# Patient Record
Sex: Female | Born: 1970
Health system: Southern US, Community
[De-identification: ages and names within clinical notes are randomized; demographics above are authoritative.]

## PROBLEM LIST (undated history)

## (undated) DIAGNOSIS — R739 Hyperglycemia, unspecified: Secondary | ICD-10-CM

## (undated) DIAGNOSIS — R0602 Shortness of breath: Secondary | ICD-10-CM

## (undated) DIAGNOSIS — E119 Type 2 diabetes mellitus without complications: Secondary | ICD-10-CM

## (undated) DIAGNOSIS — I639 Cerebral infarction, unspecified: Secondary | ICD-10-CM

## (undated) DIAGNOSIS — K8020 Calculus of gallbladder without cholecystitis without obstruction: Secondary | ICD-10-CM

## (undated) DIAGNOSIS — I10 Essential (primary) hypertension: Secondary | ICD-10-CM

## (undated) DIAGNOSIS — R600 Localized edema: Secondary | ICD-10-CM

## (undated) DIAGNOSIS — R059 Cough, unspecified: Secondary | ICD-10-CM

## (undated) DIAGNOSIS — F17210 Nicotine dependence, cigarettes, uncomplicated: Secondary | ICD-10-CM

## (undated) DIAGNOSIS — E7800 Pure hypercholesterolemia, unspecified: Secondary | ICD-10-CM

## (undated) DIAGNOSIS — R05 Cough: Secondary | ICD-10-CM

## (undated) DIAGNOSIS — I509 Heart failure, unspecified: Secondary | ICD-10-CM

## (undated) DIAGNOSIS — K219 Gastro-esophageal reflux disease without esophagitis: Secondary | ICD-10-CM

## (undated) DIAGNOSIS — J449 Chronic obstructive pulmonary disease, unspecified: Secondary | ICD-10-CM

## (undated) HISTORY — DX: Localized edema: R60.0

## (undated) HISTORY — PX: TUBAL LIGATION: SHX77

## (undated) HISTORY — DX: Cerebral infarction, unspecified: I63.9

## (undated) HISTORY — DX: Heart failure, unspecified: I50.9

## (undated) HISTORY — DX: Shortness of breath: R06.02

## (undated) HISTORY — DX: Cough: R05

## (undated) HISTORY — DX: Hyperglycemia, unspecified: R73.9

## (undated) HISTORY — DX: Chronic obstructive pulmonary disease, unspecified: J44.9

## (undated) HISTORY — DX: Cough, unspecified: R05.9

---

## 2015-11-13 ENCOUNTER — Encounter (HOSPITAL_COMMUNITY): Payer: Self-pay

## 2015-11-13 ENCOUNTER — Emergency Department (HOSPITAL_COMMUNITY)
Admission: EM | Admit: 2015-11-13 | Discharge: 2015-11-13 | Disposition: A | Discharge status: Home / Self Care | Payer: Self-pay | Attending: Emergency Medicine | Admitting: Emergency Medicine

## 2015-11-13 DIAGNOSIS — J0100 Acute maxillary sinusitis, unspecified: Secondary | ICD-10-CM | POA: Insufficient documentation

## 2015-11-13 DIAGNOSIS — E119 Type 2 diabetes mellitus without complications: Secondary | ICD-10-CM | POA: Insufficient documentation

## 2015-11-13 DIAGNOSIS — I10 Essential (primary) hypertension: Secondary | ICD-10-CM | POA: Insufficient documentation

## 2015-11-13 DIAGNOSIS — F17200 Nicotine dependence, unspecified, uncomplicated: Secondary | ICD-10-CM | POA: Insufficient documentation

## 2015-11-13 HISTORY — DX: Essential (primary) hypertension: I10

## 2015-11-13 HISTORY — DX: Type 2 diabetes mellitus without complications: E11.9

## 2015-11-13 HISTORY — DX: Pure hypercholesterolemia, unspecified: E78.00

## 2015-11-13 LAB — RAPID STREP SCREEN (MED CTR MEBANE ONLY): Streptococcus, Group A Screen (Direct): NEGATIVE

## 2015-11-13 MED ORDER — ALBUTEROL SULFATE HFA 108 (90 BASE) MCG/ACT IN AERS
2.0000 | INHALATION_SPRAY | Freq: Once | RESPIRATORY_TRACT | Status: AC
  Administered 2015-11-13: 2 via RESPIRATORY_TRACT
  Filled 2015-11-13: qty 6.7

## 2015-11-13 MED ORDER — AMOXICILLIN-POT CLAVULANATE 875-125 MG PO TABS: 1.0000 | ORAL_TABLET | Freq: Two times a day (BID) | ORAL | Status: DC

## 2015-11-13 MED ORDER — BENZONATATE 100 MG PO CAPS: 100.0000 mg | ORAL_CAPSULE | Freq: Three times a day (TID) | ORAL | Status: DC | PRN

## 2015-11-13 MED ORDER — FLUTICASONE PROPIONATE 50 MCG/ACT NA SUSP: 2.0000 | Freq: Every day | NASAL | Status: DC

## 2015-11-13 NOTE — Progress Notes (Signed)
[facility] [address]

## 2015-11-13 NOTE — Progress Notes (Signed)
Entered in d/c instructions  [facility] Schedule an appointment as soon as possible for a visit [facility] [address] [place] [phone]

## 2015-11-13 NOTE — ED Notes (Signed)
Pt dx with strep x 2 weeks ago.  Pt here today with sore throat and nasal congestion starting again this weekend.

## 2015-11-13 NOTE — Progress Notes (Signed)
EPIC updated  [facility] [address] [place]  [phone]

## 2015-11-13 NOTE — Discharge Instructions (Signed)
Read the information below.  Use the prescribed medication as directed.  Please discuss all new medications with your pharmacist.  You may return to the Emergency Department at any time for worsening condition or any new symptoms that concern you.   If you develop high fevers that do not resolve with tylenol or ibuprofen, you have difficulty swallowing or breathing, or you are unable to tolerate fluids by mouth, return to the ER for a recheck.      Sinusitis, Adult Sinusitis is redness, soreness, and puffiness (inflammation) of the air pockets in the bones of your face (sinuses). The redness, soreness, and puffiness can cause air and mucus to get trapped in your sinuses. This can allow germs to grow and cause an infection.  HOME CARE   Drink enough fluids to keep your pee (urine) clear or pale yellow.  Use a humidifier in your home.  Run a hot shower to create steam in the bathroom. Sit in the bathroom with the door closed. Breathe in the steam 3-4 times a day.  Put a warm, moist washcloth on your face 3-4 times a day, or as told by your doctor.  Use salt water sprays (saline sprays) to wet the thick fluid in your nose. This can help the sinuses drain.  Only take medicine as told by your doctor. GET HELP RIGHT AWAY IF:   Your pain gets worse.  You have very bad headaches.  You are sick to your stomach (nauseous).  You throw up (vomit).  You are very sleepy (drowsy) all the time.  Your face is puffy (swollen).  Your vision changes.  You have a stiff neck.  You have trouble breathing. MAKE SURE YOU:   Understand these instructions.  Will watch your condition.  Will get help right away if you are not doing well or get worse.   This information is not intended to replace advice given to you by your health care provider. Make sure you discuss any questions you have with your health care provider.   Document Released: 03/11/2008 Document Revised: 10/14/2014 Document Reviewed:  04/28/2012 Elsevier Interactive Patient Education 2016 Elsevier Inc.  Upper Respiratory Infection, Adult Most upper respiratory infections (URIs) are caused by a virus. A URI affects the nose, throat, and upper air passages. The most common type of URI is often called "the common cold." HOME CARE   Take medicines only as told by your doctor.  Gargle warm saltwater or take cough drops to comfort your throat as told by your doctor.  Use a warm mist humidifier or inhale steam from a shower to increase air moisture. This may make it easier to breathe.  Drink enough fluid to keep your pee (urine) clear or pale yellow.  Eat soups and other clear broths.  Have a healthy diet.  Rest as needed.  Go back to work when your fever is gone or your doctor says it is okay.  You may need to stay home longer to avoid giving your URI to others.  You can also wear a face mask and wash your hands often to prevent spread of the virus.  Use your inhaler more if you have asthma.  Do not use any tobacco products, including cigarettes, chewing tobacco, or electronic cigarettes. If you need help quitting, ask your doctor. GET HELP IF:  You are getting worse, not better.  Your symptoms are not helped by medicine.  You have chills.  You are getting more short of breath.  You have  brown or red mucus.  You have yellow or brown discharge from your nose.  You have pain in your face, especially when you bend forward.  You have a fever.  You have puffy (swollen) neck glands.  You have pain while swallowing.  You have white areas in the back of your throat. GET HELP RIGHT AWAY IF:   You have very bad or constant:  Headache.  Ear pain.  Pain in your forehead, behind your eyes, and over your cheekbones (sinus pain).  Chest pain.  You have long-lasting (chronic) lung disease and any of the following:  Wheezing.  Long-lasting cough.  Coughing up blood.  A change in your usual  mucus.  You have a stiff neck.  You have changes in your:  Vision.  Hearing.  Thinking.  Mood. MAKE SURE YOU:   Understand these instructions.  Will watch your condition.  Will get help right away if you are not doing well or get worse.   This information is not intended to replace advice given to you by your health care provider. Make sure you discuss any questions you have with your health care provider.   Document Released: 03/11/2008 Document Revised: 02/07/2015 Document Reviewed: 12/29/2013 Elsevier Interactive Patient Education 2016 [employer].    Emergency Department Resource Guide 1) Find a Doctor and Pay Out of Pocket Although you won't have to find out who is covered by your insurance plan, it is a good idea to ask around and get recommendations. You will then need to call the office and see if the doctor you have chosen will accept you as a new patient and what types of options they offer for patients who are self-pay. Some doctors offer discounts or will set up payment plans for their patients who do not have insurance, but you will need to ask so you aren't surprised when you get to your appointment.  2) Contact Your Local Health Department Not all health departments have doctors that can see patients for sick visits, but many do, so it is worth a call to see if yours does. If you don't know where your local health department is, you can check in your phone book. The CDC also has a tool to help you locate your state's health department, and many state websites also have listings of all of their local health departments.  3) Find a Walk-in Clinic If your illness is not likely to be very severe or complicated, you may want to try a walk in clinic. These are popping up all over the country in pharmacies, drugstores, and shopping centers. They're usually staffed by nurse practitioners or physician assistants that have been trained to treat common illnesses and  complaints. They're usually fairly quick and inexpensive. However, if you have serious medical issues or chronic medical problems, these are probably not your best option.  No Primary Care Doctor: - Call Health Connect at  [phone] - they can help you locate a primary care doctor that  accepts your insurance, provides certain services, etc. - Physician Referral Service- [phone]  Chronic Pain Problems: Organization         Address  Phone   Notes  [facility]  [phone] Patients need to be referred by their primary care doctor.   Medication Assistance: Organization         Address  Phone   Notes  [facility] Medication [facility] [address] [place] [place], [place] [address] [phone] --  Must be a resident of [facility] -- Must have NO insurance coverage whatsoever (no Medicaid/ Medicare, etc.) -- The pt. MUST have a primary care doctor that directs their care regularly and follows them in the community   [facility]  [phone]   [employer]  [phone]    Agencies that provide inexpensive medical care: Organization         Address  Phone   Notes  [facility]  [phone]   [facility]    [phone]   [facility] [address] [place], [place] [address] [phone]   [facility] [address] [place] [place] [phone]   Planned Parenthood    [phone]   [facility]    [phone]   [facility]  [address], [place] Phone:  [phone], Fax:  [phone] Hours of Operation:  9 am - 6 pm, M-F.  Also accepts Medicaid/Medicare and self-pay.  [facility]  [address], [place] Phone: [phone], Fax: [phone]. Hours of Operation:  8:30 am - 5:30 pm, M-F.  Also accepts Medicaid and self-pay.  [facility] [place] [address], [place] Phone: [phone]   [facility] [address] Natasha [place]ence [place], [place] [phone], Ext. [phone] Mondays & Thursdays: 7-9 AM.  First 15 patients are seen on a first come, first serve basis.    Medicaid-accepting [facility] Providers:  Organization         Address  Phone   Notes  [facility] [address], [place] [phone] Also accepts self-pay patients.  [facility] [address] Laurell Josephs [place], [place]  [phone]   [facility] [address], [place] [phone]   [facility] [address], [place] [phone]   Renaye Rakers [address], [place]   [phone] Only accepts [place] Access [place] patients after they have their name applied to their card.   Self-Pay (no insurance) in [facility]:  Organization         Address  Phone   Notes  Sickle Cell Patients, [facility] [address] [place], [place] [phone]   [facility] [address] [place], [place] [phone]   [facility] [place]  [address], [place] [phone]   [facility]/Dr. Osei-Bonsu  [address] [place] or [address], [place] [phone] Phone number for both [place] and [place] locations is the same.  [facility] [address], [place] [phone]   [facility] [address] [place] or [address] [phone]   [facility] [address], [place] [phone], phone; [phone], fax Sees patients 1st and 3rd Saturday of every month.  Must not qualify for public or private insurance (i.e. Medicaid, Medicare, [place] Health Choice, Veterans' Benefits)  Household income should be no more than 200% of the poverty level  The clinic cannot treat you if you are pregnant or think you are pregnant  Sexually transmitted diseases are not treated at the clinic.    Dental Care: Organization  Address  Phone  Notes  [facility] of [facility] [address] [place], [place] [phone] Accepts children up to age 26 who are enrolled in [place] or [place] Health Choice; pregnant women with a Medicaid card; and children who have applied for Medicaid or [place] Health Choice, but were declined, whose parents can pay a reduced fee at time of service.  [facility] Department of [facility]  [address], [place] [phone] Accepts children up to age 59 who are enrolled in [place] or [place] Health Choice; pregnant women with a Medicaid card; and children who have applied for Medicaid or [place] Health Choice, but were declined, whose parents can pay a reduced fee at time of service.  [facility]  [address] [place], [place] [phone] Patients are seen by appointment only. Walk-ins are not accepted. [facility] will see patients 14 years of age and older. Monday - Tuesday (8am-5pm) Most Wednesdays (8:30-5pm) $30 per visit, cash only  [facility] Adult Dental Access PROGRAM  [address], [facility] [phone] Patients are seen by appointment only. Walk-ins are not accepted. [facility] will see patients 44 years of age and older. One Wednesday Evening (Monthly: Volunteer Based).  $30 per visit, cash only  [employer]  [phone] for adults; Children under age 75, call Graduate Pediatric Dentistry at [phone]. Children aged 10-14, please call [phone] to request a pediatric application.  Dental services are provided in all areas of dental care including fillings, crowns and bridges, complete and partial dentures, implants, gum treatment, root canals, and extractions. Preventive care is  also provided. Treatment is provided to both adults and children. Patients are selected via a lottery and there is often a waiting list.   [facility] [address], [place]  [phone] [email or web address]   [facility] [address] [place] [address] [place], [place] [phone], Ext. [phone] Second and Fourth Thursday of each month, opens at 6:30 AM; Clinic ends at 9 AM.  Patients are seen on a first-come first-served basis, and a limited number are seen during each clinic.   [facility]  [address] Ether Griffins [place], [place] [phone]   Eligibility Requirements You must have lived in [place], [place], or [place] counties for at least the last three months.   You cannot be eligible for state or federal sponsored [place], including [employer], [place], or [employer].   You generally cannot be eligible for healthcare insurance through your employer.    How to apply: Eligibility screenings are held every Tuesday and Wednesday afternoon from 1:00 pm until 4:00 pm. You do not need an appointment for the interview!  [facility] [address] [place], [place] [phone]   [facility] Health Department  [phone]   [facility] Health Department  [phone]   [facility] Health Department  [phone]    Behavioral Health Resources in the Community: Intensive Outpatient Programs Organization         Address  Phone  Notes  [facility] [address] [place], [place] [phone]   [facility] Outpatient [address], [place], [place] [phone]   [facility] [address], [place], [place]  [phone]   [facility] Mental Health [address],  [place], [place] [phone] or [phone]   Substance Abuse Resources Organization  Address  Phone  Notes  [facility] Drug Services  [phone]   [facility]  [phone]   [facility]  [phone]   Floydene Flock  [phone]   Residential & Outpatient Substance Abuse Program  [phone]   Psychological Services Organization         Address  Phone  Notes  [facility]  [phone]   [facility] Services  [phone]   [facility] Mental Health [address] [place] [phone] or [phone]    Mobile Crisis Teams Organization         Address  Phone  Notes  [facility], Mobile Crisis Care Unit  [phone]   [facility]  [address] [place], [place] [phone]   Doristine Locks [address] [place] [phone]    Self-Help/Support Groups Organization         Address  Phone             Notes  [facility] [place] - variety of support groups  336- I7437963 Call for more information  Narcotics Anonymous (NA), [facility] [address], [employer] [place]  2 meetings at this location   Statistician         Address  Phone  Notes  [facility] [address],    [place]  [phone]   [facility]  [address] [place] [address], [place], [place] [phone]   [facility] [address] [place], [place] [phone] Admissions: 8am-3pm M-F  [facility] [address],    [place], [place] [phone]   [facility] [address] [place], [place], [place] [phone]   [facility] [address],  [place], [place] [phone]   [facility] - Intensive Outpatient [address] Laurell Josephs [address], [place], [place] [phone]   [facility] ([facility]iction [facility] [address] [place].,  [place], [place] [phone] or [phone]   [facility] ([facility]) [address] [place], [place] [phone] Accepts Medicaid  [facility] [address],  [place] [phone]  Substance Abuse/Addiction Treatment   [facility] Organization         Address  Phone  Notes  [facility]  [phone]   Angie Fava, PhD [address] Ervin Knack [place], [place]   [phone] or [phone]   [facility] Behavioral   [address] [place], [place] [phone]   [facility] [address] [place], [place] [phone] Insurance/Medicaid/sponsorship through [facility] and Families [address]                                    [place], [place] [phone] Therapy/tele-psych/case  [facility] [address] [place] [address], [place] [phone]    Dr. Lolly Mustache  [phone]   [facility]  [employer] [facility] Dept. 1) [address], [place] 2) [address], [place] 3)  [address], [place] [phone]  [phone]   [facility] Child Abuse Hotline [phone] or [phone] (After Hours)

## 2015-11-13 NOTE — ED Provider Notes (Signed)
CSN: [id number]     Arrival date & time 11/13/15  1403 History  By signing my name below, I, Misty Mccullough, attest that this documentation has been prepared under the direction and in the presence of Lucas Winograd, PA-C. Electronically Signed: Octavia Mccullough, ED Scribe. 11/13/2015. 4:00 PM.    Chief Complaint  Patient presents with  . Sore Throat  . Nasal Congestion      The history is provided by the patient. No language interpreter was used.   HPI Comments: Misty Mccullough is a 45 y.o. female who has a hx of HTN, DM, and hypercholesteremia presents to the Emergency Department complaining of constant, gradual worsening sore throat onset associated cough, sinus pressure, nasal congestion onset 9 days ago. She reports her cough in intermittent and she has difficulty breathing when she coughs excessively. She has been having sleeping at night due to cough. Pt reports she has been around her granddaughter who has been sick recently.  Pt was diagnosed with strep throat two weeks ago and reports feeling better until about 9 days ago.  Denies fever, chills, and body aches.  Past Medical History  Diagnosis Date  . Hypertension   . Diabetes mellitus without complication (HCC)   . Hypercholesteremia    Past Surgical History  Procedure Laterality Date  . Tubal ligation     History reviewed. No pertinent family history. Social History  Substance Use Topics  . Smoking status: Current Every Day Smoker  . Smokeless tobacco: None  . Alcohol Use: No   OB History    No data available     Review of Systems  Constitutional: Negative for fever and chills.  HENT: Positive for congestion, sinus pressure and sore throat.   Respiratory: Positive for cough.   Gastrointestinal: Negative for vomiting.  All other systems reviewed and are negative.     Allergies  Review of patient's allergies indicates no known allergies.  Home Medications   Prior to Admission medications   Not on File   Triage  vitals: BP 175/88 mmHg  Pulse 76  Temp(Src) 97.8 F (36.6 C) (Oral)  SpO2 95%  LMP 10/30/2015 Physical Exam  Constitutional: She appears well-developed and well-nourished. No distress.  HENT:  Head: Normocephalic and atraumatic.  Mouth/Throat: Oropharynx is clear and moist. No oropharyngeal exudate.  Erythema, bilateral maxillary sinus tenderness  Eyes: Conjunctivae are normal.  Neck: Neck supple.  Cardiovascular: Normal rate and regular rhythm.   Pulmonary/Chest: Effort normal and breath sounds normal. No respiratory distress. She has no wheezes. She has no rales.  Neurological: She is alert.  Skin: She is not diaphoretic.  Nursing note and vitals reviewed.   ED Course  Procedures  DIAGNOSTIC STUDIES: Oxygen Saturation is 95% on RA, adequate by my interpretation.  COORDINATION OF CARE:  3:57 PM Discussed treatment plan with pt at bedside and pt agreed to plan.  Labs Review Labs Reviewed  RAPID STREP SCREEN (NOT AT [facility])  CULTURE, GROUP A STREP [facility])    Imaging Review No results found. I have personally reviewed and evaluated these images and lab results as part of my medical decision-making.   EKG Interpretation None      MDM   Final diagnoses:  Acute maxillary sinusitis, recurrence not specified    Afebrile, nontoxic patient with constellation of symptoms suggestive of viral syndrome x 9 days with maxillary sinus pain and tenderness.  Pt is diabetic and blood sugars reported to be high.  D/C home with Augmentin, Tessalon, Flonase, PCP follow  up.  Discussed result, findings, treatment, and follow up  with patient.  Pt given return precautions.  Pt verbalizes understanding and agrees with plan.       I personally performed the services described in this documentation, which was scribed in my presence. The recorded information has been reviewed and is accurate.   Trixie Dredge, PA-C 11/13/15 1747  Arby Barrette, MD 11/20/15 [phone]

## 2015-11-16 LAB — CULTURE, GROUP A STREP (THRC)

## 2016-06-26 DIAGNOSIS — Z79899 Other long term (current) drug therapy: Secondary | ICD-10-CM | POA: Insufficient documentation

## 2016-06-26 DIAGNOSIS — E1143 Type 2 diabetes mellitus with diabetic autonomic (poly)neuropathy: Secondary | ICD-10-CM | POA: Insufficient documentation

## 2016-06-26 DIAGNOSIS — I10 Essential (primary) hypertension: Secondary | ICD-10-CM | POA: Insufficient documentation

## 2016-06-26 DIAGNOSIS — F17200 Nicotine dependence, unspecified, uncomplicated: Secondary | ICD-10-CM | POA: Insufficient documentation

## 2016-06-26 DIAGNOSIS — Z760 Encounter for issue of repeat prescription: Secondary | ICD-10-CM | POA: Insufficient documentation

## 2016-06-26 LAB — CBG MONITORING, ED: Glucose-Capillary: 201 mg/dL — ABNORMAL HIGH (ref 65–99)

## 2016-06-26 NOTE — ED Triage Notes (Signed)
Pt states that she is diabetic and has started to have neuropathy in her feet. CBG 201. Alert and oriented.

## 2016-06-27 ENCOUNTER — Emergency Department (HOSPITAL_COMMUNITY)
Admission: EM | Admit: 2016-06-27 | Discharge: 2016-06-27 | Disposition: A | Discharge status: Home / Self Care | Payer: Self-pay | Attending: Emergency Medicine | Admitting: Emergency Medicine

## 2016-06-27 ENCOUNTER — Emergency Department (HOSPITAL_COMMUNITY): Payer: Self-pay

## 2016-06-27 DIAGNOSIS — Z760 Encounter for issue of repeat prescription: Secondary | ICD-10-CM

## 2016-06-27 DIAGNOSIS — E0842 Diabetes mellitus due to underlying condition with diabetic polyneuropathy: Secondary | ICD-10-CM

## 2016-06-27 MED ORDER — CIPROFLOXACIN HCL 500 MG PO TABS: 500.0000 mg | ORAL_TABLET | Freq: Two times a day (BID) | ORAL | 0 refills | Status: DC

## 2016-06-27 MED ORDER — METFORMIN HCL 500 MG PO TABS: 500.0000 mg | ORAL_TABLET | Freq: Two times a day (BID) | ORAL | 0 refills | Status: DC

## 2016-06-27 MED ORDER — METFORMIN HCL 500 MG PO TABS
500.0000 mg | ORAL_TABLET | Freq: Once | ORAL | Status: AC
  Administered 2016-06-27: 500 mg via ORAL
  Filled 2016-06-27: qty 1

## 2016-06-27 NOTE — ED Notes (Signed)
Patient was not in room when nurse went to discharge the patient.

## 2016-06-27 NOTE — ED Notes (Signed)
Patient states she is a diabetic and that she thinks that she has neuropathy. Patient does not have any sores or wounds on feet. Patient feet is not swollen. She states that her feet feel like they are burning.

## 2016-06-27 NOTE — ED Provider Notes (Addendum)
[facility]-EMERGENCY DEPT Provider Note   CSN: [id number] Arrival date & time: 06/26/16  1928  By signing my name below, I, Suzan Slick. Elon Spanner, attest that this documentation has been prepared under the direction and in the presence of Greidys Deland, MD.  Electronically Signed: Suzan Slick. Elon Spanner, ED Scribe. 06/27/16. 1:14 AM.    History   Chief Complaint Chief Complaint  Patient presents with  . Foot Pain   The history is provided by the patient. No language interpreter was used.  Foot Pain  This is a chronic problem. The current episode started more than 1 week ago. The problem occurs constantly. The problem has been gradually worsening. Pertinent negatives include no chest pain. The symptoms are aggravated by walking and standing. Nothing relieves the symptoms. She has tried nothing for the symptoms.    HPI Comments: Misty Mccullough is a 45 y.o. female with a PMHx of uncontrolled DM, neuropathy, and HTN who presents to the Emergency Department complaining of constant, worsening R foot pain x 3-4 months. Pt denies any recent injury or trauma. Discomfort to foot is exacerbated with ambulation. No alleviating factors at this time. No OTC medications or home remedies attempted prior to arrival. No recent fever, chills, nausea, or vomiting. No numbness to foot. Pt states she originally followed with a foot doctor at the [facility] in [place] but has not been seen in 3 years. She states she recently moved to [facility] and has not established with a new provider yet.  PCP: PROVIDER NOT IN SYSTEM    Past Medical History:  Diagnosis Date  . Diabetes mellitus without complication (HCC)   . Hypercholesteremia   . Hypertension     There are no active problems to display for this patient.   Past Surgical History:  Procedure Laterality Date  . TUBAL LIGATION      OB History    No data available       Home Medications    Prior to Admission medications   Medication Sig Start Date End Date Taking?  Authorizing Provider  amoxicillin-clavulanate (AUGMENTIN) 875-125 MG tablet Take 1 tablet by mouth every 12 (twelve) hours. 11/13/15   Trixie Dredge, PA-C  benzonatate (TESSALON) 100 MG capsule Take 1 capsule (100 mg total) by mouth 3 (three) times daily as needed for cough. 11/13/15   Trixie Dredge, PA-C  fluticasone (FLONASE) 50 MCG/ACT nasal spray Place 2 sprays into both nostrils daily. 11/13/15   Trixie Dredge, PA-C    Family History No family history on file.  Social History Social History  Substance Use Topics  . Smoking status: Current Every Day Smoker  . Smokeless tobacco: Not on file  . Alcohol use No     Allergies   Review of patient's allergies indicates no known allergies.   Review of Systems Review of Systems  Constitutional: Negative for chills and fever.  Cardiovascular: Negative for chest pain.  Gastrointestinal: Negative for nausea and vomiting.  Musculoskeletal: Positive for arthralgias.  Psychiatric/Behavioral: Negative for confusion.  All other systems reviewed and are negative.    Physical Exam Updated Vital Signs BP 167/100 (BP Location: Right Arm)   Pulse 82   Temp 98.2 F (36.8 C) (Oral)   Resp 18   LMP 05/31/2016 (Approximate)   SpO2 100%   Physical Exam  Constitutional: She is oriented to person, place, and time. She appears well-developed and well-nourished.  HENT:  Head: Normocephalic.  Mouth/Throat: Oropharynx is clear and moist.  No lesion noted to mouth. Multiple caries  noted to mouth.  Eyes: EOM are normal. Pupils are equal, round, and reactive to light.  Neck: Normal range of motion.  Cardiovascular: Normal rate, regular rhythm, normal heart sounds and intact distal pulses.  Exam reveals no gallop and no friction rub.   No murmur heard. DP pulse intact to R foot. PT pulse intact to R foot.  Pulmonary/Chest: Effort normal and breath sounds normal. No respiratory distress.  Abdominal: Soft. Bowel sounds are normal. She exhibits no distension and  no mass. There is no tenderness. There is no rebound and no guarding.  Musculoskeletal: Normal range of motion.       Feet:  Darkened impression the exact size of a dime noted between the 4th and 5th metatarsal. No break in skin.  Neurological: She is alert and oriented to person, place, and time. She has normal reflexes.  Skin: Skin is warm and dry. Capillary refill takes less than 2 seconds.  Psychiatric: She has a normal mood and affect.  Nursing note and vitals reviewed.    ED Treatments / Results   DIAGNOSTIC STUDIES: Oxygen Saturation is 100% on RA, Normal by my interpretation.    COORDINATION OF CARE: 1:11 AM- Will give Metformin. Will order blood work and imaging. Discussed treatment plan with pt at bedside and pt agreed to plan.    Vitals:   06/26/16 1943 06/27/16 0038  BP: 167/100 164/88  Pulse: 82 80  Resp: 18 13  Temp: 98.2 F (36.8 C) 97.5 F (36.4 C)    Labs Results for orders placed or performed during the hospital encounter of 06/27/16  CBG monitoring, ED  Result Value Ref Range   Glucose-Capillary 201 (H) 65 - 99 mg/dL   Dg Foot Complete Right  Result Date: 06/27/2016 CLINICAL DATA:  Anterior foot pain for 4 months. EXAM: RIGHT FOOT COMPLETE - 3+ VIEW COMPARISON:  None. FINDINGS: There is no evidence of fracture or dislocation. There is no evidence of arthropathy or other focal bone abnormality. Soft tissues are unremarkable. 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 IMPRESSION: No acute bony abnormalities. Electronically Signed   By: Burman NievesWilliam  Stevens M.D.   On: 06/27/2016 02:20    Radiology No results found.  Procedures Procedures (including critical care time)  Medications Ordered in ED Medications - No data to display  Will refill metformin and cover patient with cipro given length of exposure to to coin on sole of the foot.  No ulcerations.  Will refer to new pmd and podiatry for ongoing diabetic foot care.    Initial Impression /  Assessment and Plan / ED Course  I have reviewed the triage vital signs and the nursing notes.  Pertinent labs & imaging results that were available during my care of the patient were reviewed by me and considered in my medical decision making (see chart for details).   All questions answered to patient's satisfaction. Based on history and exam patient has been appropriately medically screened and emergency conditions excluded. Patient is stable for discharge at this time. Follow up with your PMD for recheck in 2 days and strict return precautions given    Final Clinical Impressions(s) / ED Diagnoses   Final diagnoses:  None    New Prescriptions New Prescriptions   No medications on file     Mamye Bolds, MD 06/27/16 0254    Ashtyn Freilich, MD 06/27/16 [phone]

## 2016-08-12 ENCOUNTER — Encounter (HOSPITAL_COMMUNITY): Payer: Self-pay | Admitting: Emergency Medicine

## 2016-08-12 ENCOUNTER — Emergency Department (HOSPITAL_COMMUNITY): Payer: Self-pay

## 2016-08-12 DIAGNOSIS — J219 Acute bronchiolitis, unspecified: Secondary | ICD-10-CM | POA: Insufficient documentation

## 2016-08-12 DIAGNOSIS — Z8049 Family history of malignant neoplasm of other genital organs: Secondary | ICD-10-CM | POA: Insufficient documentation

## 2016-08-12 DIAGNOSIS — E119 Type 2 diabetes mellitus without complications: Secondary | ICD-10-CM | POA: Insufficient documentation

## 2016-08-12 DIAGNOSIS — M40204 Unspecified kyphosis, thoracic region: Secondary | ICD-10-CM | POA: Insufficient documentation

## 2016-08-12 DIAGNOSIS — Z8249 Family history of ischemic heart disease and other diseases of the circulatory system: Secondary | ICD-10-CM | POA: Insufficient documentation

## 2016-08-12 DIAGNOSIS — M549 Dorsalgia, unspecified: Secondary | ICD-10-CM | POA: Insufficient documentation

## 2016-08-12 DIAGNOSIS — F17210 Nicotine dependence, cigarettes, uncomplicated: Secondary | ICD-10-CM | POA: Insufficient documentation

## 2016-08-12 DIAGNOSIS — E7800 Pure hypercholesterolemia, unspecified: Secondary | ICD-10-CM | POA: Insufficient documentation

## 2016-08-12 DIAGNOSIS — Z7984 Long term (current) use of oral hypoglycemic drugs: Secondary | ICD-10-CM | POA: Insufficient documentation

## 2016-08-12 DIAGNOSIS — I10 Essential (primary) hypertension: Secondary | ICD-10-CM | POA: Insufficient documentation

## 2016-08-12 DIAGNOSIS — Z823 Family history of stroke: Secondary | ICD-10-CM | POA: Insufficient documentation

## 2016-08-12 DIAGNOSIS — Z833 Family history of diabetes mellitus: Secondary | ICD-10-CM | POA: Insufficient documentation

## 2016-08-12 DIAGNOSIS — M8588 Other specified disorders of bone density and structure, other site: Secondary | ICD-10-CM | POA: Insufficient documentation

## 2016-08-12 DIAGNOSIS — R072 Precordial pain: Principal | ICD-10-CM | POA: Insufficient documentation

## 2016-08-12 DIAGNOSIS — R200 Anesthesia of skin: Secondary | ICD-10-CM | POA: Insufficient documentation

## 2016-08-12 LAB — BASIC METABOLIC PANEL
ANION GAP: 9 (ref 5–15)
BUN: 7 mg/dL (ref 6–20)
CALCIUM: 9.1 mg/dL (ref 8.9–10.3)
CO2: 23 mmol/L (ref 22–32)
CREATININE: 0.55 mg/dL (ref 0.44–1.00)
Chloride: 104 mmol/L (ref 101–111)
Glucose, Bld: 149 mg/dL — ABNORMAL HIGH (ref 65–99)
Potassium: 3.5 mmol/L (ref 3.5–5.1)
Sodium: 136 mmol/L (ref 135–145)

## 2016-08-12 LAB — I-STAT TROPONIN, ED: TROPONIN I, POC: 0 ng/mL (ref 0.00–0.08)

## 2016-08-12 LAB — CBC
HCT: 40.2 % (ref 36.0–46.0)
HEMOGLOBIN: 13.8 g/dL (ref 12.0–15.0)
MCH: 29.4 pg (ref 26.0–34.0)
MCHC: 34.3 g/dL (ref 30.0–36.0)
MCV: 85.5 fL (ref 78.0–100.0)
PLATELETS: 239 10*3/uL (ref 150–400)
RBC: 4.7 MIL/uL (ref 3.87–5.11)
RDW: 14.6 % (ref 11.5–15.5)
WBC: 6.2 10*3/uL (ref 4.0–10.5)

## 2016-08-12 NOTE — ED Triage Notes (Signed)
Pt presents to ED for assessment of back pain x3 weeks.  Pt c/o "rib pain", back pain, chest pain and shortness of breath.  Pt c/o increase in chest pressure x 3 days.  NAD at triage.  Hx of DM.

## 2016-08-13 ENCOUNTER — Observation Stay (HOSPITAL_BASED_OUTPATIENT_CLINIC_OR_DEPARTMENT_OTHER): Payer: Self-pay

## 2016-08-13 ENCOUNTER — Observation Stay (HOSPITAL_COMMUNITY)
Admission: EM | Admit: 2016-08-13 | Discharge: 2016-08-14 | Disposition: A | Discharge status: Home / Self Care | Payer: Self-pay | Attending: Internal Medicine | Admitting: Internal Medicine

## 2016-08-13 ENCOUNTER — Encounter (HOSPITAL_COMMUNITY): Payer: Self-pay | Admitting: Family Medicine

## 2016-08-13 ENCOUNTER — Observation Stay (HOSPITAL_COMMUNITY): Payer: Self-pay

## 2016-08-13 DIAGNOSIS — E118 Type 2 diabetes mellitus with unspecified complications: Secondary | ICD-10-CM

## 2016-08-13 DIAGNOSIS — I10 Essential (primary) hypertension: Secondary | ICD-10-CM

## 2016-08-13 DIAGNOSIS — M549 Dorsalgia, unspecified: Secondary | ICD-10-CM

## 2016-08-13 DIAGNOSIS — E785 Hyperlipidemia, unspecified: Secondary | ICD-10-CM

## 2016-08-13 DIAGNOSIS — R079 Chest pain, unspecified: Secondary | ICD-10-CM | POA: Diagnosis present

## 2016-08-13 DIAGNOSIS — E440 Moderate protein-calorie malnutrition: Secondary | ICD-10-CM | POA: Insufficient documentation

## 2016-08-13 LAB — NM MYOCAR MULTI W/SPECT W/WALL MOTION / EF
CHL CUP NUCLEAR SRS: 0
CHL CUP NUCLEAR SSS: 2
CHL CUP RESTING HR STRESS: 76 {beats}/min
Exercise duration (min): 4 min
LVDIAVOL: 82 mL (ref 46–106)
LVSYSVOL: 32 mL
RATE: 0.68
SDS: 2
TID: 1.19

## 2016-08-13 LAB — HEMOGLOBIN A1C
Hgb A1c MFr Bld: 7.3 % — ABNORMAL HIGH (ref 4.8–5.6)
Mean Plasma Glucose: 163 mg/dL

## 2016-08-13 LAB — TROPONIN I: Troponin I: <0.03 ng/mL (ref <0.03)

## 2016-08-13 LAB — GLUCOSE, CAPILLARY
GLUCOSE-CAPILLARY: 295 mg/dL — AB (ref 65–99)
GLUCOSE-CAPILLARY: 157 mg/dL — AB (ref 65–99)
Glucose-Capillary: 113 mg/dL — ABNORMAL HIGH (ref 65–99)

## 2016-08-13 LAB — I-STAT TROPONIN, ED: Troponin i, poc: 0 ng/mL (ref 0.00–0.08)

## 2016-08-13 LAB — LIPID PANEL
Cholesterol: 192 mg/dL (ref 0–200)
HDL: 41 mg/dL (ref >40)
LDL Cholesterol: 138 mg/dL — ABNORMAL HIGH (ref 0–99)
TRIGLYCERIDES: 64 mg/dL (ref <150)
Total CHOL/HDL Ratio: 4.7 RATIO
VLDL: 13 mg/dL (ref 0–40)

## 2016-08-13 LAB — CBG MONITORING, ED: GLUCOSE-CAPILLARY: 110 mg/dL — AB (ref 65–99)

## 2016-08-13 MED ORDER — TECHNETIUM TC 99M TETROFOSMIN IV KIT
30.0000 | PACK | Freq: Once | INTRAVENOUS | Status: AC | PRN
  Administered 2016-08-13: 30 via INTRAVENOUS

## 2016-08-13 MED ORDER — MORPHINE SULFATE (PF) 4 MG/ML IV SOLN
4.0000 mg | Freq: Once | INTRAVENOUS | Status: AC
  Administered 2016-08-13: 4 mg via INTRAVENOUS
  Filled 2016-08-13: qty 1

## 2016-08-13 MED ORDER — ONDANSETRON HCL 4 MG/2ML IJ SOLN: 4.0000 mg | Freq: Four times a day (QID) | INTRAMUSCULAR | Status: DC | PRN

## 2016-08-13 MED ORDER — ONDANSETRON HCL 4 MG/2ML IJ SOLN
4.0000 mg | Freq: Once | INTRAMUSCULAR | Status: AC
  Administered 2016-08-13: 4 mg via INTRAVENOUS
  Filled 2016-08-13: qty 2

## 2016-08-13 MED ORDER — ONDANSETRON HCL 4 MG/2ML IJ SOLN: 4.0000 mg | Freq: Three times a day (TID) | INTRAMUSCULAR | Status: DC | PRN

## 2016-08-13 MED ORDER — PANTOPRAZOLE SODIUM 40 MG PO TBEC
40.0000 mg | DELAYED_RELEASE_TABLET | Freq: Every day | ORAL | Status: DC
  Administered 2016-08-13 – 2016-08-14 (×2): 40 mg via ORAL
  Filled 2016-08-13 (×2): qty 1

## 2016-08-13 MED ORDER — ONDANSETRON HCL 4 MG PO TABS: 4.0000 mg | ORAL_TABLET | Freq: Four times a day (QID) | ORAL | Status: DC | PRN

## 2016-08-13 MED ORDER — REGADENOSON 0.4 MG/5ML IV SOLN
0.4000 mg | Freq: Once | INTRAVENOUS | Status: AC
  Administered 2016-08-13: 0.4 mg via INTRAVENOUS

## 2016-08-13 MED ORDER — ACETAMINOPHEN 325 MG PO TABS: 650.0000 mg | ORAL_TABLET | Freq: Four times a day (QID) | ORAL | Status: DC | PRN

## 2016-08-13 MED ORDER — OXYCODONE HCL 5 MG PO TABS
5.0000 mg | ORAL_TABLET | Freq: Four times a day (QID) | ORAL | Status: DC | PRN
  Administered 2016-08-13 – 2016-08-14 (×2): 5 mg via ORAL
  Filled 2016-08-13 (×2): qty 1

## 2016-08-13 MED ORDER — GI COCKTAIL ~~LOC~~: 30.0000 mL | Freq: Four times a day (QID) | ORAL | Status: DC | PRN

## 2016-08-13 MED ORDER — INSULIN ASPART 100 UNIT/ML ~~LOC~~ SOLN: 0.0000 [IU] | Freq: Every day | SUBCUTANEOUS | Status: DC

## 2016-08-13 MED ORDER — ALBUTEROL SULFATE (2.5 MG/3ML) 0.083% IN NEBU: 2.5000 mg | INHALATION_SOLUTION | RESPIRATORY_TRACT | Status: AC | PRN

## 2016-08-13 MED ORDER — INSULIN ASPART 100 UNIT/ML ~~LOC~~ SOLN
0.0000 [IU] | Freq: Three times a day (TID) | SUBCUTANEOUS | Status: DC
  Administered 2016-08-13: 5 [IU] via SUBCUTANEOUS
  Administered 2016-08-14: 1 [IU] via SUBCUTANEOUS

## 2016-08-13 MED ORDER — HYDRALAZINE HCL 20 MG/ML IJ SOLN
5.0000 mg | INTRAMUSCULAR | Status: DC | PRN
  Administered 2016-08-13: 10 mg via INTRAVENOUS
  Filled 2016-08-13: qty 1

## 2016-08-13 MED ORDER — SODIUM CHLORIDE 0.9% FLUSH: 3.0000 mL | Freq: Two times a day (BID) | INTRAVENOUS | Status: DC

## 2016-08-13 MED ORDER — ENOXAPARIN SODIUM 40 MG/0.4ML ~~LOC~~ SOLN: 40.0000 mg | SUBCUTANEOUS | Status: DC

## 2016-08-13 MED ORDER — NITROGLYCERIN 0.4 MG SL SUBL
0.4000 mg | SUBLINGUAL_TABLET | SUBLINGUAL | Status: DC | PRN
  Filled 2016-08-13: qty 1

## 2016-08-13 MED ORDER — METHOCARBAMOL 1000 MG/10ML IJ SOLN
500.0000 mg | Freq: Four times a day (QID) | INTRAVENOUS | Status: DC | PRN
  Administered 2016-08-13: 500 mg via INTRAVENOUS
  Filled 2016-08-13 (×3): qty 5

## 2016-08-13 MED ORDER — ACETAMINOPHEN 650 MG RE SUPP: 650.0000 mg | Freq: Four times a day (QID) | RECTAL | Status: DC | PRN

## 2016-08-13 MED ORDER — KETOROLAC TROMETHAMINE 30 MG/ML IJ SOLN
30.0000 mg | Freq: Three times a day (TID) | INTRAMUSCULAR | Status: DC | PRN
  Administered 2016-08-13 – 2016-08-14 (×2): 30 mg via INTRAVENOUS
  Filled 2016-08-13 (×2): qty 1

## 2016-08-13 MED ORDER — SODIUM CHLORIDE 0.9 % IV BOLUS (SEPSIS)
500.0000 mL | Freq: Once | INTRAVENOUS | Status: AC
  Administered 2016-08-13: 500 mL via INTRAVENOUS

## 2016-08-13 MED ORDER — REGADENOSON 0.4 MG/5ML IV SOLN
INTRAVENOUS | Status: AC
  Filled 2016-08-13: qty 5

## 2016-08-13 MED ORDER — PRAVASTATIN SODIUM 20 MG PO TABS
20.0000 mg | ORAL_TABLET | Freq: Every day | ORAL | Status: DC
  Administered 2016-08-13: 20 mg via ORAL
  Filled 2016-08-13: qty 1

## 2016-08-13 MED ORDER — SODIUM CHLORIDE 0.45 % IV SOLN
INTRAVENOUS | Status: DC
  Administered 2016-08-13: 16:00:00 via INTRAVENOUS

## 2016-08-13 MED ORDER — LISINOPRIL 10 MG PO TABS
10.0000 mg | ORAL_TABLET | Freq: Every day | ORAL | Status: DC
  Administered 2016-08-13 – 2016-08-14 (×2): 10 mg via ORAL
  Filled 2016-08-13 (×2): qty 1

## 2016-08-13 MED ORDER — ENOXAPARIN SODIUM 40 MG/0.4ML ~~LOC~~ SOLN
40.0000 mg | SUBCUTANEOUS | Status: DC
  Administered 2016-08-13: 40 mg via SUBCUTANEOUS
  Filled 2016-08-13: qty 0.4

## 2016-08-13 MED ORDER — TECHNETIUM TC 99M TETROFOSMIN IV KIT
10.0000 | PACK | Freq: Once | INTRAVENOUS | Status: AC | PRN
  Administered 2016-08-13: 10 via INTRAVENOUS

## 2016-08-13 MED ORDER — ASPIRIN 81 MG PO CHEW
324.0000 mg | CHEWABLE_TABLET | Freq: Once | ORAL | Status: AC
  Administered 2016-08-13: 324 mg via ORAL
  Filled 2016-08-13: qty 4

## 2016-08-13 NOTE — ED Notes (Signed)
Patient was outside smoking, security brought patient back inside and sent back to room.

## 2016-08-13 NOTE — ED Provider Notes (Signed)
[facility]-EMERGENCY DEPT Provider Note   CSN: [id number] Arrival date & time: 08/12/16  2053     History   Chief Complaint Chief Complaint  Patient presents with  . Chest Pain  . Back Pain  . Shortness of Breath    HPI Misty Mccullough is a 45 y.o. female.  The history is provided by the patient. No language interpreter was used.  Chest Pain   This is a new problem. The current episode started more than 1 week ago. The problem occurs daily. The problem has been rapidly worsening. The pain is associated with exertion and movement. The pain is present in the substernal region. The pain is at a severity of 10/10. The pain is severe. The quality of the pain is described as pressure-like (tightness). The pain does not radiate. Duration of episode(s) is 10 minutes. The symptoms are aggravated by exertion and certain positions. Associated symptoms include back pain, cough (no change from baseline "smokers cough"), exertional chest pressure, malaise/fatigue, shortness of breath, sputum production (clear sputum) and weakness. Pertinent negatives include no abdominal pain, no claudication, no diaphoresis, no dizziness, no fever, no headaches, no hemoptysis, no irregular heartbeat, no leg pain, no lower extremity edema, no nausea, no near-syncope, no numbness, no orthopnea, no palpitations, no PND, no syncope and no vomiting. She has tried nothing for the symptoms. Risk factors include smoking/tobacco exposure.  Her past medical history is significant for diabetes, hyperlipidemia and hypertension.  Her family medical history is significant for CAD and stroke.  Procedure history is negative for echocardiogram and exercise treadmill test.  Back Pain   This is a recurrent problem. The current episode started more than 1 week ago (three weeks). The problem has not changed since onset.The pain is associated with no known injury. The pain is present in the thoracic spine and lumbar spine. The quality of the pain is  described as stabbing and shooting. The pain does not radiate. The pain is severe. The symptoms are aggravated by bending, twisting and certain positions. The pain is the same all the time. Associated symptoms include chest pain and weakness. Pertinent negatives include no fever, no numbness, no weight loss, no headaches, no abdominal pain, no abdominal swelling, no bowel incontinence, no perianal numbness, no bladder incontinence, no dysuria, no pelvic pain, no leg pain, no paresthesias, no paresis and no tingling. She has tried nothing for the symptoms. The treatment provided no relief.  Shortness of Breath  Associated symptoms include cough (no change from baseline "smokers cough"), sputum production (clear sputum) and chest pain. Pertinent negatives include no fever, no headaches, no hemoptysis, no PND, no orthopnea, no syncope, no vomiting, no abdominal pain, no leg pain and no claudication.    Patient is a 89 -year-old female with history of diabetes, hypercholesteremia, hypertension, current smoker, who presents emergency Department complaining of central chest pain described as tightness and pressure. She also complains of back pain that is up and down her back. Her back pain is been ongoing for 3 weeks and her chest pain has rapidly worsened over the past week.  Her chest pain is exertional and sometimes reproduced with certain movements such as bending over. She states when the pain comes she has shortness of breath and also pain with inspiration. Episodes have become more severe and more frequent over the past week, they last 10-15 minutes at a time and she states there is no alleviating factors she just "waits for them to pass."  She denies any other associated  symptoms with her chest pain including no near-syncope, orthopnea, PND, palpitations, diaphoresis, nausea or vomiting.  She has a mild intermittent cough at her baseline which is unchanged, she occasionally has clear sputum production but she  denies any worsening cough and she denies wheeze, fever or night sweats. She has been out of all her medications except metformin, for the past 6 months.  She was previously on insulin. She denies any personal cardiac history. She denies any stress test in the past or cardiac events. She endorses family history of stroke and cardiac disease.  She denies any illegal drug or alcohol abuse.    Past Medical History:  Diagnosis Date  . Diabetes mellitus without complication (HCC)   . Hypercholesteremia   . Hypertension     Patient Active Problem List   Diagnosis Date Noted  . Chest pain 08/13/2016    Past Surgical History:  Procedure Laterality Date  . TUBAL LIGATION      OB History    No data available       Home Medications    Prior to Admission medications   Medication Sig Start Date End Date Taking? Authorizing Provider  metFORMIN (GLUCOPHAGE) 500 MG tablet Take 1 tablet (500 mg total) by mouth 2 (two) times daily with a meal. 06/27/16  Yes April Palumbo, MD  amoxicillin-clavulanate (AUGMENTIN) 875-125 MG tablet Take 1 tablet by mouth every 12 (twelve) hours. Patient not taking: Reported on 08/13/2016 11/13/15   Trixie DredgeEmily West, PA-C  benzonatate (TESSALON) 100 MG capsule Take 1 capsule (100 mg total) by mouth 3 (three) times daily as needed for cough. Patient not taking: Reported on 08/13/2016 11/13/15   Trixie DredgeEmily West, PA-C  ciprofloxacin (CIPRO) 500 MG tablet Take 1 tablet (500 mg total) by mouth 2 (two) times daily. Patient not taking: Reported on 08/13/2016 06/27/16   April Palumbo, MD  fluticasone [facility](FLONASE) 50 MCG/ACT nasal spray Place 2 sprays into both nostrils daily. Patient not taking: Reported on 08/13/2016 11/13/15   Trixie DredgeEmily West, PA-C    Family History History reviewed. No pertinent family history.  Social History Social History  Substance Use Topics  . Smoking status: Current Every Day Smoker    Packs/day: 1.00  . Smokeless tobacco: Never Used  . Alcohol use No      Allergies   Patient has no known allergies.   Review of Systems Review of Systems  Constitutional: Positive for malaise/fatigue. Negative for diaphoresis, fever and weight loss.  Respiratory: Positive for cough (no change from baseline "smokers cough"), sputum production (clear sputum) and shortness of breath. Negative for hemoptysis.   Cardiovascular: Positive for chest pain. Negative for palpitations, orthopnea, claudication, syncope, PND and near-syncope.  Gastrointestinal: Negative for abdominal pain, bowel incontinence, nausea and vomiting.  Genitourinary: Negative for bladder incontinence, dysuria and pelvic pain.  Musculoskeletal: Positive for back pain.  Neurological: Positive for weakness. Negative for dizziness, tingling, numbness, headaches and paresthesias.  All other systems reviewed and are negative.    Physical Exam Updated Vital Signs BP 166/95   Pulse 76   Temp 97.8 F (36.6 C) (Oral)   Resp 15   Ht 5\' 4"  (1.626 m)   Wt 61.2 kg   LMP 07/30/2016   SpO2 97%   BMI 23.17 kg/m   Physical Exam  Constitutional: She is oriented to person, place, and time. She appears well-developed and well-nourished. No distress.  HENT:  Head: Normocephalic and atraumatic.  Right Ear: External ear normal.  Left Ear: External ear normal.  Nose:  Nose normal.  Mouth/Throat: Oropharynx is clear and moist. No oropharyngeal exudate.  Eyes: Conjunctivae and EOM are normal. Pupils are equal, round, and reactive to light. Right eye exhibits no discharge. Left eye exhibits no discharge. No scleral icterus.  Neck: Normal range of motion. Neck supple. No JVD present.  Cardiovascular: Normal rate, regular rhythm, normal heart sounds and intact distal pulses.  Exam reveals no gallop and no friction rub.   No murmur heard. Pulmonary/Chest: Effort normal and breath sounds normal. No stridor. No respiratory distress. She has no wheezes. She has no rales. She exhibits no tenderness.   Abdominal: Soft. Bowel sounds are normal. She exhibits no distension and no mass. There is no tenderness. There is no guarding.  Musculoskeletal: Normal range of motion. She exhibits no edema.  Lymphadenopathy:    She has no cervical adenopathy.  Neurological: She is alert and oriented to person, place, and time. She exhibits normal muscle tone. Coordination normal.  Skin: Skin is warm and dry. Capillary refill takes less than 2 seconds. No rash noted. She is not diaphoretic. No erythema. No pallor.  Psychiatric: She has a normal mood and affect. Her behavior is normal. Judgment and thought content normal.  Nursing note and vitals reviewed.    ED Treatments / Results  Labs (all labs ordered are listed, but only abnormal results are displayed) Labs Reviewed  BASIC METABOLIC PANEL - Abnormal; Notable for the following:       Result Value   Glucose, Bld 149 (*)    All other components within normal limits  CBC  I-STAT TROPOININ, ED  I-STAT TROPOININ, ED    EKG  EKG Interpretation  Date/Time:  Monday August 12 2016 21:06:54 EST Ventricular Rate:  87 PR Interval:  132 QRS Duration: 76 QT Interval:  380 QTC Calculation: 457 R Axis:   65 Text Interpretation:  Normal sinus rhythm Normal ECG No old tracing to compare Confirmed by [facility]  MD, DAVID ([id number]) on 08/13/2016 12:08:27 AM       Radiology Dg Chest 2 View  Result Date: 08/12/2016 CLINICAL DATA:  Back pain for 3 weeks EXAM: CHEST  2 VIEW COMPARISON:  None. FINDINGS: The heart size and mediastinal contours are within normal limits. Both lungs are clear. The visualized skeletal structures demonstrate mild degenerative changes. IMPRESSION: No active cardiopulmonary disease. Electronically Signed   By: Jasmine Pang M.D.   On: 08/12/2016 21:53    Procedures Procedures (including critical care time)  Medications Ordered in ED Medications  nitroGLYCERIN (NITROSTAT) SL tablet 0.4 mg (not administered)  aspirin chewable  tablet 324 mg (324 mg Oral Given 08/13/16 0459)  sodium chloride 0.9 % bolus 500 mL (500 mLs Intravenous New Bag/Given 08/13/16 0527)  ondansetron (ZOFRAN) injection 4 mg (4 mg Intravenous Given 08/13/16 0515)  morphine 4 MG/ML injection 4 mg (4 mg Intravenous Given 08/13/16 0513)     Initial Impression / Assessment and Plan / ED Course  I have reviewed the triage vital signs and the nursing notes.  Pertinent labs & imaging results that were available during my care of the patient were reviewed by me and considered in my medical decision making (see chart for details).  Clinical Course    Pt with chest pain x 3 weeks, more severe in the last week, central, described as tightness, occurs with exertion, lasts 5-15 minutes, no alleviating factors   Heart score of 4 Concerning hx, does not have meds for diabetes, HTN, HLD, is a current smoker, has family  hx.  Troponin is negative, EKG non-ischemic, BP is elevated here, ordered nitro hoping to treat CP and decrease her BP, but pt was CP free after ASA and morphine so no NTG was given.  She has negative PERC and Wells criteria, do not suspect PE.  Back pain seems to be MSK, and unrelated to exertional CP and SOB episodes, less concern for dissection.  CXR normal, physical exam grossly normal.   Feel pt is high risk with Heart score of 4, out of meds for multiple conditions, has not outpt follow up, Pt admitted to Dr. Konrad Dolores for CP tele obs.     Final Clinical Impressions(s) / ED Diagnoses   Final diagnoses:  Chest pain, unspecified type    New Prescriptions New Prescriptions   No medications on file     Danelle Berry, PA-C 08/13/16 0832    Layla Maw Ward, DO 08/13/16 [phone]

## 2016-08-13 NOTE — ED Notes (Signed)
Ambulated pt to restroom with no issues.

## 2016-08-13 NOTE — ED Notes (Signed)
Patient transported to CT 

## 2016-08-13 NOTE — ED Notes (Signed)
Pt returned from [facility]

## 2016-08-13 NOTE — Progress Notes (Signed)
Verbal order received from Pamala Duffelavid Merrel MD for oxycodone 5mg  every 6 hours PRN, will continue to monitor

## 2016-08-13 NOTE — Progress Notes (Signed)
Patient arrived the unit from the ED on a hospital bed, assessment completed see flowsheet , patient oriented to room and staff, placed on tele , ccmd notified, bed in lowest position , call light within reach will continue to monitor

## 2016-08-13 NOTE — Care Management Note (Addendum)
Case Management Note  Patient Details  Name: Misty Mccullough MRN: [id number] Date of Birth: [birth year]  Subjective/Objective:                  45 y.o. female with medical history significant of HTN, HLD, DM presenting with back pain and chest pain. Of note patient has been without her antihypertensive, and hyperlipidemia medications for more than a year due to not having been back to her physician in [place]. From home.  Action/Plan: Follow for disposition needs. /[facility] [phone]) [facility].   Expected Discharge Date:  08/15/16               Expected Discharge Plan:  Home/Self Care  In-House Referral:  NA  Discharge planning Services  CM Consult, GCCN / P4[facility] (established/new), Indigent [facility], Medication Assistance  Post Acute Care Choice:    Choice offered to:     DME Arranged:    DME Agency:     HH Arranged:    HH Agency:     Status of Service:  In process, will continue to follow  If discussed at Long Length of Stay Meetings, dates discussed:    Additional Comments:  Misty Mccullough, Misty Depaula, RN 08/13/2016, 10:06 AM

## 2016-08-13 NOTE — H&P (Signed)
History and Physical    Misty Mccullough BJY:[id number] DOB: May 23, 1971 DOA: 08/13/2016  PCP: PROVIDER NOT IN SYSTEM Patient coming from: home  Chief Complaint: CP and back pain  HPI: Misty Mccullough is a 45 y.o. female with medical history significant of HTN, HLD, DM presenting with back pain and chest pain. Of note patient has been without her antihypertensive, and hyperlipidemia medications for more than a year due to not having been back to her physician in [place].  Back pain. Started 3 weeks ago. Constant with waxing and waning nature. Worse with certain movements. Associated with numbness. Left worse than right. Denies any trauma to the area. Extends from upper back down through mid back. Radiation around this both sides to include the ribs per patient. Associated with numbness. Warm water soaks with some improvement. Little to no improvement with Aleve and Tylenol with codeine. States that she's had back pain before but has never had anything anywhere near this bad.  Chest pain. Started 4 days ago. Substernal. Worse with exertion. Episodes typically last 5-15 minutes. Relieved with rest. Associated with shortness of breath and hot flashes. Denies radiation to neck jaw or arm, nausea, diaphoresis, palpitations.  Denies fevers, diarrhea, abdominal pain, dysuria, frequency, flank pain, cough, URI symptoms, neck stiffness, headache.  Denies drug use of any kind.   ED Course: Inject the findings outlined below. Nitroglycerin and morphine with significant relief of chest pain.  Review of Systems: As per HPI otherwise 10 point review of systems negative.   Ambulatory Status: no restrictions  Past Medical History:  Diagnosis Date  . Diabetes mellitus without complication (HCC)   . Hypercholesteremia   . Hypertension     Past Surgical History:  Procedure Laterality Date  . TUBAL LIGATION      Social History   Social History  . Marital status: Married    Spouse name: N/A  . Number of  children: N/A  . Years of education: N/A   Occupational History  . Not on file.   Social History Main Topics  . Smoking status: Current Every Day Smoker    Packs/day: 1.00  . Smokeless tobacco: Never Used  . Alcohol use No  . Drug use: No  . Sexual activity: Not on file   Other Topics Concern  . Not on file   Social History Narrative  . No narrative on file    No Known Allergies  Family History  Problem Relation Age of Onset  . Diabetes Mother   . Uterine cancer Mother   . Diabetes Father   . Heart attack Father 52  . Stroke Father   . Heart attack Paternal Grandmother   . Heart attack Paternal Grandfather     Prior to Admission medications   Medication Sig Start Date End Date Taking? Authorizing Provider  metFORMIN (GLUCOPHAGE) 500 MG tablet Take 1 tablet (500 mg total) by mouth 2 (two) times daily with a meal. 06/27/16  Yes April Palumbo, MD  amoxicillin-clavulanate (AUGMENTIN) 875-125 MG tablet Take 1 tablet by mouth every 12 (twelve) hours. Patient not taking: Reported on 08/13/2016 11/13/15   Trixie Dredge, PA-C  benzonatate (TESSALON) 100 MG capsule Take 1 capsule (100 mg total) by mouth 3 (three) times daily as needed for cough. Patient not taking: Reported on 08/13/2016 11/13/15   Trixie Dredge, PA-C  ciprofloxacin (CIPRO) 500 MG tablet Take 1 tablet (500 mg total) by mouth 2 (two) times daily. Patient not taking: Reported on 08/13/2016 06/27/16   April Palumbo, MD  fluticasone (FLONASE) 50 MCG/ACT nasal spray Place 2 sprays into both nostrils daily. Patient not taking: Reported on 08/13/2016 11/13/15   Trixie DredgeEmily West, PA-C    Physical Exam: Vitals:   08/12/16 2107 08/13/16 0430 08/13/16 0519 08/13/16 0758  BP:  189/77 166/95 186/92  Pulse:  82 76 70  Resp:   15 20  Temp:      TempSrc:      SpO2:  100% 97% 99%  Weight: 61.2 kg (135 lb)     Height: 5\' 4"  (1.626 m)        General:  Appears calm and comfortable Eyes:  PERRL, EOMI, normal lids, iris ENT:  grossly normal  hearing, lips & tongue, mmm Neck:  no LAD, masses or thyromegaly Cardiovascular:  RRR, no m/r/g. No LE edema.  Respiratory:  CTA bilaterally, no w/r/r. Normal respiratory effort. Abdomen:  soft, ntnd, NABS Skin:  no rash or induration seen on limited exam Musculoskeletal:  grossly normal tone BUE/BLE, good ROM, no bony abnormality Psychiatric:  grossly normal mood and affect, speech fluent and appropriate, AOx3 Neurologic:  CN 2-12 grossly intact, moves all extremities in coordinated fashion, sensation intact  Labs on Admission: I have personally reviewed following labs and imaging studies  CBC:  Recent Labs Lab 08/12/16 2134  WBC 6.2  HGB 13.8  HCT 40.2  MCV 85.5  PLT 239   Basic Metabolic Panel:  Recent Labs Lab 08/12/16 2134  NA 136  K 3.5  CL 104  CO2 23  GLUCOSE 149*  BUN 7  CREATININE 0.55  CALCIUM 9.1   GFR: Estimated Creatinine Clearance: 75.9 mL/min (by C-G formula based on SCr of 0.55 mg/dL). Liver Function Tests: No results for input(s): AST, ALT, ALKPHOS, BILITOT, PROT, ALBUMIN in the last 168 hours. No results for input(s): LIPASE, AMYLASE in the last 168 hours. No results for input(s): AMMONIA in the last 168 hours. Coagulation Profile: No results for input(s): INR, PROTIME in the last 168 hours. Cardiac Enzymes: No results for input(s): CKTOTAL, CKMB, CKMBINDEX, TROPONINI in the last 168 hours. BNP (last 3 results) No results for input(s): PROBNP in the last 8760 hours. HbA1C: No results for input(s): HGBA1C in the last 72 hours. CBG: No results for input(s): GLUCAP in the last 168 hours. Lipid Profile: No results for input(s): CHOL, HDL, LDLCALC, TRIG, CHOLHDL, LDLDIRECT in the last 72 hours. Thyroid Function Tests: No results for input(s): TSH, T4TOTAL, FREET4, T3FREE, THYROIDAB in the last 72 hours. Anemia Panel: No results for input(s): VITAMINB12, FOLATE, FERRITIN, TIBC, IRON, RETICCTPCT in the last 72 hours. Urine analysis: No results  found for: COLORURINE, APPEARANCEUR, LABSPEC, PHURINE, GLUCOSEU, HGBUR, BILIRUBINUR, KETONESUR, PROTEINUR, UROBILINOGEN, NITRITE, LEUKOCYTESUR  Creatinine Clearance: Estimated Creatinine Clearance: 75.9 mL/min (by C-G formula based on SCr of 0.55 mg/dL).  Sepsis Labs: @LABRCNTIP (procalcitonin:4,lacticidven:4) )No results found for this or any previous visit (from the past 240 hour(s)).   Radiological Exams on Admission: Dg Chest 2 View  Result Date: 08/12/2016 CLINICAL DATA:  Back pain for 3 weeks EXAM: CHEST  2 VIEW COMPARISON:  None. FINDINGS: The heart size and mediastinal contours are within normal limits. Both lungs are clear. The visualized skeletal structures demonstrate mild degenerative changes. IMPRESSION: No active cardiopulmonary disease. Electronically Signed   By: Jasmine PangKim  Fujinaga M.D.   On: 08/12/2016 21:53    EKG: Independently reviewed. Sinus. No ACS.   Assessment/Plan Active Problems:   Chest pain   Back pain   Essential hypertension   HLD (hyperlipidemia)   Diabetes  mellitus with complication (HCC)   CP: HEART score 4-5. Strong family history of heart disease and heart attack with patient history of hypertension, hyperlipidemia and diabetes. Poorly controlled. Current episodes of chest pain or exertional. EKG reassuring. Troponin normal 2.- - Tele - NM stress test. Cards consult if + - cycle trop - resume Rx for DM, HTN, HLD for risk factor modification  Back pain: MSK vs cord compression.  - CT T-spine - robaxin, heat pad, Toradol, Tylenol - UA pending - UTI???  HTN: out of meds for >5175yr. Unsure of which ones she was on before - Resume Lisinopril - hydralazine   HLD: - Lipid panel - start statin  DM:on metformin only - A1c - SSI   DVT prophylaxis: Lovenox  Code Status: full  Family Communication: none  Disposition Plan: pending NM stress test.  Consults called: none - cards if NM +  Admission status: obs    Blythe Hartshorn J MD [facility]  If 7PM-7AM, please contact night-coverage [email or web address] Password [email or web address]  08/13/2016, 8:41 AM

## 2016-08-13 NOTE — ED Notes (Signed)
Pt brought by security back inside after found smoking outside of ED

## 2016-08-13 NOTE — ED Notes (Signed)
Pt transported to NM study

## 2016-08-13 NOTE — ED Notes (Signed)
Pt transported to nuclear med.  

## 2016-08-13 NOTE — Progress Notes (Signed)
Nuc result reviewed with Dr. Mayford Knifeurner, felt to be normal, no ischemia identified, normal EF. Reviewed results with Dr. Konrad DoloresMerrell who will inform patient of result and determine further plan for this hospitalization. Dayna Dunn PA-C

## 2016-08-13 NOTE — ED Notes (Signed)
[facility] 2[facility]

## 2016-08-14 DIAGNOSIS — E440 Moderate protein-calorie malnutrition: Secondary | ICD-10-CM | POA: Insufficient documentation

## 2016-08-14 DIAGNOSIS — R0789 Other chest pain: Secondary | ICD-10-CM

## 2016-08-14 LAB — CBC
HCT: 36.8 % (ref 36.0–46.0)
HEMOGLOBIN: 12.2 g/dL (ref 12.0–15.0)
MCH: 28.7 pg (ref 26.0–34.0)
MCHC: 33.2 g/dL (ref 30.0–36.0)
MCV: 86.6 fL (ref 78.0–100.0)
PLATELETS: 190 10*3/uL (ref 150–400)
RBC: 4.25 MIL/uL (ref 3.87–5.11)
RDW: 14.7 % (ref 11.5–15.5)
WBC: 3.9 10*3/uL — AB (ref 4.0–10.5)

## 2016-08-14 LAB — BASIC METABOLIC PANEL
ANION GAP: 8 (ref 5–15)
BUN: 6 mg/dL (ref 6–20)
CHLORIDE: 107 mmol/L (ref 101–111)
CO2: 23 mmol/L (ref 22–32)
Calcium: 8.8 mg/dL — ABNORMAL LOW (ref 8.9–10.3)
Creatinine, Ser: 0.51 mg/dL (ref 0.44–1.00)
Glucose, Bld: 110 mg/dL — ABNORMAL HIGH (ref 65–99)
POTASSIUM: 3.8 mmol/L (ref 3.5–5.1)
SODIUM: 138 mmol/L (ref 135–145)

## 2016-08-14 LAB — GLUCOSE, CAPILLARY
GLUCOSE-CAPILLARY: 138 mg/dL — AB (ref 65–99)
GLUCOSE-CAPILLARY: 130 mg/dL — AB (ref 65–99)

## 2016-08-14 MED ORDER — ENSURE ENLIVE PO LIQD: 237.0000 mL | Freq: Two times a day (BID) | ORAL | Status: DC

## 2016-08-14 MED ORDER — ACETAMINOPHEN 325 MG PO TABS: 650.0000 mg | ORAL_TABLET | Freq: Four times a day (QID) | ORAL | 0 refills | Status: DC | PRN

## 2016-08-14 MED ORDER — FLUTICASONE PROPIONATE 50 MCG/ACT NA SUSP: 2.0000 | Freq: Every day | NASAL | 0 refills | Status: DC

## 2016-08-14 MED ORDER — ENSURE ENLIVE PO LIQD: 237.0000 mL | Freq: Two times a day (BID) | ORAL | 12 refills | Status: DC

## 2016-08-14 MED ORDER — LISINOPRIL 10 MG PO TABS: 10.0000 mg | ORAL_TABLET | Freq: Every day | ORAL | 0 refills | Status: DC

## 2016-08-14 MED ORDER — TRAMADOL HCL 50 MG PO TABS
50.0000 mg | ORAL_TABLET | Freq: Four times a day (QID) | ORAL | 0 refills | Status: DC | PRN
Start: 2016-08-14 — End: 2016-09-06

## 2016-08-14 MED ORDER — TRAMADOL HCL 50 MG PO TABS: 50.0000 mg | ORAL_TABLET | Freq: Four times a day (QID) | ORAL | Status: DC | PRN

## 2016-08-14 MED ORDER — METFORMIN HCL 500 MG PO TABS: 500.0000 mg | ORAL_TABLET | Freq: Two times a day (BID) | ORAL | 0 refills | Status: DC

## 2016-08-14 MED ORDER — PANTOPRAZOLE SODIUM 40 MG PO TBEC: 40.0000 mg | DELAYED_RELEASE_TABLET | Freq: Every day | ORAL | 0 refills | Status: DC

## 2016-08-14 MED ORDER — PRAVASTATIN SODIUM 20 MG PO TABS: 20.0000 mg | ORAL_TABLET | Freq: Every day | ORAL | 0 refills | Status: DC

## 2016-08-14 NOTE — Progress Notes (Signed)
Initial Nutrition Assessment  DOCUMENTATION CODES:   Non-severe (moderate) malnutrition in context of chronic illness  INTERVENTION:   - Provide Ensure Enlive oral nutrition supplement BID. Each provides 350 kcal and 20 grams protein. - Encourage PO intake.  NUTRITION DIAGNOSIS:   Malnutrition related to chronic illness as evidenced by mild depletion of body fat, moderate depletions of muscle mass.  GOAL:   Patient will meet greater than or equal to 90% of their needs  MONITOR:   PO intake, Supplement acceptance, Labs, Weight trends  REASON FOR ASSESSMENT:   Malnutrition Screening Tool   ASSESSMENT:   45 y.o. female with medical history significant of HTN, HLD, DM presenting with back pain and chest pain. Of note patient has been without her antihypertensive, and hyperlipidemia medications for more than a year due to not having been back to her physician in [place].  Spoke with pt and daughter at bedside. Pt reports poor appetite and getting full easily. Pt also reports lack of teeth makes it difficult to eat and that she gets tired of chewing. Pt reports eating 2-3 meals per day with snacks. Discussed adding oral nutrition supplement to current diet.  Pt reports weighing 220# approximately 1 year ago. Pt reports she stopped consuming regular soda and lost weight rapidly. Some of the weight loss was intentional, but most of it was not.  Medications reviewed and include sliding scale Novolog, 40 mg Protonix daily, 20 mg Pravachol daily, PRN Zofran  Labs reviewed and include low calcium (8.8 mg/dL) CBG's: [id number] mg/dL  NFPE: Exam completed. Mild fat depletion, moderate muscle depletion, and no edema noted.  Diet Order:  Diet Heart Room service appropriate? Yes; Fluid consistency: Thin Diet - low sodium heart healthy  Skin:  Reviewed, no issues  Last BM:  08/12/16  Height:   Ht Readings from Last 1 Encounters:  08/13/16 5\' 4"  (1.626 m)    Weight:   Wt Readings  from Last 1 Encounters:  08/13/16 140 lb 10.5 oz (63.8 kg)    Ideal Body Weight:  54.5 kg  BMI:  Body mass index is 24.14 kg/m.  Estimated Nutritional Needs:   Kcal:  1600-1800  Protein:  75-90 grams  Fluid:  1.6-1.8 L/day  EDUCATION NEEDS:   No education needs identified at this time  Rosemarie AxKate Buffy Ehler Dietetic Intern Pager Number: [phone]

## 2016-08-14 NOTE — Discharge Summary (Signed)
Physician Discharge Summary  Misty Mccullough JYN:[id number] DOB: 10-Jan-1971 DOA: 08/13/2016  PCP: PROVIDER NOT IN SYSTEM  Admit date: 08/13/2016 Discharge date: 08/14/2016  Admitted From: home  Disposition:  home  Recommendations for Outpatient Follow-up:  1. Follow up with PCP in 1-2 weeks 2. Please obtain BMP/CBC in one week 3. Needs further evaluation for weight loss.    Discharge Condition: stable.  CODE STATUS: Full code.  Diet recommendation: Heart Healthy    Brief/Interim Summary: Misty DenseDebra Mccullough is a 45 y.o. female with medical history significant of HTN, HLD, DM presenting with back pain and chest pain. Of note patient has been without her antihypertensive, and hyperlipidemia medications for more than a year due to not having been back to her physician in [place].  Back pain. Started 3 weeks ago. Constant with waxing and waning nature. Worse with certain movements. Associated with numbness. Left worse than right. Denies any trauma to the area. Extends from upper back down through mid back. Radiation around this both sides to include the ribs per patient. Associated with numbness. Warm water soaks with some improvement. Little to no improvement with Aleve and Tylenol with codeine. States that she's had back pain before but has never had anything anywhere near this bad.  Chest pain. Started 4 days ago. Substernal. Worse with exertion. Episodes typically last 5-15 minutes. Relieved with rest. Associated with shortness of breath and hot flashes. Denies radiation to neck jaw or arm, nausea, diaphoresis, palpitations.  Denies fevers, diarrhea, abdominal pain, dysuria, frequency, flank pain, cough, URI symptoms, neck stiffness, headache.  Denies drug use of any kind.   ED Course: Inject the findings outlined below. Nitroglycerin and morphine with significant relief of chest pain.  1-chest pain, back pain;  Stress test low risk, no reversible ischemia. Ct back no nerve damage, mild  arthritis change.  Pain management, tramadol.  Troponin negative.   2-HTN resume home medications. Will provide refill.  3-DM; will also provide refill for metformin.  4-un-intentional weight loss. Advised patient to follow up with PCP for further evaluation.  5-bronchiolitis; advised to stop smoking.    Discharge Diagnoses:  Active Problems:   Chest pain   Back pain   Essential hypertension   HLD (hyperlipidemia)   Diabetes mellitus with complication [facility](HCC)    Discharge Instructions  Discharge Instructions    Diet - low sodium heart healthy    Complete by:  As directed    Increase activity slowly    Complete by:  As directed        Medication List    STOP taking these medications   benzonatate 100 MG capsule Commonly known as:  TESSALON     TAKE these medications   acetaminophen 325 MG tablet Commonly known as:  TYLENOL Take 2 tablets (650 mg total) by mouth every 6 (six) hours as needed for mild pain (or Fever >/= 101).   fluticasone 50 MCG/ACT nasal spray Commonly known as:  FLONASE Place 2 sprays into both nostrils daily.   lisinopril 10 MG tablet Commonly known as:  PRINIVIL,ZESTRIL Take 1 tablet (10 mg total) by mouth daily.   metFORMIN 500 MG tablet Commonly known as:  GLUCOPHAGE Take 1 tablet (500 mg total) by mouth 2 (two) times daily with a meal.   pantoprazole 40 MG tablet Commonly known as:  PROTONIX Take 1 tablet (40 mg total) by mouth daily.   pravastatin 20 MG tablet Commonly known as:  PRAVACHOL Take 1 tablet (20 mg total) by mouth daily at  6 PM.   traMADol 50 MG tablet Commonly known as:  ULTRAM Take 1 tablet (50 mg total) by mouth every 6 (six) hours as needed for severe pain.      Follow-up Information    PROVIDER NOT IN SYSTEM Follow up.   Why:  please follow up with PCP in 1 week.          No Known Allergies  Consultations:  Cardiology    Procedures/Studies: Dg Chest 2 View  Result Date: 08/12/2016 CLINICAL  DATA:  Back pain for 3 weeks EXAM: CHEST  2 VIEW COMPARISON:  None. FINDINGS: The heart size and mediastinal contours are within normal limits. Both lungs are clear. The visualized skeletal structures demonstrate mild degenerative changes. IMPRESSION: No active cardiopulmonary disease. Electronically Signed   By: Jasmine Pang M.D.   On: 08/12/2016 21:53   Ct Thoracic Spine Wo Contrast  Result Date: 08/13/2016 CLINICAL DATA:  Back pain for 3 weeks that radiates around both sides. EXAM: CT THORACIC SPINE WITHOUT CONTRAST TECHNIQUE: Multidetector CT imaging of the thoracic spine was performed without intravenous contrast administration. Multiplanar CT image reconstructions were also generated. COMPARISON:  None. FINDINGS: Alignment: Mildly exaggerated thoracic kyphosis. Vertebrae: Bones appear osteopenic for age. Multiple chronic Schmorl's nodes. No fracture deformity, endplate erosion, or evidence of focal bone lesion. Paraspinal and other soft tissues: The lungs have generalized subtle hazy nodular appearance with centrilobular pattern. Smoking history and airway thickening favors respiratory bronchiolitis. Hypersensitivity pneumonitis could have this appearance but no indication of acute symptoms. Disc levels: Small calcified disc protrusions and spurs at T5-6, T6-7, T7-8, and left paracentral at T8-9. These do not cause significant canal stenosis. No significant facet arthropathy. No bony foraminal stenosis. IMPRESSION: 1. No acute finding to explain recent back pain. 2. Chronic/calcified small disc protrusions without significant stenosis. No evidence of foraminal impingement. 3. Osteopenic appearance. 4. Centrilobular ground-glass nodules, suspect respiratory bronchiolitis in this smoker. Electronically Signed   By: Marnee Spring M.D.   On: 08/13/2016 09:41   Nm Myocar Multi W/spect W/wall Motion / Ef  Result Date: 08/13/2016  Horizontal ST segment depression ST segment depression of 0.5 mm was noted  during stress in the II, III, aVF, V5 and V6 leads.  No T wave inversion was noted during stress.  There is a small defect of mild severity present in the apical anterior and apical septal location. The defect is non-reversible and consistent with breast attenuation artifact.  This is a low risk study.  The left ventricular ejection fraction is normal (55-65%).  Nuclear stress EF: 60%.        Subjective: Feeling better with pain meds.   Discharge Exam: Vitals:   08/13/16 2100 08/14/16 0507  BP: (!) 155/76 (!) 149/78  Pulse: 83 77  Resp:  18  Temp: 97.7 F (36.5 C) 98 F (36.7 C)   Vitals:   08/13/16 1410 08/13/16 1900 08/13/16 2100 08/14/16 0507  BP: 158/71 (!) 187/84 (!) 155/76 (!) 149/78  Pulse: 76 73 83 77  Resp: 18 18  18   Temp: 97.7 F (36.5 C) 97.8 F (36.6 C) 97.7 F (36.5 C) 98 F (36.7 C)  TempSrc: Oral Oral Oral Oral  SpO2: 97% 98%  97%  Weight: 63.8 kg (140 lb 10.5 oz)     Height: 5\' 4"  (1.626 m)       General: Pt is alert, awake, not in acute distress Cardiovascular: RRR, S1/S2 +, no rubs, no gallops Respiratory: CTA bilaterally, no wheezing, no rhonchi  Abdominal: Soft, NT, ND, bowel sounds + Extremities: no edema, no cyanosis    The results of significant diagnostics from this hospitalization (including imaging, microbiology, ancillary and laboratory) are listed below for reference.     Microbiology: No results found for this or any previous visit (from the past 240 hour(s)).   Labs: BNP (last 3 results) No results for input(s): BNP in the last 8760 hours. Basic Metabolic Panel:  Recent Labs Lab 08/12/16 2134 08/14/16 0410  NA 136 138  K 3.5 3.8  CL 104 107  CO2 23 23  GLUCOSE 149* 110*  BUN 7 6  CREATININE 0.55 0.51  CALCIUM 9.1 8.8*   Liver Function Tests: No results for input(s): AST, ALT, ALKPHOS, BILITOT, PROT, ALBUMIN in the last 168 hours. No results for input(s): LIPASE, AMYLASE in the last 168 hours. No results for  input(s): AMMONIA in the last 168 hours. CBC:  Recent Labs Lab 08/12/16 2134 08/14/16 0410  WBC 6.2 3.9*  HGB 13.8 12.2  HCT 40.2 36.8  MCV 85.5 86.6  PLT 239 190   Cardiac Enzymes:  Recent Labs Lab 08/13/16 0839 08/13/16 1504  TROPONINI <0.03 <0.03   BNP: Invalid input(s): POCBNP CBG:  Recent Labs Lab 08/13/16 0856 08/13/16 1427 08/13/16 1629 08/13/16 2029 08/14/16 0607  GLUCAP 110* 295* 157* 113* 138*   D-Dimer No results for input(s): DDIMER in the last 72 hours. Hgb A1c  Recent Labs  08/13/16 0835  HGBA1C 7.3*   Lipid Profile  Recent Labs  08/13/16 0833  CHOL 192  HDL 41  LDLCALC 138*  TRIG 64  CHOLHDL 4.7   Thyroid function studies No results for input(s): TSH, T4TOTAL, T3FREE, THYROIDAB in the last 72 hours.  Invalid input(s): FREET3 Anemia work up No results for input(s): VITAMINB12, FOLATE, FERRITIN, TIBC, IRON, RETICCTPCT in the last 72 hours. Urinalysis No results found for: COLORURINE, APPEARANCEUR, LABSPEC, PHURINE, GLUCOSEU, HGBUR, BILIRUBINUR, KETONESUR, PROTEINUR, UROBILINOGEN, NITRITE, LEUKOCYTESUR Sepsis Labs Invalid input(s): PROCALCITONIN,  WBC,  LACTICIDVEN Microbiology No results found for this or any previous visit (from the past 240 hour(s)).   Time coordinating discharge: Over 30 minutes  SIGNED:   Alba Coryegalado, Harshan Kearley A, MD  [facility] 08/14/2016, 9:22 AM Pager   If 7PM-7AM, please contact night-coverage [email or web address] Password [email or web address]

## 2016-08-14 NOTE — Care Management Note (Signed)
Case Management Note Donn PieriniKristi Fayth Trefry RN, BSN Unit 2W-Case Manager [phone]  Patient Details  Name: Tyson DenseDebra Tobler MRN: [id number] Date of Birth: Aug 16, 1971  Subjective/Objective:    Pt admitted with c/p- hx of HTN                Action/Plan: PTA pt lived at home- independent- pt had a PCP in [place]- but no PCP established here- [facility] at 9 am- info given to pt on clinic- along with other clinics in area. Pt plans to use [facility] for medications states that she can get meds without difficulty.    Expected Discharge Date:  08/14/16             Expected Discharge Plan:  Home/Self Care  In-House Referral:  NA  Discharge planning Services  CM Consult, [facility] ([facility]), [facility], Medication Assistance, Follow-up appt scheduled  Post Acute Care Choice:    Choice offered to:     DME Arranged:    DME Agency:     HH Arranged:    HH Agency:     Status of Service:  Completed, signed off  If discussed at [employer] Length of Stay Meetings, dates discussed:    Additional Comments:  Darrold SpanWebster, Jazae Gandolfi Hall, RN 08/14/2016, 11:03 AM

## 2016-08-20 ENCOUNTER — Ambulatory Visit: Payer: Self-pay | Admitting: Family Medicine

## 2016-09-06 ENCOUNTER — Ambulatory Visit (INDEPENDENT_AMBULATORY_CARE_PROVIDER_SITE_OTHER): Payer: Self-pay | Admitting: Family Medicine

## 2016-09-06 ENCOUNTER — Encounter: Payer: Self-pay | Admitting: Family Medicine

## 2016-09-06 VITALS — BP 153/73 | HR 90 | Temp 97.9°F | Resp 14 | Ht 64.0 in | Wt 137.0 lb

## 2016-09-06 DIAGNOSIS — E118 Type 2 diabetes mellitus with unspecified complications: Secondary | ICD-10-CM

## 2016-09-06 DIAGNOSIS — R634 Abnormal weight loss: Secondary | ICD-10-CM

## 2016-09-06 DIAGNOSIS — Z1231 Encounter for screening mammogram for malignant neoplasm of breast: Secondary | ICD-10-CM

## 2016-09-06 DIAGNOSIS — Z1239 Encounter for other screening for malignant neoplasm of breast: Secondary | ICD-10-CM

## 2016-09-06 DIAGNOSIS — Z23 Encounter for immunization: Secondary | ICD-10-CM

## 2016-09-06 LAB — TSH: TSH: 1.26 m[IU]/L

## 2016-09-06 MED ORDER — TRAMADOL HCL 50 MG PO TABS: 50.0000 mg | ORAL_TABLET | Freq: Four times a day (QID) | ORAL | 0 refills | Status: DC | PRN

## 2016-09-06 MED ORDER — FLUTICASONE PROPIONATE 50 MCG/ACT NA SUSP: 2.0000 | Freq: Every day | NASAL | 0 refills | Status: DC

## 2016-09-06 MED ORDER — LISINOPRIL 10 MG PO TABS: 10.0000 mg | ORAL_TABLET | Freq: Every day | ORAL | 0 refills | Status: DC

## 2016-09-06 MED ORDER — PRAVASTATIN SODIUM 20 MG PO TABS: 20.0000 mg | ORAL_TABLET | Freq: Every day | ORAL | 0 refills | Status: DC

## 2016-09-06 MED ORDER — METFORMIN HCL 500 MG PO TABS: 500.0000 mg | ORAL_TABLET | Freq: Two times a day (BID) | ORAL | 0 refills | Status: DC

## 2016-09-06 NOTE — Progress Notes (Signed)
Misty DenseDebra Yambao, is a 45 y.o. female  ZOX:[id number]  [id number]:[id number]  DOB - 11/03/1970  CC:  Chief Complaint  Patient presents with  . Establish Care  . Hypertension  . Diabetes  . Pain    pain in ribs        HPI: Misty Mccullough is a 45 y.o. female here to establish care. She was seen recently in ED for back and rib cage pain, without a diagnosis. She has a history of diabetes and hypertension and had been off medications for several months. She was restarted in ED about 3 weeks. Ago. At that time her A1C was 7.3. She is on metformin 500 bid. Her BP this morning is 153/73, having taken her lisinopril 10 mg. She was started pm protonix in ED in case pain was related to reflux. She reports that has not helped. There were abnormalities of the throracic spine, but apparently not considered the reason for the back pain. She also was experiencing chest pain. A cardiac origin was ruled out. Her other major concern is weight loss over the last year. Her weight has dropped from 220 to 137. The only reason she can suggest for this is the loss of her teeth. Since her ED visit she has been drinking ensure. She smokes one pack of cigarettes daily and is not ready to quit. She denies alcohol or drug use.  Health maintenance: she is in need of mammogram. She is to receive a flu shot today, declines other immunization.    No Known Allergies Past Medical History:  Diagnosis Date  . Diabetes mellitus without complication (HCC)   . Hypercholesteremia   . Hypertension    Current Outpatient Prescriptions on File Prior to Visit  Medication Sig Dispense Refill  . acetaminophen (TYLENOL) 325 MG tablet Take 2 tablets (650 mg total) by mouth every 6 (six) hours as needed for mild pain (or Fever >/= 101). 20 tablet 0  . feeding supplement, ENSURE ENLIVE, (ENSURE ENLIVE) LIQD Take 237 mLs by mouth 2 (two) times daily between meals. 237 mL 12  . pantoprazole (PROTONIX) 40 MG tablet Take 1 tablet (40 mg total) by  mouth daily. 30 tablet 0   No current facility-administered medications on file prior to visit.    Family History  Problem Relation Age of Onset  . Diabetes Mother   . Uterine cancer Mother   . Diabetes Father   . Heart attack Father [id number]  . Stroke Father   . Heart attack Paternal Grandmother   . Heart attack Paternal Grandfather    Social History   Social History  . Marital status: Married    Spouse name: N/A  . Number of children: N/A  . Years of education: N/A   Occupational History  . Not on file.   Social History Main Topics  . Smoking status: Current Every Day Smoker    Packs/day: 1.00  . Smokeless tobacco: Never Used  . Alcohol use No  . Drug use: No  . Sexual activity: Not on file   Other Topics Concern  . Not on file   Social History Narrative  . No narrative on file    Review of Systems: Constitutional: + fatigue, weight loss, eating less due to loss of teeth Skin: Negative HENT: Negative  Eyes: Negative  Neck: Negative Respiratory: Negative Cardiovascular: + for chest pain a couple of weeks ago, determined to be non-cardiac Gastrointestinal: Negative Genitourinary: Negative  Musculoskeletal: + Pain in thoracic back and lateral rib cage  Neurological: Negative for Hematological: + for easy bruising. Psychiatric/Behavioral: Negative    Objective:   Vitals:   09/06/16 0927  BP: (!) 153/73  Pulse: 90  Resp: 14  Temp: 97.9 F (36.6 C)    Physical Exam: Constitutional:  No distress. Thin, sagging skin.  HENT: Normocephalic, atraumatic, External right and left ear normal. Oropharynx is clear and moist.  Eyes: Conjunctivae and EOM are normal. PERRLA, no scleral icterus. Neck: Normal ROM. Neck supple. No lymphadenopathy, No thyromegaly. CVS: RRR, S1/S2 +, no murmurs, no gallops, no rubs Pulmonary: Effort and breath sounds normal, no stridor, rhonchi, wheezes, rales.  Abdominal: Soft. Normoactive BS,, no distension, tenderness, rebound or  guarding.  Musculoskeletal: Normal range of motion. No edema and no tenderness.  Neuro: Alert.Normal muscle tone coordination. Non-focal Skin: Skin is warm and dry. No rash noted. Not diaphoretic. No erythema. No pallor. Psychiatric: Normal mood and affect. Behavior, judgment, thought content normal.  Lab Results  Component Value Date   WBC 3.9 (L) 08/14/2016   HGB 12.2 08/14/2016   HCT 36.8 08/14/2016   MCV 86.6 08/14/2016   PLT 190 08/14/2016   Lab Results  Component Value Date   CREATININE 0.51 08/14/2016   BUN 6 08/14/2016   NA 138 08/14/2016   K 3.8 08/14/2016   CL 107 08/14/2016   CO2 23 08/14/2016    Lab Results  Component Value Date   HGBA1C 7.3 (H) 08/13/2016   Lipid Panel     Component Value Date/Time   CHOL 192 08/13/2016 0833   TRIG 64 08/13/2016 0833   HDL 41 08/13/2016 0833   CHOLHDL 4.7 08/13/2016 0833   VLDL 13 08/13/2016 0833   LDLCALC 138 (H) 08/13/2016 0833        Assessment and plan:   1. Need for prophylactic vaccination and inoculation against influenza  - Flu Vaccine QUAD 36+ mos PF IM (Fluarix & Fluzone Quad PF)  2. Type 2 diabetes mellitus with complication, unspecified long term insulin use status (HCC)  - Microalbumin, urine -continue metformin 500 bid  3. Loss of weight  - TSH - POC Hemoccult Bld/Stl (3-Cd Home Screen); Future -To return for PAP and BP check  4. Screening breast examination  - MM DIGITAL SCREENING BILATERAL; Future   Return in about 3 months (around 12/05/2016), or soon for PAP, for HTN, Diabetes, A1C.  The patient was given clear instructions to go to ER or return to medical center if symptoms don't improve, worsen or new problems develop. The patient verbalized understanding.    Henrietta HooverLinda C Lissandro Dilorenzo FNP  09/06/2016, 11:26 AM

## 2016-09-07 LAB — MICROALBUMIN, URINE: MICROALB UR: 1.2 mg/dL

## 2016-09-09 ENCOUNTER — Telehealth: Payer: Self-pay

## 2016-09-09 ENCOUNTER — Other Ambulatory Visit: Payer: Self-pay | Admitting: Family Medicine

## 2016-09-09 MED ORDER — OMEPRAZOLE 40 MG PO CPDR: 40.0000 mg | DELAYED_RELEASE_CAPSULE | Freq: Every day | ORAL | 3 refills | Status: DC

## 2016-09-09 NOTE — Telephone Encounter (Signed)
Spoke with patient she is requesting 2 things, she would like something sent in for Acid Reflux, protonix does not work, and she needs something other than Ultram for her back pain. I advised her we do not sent in narcotics. Please advise thank you

## 2016-09-13 ENCOUNTER — Ambulatory Visit: Payer: Self-pay | Admitting: Family Medicine

## 2016-09-16 ENCOUNTER — Ambulatory Visit: Payer: Self-pay | Admitting: Family Medicine

## 2016-09-18 ENCOUNTER — Telehealth: Payer: Self-pay

## 2016-09-18 NOTE — Telephone Encounter (Signed)
Patient asking for refill on Tramadol. LOV 09/06/2016. Please advise. Thanks!

## 2016-09-19 ENCOUNTER — Encounter (HOSPITAL_COMMUNITY): Payer: Self-pay | Admitting: Emergency Medicine

## 2016-09-19 ENCOUNTER — Emergency Department (HOSPITAL_COMMUNITY)
Admission: EM | Admit: 2016-09-19 | Discharge: 2016-09-20 | Disposition: A | Discharge status: Home / Self Care | Payer: Self-pay | Attending: Emergency Medicine | Admitting: Emergency Medicine

## 2016-09-19 DIAGNOSIS — Z7984 Long term (current) use of oral hypoglycemic drugs: Secondary | ICD-10-CM | POA: Insufficient documentation

## 2016-09-19 DIAGNOSIS — M545 Low back pain, unspecified: Secondary | ICD-10-CM

## 2016-09-19 DIAGNOSIS — K5900 Constipation, unspecified: Secondary | ICD-10-CM | POA: Insufficient documentation

## 2016-09-19 DIAGNOSIS — F17200 Nicotine dependence, unspecified, uncomplicated: Secondary | ICD-10-CM | POA: Insufficient documentation

## 2016-09-19 DIAGNOSIS — I10 Essential (primary) hypertension: Secondary | ICD-10-CM | POA: Insufficient documentation

## 2016-09-19 DIAGNOSIS — E119 Type 2 diabetes mellitus without complications: Secondary | ICD-10-CM | POA: Insufficient documentation

## 2016-09-19 NOTE — ED Provider Notes (Signed)
[facility]-EMERGENCY DEPT Provider Note   CSN: [id number] Arrival date & time: 09/19/16  2304 By signing my name below, I, Misty Mccullough, attest that this documentation has been prepared under the direction and in the presence of Misty FavorGail Lenzi Marmo, FNP. Electronically Signed: Bridgette HabermannMaria Mccullough, ED Scribe. 09/20/16. 12:21 AM.  History   Chief Complaint Chief Complaint  Patient presents with  . Back Pain   HPI Comments: Misty Mccullough is a 45 y.o. female with h/o HTN and DM who presents to the Emergency Department complaining of acute on chronic, 10/10 back pain onset approximately one month ago with associated constipation. She denies any recent injury or trauma. Pain is exacerbated with movement. She has tried Tylenol and two OTC laxatives with no relief. Her last bowel movement was one week ago. She notes that [facility] for the same symptoms and was admitted. Through her admission, she had several tests done but states that they did not find out what was causing her pain. Pt was prescribed Tramadol that she states does not help her. Pt's LNMP was last week. She denies difficulty urinating, fever, chills, or any other associated symptoms.  The history is provided by the patient. No language interpreter was used.    Past Medical History:  Diagnosis Date  . Diabetes mellitus without complication (HCC)   . Hypercholesteremia   . Hypertension     Patient Active Problem List   Diagnosis Date Noted  . Malnutrition of moderate degree 08/14/2016  . Chest pain 08/13/2016  . Back pain 08/13/2016  . Essential hypertension 08/13/2016  . HLD (hyperlipidemia) 08/13/2016  . Diabetes mellitus with complication (HCC) 08/13/2016  . Chest pain, exertional     Past Surgical History:  Procedure Laterality Date  . TUBAL LIGATION      OB History    No data available       Home Medications    Prior to Admission medications   Medication Sig Start Date End Date Taking? Authorizing Provider    acetaminophen (TYLENOL) 325 MG tablet Take 2 tablets (650 mg total) by mouth every 6 (six) hours as needed for mild pain (or Fever >/= 101). 08/14/16  Yes Belkys A Regalado, MD  feeding supplement, ENSURE ENLIVE, (ENSURE ENLIVE) LIQD Take 237 mLs by mouth 2 (two) times daily between meals. 08/14/16  Yes Belkys A Regalado, MD  fluticasone (FLONASE) 50 MCG/ACT nasal spray Place 2 sprays into both nostrils daily. 09/06/16  Yes Henrietta HooverLinda C Bernhardt, NP  lisinopril (PRINIVIL,ZESTRIL) 10 MG tablet Take 1 tablet (10 mg total) by mouth daily. 09/06/16  Yes Henrietta HooverLinda C Bernhardt, NP  metFORMIN (GLUCOPHAGE) 500 MG tablet Take 1 tablet (500 mg total) by mouth 2 (two) times daily with a meal. 09/06/16  Yes Henrietta HooverLinda C Bernhardt, NP  Multiple Vitamin (MULTIVITAMIN WITH MINERALS) TABS tablet Take 1 tablet by mouth daily.   Yes Historical Provider, MD  omeprazole (PRILOSEC) 40 MG capsule Take 1 capsule (40 mg total) by mouth daily. 09/09/16  Yes Henrietta HooverLinda C Bernhardt, NP  pravastatin (PRAVACHOL) 20 MG tablet Take 1 tablet (20 mg total) by mouth daily at 6 PM. 09/06/16  Yes Henrietta HooverLinda C Bernhardt, NP  polyethylene glycol powder (GLYCOLAX/MIRALAX) powder Take 17 g by mouth daily. 09/20/16   Misty FavorGail Mackay Hanauer, NP  traMADol (ULTRAM) 50 MG tablet Take 1 tablet (50 mg total) by mouth every 6 (six) hours as needed for severe pain. 09/20/16   Misty FavorGail Abdulah Iqbal, NP    Family History Family History  Problem Relation Age of Onset  . Diabetes Mother   . Uterine cancer Mother   . Diabetes Father   . Heart attack Father 68  . Stroke Father   . Heart attack Paternal Grandmother   . Heart attack Paternal Grandfather     Social History Social History  Substance Use Topics  . Smoking status: Current Every Day Smoker    Packs/day: 1.00  . Smokeless tobacco: Never Used  . Alcohol use No     Allergies   Patient has no known allergies.   Review of Systems Review of Systems  Constitutional: Negative for chills and fever.  HENT: Negative.   Eyes:  Negative.   Respiratory: Negative for cough and shortness of breath.   Cardiovascular: Negative for chest pain and leg swelling.  Gastrointestinal: Positive for abdominal pain and constipation. Negative for diarrhea, nausea and vomiting.  Genitourinary: Negative for difficulty urinating, dysuria and frequency.  Musculoskeletal: Positive for back pain.  Neurological: Negative for dizziness, light-headedness and headaches.  All other systems reviewed and are negative.    Physical Exam Updated Vital Signs BP (!) 203/87 (BP Location: Right Arm)   Pulse 83   Temp 98.1 F (36.7 C) (Oral)   Resp 18   Ht 5\' 4"  (1.626 m)   Wt 61.2 kg   LMP 09/02/2016 Comment:  neg preg test  SpO2 99%   BMI 23.17 kg/m   Physical Exam  Constitutional: She appears well-developed and well-nourished.  HENT:  Head: Normocephalic.  Eyes: Conjunctivae are normal.  Neck: Normal range of motion.  Cardiovascular: Normal rate.   Pulmonary/Chest: Effort normal. No respiratory distress.  Abdominal: Soft. Bowel sounds are normal. She exhibits no distension. There is generalized tenderness.  Musculoskeletal: Normal range of motion. She exhibits no edema or tenderness.  Neurological: She is alert.  Skin: Skin is warm and dry.  Psychiatric: She has a normal mood and affect. Her behavior is normal.  Nursing note and vitals reviewed.    ED Treatments / Results  DIAGNOSTIC STUDIES: Oxygen Saturation is 100% on RA, normal by my interpretation.    COORDINATION OF CARE: 12:21 AM Discussed treatment plan with pt at bedside and pt agreed to plan.  Labs (all labs ordered are listed, but only abnormal results are displayed) Labs Reviewed  URINALYSIS, ROUTINE W REFLEX MICROSCOPIC - Abnormal; Notable for the following:       Result Value   Color, Urine STRAW (*)    Specific Gravity, Urine 1.003 (*)    All other components within normal limits  PREGNANCY, URINE  POC URINE PREG, ED    EKG  EKG  Interpretation None       Radiology Dg Abd Acute W/chest  Result Date: 09/20/2016 CLINICAL DATA:  Abdominal pain for 1 month, increasing. No nausea. History of hypertension diabetes. EXAM: DG ABDOMEN ACUTE W/ 1V CHEST COMPARISON:  Chest radiograph August 12, 2016 FINDINGS: Cardiomediastinal silhouette is normal. Lungs are clear, no pleural effusions. No pneumothorax. Soft tissue planes and included osseous structures are unremarkable. Bowel gas pattern is nondilated and nonobstructive. Moderate amount of retained large bowel stool. No intra-abdominal mass effect, pathologic calcifications or free air. Phleboliths project in the pelvis. Soft tissue planes and included osseous structures are non-suspicious. IMPRESSION: Normal chest. Moderate amount of retained large bowel stool, normal bowel gas pattern. Electronically Signed   By: Awilda Metro M.D.   On: 09/20/2016 02:19    Procedures Procedures (including critical care time)  Medications Ordered in ED Medications  fentaNYL (SUBLIMAZE) injection 50 mcg (not administered)  ketorolac (TORADOL) 30 MG/ML injection 30 mg (30 mg Intramuscular Given 09/20/16 0134)  magnesium citrate solution 1 Bottle (1 Bottle Oral Given 09/20/16 0340)  mineral oil enema 1 enema (1 enema Rectal Given 09/20/16 [id number])     Initial Impression / Assessment and Plan / ED Course  I have reviewed the triage vital signs and the nursing notes.  Pertinent labs & imaging results that were available during my care of the patient were reviewed by me and considered in my medical decision making (see chart for details).  Clinical Course    Patient was given mineral oil fleets enema and mag citrate.  States she only spelled the enema fluid, but she is feeling overall better.  She will be discharged home with instructions to use parallax on a regular basis for the next 7-10 days    Final Clinical Impressions(s) / ED Diagnoses   Final diagnoses:  Bilateral low back  pain without sciatica, unspecified chronicity  Constipation, unspecified constipation type    New Prescriptions New Prescriptions   POLYETHYLENE GLYCOL POWDER (GLYCOLAX/MIRALAX) POWDER    Take 17 g by mouth daily.   I personally performed the services described in this documentation, which was scribed in my presence. The recorded information has been reviewed and is accurate.    Misty FavorGail Glover Capano, NP 09/20/16 [id number]    Misty FavorGail Jael Kostick, NP 09/20/16 [id number]    Mancel BaleElliott Wentz, MD 09/20/16 [phone]

## 2016-09-19 NOTE — ED Triage Notes (Signed)
Pt states she has pain all over in her back  Pt states [facility] and was admitted and had several tests run but they could not find out what is causing her pain  Pt denies injury

## 2016-09-20 ENCOUNTER — Emergency Department (HOSPITAL_COMMUNITY): Payer: Self-pay

## 2016-09-20 LAB — URINALYSIS, ROUTINE W REFLEX MICROSCOPIC
BILIRUBIN URINE: NEGATIVE
GLUCOSE, UA: NEGATIVE mg/dL
HGB URINE DIPSTICK: NEGATIVE
KETONES UR: NEGATIVE mg/dL
Leukocytes, UA: NEGATIVE
Nitrite: NEGATIVE
PH: 7 (ref 5.0–8.0)
Protein, ur: NEGATIVE mg/dL
Specific Gravity, Urine: 1.003 — ABNORMAL LOW (ref 1.005–1.030)

## 2016-09-20 LAB — PREGNANCY, URINE: Preg Test, Ur: NEGATIVE

## 2016-09-20 MED ORDER — KETOROLAC TROMETHAMINE 30 MG/ML IJ SOLN
30.0000 mg | Freq: Once | INTRAMUSCULAR | Status: AC
  Administered 2016-09-20: 30 mg via INTRAMUSCULAR
  Filled 2016-09-20: qty 1

## 2016-09-20 MED ORDER — FENTANYL CITRATE (PF) 100 MCG/2ML IJ SOLN: 50.0000 ug | Freq: Once | INTRAMUSCULAR | Status: DC

## 2016-09-20 MED ORDER — MINERAL OIL RE ENEM
1.0000 | ENEMA | Freq: Once | RECTAL | Status: AC
  Administered 2016-09-20: 1 via RECTAL
  Filled 2016-09-20: qty 1

## 2016-09-20 MED ORDER — POLYETHYLENE GLYCOL 3350 17 GM/SCOOP PO POWD
17.0000 g | Freq: Every day | ORAL | 0 refills | Status: DC
Start: 2016-09-20 — End: 2016-12-06

## 2016-09-20 MED ORDER — TRAMADOL HCL 50 MG PO TABS: 50.0000 mg | ORAL_TABLET | Freq: Four times a day (QID) | ORAL | 0 refills | Status: DC | PRN

## 2016-09-20 MED ORDER — MAGNESIUM CITRATE PO SOLN
1.0000 | Freq: Once | ORAL | Status: AC
  Administered 2016-09-20: 1 via ORAL
  Filled 2016-09-20: qty 296

## 2016-09-20 NOTE — Discharge Instructions (Signed)
I recommend that you use the mail-in a regular basis with twice a day and he started having regular bowel movements and then daily.  Increase the amount of fluids to drink as well as green leafy vegetables, fibrous fruits and whole gr

## 2016-09-26 ENCOUNTER — Telehealth: Payer: Self-pay

## 2016-09-26 NOTE — Telephone Encounter (Signed)
Called, spoke with patient advised that we would not be able to change medication without an appointment. She has an appointment scheduled for 10/14/16, I asked her to keep this appointment and if the problem worsened to call us back or go to Urgent care. Patient verbalized understanding. Thanks!

## 2016-09-28 ENCOUNTER — Emergency Department (HOSPITAL_COMMUNITY)
Admission: EM | Admit: 2016-09-28 | Discharge: 2016-09-28 | Disposition: A | Discharge status: Home / Self Care | Payer: Self-pay | Attending: Dermatology | Admitting: Dermatology

## 2016-09-28 ENCOUNTER — Encounter (HOSPITAL_COMMUNITY): Payer: Self-pay | Admitting: *Deleted

## 2016-09-28 DIAGNOSIS — M549 Dorsalgia, unspecified: Secondary | ICD-10-CM | POA: Insufficient documentation

## 2016-09-28 DIAGNOSIS — Z5321 Procedure and treatment not carried out due to patient leaving prior to being seen by health care provider: Secondary | ICD-10-CM | POA: Insufficient documentation

## 2016-09-28 DIAGNOSIS — F17210 Nicotine dependence, cigarettes, uncomplicated: Secondary | ICD-10-CM | POA: Insufficient documentation

## 2016-09-28 DIAGNOSIS — E119 Type 2 diabetes mellitus without complications: Secondary | ICD-10-CM | POA: Insufficient documentation

## 2016-09-28 DIAGNOSIS — I10 Essential (primary) hypertension: Secondary | ICD-10-CM | POA: Insufficient documentation

## 2016-09-28 DIAGNOSIS — Z7984 Long term (current) use of oral hypoglycemic drugs: Secondary | ICD-10-CM | POA: Insufficient documentation

## 2016-09-28 NOTE — ED Triage Notes (Signed)
Pt stated "I was seen here last week for my back, they gave me tramadol, it makes me sick on my stomach.  I tried to call my doctor, but she is on vacation.  [facility] and here last week they did x-rays.  They dx'd me here as constipated."

## 2016-09-28 NOTE — ED Notes (Signed)
Pt reported to Registration that she was leaving and returned labels. 

## 2016-09-30 ENCOUNTER — Encounter (HOSPITAL_COMMUNITY): Payer: Self-pay

## 2016-09-30 ENCOUNTER — Emergency Department (HOSPITAL_COMMUNITY): Payer: Self-pay

## 2016-09-30 ENCOUNTER — Emergency Department (HOSPITAL_COMMUNITY)
Admission: EM | Admit: 2016-09-30 | Discharge: 2016-09-30 | Disposition: A | Discharge status: Home / Self Care | Payer: Self-pay | Attending: Emergency Medicine | Admitting: Emergency Medicine

## 2016-09-30 DIAGNOSIS — M546 Pain in thoracic spine: Secondary | ICD-10-CM

## 2016-09-30 DIAGNOSIS — Z79899 Other long term (current) drug therapy: Secondary | ICD-10-CM | POA: Insufficient documentation

## 2016-09-30 DIAGNOSIS — F17200 Nicotine dependence, unspecified, uncomplicated: Secondary | ICD-10-CM | POA: Insufficient documentation

## 2016-09-30 DIAGNOSIS — Z7984 Long term (current) use of oral hypoglycemic drugs: Secondary | ICD-10-CM | POA: Insufficient documentation

## 2016-09-30 DIAGNOSIS — E119 Type 2 diabetes mellitus without complications: Secondary | ICD-10-CM | POA: Insufficient documentation

## 2016-09-30 DIAGNOSIS — I10 Essential (primary) hypertension: Secondary | ICD-10-CM | POA: Insufficient documentation

## 2016-09-30 DIAGNOSIS — M5124 Other intervertebral disc displacement, thoracic region: Secondary | ICD-10-CM | POA: Insufficient documentation

## 2016-09-30 LAB — CBC WITH DIFFERENTIAL/PLATELET
Basophils Absolute: 0 10*3/uL (ref 0.0–0.1)
Basophils Relative: 0 %
EOS ABS: 0.1 10*3/uL (ref 0.0–0.7)
EOS PCT: 2 %
HCT: 36.2 % (ref 36.0–46.0)
Hemoglobin: 12.1 g/dL (ref 12.0–15.0)
LYMPHS ABS: 2.4 10*3/uL (ref 0.7–4.0)
Lymphocytes Relative: 44 %
MCH: 29.9 pg (ref 26.0–34.0)
MCHC: 33.4 g/dL (ref 30.0–36.0)
MCV: 89.4 fL (ref 78.0–100.0)
MONO ABS: 0.4 10*3/uL (ref 0.1–1.0)
MONOS PCT: 7 %
Neutro Abs: 2.5 10*3/uL (ref 1.7–7.7)
Neutrophils Relative %: 47 %
PLATELETS: 172 10*3/uL (ref 150–400)
RBC: 4.05 MIL/uL (ref 3.87–5.11)
RDW: 15.4 % (ref 11.5–15.5)
WBC: 5.3 10*3/uL (ref 4.0–10.5)

## 2016-09-30 LAB — BASIC METABOLIC PANEL
Anion gap: 7 (ref 5–15)
BUN: 8 mg/dL (ref 6–20)
CALCIUM: 9.1 mg/dL (ref 8.9–10.3)
CO2: 25 mmol/L (ref 22–32)
CREATININE: 0.7 mg/dL (ref 0.44–1.00)
Chloride: 106 mmol/L (ref 101–111)
GFR calc Af Amer: >60 mL/min (ref >60)
Glucose, Bld: 128 mg/dL — ABNORMAL HIGH (ref 65–99)
Potassium: 4.6 mmol/L (ref 3.5–5.1)
SODIUM: 138 mmol/L (ref 135–145)

## 2016-09-30 MED ORDER — HYDROMORPHONE HCL 2 MG/ML IJ SOLN
1.0000 mg | Freq: Once | INTRAMUSCULAR | Status: AC
  Administered 2016-09-30: 1 mg via INTRAVENOUS
  Filled 2016-09-30: qty 1

## 2016-09-30 MED ORDER — HYDROCODONE-ACETAMINOPHEN 5-325 MG PO TABS: 1.0000 | ORAL_TABLET | ORAL | 0 refills | Status: DC | PRN

## 2016-09-30 MED ORDER — HYDROCODONE-ACETAMINOPHEN 5-325 MG PO TABS
2.0000 | ORAL_TABLET | Freq: Once | ORAL | Status: AC
  Administered 2016-09-30: 2 via ORAL
  Filled 2016-09-30: qty 2

## 2016-09-30 MED ORDER — DIAZEPAM 5 MG PO TABS
5.0000 mg | ORAL_TABLET | Freq: Once | ORAL | Status: AC
  Administered 2016-09-30: 5 mg via ORAL
  Filled 2016-09-30: qty 1

## 2016-09-30 NOTE — ED Notes (Signed)
Dr. Danae ChenZavit notified on pt.'s elevated blood pressure.

## 2016-09-30 NOTE — ED Provider Notes (Signed)
[facility]-EMERGENCY DEPT Provider Note   CSN: [id number] Arrival date & time: 09/30/16  1625     History   Chief Complaint Chief Complaint  Patient presents with  . Back Pain    HPI Misty Mccullough is a 45 y.o. female.  Patient presents with worsening thoracic back pain radiating under both ribs with numbness sensation. Patient has had this worsening for over 2 months and has had evaluation with CT scan which was told was unremarkable. Patient is given tramadol and makes her nauseated and has not helped her pain. She cannot get into her doctor who is on medication. Patient feels she is only getting worse. No focal weakness or bowel or bladder changes except for mild constipation from pain meds. Patient has history of diabetes and neuropathy cholesterol elevated blood pressure. Pain worse with movement      Past Medical History:  Diagnosis Date  . Diabetes mellitus without complication (HCC)   . Hypercholesteremia   . Hypertension     Patient Active Problem List   Diagnosis Date Noted  . Malnutrition of moderate degree 08/14/2016  . Chest pain 08/13/2016  . Back pain 08/13/2016  . Essential hypertension 08/13/2016  . HLD (hyperlipidemia) 08/13/2016  . Diabetes mellitus with complication (HCC) 08/13/2016  . Chest pain, exertional     Past Surgical History:  Procedure Laterality Date  . TUBAL LIGATION      OB History    No data available       Home Medications    Prior to Admission medications   Medication Sig Start Date End Date Taking? Authorizing Provider  acetaminophen (TYLENOL) 325 MG tablet Take 2 tablets (650 mg total) by mouth every 6 (six) hours as needed for mild pain (or Fever >/= 101). 08/14/16   Belkys A Regalado, MD  feeding supplement, ENSURE ENLIVE, (ENSURE ENLIVE) LIQD Take 237 mLs by mouth 2 (two) times daily between meals. 08/14/16   Belkys A Regalado, MD  fluticasone (FLONASE) 50 MCG/ACT nasal spray Place 2 sprays into both nostrils daily. 09/06/16    Henrietta HooverLinda C Bernhardt, NP  HYDROcodone-acetaminophen (NORCO) 5-325 MG tablet Take 1-2 tablets by mouth every 4 (four) hours as needed. 09/30/16   Blane OharaJoshua Yahmir Sokolov, MD  lisinopril (PRINIVIL,ZESTRIL) 10 MG tablet Take 1 tablet (10 mg total) by mouth daily. 09/06/16   Henrietta HooverLinda C Bernhardt, NP  metFORMIN (GLUCOPHAGE) 500 MG tablet Take 1 tablet (500 mg total) by mouth 2 (two) times daily with a meal. 09/06/16   Henrietta HooverLinda C Bernhardt, NP  Multiple Vitamin (MULTIVITAMIN WITH MINERALS) TABS tablet Take 1 tablet by mouth daily.    Historical Provider, MD  omeprazole (PRILOSEC) 40 MG capsule Take 1 capsule (40 mg total) by mouth daily. 09/09/16   Henrietta HooverLinda C Bernhardt, NP  polyethylene glycol powder (GLYCOLAX/MIRALAX) powder Take 17 g by mouth daily. 09/20/16   Earley FavorGail Schulz, NP  pravastatin (PRAVACHOL) 20 MG tablet Take 1 tablet (20 mg total) by mouth daily at 6 PM. 09/06/16   Henrietta HooverLinda C Bernhardt, NP  traMADol (ULTRAM) 50 MG tablet Take 1 tablet (50 mg total) by mouth every 6 (six) hours as needed for severe pain. 09/20/16   Earley FavorGail Schulz, NP    Family History Family History  Problem Relation Age of Onset  . Diabetes Mother   . Uterine cancer Mother   . Diabetes Father   . Heart attack Father [id number]  . Stroke Father   . Heart attack Paternal Grandmother   . Heart attack Paternal Grandfather  Social History Social History  Substance Use Topics  . Smoking status: Current Every Day Smoker    Packs/day: 1.00  . Smokeless tobacco: Never Used  . Alcohol use No     Allergies   Patient has no known allergies.   Review of Systems Review of Systems  Constitutional: Negative for chills and fever.  HENT: Negative for congestion.   Eyes: Negative for visual disturbance.  Respiratory: Negative for shortness of breath.   Cardiovascular: Negative for chest pain.  Gastrointestinal: Negative for abdominal pain and vomiting.  Genitourinary: Negative for dysuria and flank pain.  Musculoskeletal: Positive for back pain.  Negative for neck pain and neck stiffness.  Skin: Negative for rash.  Neurological: Positive for numbness. Negative for weakness, light-headedness and headaches.     Physical Exam Updated Vital Signs BP 185/89   Pulse 80   Temp 97.8 F (36.6 C) (Oral)   Resp 16   LMP 09/14/2016 (Approximate) Comment:  neg preg test  SpO2 97%   Physical Exam  Constitutional: She appears well-developed and well-nourished. No distress.  HENT:  Head: Normocephalic and atraumatic.  Eyes: Conjunctivae are normal.  Neck: Neck supple.  Cardiovascular: Normal rate and regular rhythm.   No murmur heard. Pulmonary/Chest: Effort normal and breath sounds normal. No respiratory distress.  Abdominal: Soft. There is no tenderness.  Musculoskeletal: She exhibits tenderness. She exhibits no edema.  Tender paraspinal and midline lower and mid thoracic.    Neurological: She is alert. No cranial nerve deficit. GCS eye subscore is 4. GCS verbal subscore is 5. GCS motor subscore is 6.  Reflex Scores:      Patellar reflexes are 2+ on the right side and 2+ on the left side.      Achilles reflexes are 2+ on the right side and 2+ on the left side. Patient has 5+ strength lower extremities with flexion extension major joints also in upper extremities. Patient's sensation to palpation intact upper and lower extremities bilateral.  Skin: Skin is warm and dry.  Psychiatric: She has a normal mood and affect.  Nursing note and vitals reviewed.    ED Treatments / Results  Labs (all labs ordered are listed, but only abnormal results are displayed) Labs Reviewed  BASIC METABOLIC PANEL - Abnormal; Notable for the following:       Result Value   Glucose, Bld 128 (*)    All other components within normal limits  CBC WITH DIFFERENTIAL/PLATELET    EKG  EKG Interpretation None       Radiology Dg Chest 2 View  Result Date: 09/30/2016 CLINICAL DATA:  Bilateral chest pain EXAM: CHEST  2 VIEW COMPARISON:  09/20/2016  FINDINGS: The heart size and mediastinal contours are within normal limits. Both lungs are clear. The visualized skeletal structures are unremarkable. IMPRESSION: No active cardiopulmonary disease. Electronically Signed   By: Alcide CleverMark  Lukens M.D.   On: 09/30/2016 19:01   Mr Thoracic Spine Wo Contrast  Result Date: 09/30/2016 CLINICAL DATA:  Back pain and constipation EXAM: MRI THORACIC AND LUMBAR SPINE WITHOUT CONTRAST TECHNIQUE: Multiplanar and multiecho pulse sequences of the thoracic and lumbar spine were obtained without intravenous contrast. COMPARISON:  CT thoracic spine 08/13/2016 FINDINGS: MRI THORACIC SPINE FINDINGS Alignment:  Normal Vertebrae: There are multiple Schmorl's nodes at the endplates of the lower thoracic vertebrae. There are no focal marrow lesions. No discitis osteomyelitis or epidural collection. No compression fracture. Cord:  Normal signal and caliber. Paraspinal and other soft tissues: Negative. Disc levels: T5-T6: Small  right subarticular disc protrusion narrows the ventral thecal sac and contacts the right anterior aspect of the spinal cord. No central spinal canal or neural foraminal stenosis. T6-T7: Small right subarticular disc protrusion narrows the ventral thecal sac. No spinal canal or neural foraminal stenosis. T7-T8: Right subarticular disc protrusion narrows the ventral thecal sac and contacts the anterior cord. No central spinal canal stenosis or neural foraminal stenosis. T8-T9: Left subarticular disc extrusion with mild superior migration. No spinal canal or neural foraminal stenosis. There is no spinal canal or neural foraminal stenosis or significant disc bulge at the other thoracic levels. MRI LUMBAR SPINE FINDINGS Segmentation:  Normal Alignment:  Normal Vertebrae: No acute compression fracture, facet edema or focal marrow lesion. Conus medullaris: Extends to the L2 level and appears normal. Paraspinal and other soft tissues: 1.2 centimeter left renal cyst. Otherwise  normal. Disc levels: T12-L1: Normal disc space and facets. No spinal canal or neuroforaminal stenosis. L1-L2: Normal disc space and facets. No spinal canal or neuroforaminal stenosis. L2-L3: Normal disc space and facets. No spinal canal or neuroforaminal stenosis. L3-L4: Normal disc space and facets. No spinal canal or neuroforaminal stenosis. L4-L5: Small central disc protrusion with annular fissure. No spinal canal or neural foraminal stenosis. L5-S1: Normal disc space and facets. No spinal canal or neuroforaminal stenosis. IMPRESSION: MR THORACIC SPINE IMPRESSION Multilevel mid thoracic degenerative disc disease with small disc protrusions that contact the ventral surface of the spinal cord. No spinal canal or neural foraminal stenosis. MR LUMBAR SPINE IMPRESSION Small central disc protrusion and annular fissure at L4-L5. No lumbar spinal canal or neural foraminal stenosis. Electronically Signed   By: Deatra Robinson M.D.   On: 09/30/2016 21:56   Mr Lumbar Spine Wo Contrast  Result Date: 09/30/2016 CLINICAL DATA:  Back pain and constipation EXAM: MRI THORACIC AND LUMBAR SPINE WITHOUT CONTRAST TECHNIQUE: Multiplanar and multiecho pulse sequences of the thoracic and lumbar spine were obtained without intravenous contrast. COMPARISON:  CT thoracic spine 08/13/2016 FINDINGS: MRI THORACIC SPINE FINDINGS Alignment:  Normal Vertebrae: There are multiple Schmorl's nodes at the endplates of the lower thoracic vertebrae. There are no focal marrow lesions. No discitis osteomyelitis or epidural collection. No compression fracture. Cord:  Normal signal and caliber. Paraspinal and other soft tissues: Negative. Disc levels: T5-T6: Small right subarticular disc protrusion narrows the ventral thecal sac and contacts the right anterior aspect of the spinal cord. No central spinal canal or neural foraminal stenosis. T6-T7: Small right subarticular disc protrusion narrows the ventral thecal sac. No spinal canal or neural  foraminal stenosis. T7-T8: Right subarticular disc protrusion narrows the ventral thecal sac and contacts the anterior cord. No central spinal canal stenosis or neural foraminal stenosis. T8-T9: Left subarticular disc extrusion with mild superior migration. No spinal canal or neural foraminal stenosis. There is no spinal canal or neural foraminal stenosis or significant disc bulge at the other thoracic levels. MRI LUMBAR SPINE FINDINGS Segmentation:  Normal Alignment:  Normal Vertebrae: No acute compression fracture, facet edema or focal marrow lesion. Conus medullaris: Extends to the L2 level and appears normal. Paraspinal and other soft tissues: 1.2 centimeter left renal cyst. Otherwise normal. Disc levels: T12-L1: Normal disc space and facets. No spinal canal or neuroforaminal stenosis. L1-L2: Normal disc space and facets. No spinal canal or neuroforaminal stenosis. L2-L3: Normal disc space and facets. No spinal canal or neuroforaminal stenosis. L3-L4: Normal disc space and facets. No spinal canal or neuroforaminal stenosis. L4-L5: Small central disc protrusion with annular fissure. No spinal canal  or neural foraminal stenosis. L5-S1: Normal disc space and facets. No spinal canal or neuroforaminal stenosis. IMPRESSION: MR THORACIC SPINE IMPRESSION Multilevel mid thoracic degenerative disc disease with small disc protrusions that contact the ventral surface of the spinal cord. No spinal canal or neural foraminal stenosis. MR LUMBAR SPINE IMPRESSION Small central disc protrusion and annular fissure at L4-L5. No lumbar spinal canal or neural foraminal stenosis. Electronically Signed   By: Deatra Robinson M.D.   On: 09/30/2016 21:56    Procedures Procedures (including critical care time)  Medications Ordered in ED Medications  HYDROcodone-acetaminophen (NORCO/VICODIN) 5-325 MG per tablet 2 tablet (not administered)  HYDROmorphone (DILAUDID) injection 1 mg (1 mg Intravenous Given 09/30/16 1918)  diazepam  (VALIUM) tablet 5 mg (5 mg Oral Given 09/30/16 1918)     Initial Impression / Assessment and Plan / ED Course  I have reviewed the triage vital signs and the nursing notes.  Pertinent labs & imaging results that were available during my care of the patient were reviewed by me and considered in my medical decision making (see chart for details).  Clinical Course    Patient presents with worsening midline thoracic back pain severe causing her to have difficulty with ambulation due to pain. With worsening symptoms despite recent CT scan planned for MRI for further evaluation of this pain. Pain meds and blood work ordered. MRI results reviewed showing multilevel arthritis/small disc herniations which may be causing patient's pain. Discussed close follow-up with orthopedics. Normal neurologic examination in ED.  Results and differential diagnosis were discussed with the patient/parent/guardian. Xrays were independently reviewed by myself.  Close follow up outpatient was discussed, comfortable with the plan.   Medications  HYDROcodone-acetaminophen (NORCO/VICODIN) 5-325 MG per tablet 2 tablet (not administered)  HYDROmorphone (DILAUDID) injection 1 mg (1 mg Intravenous Given 09/30/16 1918)  diazepam (VALIUM) tablet 5 mg (5 mg Oral Given 09/30/16 1918)    Vitals:   09/30/16 2121 09/30/16 2130 09/30/16 2145 09/30/16 2200  BP: 178/85 168/95 174/84 185/89  Pulse: 72 91 80 80  Resp: 16     Temp:      TempSrc:      SpO2: 98% 98% 97% 97%    Final diagnoses:  Thoracic back pain  Thoracic disc herniation     Final Clinical Impressions(s) / ED Diagnoses   Final diagnoses:  Thoracic back pain  Thoracic disc herniation    New Prescriptions New Prescriptions   HYDROCODONE-ACETAMINOPHEN (NORCO) 5-325 MG TABLET    Take 1-2 tablets by mouth every 4 (four) hours as needed.     Blane Ohara, MD 09/30/16 [phone]

## 2016-09-30 NOTE — Discharge Instructions (Signed)
Follow-up closely with orthopedic surgeon and primary doctor. Return to the ER. Develop bowel or bladder changes, weakness or persistent numbness and lower extremities.  If you were given medicines take as directed.  If you are on coumadin or contraceptives realize their levels and effectiveness is altered by many different medicines.  If you have any reaction (rash, tongues swelling, other) to the medicines stop taking and see a physician.    If your blood pressure was elevated in the ER make sure you follow up for management with a primary doctor or return for chest pain, shortness of breath or stroke symptoms.  Please follow up as directed and return to the ER or see a physician for new or worsening symptoms.  Thank you. Vitals:   09/30/16 2121 09/30/16 2130 09/30/16 2145 09/30/16 2200  BP: 178/85 168/95 174/84 185/89  Pulse: 72 91 80 80  Resp: 16     Temp:      TempSrc:      SpO2: 98% 98% 97% 97%

## 2016-09-30 NOTE — ED Notes (Signed)
Pt Currently at MRI  

## 2016-09-30 NOTE — ED Notes (Signed)
Patient placed on continuous pulse oximetry and blood pressure cuff; visitor at bedside 

## 2016-09-30 NOTE — ED Notes (Signed)
Brought patient back to room via wheelchair with family in tow; patient getting undressed and into a gown; visitor at bedside

## 2016-09-30 NOTE — ED Triage Notes (Signed)
Pt complaining of diffuse back pain. Pt states pain in upper, mid and lower back. Left and right side. Pt denies any trauma/injury.

## 2016-09-30 NOTE — ED Notes (Signed)
Delay in lab draw,  Pt not in room 

## 2016-10-07 ENCOUNTER — Encounter (HOSPITAL_COMMUNITY): Payer: Self-pay

## 2016-10-07 ENCOUNTER — Emergency Department (HOSPITAL_COMMUNITY)
Admission: EM | Admit: 2016-10-07 | Discharge: 2016-10-07 | Disposition: A | Discharge status: Home / Self Care | Payer: Self-pay | Attending: Emergency Medicine | Admitting: Emergency Medicine

## 2016-10-07 DIAGNOSIS — E119 Type 2 diabetes mellitus without complications: Secondary | ICD-10-CM | POA: Insufficient documentation

## 2016-10-07 DIAGNOSIS — M545 Low back pain, unspecified: Secondary | ICD-10-CM

## 2016-10-07 DIAGNOSIS — Z79899 Other long term (current) drug therapy: Secondary | ICD-10-CM | POA: Insufficient documentation

## 2016-10-07 DIAGNOSIS — F17200 Nicotine dependence, unspecified, uncomplicated: Secondary | ICD-10-CM | POA: Insufficient documentation

## 2016-10-07 DIAGNOSIS — I10 Essential (primary) hypertension: Secondary | ICD-10-CM | POA: Insufficient documentation

## 2016-10-07 DIAGNOSIS — G8929 Other chronic pain: Secondary | ICD-10-CM | POA: Insufficient documentation

## 2016-10-07 MED ORDER — DICLOFENAC SODIUM 1 % TD GEL: 4.0000 g | Freq: Four times a day (QID) | TRANSDERMAL | 0 refills | Status: DC

## 2016-10-07 MED ORDER — HYDROCODONE-ACETAMINOPHEN 5-325 MG PO TABS
2.0000 | ORAL_TABLET | Freq: Once | ORAL | Status: AC
Start: 2016-10-07 — End: 2016-10-07
  Administered 2016-10-07: 2 via ORAL
  Filled 2016-10-07: qty 2

## 2016-10-07 MED ORDER — METHOCARBAMOL 500 MG PO TABS: ORAL_TABLET | ORAL | 0 refills | Status: DC

## 2016-10-07 NOTE — Discharge Instructions (Signed)

## 2016-10-07 NOTE — ED Triage Notes (Signed)
Pt here for worsening back pain. Pt states she was Rx[place]3 [place] and is unable to tolerate those because they maker her nauseated. Pt is here requesting "stronger pain medication" and would like a list of orthopedic specialists to be seen sooner. Pt A+OX4, speaking in complete sentences, and ambulatory to triage.

## 2016-10-07 NOTE — ED Provider Notes (Signed)
[facility]-EMERGENCY DEPT Provider Note    By signing my name below, I, Misty Mccullough, attest that this documentation has been prepared under the direction and in the presence of [name], PA-C. Electronically Signed: [name], ED Scribe. 10/07/16. 4:15 PM.    History   Chief Complaint Chief Complaint  Patient presents with  . Back Pain   The history is provided by the patient and medical records. No language interpreter was used.    HPI Comments:  Misty DenseDebra Rathje is a 46 y.o. female with PMHx of chronic back pain, DM, HLD and HTN who presents to the Emergency Department complaining of worsening thoracic back pain that began worsening over the last three months. She was [facility] one week ago, received an MRI showing multilevel arthritis/small disc herniations and was given a referral to follow up with an orthopedist and prescribed Vicodin. She states she was seen by her [place] in [place] and was prescribed Tylenol #3 that she states makes her nauseous. She states her PCP told her to come to the ED for a different medication. Pt states she has an appt with Dr. Ophelia CharterYates in three weeks. She reports taking Ibuprofen with no significant relief of the pain. She denies modifying factors. She denies fever, chills, nausea, vomiting, numbness, tingling or weakness of the lower extremities, bowel or bladder incontinence. She denies any trauma, injury or fall. She reports she now has a PCP at [facility], Concepcion LivingLinda Mccullough, [place].  Past Medical History:  Diagnosis Date  . Diabetes mellitus without complication (HCC)   . Hypercholesteremia   . Hypertension     Patient Active Problem List   Diagnosis Date Noted  . Malnutrition of moderate degree 08/14/2016  . Chest pain 08/13/2016  . Back pain 08/13/2016  . Essential hypertension 08/13/2016  . HLD (hyperlipidemia) 08/13/2016  . Diabetes mellitus with complication (HCC) 08/13/2016  . Chest pain, exertional     Past  Surgical History:  Procedure Laterality Date  . TUBAL LIGATION      OB History    No data available       Home Medications    Prior to Admission medications   Medication Sig Start Date End Date Taking? Authorizing Provider  acetaminophen (TYLENOL) 325 MG tablet Take 2 tablets (650 mg total) by mouth every 6 (six) hours as needed for mild pain (or Fever >/= 101). 08/14/16   Belkys A Regalado, MD  diclofenac sodium (VOLTAREN) 1 % GEL Apply 4 g topically 4 (four) times daily. 10/07/16   Marney Treloar, PA-C  feeding supplement, ENSURE ENLIVE, (ENSURE ENLIVE) LIQD Take 237 mLs by mouth 2 (two) times daily between meals. 08/14/16   Belkys A Regalado, MD  fluticasone (FLONASE) 50 MCG/ACT nasal spray Place 2 sprays into both nostrils daily. 09/06/16   Henrietta HooverLinda C Bernhardt, NP  HYDROcodone-acetaminophen (NORCO) 5-325 MG tablet Take 1-2 tablets by mouth every 4 (four) hours as needed. 09/30/16   Blane OharaJoshua Zavitz, MD  lisinopril (PRINIVIL,ZESTRIL) 10 MG tablet Take 1 tablet (10 mg total) by mouth daily. 09/06/16   Henrietta HooverLinda C Bernhardt, NP  metFORMIN (GLUCOPHAGE) 500 MG tablet Take 1 tablet (500 mg total) by mouth 2 (two) times daily with a meal. 09/06/16   Henrietta HooverLinda C Bernhardt, NP  methocarbamol (ROBAXIN) 500 MG tablet Can take up to 1-2 tabs every 6 hours PRN PAIN 10/07/16   Joni ReiningNicole Zephyr Sausedo, PA-C  Multiple Vitamin (MULTIVITAMIN WITH MINERALS) TABS tablet Take 1 tablet by mouth daily.    Historical  Provider, MD  omeprazole (PRILOSEC) 40 MG capsule Take 1 capsule (40 mg total) by mouth daily. 09/09/16   Henrietta Hoover, NP  polyethylene glycol powder (GLYCOLAX/MIRALAX) powder Take 17 g by mouth daily. 09/20/16   Earley Favor, NP  pravastatin (PRAVACHOL) 20 MG tablet Take 1 tablet (20 mg total) by mouth daily at 6 PM. 09/06/16   Henrietta Hoover, NP  traMADol (ULTRAM) 50 MG tablet Take 1 tablet (50 mg total) by mouth every 6 (six) hours as needed for severe pain. 09/20/16   Earley Favor, NP    Family History Family  History  Problem Relation Age of Onset  . Diabetes Mother   . Uterine cancer Mother   . Diabetes Father   . Heart attack Father 52  . Stroke Father   . Heart attack Paternal Grandmother   . Heart attack Paternal Grandfather     Social History Social History  Substance Use Topics  . Smoking status: Current Every Day Smoker    Packs/day: 1.00  . Smokeless tobacco: Never Used  . Alcohol use No     Allergies   Patient has no known allergies.   Review of Systems Review of Systems A complete 10 system review of systems was obtained and all systems are negative except as noted in the HPI and PMH.    Physical Exam Updated Vital Signs BP 148/85 (BP Location: Right Arm)   Pulse 75   Temp 98.3 F (36.8 C) (Oral)   Resp 18   LMP 09/14/2016 (Approximate) Comment:  neg preg test  SpO2 99%   Physical Exam  Constitutional: She is oriented to person, place, and time. She appears well-developed and well-nourished.  HENT:  Head: Normocephalic and atraumatic.  Eyes: Conjunctivae are normal.  Neck: Normal range of motion.  Cardiovascular: Normal rate, regular rhythm and intact distal pulses.   Pulmonary/Chest: Effort normal.  Abdominal: Soft. There is no tenderness.  Musculoskeletal: Normal range of motion.  Neurological: She is alert and oriented to person, place, and time.  No point tenderness to percussion of lumbar spinal processes.  No TTP or paraspinal muscular spasm. Strength is 5 out of 5 to bilateral lower extremities at hip and knee; extensor hallucis longus 5 out of 5. Ankle strength 5 out of 5, no clonus, neurovascularly intact. No saddle anaesthesia. Patellar reflexes are 2+ bilaterally.      Skin: Skin is warm and dry.  Psychiatric: She has a normal mood and affect. Her behavior is normal.  Nursing note and vitals reviewed.    ED Treatments / Results  DIAGNOSTIC STUDIES: Oxygen Saturation is 99% on RA, normal by my interpretation.   COORDINATION OF CARE: 4:13  PM- Looked pt up on  controlled substance database that showed pt received 12/1 and 12/16 #20 Tramadol, 12/25 #20 Vicodin and 12/28 #14 Tylenol with Codeine. Explained to pt that her chronic pain will no longer be treated from the ED. Will prescribe muscle relaxer and Voltaren Gel. Will give referral to orthopedist so she can try to get an appt sooner. Pt verbalizes understanding and agrees to plan.  Medications  HYDROcodone-acetaminophen (NORCO/VICODIN) 5-325 MG per tablet 2 tablet (not administered)    Labs (all labs ordered are listed, but only abnormal results are displayed) Labs Reviewed - No data to display  EKG  EKG Interpretation None       Radiology No results found.  Procedures Procedures (including critical care time)  Medications Ordered in ED Medications  HYDROcodone-acetaminophen (NORCO/VICODIN) 5-325 MG  per tablet 2 tablet (not administered)     Initial Impression / Assessment and Plan / ED Course  I have reviewed the triage vital signs and the nursing notes.  Pertinent labs & imaging results that were available during my care of the patient were reviewed by me and considered in my medical decision making (see chart for details).  Clinical Course     Vitals:   10/07/16 1335 10/07/16 1635  BP: 148/85   Pulse: 75 88  Resp: 18   Temp: 98.3 F (36.8 C)   TempSrc: Oral   SpO2: 99% 98%    Medications  HYDROcodone-acetaminophen (NORCO/VICODIN) 5-325 MG per tablet 2 tablet (2 tablets Oral Given 10/07/16 1634)    Era Parr is 46 y.o. female presenting with Exacerbation of chronic low back pain, recent MRI with no acute findings, pain is consistent with her baseline discomfort. She has received multiple narcotic pain medications in the last week, the explained to her that we cannot continue to feel necrotic plane medications out of the ED. Advised her to follow closely with PCP and ortho.  Evaluation does not show pathology that would require ongoing  emergent intervention or inpatient treatment. Pt is hemodynamically stable and mentating appropriately. Discussed findings and plan with patient/guardian, who agrees with care plan. All questions answered. Return precautions discussed and outpatient follow up given.   I personally performed the services described in this documentation, which was scribed in my presence. The recorded information has been reviewed and is accurate.   Final Clinical Impressions(s) / ED Diagnoses   Final diagnoses:  Chronic low back pain, unspecified back pain laterality, with sciatica presence unspecified  Low back pain without sciatica, unspecified back pain laterality, unspecified chronicity    New Prescriptions New Prescriptions   DICLOFENAC SODIUM (VOLTAREN) 1 % GEL    Apply 4 g topically 4 (four) times daily.   METHOCARBAMOL (ROBAXIN) 500 MG TABLET    Can take up to 1-2 tabs every 6 hours PRN PAIN     Wynetta Emery, PA-C 10/07/16 2002    Laurence Spates, MD 10/08/16 1108

## 2016-10-11 ENCOUNTER — Encounter (INDEPENDENT_AMBULATORY_CARE_PROVIDER_SITE_OTHER): Payer: Self-pay | Admitting: Orthopaedic Surgery

## 2016-10-11 ENCOUNTER — Ambulatory Visit (INDEPENDENT_AMBULATORY_CARE_PROVIDER_SITE_OTHER): Payer: Self-pay | Admitting: Orthopaedic Surgery

## 2016-10-11 DIAGNOSIS — M545 Low back pain, unspecified: Secondary | ICD-10-CM

## 2016-10-11 DIAGNOSIS — G8929 Other chronic pain: Secondary | ICD-10-CM

## 2016-10-11 NOTE — Progress Notes (Signed)
   Office Visit Note   Patient: Misty Mccullough           Date of Birth: 05/22/1971           MRN: [id number] Visit Date: 10/11/2016              Requested by: Henrietta HooverLinda C Bernhardt, NP [address] [place], [place] [id number] PCP: Concepcion LivingBERNHARDT, LINDA, NP   Assessment & Plan: Visit Diagnoses:  1. Chronic midline low back pain without sciatica     Plan: She essentially has a normal thoracic and lumbar spine more I which I reviewed.  I did recommend that she go to physical therapy. He requested narcotic pain medicines which I am not inclined to do. We have referred her to a pain clinic. Follow-up with me as needed.  Follow-Up Instructions: Return if symptoms worsen or fail to improve.   Orders:  Orders Placed This Encounter  Procedures  . Ambulatory referral to Physical Therapy   No orders of the defined types were placed in this encounter.     Procedures: No procedures performed   Clinical Data: No additional findings.   Subjective: Chief Complaint  Patient presents with  . Middle Back - Pain  . Lower Back - Pain    Patient is a 46 year old female who has constant 10 out of 10 mid and low back pain for 3 months that radiates into her ribs bilaterally. She endorses burning and tingling. She has had MRIs of her thoracic and lumbar spine on Christmas of this year. She denies any injuries. She denies any radicular symptoms or focal or motor sensory deficits. Denies any constitutional symptoms or red flag symptoms.    Review of Systems Complete review of systems is negative except for history of present illness  Objective: Vital Signs: LMP 09/14/2016 (Approximate) Comment:  neg preg test  Physical Exam Well-developed well-nourished no distress alert and 3 nonlabored breathing normal judgments affect abdomen soft no lymphadenopathy. Patient appears much older than her stated age likely secondary to cigarette use.  Ortho Exam Exam of bilateral upper and lower extremities is normal.  She has nonspecific tenderness of her spine. Her reflexes are symmetric.  Specialty Comments:  No specialty comments available.  Imaging: No results found.   PMFS History: Patient Active Problem List   Diagnosis Date Noted  . Malnutrition of moderate degree 08/14/2016  . Chest pain 08/13/2016  . Back pain 08/13/2016  . Essential hypertension 08/13/2016  . HLD (hyperlipidemia) 08/13/2016  . Diabetes mellitus with complication (HCC) 08/13/2016  . Chest pain, exertional    Past Medical History:  Diagnosis Date  . Diabetes mellitus without complication (HCC)   . Hypercholesteremia   . Hypertension     Family History  Problem Relation Age of Onset  . Diabetes Mother   . Uterine cancer Mother   . Diabetes Father   . Heart attack Father 1062  . Stroke Father   . Heart attack Paternal Grandmother   . Heart attack Paternal Grandfather     Past Surgical History:  Procedure Laterality Date  . TUBAL LIGATION     Social History   Occupational History  . Not on file.   Social History Main Topics  . Smoking status: Current Every Day Smoker    Packs/day: 1.00  . Smokeless tobacco: Never Used  . Alcohol use No  . Drug use: No  . Sexual activity: Not on file

## 2016-10-14 ENCOUNTER — Other Ambulatory Visit (HOSPITAL_COMMUNITY)
Admission: RE | Admit: 2016-10-14 | Discharge: 2016-10-14 | Disposition: A | Discharge status: Home / Self Care | Payer: Self-pay | Source: Ambulatory Visit | Attending: Family Medicine | Admitting: Family Medicine

## 2016-10-14 ENCOUNTER — Ambulatory Visit (INDEPENDENT_AMBULATORY_CARE_PROVIDER_SITE_OTHER): Payer: Self-pay | Admitting: Family Medicine

## 2016-10-14 ENCOUNTER — Encounter: Payer: Self-pay | Admitting: Family Medicine

## 2016-10-14 VITALS — BP 168/76 | HR 66 | Temp 97.8°F | Resp 14 | Ht 64.0 in | Wt 133.0 lb

## 2016-10-14 DIAGNOSIS — Z01419 Encounter for gynecological examination (general) (routine) without abnormal findings: Secondary | ICD-10-CM | POA: Insufficient documentation

## 2016-10-14 DIAGNOSIS — Z124 Encounter for screening for malignant neoplasm of cervix: Secondary | ICD-10-CM

## 2016-10-14 DIAGNOSIS — Z113 Encounter for screening for infections with a predominantly sexual mode of transmission: Secondary | ICD-10-CM | POA: Insufficient documentation

## 2016-10-14 MED ORDER — LISINOPRIL 10 MG PO TABS: 10.0000 mg | ORAL_TABLET | Freq: Every day | ORAL | 0 refills | Status: DC

## 2016-10-14 MED ORDER — OMEPRAZOLE 40 MG PO CPDR: 40.0000 mg | DELAYED_RELEASE_CAPSULE | Freq: Every day | ORAL | 3 refills | Status: DC

## 2016-10-14 MED ORDER — PRAVASTATIN SODIUM 20 MG PO TABS: 20.0000 mg | ORAL_TABLET | Freq: Every day | ORAL | 0 refills | Status: DC

## 2016-10-14 NOTE — Progress Notes (Signed)
Misty DenseDebra Mccullough, is a 46 y.o. female  UJW:[id number]:[id number]  [id number]  DOB - [birth year]  CC:  Chief Complaint  Patient presents with  . pap smear  . Hypertension    recheck b/p, needs refills and pravastatin  . Back Pain    needs pain meds, went to Ortho he would not provide rx rates pain 10/10       HPI: Misty Mccullough is a 46 y.o. female here for PAP smear only. She was seen for other issues in late December and asked to return for PAP as it has been about 18  Years since her last PAP. I ordered a mammogram at that visit and she is awaiting that.  She reports regular periods without breakthrough bleeding. She denies discharge or pain.   No Known Allergies Past Medical History:  Diagnosis Date  . Diabetes mellitus without complication (HCC)   . Hypercholesteremia   . Hypertension    Current Outpatient Prescriptions on File Prior to Visit  Medication Sig Dispense Refill  . diclofenac sodium (VOLTAREN) 1 % GEL Apply 4 g topically 4 (four) times daily. 100 g 0  . feeding supplement, ENSURE ENLIVE, (ENSURE ENLIVE) LIQD Take 237 mLs by mouth 2 (two) times daily between meals. 237 mL 12  . fluticasone (FLONASE) 50 MCG/ACT nasal spray Place 2 sprays into both nostrils daily. 16 g 0  . metFORMIN (GLUCOPHAGE) 500 MG tablet Take 1 tablet (500 mg total) by mouth 2 (two) times daily with a meal. 60 tablet 0  . Multiple Vitamin (MULTIVITAMIN WITH MINERALS) TABS tablet Take 1 tablet by mouth daily.    . polyethylene glycol powder (GLYCOLAX/MIRALAX) powder Take 17 g by mouth daily. 255 g 0  . acetaminophen (TYLENOL) 325 MG tablet Take 2 tablets (650 mg total) by mouth every 6 (six) hours as needed for mild pain (or Fever >/= 101). (Patient not taking: Reported on 10/14/2016) 20 tablet 0  . HYDROcodone-acetaminophen (NORCO) 5-325 MG tablet Take 1-2 tablets by mouth every 4 (four) hours as needed. (Patient not taking: Reported on 10/14/2016) 20 tablet 0  . methocarbamol (ROBAXIN) 500 MG tablet Can take up  to 1-2 tabs every 6 hours PRN PAIN (Patient not taking: Reported on 10/14/2016) 20 tablet 0  . traMADol (ULTRAM) 50 MG tablet Take 1 tablet (50 mg total) by mouth every 6 (six) hours as needed for severe pain. (Patient not taking: Reported on 10/14/2016) 20 tablet 0   No current facility-administered medications on file prior to visit.    Family History  Problem Relation Age of Onset  . Diabetes Mother   . Uterine cancer Mother   . Diabetes Father   . Heart attack Father [phone]  . Stroke Father   . Heart attack Paternal Grandmother   . Heart attack Paternal Grandfather    Social History   Social History  . Marital status: Married    Spouse name: N/A  . Number of children: N/A  . Years of education: N/A   Occupational History  . Not on file.   Social History Main Topics  . Smoking status: Current Every Day Smoker    Packs/day: 1.00  . Smokeless tobacco: Never Used  . Alcohol use No  . Drug use: No  . Sexual activity: Not on file   Other Topics Concern  . Not on file   Social History Narrative  . No narrative on file     Vitals:   10/14/16 0931  BP: (!) 168/76  Pulse: 66  Resp: 14  Temp: 97.8 F (36.6 C)    GYN exam:   Cervix is midline, there is no unusual discharge. There is very mild inflammation. She endorses both CMT and bilateral adnexal tenderness.   Lab Results  Component Value Date   WBC 5.3 09/30/2016   HGB 12.1 09/30/2016   HCT 36.2 09/30/2016   MCV 89.4 09/30/2016   PLT 172 09/30/2016   Lab Results  Component Value Date   CREATININE 0.70 09/30/2016   BUN 8 09/30/2016   NA 138 09/30/2016   K 4.6 09/30/2016   CL 106 09/30/2016   CO2 25 09/30/2016    Lab Results  Component Value Date   HGBA1C 7.3 (H) 08/13/2016   Lipid Panel     Component Value Date/Time   CHOL 192 08/13/2016 0833   TRIG 64 08/13/2016 0833   HDL 41 08/13/2016 0833   CHOLHDL 4.7 08/13/2016 0833   VLDL 13 08/13/2016 0833   LDLCALC 138 (H) 08/13/2016 0833         Assessment and plan:   1. Cervical cancer screening  - Cytology - PAP Bennett  2. CMT and adnexal tenderness -STI screening  Has routine appointment in March.   The patient was given clear instructions to go to ER or return to medical center if symptoms don't improve, worsen or new problems develop. The patient verbalized understanding.    Henrietta Hoover FNP  10/14/2016, 9:57 AM

## 2016-10-16 LAB — CYTOLOGY - PAP
Chlamydia: NEGATIVE
Diagnosis: NEGATIVE
NEISSERIA GONORRHEA: NEGATIVE
Trichomonas: NEGATIVE

## 2016-10-29 ENCOUNTER — Ambulatory Visit (INDEPENDENT_AMBULATORY_CARE_PROVIDER_SITE_OTHER): Payer: Self-pay | Admitting: Orthopaedic Surgery

## 2016-11-04 ENCOUNTER — Encounter (HOSPITAL_COMMUNITY): Payer: Self-pay | Admitting: Emergency Medicine

## 2016-11-04 ENCOUNTER — Emergency Department (HOSPITAL_COMMUNITY)
Admission: EM | Admit: 2016-11-04 | Discharge: 2016-11-04 | Disposition: A | Discharge status: Home / Self Care | Payer: Self-pay | Attending: Emergency Medicine | Admitting: Emergency Medicine

## 2016-11-04 DIAGNOSIS — Z5321 Procedure and treatment not carried out due to patient leaving prior to being seen by health care provider: Secondary | ICD-10-CM | POA: Insufficient documentation

## 2016-11-04 DIAGNOSIS — F17210 Nicotine dependence, cigarettes, uncomplicated: Secondary | ICD-10-CM | POA: Insufficient documentation

## 2016-11-04 DIAGNOSIS — Z79899 Other long term (current) drug therapy: Secondary | ICD-10-CM | POA: Insufficient documentation

## 2016-11-04 DIAGNOSIS — R05 Cough: Secondary | ICD-10-CM | POA: Insufficient documentation

## 2016-11-04 DIAGNOSIS — E119 Type 2 diabetes mellitus without complications: Secondary | ICD-10-CM | POA: Insufficient documentation

## 2016-11-04 DIAGNOSIS — I10 Essential (primary) hypertension: Secondary | ICD-10-CM | POA: Insufficient documentation

## 2016-11-04 DIAGNOSIS — Z7984 Long term (current) use of oral hypoglycemic drugs: Secondary | ICD-10-CM | POA: Insufficient documentation

## 2016-11-04 HISTORY — DX: Nicotine dependence, cigarettes, uncomplicated: F17.210

## 2016-11-04 NOTE — ED Triage Notes (Signed)
Patient reports persistent productive cough for > 2 weeks with chest congestion unrelieved by OTC Nyquil , denies fever or chills .

## 2016-11-04 NOTE — ED Notes (Signed)
Pt states that she does not wish to stay, states there are many more people that are sicker then her and she will come back another time. Unable to talk patient into staying

## 2016-12-05 ENCOUNTER — Ambulatory Visit: Payer: Self-pay | Admitting: Family Medicine

## 2016-12-06 ENCOUNTER — Other Ambulatory Visit: Payer: Self-pay

## 2016-12-06 ENCOUNTER — Encounter: Payer: Self-pay | Admitting: Family Medicine

## 2016-12-06 ENCOUNTER — Ambulatory Visit (INDEPENDENT_AMBULATORY_CARE_PROVIDER_SITE_OTHER): Payer: Self-pay | Admitting: Family Medicine

## 2016-12-06 VITALS — BP 162/88 | HR 83 | Temp 98.4°F | Resp 16 | Ht 64.0 in | Wt 129.0 lb

## 2016-12-06 DIAGNOSIS — I10 Essential (primary) hypertension: Secondary | ICD-10-CM

## 2016-12-06 DIAGNOSIS — E785 Hyperlipidemia, unspecified: Secondary | ICD-10-CM

## 2016-12-06 DIAGNOSIS — R8299 Other abnormal findings in urine: Secondary | ICD-10-CM

## 2016-12-06 DIAGNOSIS — M5441 Lumbago with sciatica, right side: Secondary | ICD-10-CM

## 2016-12-06 DIAGNOSIS — R5383 Other fatigue: Secondary | ICD-10-CM

## 2016-12-06 DIAGNOSIS — M62830 Muscle spasm of back: Secondary | ICD-10-CM

## 2016-12-06 DIAGNOSIS — G8929 Other chronic pain: Secondary | ICD-10-CM

## 2016-12-06 DIAGNOSIS — R82998 Other abnormal findings in urine: Secondary | ICD-10-CM

## 2016-12-06 DIAGNOSIS — R634 Abnormal weight loss: Secondary | ICD-10-CM

## 2016-12-06 DIAGNOSIS — G629 Polyneuropathy, unspecified: Secondary | ICD-10-CM

## 2016-12-06 DIAGNOSIS — Z1159 Encounter for screening for other viral diseases: Secondary | ICD-10-CM

## 2016-12-06 DIAGNOSIS — M5442 Lumbago with sciatica, left side: Secondary | ICD-10-CM

## 2016-12-06 DIAGNOSIS — Z114 Encounter for screening for human immunodeficiency virus [HIV]: Secondary | ICD-10-CM

## 2016-12-06 DIAGNOSIS — Z23 Encounter for immunization: Secondary | ICD-10-CM

## 2016-12-06 DIAGNOSIS — E118 Type 2 diabetes mellitus with unspecified complications: Secondary | ICD-10-CM

## 2016-12-06 DIAGNOSIS — F17200 Nicotine dependence, unspecified, uncomplicated: Secondary | ICD-10-CM

## 2016-12-06 LAB — CBC WITH DIFFERENTIAL/PLATELET
BASOS ABS: 0 {cells}/uL (ref 0–200)
Basophils Relative: 0 %
EOS PCT: 1 %
Eosinophils Absolute: 55 cells/uL (ref 15–500)
HCT: 42.7 % (ref 35.0–45.0)
HEMOGLOBIN: 14.3 g/dL (ref 11.7–15.5)
LYMPHS ABS: 2200 {cells}/uL (ref 850–3900)
Lymphocytes Relative: 40 %
MCH: 30.4 pg (ref 27.0–33.0)
MCHC: 33.5 g/dL (ref 32.0–36.0)
MCV: 90.9 fL (ref 80.0–100.0)
MONO ABS: 440 {cells}/uL (ref 200–950)
MPV: 8.5 fL (ref 7.5–12.5)
Monocytes Relative: 8 %
NEUTROS ABS: 2805 {cells}/uL (ref 1500–7800)
Neutrophils Relative %: 51 %
Platelets: 222 10*3/uL (ref 140–400)
RBC: 4.7 MIL/uL (ref 3.80–5.10)
RDW: 15 % (ref 11.0–15.0)
WBC: 5.5 10*3/uL (ref 3.8–10.8)

## 2016-12-06 LAB — COMPLETE METABOLIC PANEL WITH GFR
ALBUMIN: 4.2 g/dL (ref 3.6–5.1)
ALK PHOS: 75 U/L (ref 33–115)
ALT: 33 U/L — ABNORMAL HIGH (ref 6–29)
AST: 29 U/L (ref 10–35)
BILIRUBIN TOTAL: 0.5 mg/dL (ref 0.2–1.2)
BUN: 7 mg/dL (ref 7–25)
CO2: 29 mmol/L (ref 20–31)
CREATININE: 0.51 mg/dL (ref 0.50–1.10)
Calcium: 9.7 mg/dL (ref 8.6–10.2)
Chloride: 105 mmol/L (ref 98–110)
GFR, Est African American: >89 mL/min (ref >=60)
GFR, Est Non African American: >89 mL/min (ref >=60)
GLUCOSE: 127 mg/dL — AB (ref 65–99)
Potassium: 4.2 mmol/L (ref 3.5–5.3)
SODIUM: 142 mmol/L (ref 135–146)
TOTAL PROTEIN: 7.3 g/dL (ref 6.1–8.1)

## 2016-12-06 LAB — POCT URINALYSIS DIP (DEVICE)
Glucose, UA: NEGATIVE mg/dL
Ketones, ur: NEGATIVE mg/dL
Nitrite: NEGATIVE
PROTEIN: 30 mg/dL — AB
SPECIFIC GRAVITY, URINE: 1.025 (ref 1.005–1.030)
UROBILINOGEN UA: 0.2 mg/dL (ref 0.0–1.0)
pH: 5.5 (ref 5.0–8.0)

## 2016-12-06 LAB — LIPID PANEL
CHOLESTEROL: 200 mg/dL — AB (ref <200)
HDL: 46 mg/dL — AB (ref >50)
LDL Cholesterol: 140 mg/dL — ABNORMAL HIGH (ref <100)
Total CHOL/HDL Ratio: 4.3 Ratio (ref <5.0)
Triglycerides: 70 mg/dL (ref <150)
VLDL: 14 mg/dL (ref <30)

## 2016-12-06 LAB — POCT GLYCOSYLATED HEMOGLOBIN (HGB A1C): Hemoglobin A1C: 6.4

## 2016-12-06 LAB — TSH: TSH: 1.21 m[IU]/L

## 2016-12-06 MED ORDER — ASPIRIN EC 81 MG PO TBEC: 81.0000 mg | DELAYED_RELEASE_TABLET | Freq: Every day | ORAL | 11 refills | Status: DC

## 2016-12-06 MED ORDER — GABAPENTIN 300 MG PO CAPS: 300.0000 mg | ORAL_CAPSULE | Freq: Three times a day (TID) | ORAL | 3 refills | Status: DC

## 2016-12-06 MED ORDER — LISINOPRIL 20 MG PO TABS: 20.0000 mg | ORAL_TABLET | Freq: Every day | ORAL | 1 refills | Status: DC

## 2016-12-06 MED ORDER — PRAVASTATIN SODIUM 20 MG PO TABS: 20.0000 mg | ORAL_TABLET | Freq: Every day | ORAL | 0 refills | Status: DC

## 2016-12-06 MED ORDER — METHOCARBAMOL 500 MG PO TABS: ORAL_TABLET | ORAL | 0 refills | Status: DC

## 2016-12-06 MED ORDER — METFORMIN HCL 500 MG PO TABS: 500.0000 mg | ORAL_TABLET | Freq: Two times a day (BID) | ORAL | 2 refills | Status: DC

## 2016-12-06 NOTE — Progress Notes (Signed)
Subjective:    Patient ID: Misty Mccullough, female    DOB: 03/04/1971, 46 y.o.   MRN: [id number]  Diabetes  She presents for her follow-up diabetic visit. She has type 2 diabetes mellitus. Her disease course has been stable. There are no hypoglycemic associated symptoms. Pertinent negatives for hypoglycemia include no dizziness, headaches, hunger, nervousness/anxiousness, pallor, seizures, sleepiness, speech difficulty, sweats or tremors. Associated symptoms include fatigue and foot paresthesias. Pertinent negatives for diabetes include no blurred vision, no chest pain, no polydipsia, no polyphagia, no polyuria and no weakness. Symptoms are improving. Risk factors for coronary artery disease include dyslipidemia and hypertension. She is following a high fat/cholesterol diet. She has not had a previous visit with a dietitian. There is no change in her home blood glucose trend. An ACE inhibitor/angiotensin II receptor blocker is being taken. She does not see a podiatrist.Eye exam is not current.  Hypertension  This is a chronic problem. The current episode started more than 1 year ago. The problem is controlled. Pertinent negatives include no anxiety, blurred vision, chest pain, headaches, malaise/fatigue, palpitations, peripheral edema, PND, shortness of breath or sweats. Risk factors for coronary artery disease include dyslipidemia. Past treatments include ACE inhibitors. Compliance problems include exercise.   Back Pain  This is a chronic (Currently in physical therapy. Back pain is improving) problem. The current episode started more than 1 year ago. The pain is present in the lumbar spine. The pain is at a severity of 6/10. The pain is moderate. The symptoms are aggravated by bending, position and standing. Associated symptoms include paresthesias. Pertinent negatives include no chest pain, fever, headaches, paresis, pelvic pain, perianal numbness, tingling or weakness. She has tried muscle relaxant,  walking and NSAIDs for the symptoms. The treatment provided moderate relief.     Past Medical History:  Diagnosis Date  . Diabetes mellitus without complication (HCC)   . Heavy cigarette smoker   . Hypercholesteremia   . Hypertension    Review of Systems  Constitutional: Positive for fatigue. Negative for fever and malaise/fatigue.  HENT: Negative.   Eyes: Negative.  Negative for blurred vision.  Respiratory: Negative.  Negative for shortness of breath.   Cardiovascular: Negative for chest pain, palpitations, leg swelling and PND.  Gastrointestinal: Negative.   Endocrine: Negative.  Negative for polydipsia, polyphagia and polyuria.  Genitourinary: Negative.  Negative for pelvic pain.  Musculoskeletal: Positive for back pain.  Skin: Negative.  Negative for pallor.  Allergic/Immunologic: Negative.   Neurological: Positive for paresthesias. Negative for dizziness, tingling, tremors, seizures, speech difficulty, weakness and headaches.  Hematological: Negative.   Psychiatric/Behavioral: Negative.  The patient is not nervous/anxious.        Objective:   Physical Exam  Constitutional: She is oriented to person, place, and time. She appears well-developed and well-nourished.  HENT:  Head: Normocephalic and atraumatic.  Right Ear: Hearing, tympanic membrane, external ear and ear canal normal.  Left Ear: Hearing, tympanic membrane, external ear and ear canal normal.  Nose: Nose normal.  Mouth/Throat: Uvula is midline and oropharynx is clear and moist. Abnormal dentition (partially edentulous).  Neck: Trachea normal.  Cardiovascular: Regular rhythm.   Pulmonary/Chest: Effort normal and breath sounds normal.  Abdominal: Normal appearance.  Musculoskeletal:       Lumbar back: She exhibits pain and spasm.  Neurological: She is oriented to person, place, and time. She has normal reflexes. She displays normal reflexes. No cranial nerve deficit. She exhibits normal muscle tone.  Coordination normal.  Skin: Skin is warm,  dry and intact.  Psychiatric: She has a normal mood and affect. Her behavior is normal. Judgment and thought content normal.      BP (!) 162/88 (BP Location: Left Arm, Patient Position: Sitting, Cuff Size: Normal)   Pulse 83   Temp 98.4 F (36.9 C) (Oral)   Resp 16   Ht 5\' 4"  (1.626 m)   Wt 129 lb (58.5 kg)   LMP 12/04/2016   SpO2 99%   BMI 22.14 kg/m  Assessment & Plan:  1. Diabetes mellitus with complication (HCC) Recommend a lowfat, low carbohydrate diet divided over 5-6 small meals, increase water intake to 6-8 glasses, and 150 minutes per week of cardiovascular exercise.   - HgB A1c - lisinopril (PRINIVIL,ZESTRIL) 20 MG tablet; Take 1 tablet (20 mg total) by mouth daily.  Dispense: 90 tablet; Refill: 1 - metFORMIN (GLUCOPHAGE) 500 MG tablet; Take 1 tablet (500 mg total) by mouth 2 (two) times daily with a meal.  Dispense: 60 tablet; Refill: 2  2. Essential hypertension - lisinopril (PRINIVIL,ZESTRIL) 20 MG tablet; Take 1 tablet (20 mg total) by mouth daily.  Dispense: 90 tablet; Refill: 1  3. Hyperlipidemia, unspecified hyperlipidemia type - Lipid Panel - aspirin EC 81 MG tablet; Take 1 tablet (81 mg total) by mouth daily.  Dispense: 30 tablet; Refill: 11  4. Immunization due - Pneumococcal polysaccharide vaccine 23-valent greater than or equal to 2yo subcutaneous/IM  5. Need for Tdap vaccination - Tdap vaccine greater than or equal to 7yo IM  6. Chronic bilateral low back pain with bilateral sciatica - methocarbamol (ROBAXIN) 500 MG tablet; Can take up to 1 tabs every 6 hours PRN PAIN  Dispense: 30 tablet; Refill: 0  7. Back spasm - methocarbamol (ROBAXIN) 500 MG tablet; Can take up to 1 tabs every 6 hours PRN PAIN  Dispense: 30 tablet; Refill: 0  8. Tobacco dependence Smoking cessation instruction/counseling given:  counseled patient on the dangers of tobacco use, advised patient to stop smoking, and reviewed strategies to  maximize success  9. Neuropathy (HCC) - gabapentin (NEURONTIN) 300 MG capsule; Take 1 capsule (300 mg total) by mouth 3 (three) times daily.  Dispense: 90 capsule; Refill: 3  10. Weight loss - TSH - CBC with Differential - COMPLETE METABOLIC PANEL WITH GFR  11. Fatigue, unspecified type - CBC with Differential  12. Screening for HIV (human immunodeficiency virus)  - HIV antibody (with reflex)  13. Need for hepatitis C screening test - Hepatitis C antibody, reflex  14. Leukocytes in urine - Urine culture   RTC: 3 months for chronic conditions   Graig Hessling M, FNP

## 2016-12-06 NOTE — Telephone Encounter (Signed)
Refill for cholesterol medication has been sent into pharmacy. Thanks!

## 2016-12-07 LAB — HEPATITIS C ANTIBODY: HCV Ab: NEGATIVE

## 2016-12-07 LAB — HIV ANTIBODY (ROUTINE TESTING W REFLEX): HIV 1&2 Ab, 4th Generation: NONREACTIVE

## 2016-12-08 LAB — URINE CULTURE

## 2016-12-10 ENCOUNTER — Other Ambulatory Visit: Payer: Self-pay | Admitting: Family Medicine

## 2016-12-10 DIAGNOSIS — N390 Urinary tract infection, site not specified: Secondary | ICD-10-CM

## 2016-12-10 MED ORDER — AMOXICILLIN 500 MG PO TABS: 500.0000 mg | ORAL_TABLET | Freq: Two times a day (BID) | ORAL | 0 refills | Status: AC

## 2016-12-10 NOTE — Progress Notes (Signed)
Meds ordered this encounter  Medications  . amoxicillin (AMOXIL) 500 MG tablet    Sig: Take 1 tablet (500 mg total) by mouth 2 (two) times daily.    Dispense:  20 tablet    Refill:  0  Reviewed labs, patient has a urinary tract infection. Will start Amoxicillin 500 mg twice daily for 10 days. Pt to  increase water intake, wipe from front to back, and practice good perianal hygiene. Will follow up in office as previously scheduled.   Massie MaroonHollis,Medea Deines M, FNP

## 2016-12-10 NOTE — Progress Notes (Signed)
Called and left message for patient to call back.

## 2016-12-11 ENCOUNTER — Other Ambulatory Visit: Payer: Self-pay

## 2016-12-11 DIAGNOSIS — M5442 Lumbago with sciatica, left side: Principal | ICD-10-CM

## 2016-12-11 DIAGNOSIS — M5441 Lumbago with sciatica, right side: Principal | ICD-10-CM

## 2016-12-11 DIAGNOSIS — M62830 Muscle spasm of back: Secondary | ICD-10-CM

## 2016-12-11 DIAGNOSIS — G8929 Other chronic pain: Secondary | ICD-10-CM

## 2016-12-11 MED ORDER — METHOCARBAMOL 500 MG PO TABS: ORAL_TABLET | ORAL | 0 refills | Status: DC

## 2016-12-11 NOTE — Progress Notes (Signed)
Called patient and advised of urinary tract infection. Advised to take amoxicillin 500mg  as prescribed daily for 10 days. Encouraged patient to increase water intake and to practice good hygiene, wiping from front to back.

## 2016-12-11 NOTE — Telephone Encounter (Signed)
Patient states [facility] did not receive rx for metocarbamol. I have re-sent this to pharmacy. Thanks!

## 2017-02-04 ENCOUNTER — Encounter: Payer: Self-pay | Admitting: Family Medicine

## 2017-02-04 ENCOUNTER — Telehealth: Payer: Self-pay

## 2017-02-04 ENCOUNTER — Ambulatory Visit (INDEPENDENT_AMBULATORY_CARE_PROVIDER_SITE_OTHER): Payer: Self-pay | Admitting: Family Medicine

## 2017-02-04 VITALS — BP 145/82 | HR 75 | Temp 97.7°F | Resp 16 | Ht 64.0 in | Wt 135.0 lb

## 2017-02-04 DIAGNOSIS — E118 Type 2 diabetes mellitus with unspecified complications: Secondary | ICD-10-CM

## 2017-02-04 DIAGNOSIS — I10 Essential (primary) hypertension: Secondary | ICD-10-CM

## 2017-02-04 DIAGNOSIS — R1084 Generalized abdominal pain: Secondary | ICD-10-CM

## 2017-02-04 LAB — CBC WITH DIFFERENTIAL/PLATELET
BASOS ABS: 41 {cells}/uL (ref 0–200)
Basophils Relative: 1 %
EOS ABS: 82 {cells}/uL (ref 15–500)
Eosinophils Relative: 2 %
HEMATOCRIT: 36.5 % (ref 35.0–45.0)
Hemoglobin: 12.3 g/dL (ref 11.7–15.5)
LYMPHS PCT: 40 %
Lymphs Abs: 1640 cells/uL (ref 850–3900)
MCH: 30.7 pg (ref 27.0–33.0)
MCHC: 33.7 g/dL (ref 32.0–36.0)
MCV: 91 fL (ref 80.0–100.0)
MONO ABS: 287 {cells}/uL (ref 200–950)
MONOS PCT: 7 %
MPV: 8.3 fL (ref 7.5–12.5)
NEUTROS ABS: 2050 {cells}/uL (ref 1500–7800)
NEUTROS PCT: 50 %
Platelets: 176 10*3/uL (ref 140–400)
RBC: 4.01 MIL/uL (ref 3.80–5.10)
RDW: 14.6 % (ref 11.0–15.0)
WBC: 4.1 10*3/uL (ref 3.8–10.8)

## 2017-02-04 LAB — POCT URINALYSIS DIP (DEVICE)
BILIRUBIN URINE: NEGATIVE
Glucose, UA: NEGATIVE mg/dL
HGB URINE DIPSTICK: NEGATIVE
Ketones, ur: NEGATIVE mg/dL
Leukocytes, UA: NEGATIVE
NITRITE: NEGATIVE
PH: 6.5 (ref 5.0–8.0)
PROTEIN: NEGATIVE mg/dL
Specific Gravity, Urine: 1.015 (ref 1.005–1.030)
Urobilinogen, UA: 0.2 mg/dL (ref 0.0–1.0)

## 2017-02-04 MED ORDER — DICYCLOMINE HCL 10 MG PO CAPS: 10.0000 mg | ORAL_CAPSULE | Freq: Three times a day (TID) | ORAL | 0 refills | Status: DC

## 2017-02-04 MED ORDER — OMEPRAZOLE 20 MG PO CPDR: 20.0000 mg | DELAYED_RELEASE_CAPSULE | Freq: Two times a day (BID) | ORAL | 1 refills | Status: DC

## 2017-02-04 MED ORDER — LISINOPRIL 20 MG PO TABS: 40.0000 mg | ORAL_TABLET | Freq: Every day | ORAL | 1 refills | Status: DC

## 2017-02-04 NOTE — Telephone Encounter (Signed)
-----   Message from Bing Neighbors, FNP sent at 02/04/2017 10:21 AM EDT ----- Please schedule an complete abdominal ultrasound for patient as soon as possible here at [facility]

## 2017-02-04 NOTE — Progress Notes (Signed)
Patient ID: Misty Mccullough, female    DOB: 10-10-1970, 46 y.o.   MRN: [id number]  PCP: Joaquin Courts, FNP  Chief Complaint  Patient presents with  . Abdominal Pain    3 MONTHS    Subjective:  HPI  Misty Mccullough is a 46 y.o. female presents for evaluation of generalized abdominal pain x 3 months. Current problems include Hypertension, T2DM, mild nutrition, and hyperlipidemia.  [name] reports experiencing daily episodes of abdominal pain which consists of burning, stinging, and aching pain. She was previously prescribed omeprazole for acid reflux and stopped taking medication as her symptoms did not improve. Uncertain of length of time she took medication but didn't obtain desired abdominal relief. Reports nausea without vomiting.  Burning is so intense at times, touch her abdomen worsens the burning. Reports abnormal bowel patterns such as several days of constipation and diarrhea. Vice versa. Burning and aching of abdomin is constantly there and is never relieved. Reports appetite is "so-so"-mostly snacks throughout the day. No bloody or tarry stool.  Initial blood pressure reading today 184/77. [name] reports that she monitors readings at home and highest readings she obtains are 140's / 80's. Denies any recent associated headaches, dizziness, chest pain, blurring of vision.  Social History   Social History  . Marital status: Married    Spouse name: N/A  . Number of children: N/A  . Years of education: N/A   Occupational History  . Not on file.   Social History Main Topics  . Smoking status: Current Every Day Smoker    Packs/day: 1.00  . Smokeless tobacco: Never Used  . Alcohol use No  . Drug use: No  . Sexual activity: Not on file   Other Topics Concern  . Not on file   Social History Narrative  . No narrative on file    Family History  Problem Relation Age of Onset  . Diabetes Mother   . Uterine cancer Mother   . Diabetes Father   . Heart attack Father 21  .  Stroke Father   . Heart attack Paternal Grandmother   . Heart attack Paternal Grandfather    Review of Systems See HPI  Patient Active Problem List   Diagnosis Date Noted  . Malnutrition of moderate degree 08/14/2016  . Chest pain 08/13/2016  . Back pain 08/13/2016  . Essential hypertension 08/13/2016  . HLD (hyperlipidemia) 08/13/2016  . Diabetes mellitus with complication (HCC) 08/13/2016  . Chest pain, exertional     No Known Allergies  Prior to Admission medications   Medication Sig Start Date End Date Taking? Authorizing Provider  aspirin EC 81 MG tablet Take 1 tablet (81 mg total) by mouth daily. 12/06/16  Yes Massie Maroon, FNP  feeding supplement, ENSURE ENLIVE, (ENSURE ENLIVE) LIQD Take 237 mLs by mouth 2 (two) times daily between meals. 08/14/16  Yes Belkys A Regalado, MD  fluticasone (FLONASE) 50 MCG/ACT nasal spray Place 2 sprays into both nostrils daily. 09/06/16  Yes Henrietta Hoover, NP  gabapentin (NEURONTIN) 300 MG capsule Take 1 capsule (300 mg total) by mouth 3 (three) times daily. 12/06/16  Yes Massie Maroon, FNP  lisinopril (PRINIVIL,ZESTRIL) 20 MG tablet Take 1 tablet (20 mg total) by mouth daily. 12/06/16  Yes Massie Maroon, FNP  metFORMIN (GLUCOPHAGE) 500 MG tablet Take 1 tablet (500 mg total) by mouth 2 (two) times daily with a meal. 12/06/16  Yes Massie Maroon, FNP  Multiple Vitamin (MULTIVITAMIN WITH MINERALS) TABS tablet Take 1  tablet by mouth daily.   Yes Historical Provider, MD  diclofenac sodium (VOLTAREN) 1 % GEL Apply 4 g topically 4 (four) times daily. Patient not taking: Reported on 02/04/2017 10/07/16   Joni Reining Pisciotta, PA-C  methocarbamol (ROBAXIN) 500 MG tablet Can take up to 1 tabs every 6 hours PRN PAIN Patient not taking: Reported on 02/04/2017 12/11/16   Massie Maroon, FNP  omeprazole (PRILOSEC) 40 MG capsule Take 1 capsule (40 mg total) by mouth daily. Patient not taking: Reported on 02/04/2017 10/14/16   Henrietta Hoover, NP  pravastatin  (PRAVACHOL) 20 MG tablet Take 1 tablet (20 mg total) by mouth daily at 6 PM. Patient not taking: Reported on 02/04/2017 12/06/16   Massie Maroon, FNP    Past Medical, Surgical Family and Social History reviewed and updated.    Objective:   Today's Vitals   02/04/17 0950  BP: (!) 184/77  Pulse: 75  Resp: 16  Temp: 97.7 F (36.5 C)  TempSrc: Oral  SpO2: 100%  Weight: 135 lb (61.2 kg)  Height:  (1.626 m)    Wt Readings from Last 3 Encounters:  02/04/17 135 lb (61.2 kg)  12/06/16 129 lb (58.5 kg)  11/04/16 131 lb (59.4 kg)   Physical Exam  Constitutional: She is oriented to person, place, and time. She appears well-developed and well-nourished.  HENT:  Head: Normocephalic and atraumatic.  Eyes: Conjunctivae and EOM are normal. Pupils are equal, round, and reactive to light.  Neck: Normal range of motion. Neck supple.  Cardiovascular: Normal rate, regular rhythm, normal heart sounds and intact distal pulses.   Pulmonary/Chest: Effort normal and breath sounds normal.  Abdominal: Soft. Bowel sounds are normal. She exhibits no distension and no mass. There is no hepatosplenomegaly, splenomegaly or hepatomegaly. There is tenderness in the right upper quadrant, right lower quadrant, epigastric area, periumbilical area, left upper quadrant and left lower quadrant. There is no rebound, no guarding and no CVA tenderness. Hernia confirmed negative in the right inguinal area and confirmed negative in the left inguinal area.  Musculoskeletal: Normal range of motion.  Lymphadenopathy:    She has no cervical adenopathy.  Neurological: She is alert and oriented to person, place, and time.  Skin: Skin is warm and dry.  Psychiatric: She has a normal mood and affect. Her behavior is normal. Judgment and thought content normal.     Assessment & Plan:  1. Generalized abdominal pain, atypical abdominal pain and symptoms of burning. Given  Symptoms which could likely be associated with GERD  and IBS, will trial medications indicated for both conditions. As this abdominal pain has persisted for an average of 3 months, will obtain an abdominal ultrasound to rule out any underlying gallbladder or pancreatic involvement. Checking a CBC, lipase and amylase today. - omeprazole (PRILOSEC) 20 MG capsule; Take 1 capsule (20 mg total) by mouth 2 (two) times daily with a meal.   - dicyclomine (BENTYL) 10 MG capsule; Take 1 capsule (10 mg total) by mouth 3 (three) times daily before meals.   2. Diabetes mellitus with complication (HCC), controlled A1C 6.7 (2 months ago).  -Continue current treatment  3. Essential hypertension - lisinopril (PRINIVIL,ZESTRIL) 20 MG tablet; Take 2 tablets (40 mg total) by mouth daily.  Dispense: 90 tablet  RTC: Pending US abdomen. Diabetes follow-up 1 month to recheck A1C   Godfrey Pick. Tiburcio Pea, MSN, [facility] [address] [place], [place] [address] [phone]

## 2017-02-04 NOTE — Telephone Encounter (Signed)
Patient is schedule for 02/06/2017 at 8.00am.

## 2017-02-04 NOTE — Patient Instructions (Addendum)
For Irritable Bowel Syndrome  -Start Bentyl 3 times daily with meals for abdominal pain.  -You will be contacted regarding scheduling your abdominal ultrasound.  For abdominal burning likely related to acid reflux  -Start Omeprazole 20 mg twice daily.  Continue to take medications as it may take 1-2 weeks before improvement may be seen.     Gastroesophageal Reflux Disease, Adult Normally, food travels down the esophagus and stays in the stomach to be digested. If a person has gastroesophageal reflux disease (GERD), food and stomach acid move back up into the esophagus. When this happens, the esophagus becomes sore and swollen (inflamed). Over time, GERD can make small holes (ulcers) in the lining of the esophagus. Follow these instructions at home: Diet   Follow a diet as told by your doctor. You may need to avoid foods and drinks such as:  Coffee and tea (with or without caffeine).  Drinks that contain alcohol.  Energy drinks and sports drinks.  Carbonated drinks or sodas.  Chocolate and cocoa.  Peppermint and mint flavorings.  Garlic and onions.  Horseradish.  Spicy and acidic foods, such as peppers, chili powder, curry powder, vinegar, hot sauces, and BBQ sauce.  Citrus fruit juices and citrus fruits, such as oranges, lemons, and limes.  Tomato-based foods, such as red sauce, chili, salsa, and pizza with red sauce.  Fried and fatty foods, such as donuts, french fries, potato chips, and high-fat dressings.  High-fat meats, such as hot dogs, rib eye steak, sausage, ham, and bacon.  High-fat dairy items, such as whole milk, butter, and cream cheese.  Eat small meals often. Avoid eating large meals.  Avoid drinking large amounts of liquid with your meals.  Avoid eating meals during the 2-3 hours before bedtime.  Avoid lying down right after you eat.  Do not exercise right after you eat. General instructions   Pay attention to any changes in your  symptoms.  Take over-the-counter and prescription medicines only as told by your doctor. Do not take aspirin, ibuprofen, or other NSAIDs unless your doctor says it is okay.  Do not use any tobacco products, including cigarettes, chewing tobacco, and e-cigarettes. If you need help quitting, ask your doctor.  Wear loose clothes. Do not wear anything tight around your waist.  Raise (elevate) the head of your bed about 6 inches (15 cm).  Try to lower your stress. If you need help doing this, ask your doctor.  If you are overweight, lose an amount of weight that is healthy for you. Ask your doctor about a safe weight loss goal.  Keep all follow-up visits as told by your doctor. This is important. Contact a doctor if:  You have new symptoms.  You lose weight and you do not know why it is happening.  You have trouble swallowing, or it hurts to swallow.  You have wheezing or a cough that keeps happening.  Your symptoms do not get better with treatment.  You have a hoarse voice. Get help right away if:  You have pain in your arms, neck, jaw, teeth, or back.  You feel sweaty, dizzy, or light-headed.  You have chest pain or shortness of breath.  You throw up (vomit) and your throw up looks like blood or coffee grounds.  You pass out (faint).  Your poop (stool) is bloody or black.  You cannot swallow, drink, or eat. This information is not intended to replace advice given to you by your health care provider. Make sure you discuss  any questions you have with your health care provider. Document Released: 03/11/2008 Document Revised: 02/29/2016 Document Reviewed: 01/18/2015 Elsevier Interactive Patient Education  2017 [employer] Inc[employer]         Irritable Bowel Syndrome, Adult Irritable bowel syndrome (IBS) is not one specific disease. It is a group of symptoms that affects the organs responsible for digestion (gastrointestinal or GI tract). To regulate how your GI tract works,  your body sends signals back and forth between your intestines and your brain. If you have IBS, there may be a problem with these signals. As a result, your GI tract does not function normally. Your intestines may become more sensitive and overreact to certain things. This is especially true when you eat certain foods or when you are under stress. There are four types of IBS. These may be determined based on the consistency of your stool:  IBS with diarrhea.  IBS with constipation.  Mixed IBS.  Unsubtyped IBS. It is important to know which type of IBS you have. Some treatments are more likely to be helpful for certain types of IBS. What are the causes? The exact cause of IBS is not known. What increases the risk? You may have a higher risk of IBS if:  You are a woman.  You are younger than 47 years old.  You have a family history of IBS.  You have mental health problems.  You have had bacterial infection of your GI tract. What are the signs or symptoms? Symptoms of IBS vary from person to person. The main symptom is abdominal pain or discomfort. Additional symptoms usually include one or more of the following:  Diarrhea, constipation, or both.  Abdominal swelling or bloating.  Feeling full or sick after eating a small or regular-size meal.  Frequent gas.  Mucus in the stool.  A feeling of having more stool left after a bowel movement. Symptoms tend to come and go. They may be associated with stress, psychiatric conditions, or nothing at all. How is this diagnosed? There is no specific test to diagnose IBS. Your health care provider will make a diagnosis based on a physical exam, medical history, and your symptoms. You may have other tests to rule out other conditions that may be causing your symptoms. These may include:  Blood tests.  X-rays.  CT scan.  Endoscopy and colonoscopy. This is a test in which your GI tract is viewed with a long, thin, flexible tube. How is  this treated? There is no cure for IBS, but treatment can help relieve symptoms. IBS treatment often includes:  Changes to your diet, such as:  Eating more fiber.  Avoiding foods that cause symptoms.  Drinking more water.  Eating regular, medium-sized portioned meals.  Medicines. These may include:  Fiber supplements if you have constipation.  Medicine to control diarrhea (antidiarrheal medicines).  Medicine to help control muscle spasms in your GI tract (antispasmodic medicines).  Medicines to help with any mental health issues, such as antidepressants or tranquilizers.  Therapy.  Talk therapy may help with anxiety, depression, or other mental health issues that can make IBS symptoms worse.  Stress reduction.  Managing your stress can help keep symptoms under control. Follow these instructions at home:  Take medicines only as directed by your health care provider.  Eat a healthy diet.  Avoid foods and drinks with added sugar.  Include more whole grains, fruits, and vegetables gradually into your diet. This may be especially helpful if you have IBS with constipation.  Avoid any foods and drinks that make your symptoms worse. These may include dairy products and caffeinated or carbonated drinks.  Do not eat large meals.  Drink enough fluid to keep your urine clear or pale yellow.  Exercise regularly. Ask your health care provider for recommendations of good activities for you.  Keep all follow-up visits as directed by your health care provider. This is important. Contact a health care provider if:  You have constant pain.  You have trouble or pain with swallowing.  You have worsening diarrhea. Get help right away if:  You have severe and worsening abdominal pain.  You have diarrhea and:  You have a rash, stiff neck, or severe headache.  You are irritable, sleepy, or difficult to awaken.  You are weak, dizzy, or extremely thirsty.  You have bright red  blood in your stool or you have black tarry stools.  You have unusual abdominal swelling that is painful.  You vomit continuously.  You vomit blood (hematemesis).  You have both abdominal pain and a fever. This information is not intended to replace advice given to you by your health care provider. Make sure you discuss any questions you have with your health care provider. Document Released: 09/23/2005 Document Revised: 02/23/2016 Document Reviewed: 06/10/2014 Elsevier Interactive Patient Education  2017 [employer].

## 2017-02-05 LAB — LIPASE: Lipase: 6 U/L — ABNORMAL LOW (ref 7–60)

## 2017-02-05 LAB — AMYLASE: Amylase: 47 U/L (ref 0–105)

## 2017-02-06 ENCOUNTER — Ambulatory Visit (HOSPITAL_COMMUNITY)
Admission: RE | Admit: 2017-02-06 | Discharge: 2017-02-06 | Disposition: A | Discharge status: Home / Self Care | Payer: Self-pay | Source: Ambulatory Visit | Attending: Family Medicine | Admitting: Family Medicine

## 2017-02-06 ENCOUNTER — Telehealth: Payer: Self-pay | Admitting: Family Medicine

## 2017-02-06 DIAGNOSIS — K8020 Calculus of gallbladder without cholecystitis without obstruction: Secondary | ICD-10-CM | POA: Insufficient documentation

## 2017-02-06 DIAGNOSIS — R1084 Generalized abdominal pain: Secondary | ICD-10-CM

## 2017-02-06 NOTE — Telephone Encounter (Signed)
Spoke with Ms. Besaw and advised of the following: Cholelithiasis were noted on the abdominal ultrasound in the absent of any bowel obstruction. The wall of her gallbladder was enlarged in the absence of any other abnormalities. Treatment prescribed in office was improving abdominal pain to some degree although she continues to have symptoms. She is in the process [facility] and once approved I will refer to GI for further workup. 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 Will repeat imaging in 6-12 months to monitor for further expansion.  Godfrey PickKimberly S. Tiburcio PeaHarris, MSN, [facility] [address] , [place] [id number] [phone]

## 2017-03-01 ENCOUNTER — Encounter (HOSPITAL_COMMUNITY): Payer: Self-pay

## 2017-03-01 DIAGNOSIS — Z7982 Long term (current) use of aspirin: Secondary | ICD-10-CM | POA: Insufficient documentation

## 2017-03-01 DIAGNOSIS — K8020 Calculus of gallbladder without cholecystitis without obstruction: Secondary | ICD-10-CM | POA: Insufficient documentation

## 2017-03-01 DIAGNOSIS — F17210 Nicotine dependence, cigarettes, uncomplicated: Secondary | ICD-10-CM | POA: Insufficient documentation

## 2017-03-01 DIAGNOSIS — E119 Type 2 diabetes mellitus without complications: Secondary | ICD-10-CM | POA: Insufficient documentation

## 2017-03-01 DIAGNOSIS — Z7984 Long term (current) use of oral hypoglycemic drugs: Secondary | ICD-10-CM | POA: Insufficient documentation

## 2017-03-01 DIAGNOSIS — I10 Essential (primary) hypertension: Secondary | ICD-10-CM | POA: Insufficient documentation

## 2017-03-01 DIAGNOSIS — Z79899 Other long term (current) drug therapy: Secondary | ICD-10-CM | POA: Insufficient documentation

## 2017-03-01 LAB — LIPASE, BLOOD: LIPASE: 22 U/L (ref 11–51)

## 2017-03-01 LAB — COMPREHENSIVE METABOLIC PANEL
ALBUMIN: 3.6 g/dL (ref 3.5–5.0)
ALT: 16 U/L (ref 14–54)
AST: 20 U/L (ref 15–41)
Alkaline Phosphatase: 76 U/L (ref 38–126)
Anion gap: 8 (ref 5–15)
BUN: <5 mg/dL — ABNORMAL LOW (ref 6–20)
CHLORIDE: 105 mmol/L (ref 101–111)
CO2: 25 mmol/L (ref 22–32)
Calcium: 9 mg/dL (ref 8.9–10.3)
Creatinine, Ser: 0.59 mg/dL (ref 0.44–1.00)
GFR calc Af Amer: >60 mL/min (ref >60)
GFR calc non Af Amer: >60 mL/min (ref >60)
GLUCOSE: 146 mg/dL — AB (ref 65–99)
POTASSIUM: 3.8 mmol/L (ref 3.5–5.1)
Sodium: 138 mmol/L (ref 135–145)
Total Bilirubin: 0.4 mg/dL (ref 0.3–1.2)
Total Protein: 6.6 g/dL (ref 6.5–8.1)

## 2017-03-01 LAB — CBC
HEMATOCRIT: 36.9 % (ref 36.0–46.0)
Hemoglobin: 12.3 g/dL (ref 12.0–15.0)
MCH: 30.3 pg (ref 26.0–34.0)
MCHC: 33.3 g/dL (ref 30.0–36.0)
MCV: 90.9 fL (ref 78.0–100.0)
Platelets: 172 10*3/uL (ref 150–400)
RBC: 4.06 MIL/uL (ref 3.87–5.11)
RDW: 13.8 % (ref 11.5–15.5)
WBC: 4.5 10*3/uL (ref 4.0–10.5)

## 2017-03-01 LAB — URINALYSIS, ROUTINE W REFLEX MICROSCOPIC
BILIRUBIN URINE: NEGATIVE
GLUCOSE, UA: NEGATIVE mg/dL
Hgb urine dipstick: NEGATIVE
KETONES UR: NEGATIVE mg/dL
Leukocytes, UA: NEGATIVE
Nitrite: NEGATIVE
PH: 6 (ref 5.0–8.0)
Protein, ur: NEGATIVE mg/dL
Specific Gravity, Urine: 1.002 — ABNORMAL LOW (ref 1.005–1.030)

## 2017-03-01 NOTE — ED Triage Notes (Signed)
Pt states that she has been having upper abd pain that is burning that radiates to her back along with nausea. Pt diagnosed with gallstones several weeks ago. Last night began having fevers, and today also started feeling like she was going to pass out.

## 2017-03-02 ENCOUNTER — Emergency Department (HOSPITAL_COMMUNITY): Payer: Self-pay

## 2017-03-02 ENCOUNTER — Emergency Department (HOSPITAL_COMMUNITY)
Admission: EM | Admit: 2017-03-02 | Discharge: 2017-03-02 | Disposition: A | Discharge status: Home / Self Care | Payer: Self-pay | Attending: Emergency Medicine | Admitting: Emergency Medicine

## 2017-03-02 DIAGNOSIS — R1084 Generalized abdominal pain: Secondary | ICD-10-CM

## 2017-03-02 DIAGNOSIS — K8020 Calculus of gallbladder without cholecystitis without obstruction: Secondary | ICD-10-CM

## 2017-03-02 HISTORY — DX: Calculus of gallbladder without cholecystitis without obstruction: K80.20

## 2017-03-02 LAB — I-STAT BETA HCG BLOOD, ED (MC, WL, AP ONLY)

## 2017-03-02 MED ORDER — IOPAMIDOL (ISOVUE-300) INJECTION 61%
INTRAVENOUS | Status: AC
  Administered 2017-03-02: 100 mL
  Filled 2017-03-02: qty 100

## 2017-03-02 NOTE — ED Notes (Signed)
To CT

## 2017-03-02 NOTE — ED Notes (Signed)
Pt st's was dx with gallstones approx 2 weeks ago.  Today c/o mid abd  Pain with nausea.

## 2017-03-02 NOTE — ED Provider Notes (Signed)
[facility]-EMERGENCY DEPT Provider Note   CSN: [id number] Arrival date & time: 03/01/17  2143     History   Chief Complaint Chief Complaint  Patient presents with  . Abdominal Pain    HPI Misty Mccullough is a 46 y.o. female.  Patient presents with complaint of abdominal pain starting today. It is pain she has had in the past when diagnosed with gall stones. Patient has nausea without vomiting. She was having subjective fever last night when symptoms started, none today. She reports the pain has no alleviating or aggravating factors, including eating, drinking, lying sitting up. She has not taken anything at home to relieve symptoms. She describes the pain as burning and radiates into her back.    The history is provided by the patient. No language interpreter was used.    Past Medical History:  Diagnosis Date  . Diabetes mellitus without complication (HCC)   . Gallstones   . Heavy cigarette smoker   . Hypercholesteremia   . Hypertension     Patient Active Problem List   Diagnosis Date Noted  . Malnutrition of moderate degree 08/14/2016  . Chest pain 08/13/2016  . Back pain 08/13/2016  . Essential hypertension 08/13/2016  . HLD (hyperlipidemia) 08/13/2016  . Diabetes mellitus with complication (HCC) 08/13/2016  . Chest pain, exertional     Past Surgical History:  Procedure Laterality Date  . TUBAL LIGATION      OB History    No data available       Home Medications    Prior to Admission medications   Medication Sig Start Date End Date Taking? Authorizing Provider  aspirin EC 81 MG tablet Take 1 tablet (81 mg total) by mouth daily. 12/06/16   Massie Maroon, FNP  diclofenac sodium (VOLTAREN) 1 % GEL Apply 4 g topically 4 (four) times daily. Patient not taking: Reported on 02/04/2017 10/07/16   Pisciotta, Joni Reining, PA-C  dicyclomine (BENTYL) 10 MG capsule Take 1 capsule (10 mg total) by mouth 3 (three) times daily before meals. 02/04/17   Bing Neighbors, FNP  feeding  supplement, ENSURE ENLIVE, (ENSURE ENLIVE) LIQD Take 237 mLs by mouth 2 (two) times daily between meals. 08/14/16   Regalado, Belkys A, MD  fluticasone (FLONASE) 50 MCG/ACT nasal spray Place 2 sprays into both nostrils daily. 09/06/16   Henrietta Hoover, NP  gabapentin (NEURONTIN) 300 MG capsule Take 1 capsule (300 mg total) by mouth 3 (three) times daily. 12/06/16   Massie Maroon, FNP  lisinopril (PRINIVIL,ZESTRIL) 20 MG tablet Take 2 tablets (40 mg total) by mouth daily. 02/04/17   Bing Neighbors, FNP  metFORMIN (GLUCOPHAGE) 500 MG tablet Take 1 tablet (500 mg total) by mouth 2 (two) times daily with a meal. 12/06/16   Massie Maroon, FNP  methocarbamol (ROBAXIN) 500 MG tablet Can take up to 1 tabs every 6 hours PRN PAIN Patient not taking: Reported on 02/04/2017 12/11/16   Massie Maroon, FNP  Multiple Vitamin (MULTIVITAMIN WITH MINERALS) TABS tablet Take 1 tablet by mouth daily.    [provider]  omeprazole (PRILOSEC) 20 MG capsule Take 1 capsule (20 mg total) by mouth 2 (two) times daily with a meal. 02/04/17   Bing Neighbors, FNP  pravastatin (PRAVACHOL) 20 MG tablet Take 1 tablet (20 mg total) by mouth daily at 6 PM. Patient not taking: Reported on 02/04/2017 12/06/16   Massie Maroon, FNP    Family History Family History  Problem Relation Age of Onset  .  Diabetes Mother   . Uterine cancer Mother   . Diabetes Father   . Heart attack Father 462  . Stroke Father   . Heart attack Paternal Grandmother   . Heart attack Paternal Grandfather     Social History Social History  Substance Use Topics  . Smoking status: Current Every Day Smoker    Packs/day: 1.00  . Smokeless tobacco: Never Used  . Alcohol use No     Allergies   Patient has no known allergies.   Review of Systems Review of Systems  Constitutional: Negative for chills and fever.  Respiratory: Negative.  Negative for shortness of breath.   Cardiovascular: Negative.  Negative for chest pain.    Gastrointestinal: Positive for abdominal pain and nausea. Negative for vomiting.  Musculoskeletal: Positive for back pain (See HPI.).  Skin: Negative.   Neurological: Negative.      Physical Exam Updated Vital Signs BP (!) 161/82   Pulse 74   Temp 97.5 F (36.4 C) (Oral)   Resp 16   SpO2 97%   Physical Exam  Constitutional: She is oriented to person, place, and time. She appears well-developed and well-nourished.  HENT:  Head: Normocephalic.  Neck: Normal range of motion. Neck supple.  Cardiovascular: Normal rate and regular rhythm.   Pulmonary/Chest: Effort normal and breath sounds normal. She has no wheezes. She has no rales. She exhibits no tenderness.  Abdominal: Soft. Bowel sounds are normal. She exhibits no distension. There is tenderness (Tenderness is diffuse and not isolated to RUQ.). There is no rebound and no guarding.  Musculoskeletal: Normal range of motion.  Neurological: She is alert and oriented to person, place, and time.  Skin: Skin is warm and dry. No rash noted.  Psychiatric: She has a normal mood and affect.     ED Treatments / Results  Labs (all labs ordered are listed, but only abnormal results are displayed) Labs Reviewed  COMPREHENSIVE METABOLIC PANEL - Abnormal; Notable for the following:       Result Value   Glucose, Bld 146 (*)    BUN <5 (*)    All other components within normal limits  URINALYSIS, ROUTINE W REFLEX MICROSCOPIC - Abnormal; Notable for the following:    Color, Urine STRAW (*)    Specific Gravity, Urine 1.002 (*)    All other components within normal limits  LIPASE, BLOOD  CBC  I-STAT BETA HCG BLOOD, ED ([facility], [facility], [facility] ONLY)    EKG  EKG Interpretation None       Radiology No results found.  Procedures Procedures (including critical care time)  Medications Ordered in ED Medications - No data to display   Initial Impression / Assessment and Plan / ED Course  I have reviewed the triage vital signs and the nursing  notes.  Pertinent labs & imaging results that were available during my care of the patient were reviewed by me and considered in my medical decision making (see chart for details).     Patient with history of gall stones presents with abdominal pain and nausea, concerned that her gall stones were the cause. Subjective fever yesterday.   Labs today are unremarkable. Normal liver functions, no leukocytosis. CT abd/pel performed because pain was more diffuse and not specific to RUQ. Levsin provided and patient appears comfortable. VSS. No evidence to suggest cholecystitis. Gall stones seen again.   She can be discharged home with referral to surgery.   Final Clinical Impressions(s) / ED Diagnoses   Final diagnoses:  None   1. Abdominal pain 2. Cholelithiasis   New Prescriptions New Prescriptions   No medications on file     Elpidio Anis, Cordelia Poche 03/03/17 0057    Shon Baton, MD 03/03/17 [phone]

## 2017-03-02 NOTE — Discharge Instructions (Signed)
Call Dr. Dixon BoosWyatt's office to schedule a time to be seen for further management of gall stones.

## 2017-03-07 ENCOUNTER — Ambulatory Visit (INDEPENDENT_AMBULATORY_CARE_PROVIDER_SITE_OTHER): Payer: Self-pay | Admitting: Family Medicine

## 2017-03-07 ENCOUNTER — Encounter: Payer: Self-pay | Admitting: Family Medicine

## 2017-03-07 VITALS — BP 160/78 | HR 72 | Temp 97.7°F | Resp 14 | Ht 64.0 in | Wt 138.0 lb

## 2017-03-07 DIAGNOSIS — K8020 Calculus of gallbladder without cholecystitis without obstruction: Secondary | ICD-10-CM

## 2017-03-07 DIAGNOSIS — R6889 Other general symptoms and signs: Secondary | ICD-10-CM

## 2017-03-07 DIAGNOSIS — K819 Cholecystitis, unspecified: Secondary | ICD-10-CM

## 2017-03-07 DIAGNOSIS — R1084 Generalized abdominal pain: Secondary | ICD-10-CM

## 2017-03-07 LAB — CBC WITH DIFFERENTIAL/PLATELET
BASOS ABS: 0 {cells}/uL (ref 0–200)
Basophils Relative: 0 %
EOS PCT: 2 %
Eosinophils Absolute: 116 cells/uL (ref 15–500)
HCT: 36.9 % (ref 35.0–45.0)
HEMOGLOBIN: 12.3 g/dL (ref 11.7–15.5)
LYMPHS ABS: 1450 {cells}/uL (ref 850–3900)
LYMPHS PCT: 25 %
MCH: 30.4 pg (ref 27.0–33.0)
MCHC: 33.3 g/dL (ref 32.0–36.0)
MCV: 91.1 fL (ref 80.0–100.0)
MPV: 9 fL (ref 7.5–12.5)
Monocytes Absolute: 522 cells/uL (ref 200–950)
Monocytes Relative: 9 %
NEUTROS PCT: 64 %
Neutro Abs: 3712 cells/uL (ref 1500–7800)
Platelets: 155 10*3/uL (ref 140–400)
RBC: 4.05 MIL/uL (ref 3.80–5.10)
RDW: 14.9 % (ref 11.0–15.0)
WBC: 5.8 10*3/uL (ref 3.8–10.8)

## 2017-03-07 LAB — POCT URINALYSIS DIP (DEVICE)
BILIRUBIN URINE: NEGATIVE
Glucose, UA: NEGATIVE mg/dL
Ketones, ur: NEGATIVE mg/dL
NITRITE: NEGATIVE
PH: 5.5 (ref 5.0–8.0)
Protein, ur: NEGATIVE mg/dL
Specific Gravity, Urine: <=1.005 (ref 1.005–1.030)
Urobilinogen, UA: 0.2 mg/dL (ref 0.0–1.0)

## 2017-03-07 LAB — COMPLETE METABOLIC PANEL WITH GFR
ALBUMIN: 3.8 g/dL (ref 3.6–5.1)
ALK PHOS: 79 U/L (ref 33–115)
ALT: 14 U/L (ref 6–29)
AST: 15 U/L (ref 10–35)
BILIRUBIN TOTAL: 0.4 mg/dL (ref 0.2–1.2)
BUN: 6 mg/dL — ABNORMAL LOW (ref 7–25)
CO2: 27 mmol/L (ref 20–31)
Calcium: 9 mg/dL (ref 8.6–10.2)
Chloride: 105 mmol/L (ref 98–110)
Creat: 0.62 mg/dL (ref 0.50–1.10)
Glucose, Bld: 82 mg/dL (ref 65–99)
Potassium: 4.5 mmol/L (ref 3.5–5.3)
Sodium: 140 mmol/L (ref 135–146)
TOTAL PROTEIN: 6.4 g/dL (ref 6.1–8.1)

## 2017-03-07 MED ORDER — TRAMADOL HCL 50 MG PO TABS: 50.0000 mg | ORAL_TABLET | Freq: Three times a day (TID) | ORAL | 0 refills | Status: DC | PRN

## 2017-03-07 NOTE — Patient Instructions (Addendum)
If abdominal pain worsens, or vomiting becomes bilious (green or yellow). Go to the emergency room for further diagnostic testing.  I am prescribing you Tramadol 50-100 mg every 8 hours as needed for pain.    Cholecystitis Cholecystitis is swelling and irritation (inflammation) of the gallbladder. The gallbladder is an organ that is shaped like a pear. It is under the liver on the right side of the body. This condition is often caused by gallstones. You doctor may do tests to see how your gallbladder works. These tests may include:  Imaging tests, such as: ? An ultrasound. ? MRI.  Tests that check how your liver works.  This condition needs treatment. Follow these instructions at home: Home care will depend on your treatment. In general:  Take over-the-counter and prescription medicines only as told by your doctor.  If you were prescribed an antibiotic medicine, take it as told by your doctor. Do not stop taking the antibiotic even if you start to feel better.  Follow instructions from your doctor about what to eat or drink. When you are allowed to eat, avoid eating or drinking anything that causes your symptoms to start.  Keep all follow-up visits as told by your doctor. This is important.  Contact a doctor if:  You have pain and your medicine does not help.  You have a fever. Get help right away if:  Your pain moves to: ? Another part of your belly (abdomen). ? Your back.  Your symptoms do not go away.  You have new symptoms. This information is not intended to replace advice given to you by your health care provider. Make sure you discuss any questions you have with your health care provider. Document Released: 09/12/2011 Document Revised: 02/29/2016 Document Reviewed: 01/04/2015 Elsevier Interactive Patient Education  2018 [employer]

## 2017-03-07 NOTE — Progress Notes (Signed)
Patient ID: Misty Mccullough, female    DOB: 08-24-1971, 46 y.o.   MRN: [id number]  PCP: Misty Mccullough  Chief Complaint  Patient presents with  . Follow-up    Gall stones    Subjective:  HPI  Misty Mccullough is a 46 y.o. female presents for evaluation of ongoing abdominal pain.  Medical problems include: Hypertension and Type 2 Diabetes   Misty Mccullough originally presented in office 02/04/2017, with a complaint of generalized abdominal pain which at that point had been ongoing for a period of 3 months.  An ultrasound abdominal wall of the  Gallbladder was enlarged and further evaluation by GI was recommended, however patient is uninsured and had  not applied for any healthcare financial assistance and could not afford to pay out of pocket.  On 03/02/2017, [facility] complaining of worsening abdominal pain. CT of abdomen was significant for  cholelithiasis and absent of acute cholecystitis. She was discharged from the ED and a follow-up was scheduled with  Instructions to schedule a follow-up with surgeon Dr. Jimmye NormanJames Mccullough.  Since ED visit, she complains of worsening abdominal pain, inability to tolerate food without vomiting.  Report vomitus is non-digested food and is not green or dark colored. Feels feverish and chills at bedtime. Reports that she had discontinued taking her acid reflux medication and continues to experience symptoms of  Burning in her throat and nausea which worsens at bedtime.    Social History   Social History  . Marital status: Married    Spouse name: N/A  . Number of children: N/A  . Years of education: N/A   Occupational History  . Not on file.   Social History Main Topics  . Smoking status: Current Every Day Smoker    Packs/day: 1.00  . Smokeless tobacco: Never Used  . Alcohol use No  . Drug use: No  . Sexual activity: Not on file   Other Topics Concern  . Not on file   Social History Narrative  . No narrative on file    Family  History  Problem Relation Age of Onset  . Diabetes Mother   . Uterine cancer Mother   . Diabetes Father   . Heart attack Father [id number]  . Stroke Father   . Heart attack Paternal Grandmother   . Heart attack Paternal Grandfather    Review of Systems See HPI Patient Active Problem List   Diagnosis Date Noted  . Malnutrition of moderate degree 08/14/2016  . Chest pain 08/13/2016  . Back pain 08/13/2016  . Essential hypertension 08/13/2016  . HLD (hyperlipidemia) 08/13/2016  . Diabetes mellitus with complication (HCC) 08/13/2016  . Chest pain, exertional     No Known Allergies  Prior to Admission medications   Medication Sig Start Date End Date Taking? Authorizing Provider  aspirin EC 81 MG tablet Take 1 tablet (81 mg total) by mouth daily. 12/06/16  Yes Massie MaroonHollis, Lachina M, Mccullough  diclofenac sodium (VOLTAREN) 1 % GEL Apply 4 g topically 4 (four) times daily. 10/07/16  Yes Pisciotta, Joni ReiningNicole, PA-C  dicyclomine (BENTYL) 10 MG capsule Take 1 capsule (10 mg total) by mouth 3 (three) times daily before meals. 02/04/17  Yes Misty NeighborsHarris, Desire Fulp S, Mccullough  feeding supplement, ENSURE ENLIVE, (ENSURE ENLIVE) LIQD Take 237 mLs by mouth 2 (two) times daily between meals. 08/14/16  Yes Regalado, Belkys A, MD  fluticasone (FLONASE) 50 MCG/ACT nasal spray Place 2 sprays into both nostrils daily. 09/06/16  Yes Henrietta HooverBernhardt, Linda C, NP  gabapentin (NEURONTIN) 300 MG capsule Take 1 capsule (300 mg total) by mouth 3 (three) times daily. 12/06/16  Yes Massie Maroon, Mccullough  lisinopril (PRINIVIL,ZESTRIL) 20 MG tablet Take 2 tablets (40 mg total) by mouth daily. 02/04/17  Yes Misty Neighbors, Mccullough  metFORMIN (GLUCOPHAGE) 500 MG tablet Take 1 tablet (500 mg total) by mouth 2 (two) times daily with a meal. 12/06/16  Yes Massie Maroon, Mccullough  methocarbamol (ROBAXIN) 500 MG tablet Can take up to 1 tabs every 6 hours PRN PAIN 12/11/16  Yes Massie Maroon, Mccullough  Multiple Vitamin (MULTIVITAMIN WITH MINERALS) TABS tablet Take 1 tablet by  mouth daily.   Yes [provider]  omeprazole (PRILOSEC) 20 MG capsule Take 1 capsule (20 mg total) by mouth 2 (two) times daily with a meal. 02/04/17  Yes Misty Neighbors, Mccullough  pravastatin (PRAVACHOL) 20 MG tablet Take 1 tablet (20 mg total) by mouth daily at 6 PM. 12/06/16  Yes Massie Maroon, Mccullough    Past Medical, Surgical Family and Social History reviewed and updated.    Objective:   Today's Vitals   03/07/17 0832 03/07/17 0840  BP: (!) 160/70 (!) 158/76  Pulse: 72   Resp: 14   Temp: 97.7 F (36.5 C)   TempSrc: Oral   SpO2: 100%   Weight: 138 lb (62.6 kg)   Height: 5\' 4"  (1.626 m)     Wt Readings from Last 3 Encounters:  03/07/17 138 lb (62.6 kg)  02/04/17 135 lb (61.2 kg)  12/06/16 129 lb (58.5 kg)    Physical Exam  Constitutional: She is oriented to person, place, and time. She appears cachectic. She appears ill.  HENT:  Head: Normocephalic and atraumatic.  Eyes: Conjunctivae are normal. Pupils are equal, round, and reactive to light.  Neck: Normal range of motion. Neck supple.  Cardiovascular: Normal rate, regular rhythm, normal heart sounds and intact distal pulses.   Pulmonary/Chest: Effort normal and breath sounds normal.  Abdominal: Normal appearance. She exhibits no distension, no fluid wave and no ascites. Bowel sounds are decreased. There is no hepatosplenomegaly. There is generalized tenderness. There is no rigidity, no guarding and no CVA tenderness.    Musculoskeletal: Normal range of motion.  Neurological: She is alert and oriented to person, place, and time.  Skin: Skin is warm and dry.  Psychiatric: Her speech is normal and behavior is normal. Judgment and thought content normal. Cognition and memory are normal.  Flat affect "baseline for patient"    Ct Abdomen Pelvis W Contrast  Result Date: 03/02/2017 CLINICAL DATA:  Centralized abdominal pain beginning today. History of gallstones, hypertension, diabetes. EXAM: CT ABDOMEN AND PELVIS  WITH CONTRAST TECHNIQUE: Multidetector CT imaging of the abdomen and pelvis was performed using the standard protocol following bolus administration of intravenous contrast. CONTRAST:  ISOVUE-300 IOPAMIDOL (ISOVUE-300) INJECTION 61% COMPARISON:  Abdominal sonogram Feb 06, 2017 and MRI of the lumbar spine September 30, 2016 FINDINGS: LOWER CHEST: Lung bases are clear. Included heart size is normal. No pericardial effusion. Mildly thickened esophagus though, the lumen is effaced. HEPATOBILIARY: Subcentimeter gallstone at the neck without CT findings of acute cholecystitis. Normal liver. PANCREAS: Normal. SPLEEN: Normal. ADRENALS/URINARY TRACT: Kidneys are orthotopic, demonstrating symmetric enhancement. No nephrolithiasis, hydronephrosis or solid renal masses. 13 mm simple cyst LEFT interpolar kidney. The unopacified ureters are normal in course and caliber. Delayed imaging through the kidneys demonstrates symmetric prompt contrast excretion within the proximal urinary collecting system. Urinary bladder is well distended  and unremarkable. Normal adrenal glands. STOMACH/BOWEL: Mild distal stomach wall thickening and edema. The small and large bowel are normal in course and caliber without inflammatory changes. Mild volume retained large bowel stool. Normal appendix. VASCULAR/LYMPHATIC: Aortoiliac vessels are normal in course and caliber, moderate calcific atherosclerosis. No lymphadenopathy by CT size criteria. REPRODUCTIVE: Normal. OTHER: Trace free fluid in the pelvic cul-de-sac is likely physiologic. MUSCULOSKELETAL: Nonacute. Osteopenia. Old LEFT sacral fracture, likely insufficiency origin. IMPRESSION: Cholelithiasis without CT findings of acute cholecystitis though, ultrasound is more sensitive. Mild gastritis and possible esophagitis. Moderate atherosclerosis. Electronically Signed   By: Awilda Metro M.D.   On: 03/02/2017 04:15     Assessment & Plan:  1. Calculus of gallbladder without cholecystitis  without obstruction 2. Abdominal Pain 3. Ill feeling  Diagnostic Test Order This Encounter  - CBC with Differential - Amylase - COMPLETE METABOLIC PANEL WITH GFR - Lipase  Plan: -Tramadol 50-100 mg every 8 hours as needed for moderate to severe abdominal pain. -Keep follow-up with Dr. Lindie Spruce -Complete [place] Financial Assistance Application -The patient was given clear instructions to go to ER or return to medical center if symptoms do not improve, worsen or new problems develop. The patient verbalized understanding.   RTC: 4 weeks for follow-up abdominal pain. Refer to GI for further evaluation of esophagitis if patient assistance approved.   Godfrey Pick. Tiburcio Pea, MSN, Mccullough-C [facility]  [address] [place], [place] [address] [phone]

## 2017-03-08 LAB — AMYLASE: AMYLASE: 37 U/L (ref 21–101)

## 2017-03-08 LAB — LIPASE: Lipase: 6 U/L — ABNORMAL LOW (ref 7–60)

## 2017-03-10 ENCOUNTER — Ambulatory Visit: Payer: Self-pay | Admitting: Family Medicine

## 2017-03-31 ENCOUNTER — Ambulatory Visit (INDEPENDENT_AMBULATORY_CARE_PROVIDER_SITE_OTHER): Payer: Self-pay | Admitting: Family Medicine

## 2017-03-31 ENCOUNTER — Encounter: Payer: Self-pay | Admitting: Family Medicine

## 2017-03-31 VITALS — BP 140/86 | HR 80 | Temp 98.0°F | Resp 16 | Ht 64.0 in | Wt 143.0 lb

## 2017-03-31 DIAGNOSIS — K2100 Gastro-esophageal reflux disease with esophagitis, without bleeding: Secondary | ICD-10-CM

## 2017-03-31 DIAGNOSIS — R1084 Generalized abdominal pain: Secondary | ICD-10-CM

## 2017-03-31 LAB — POCT URINALYSIS DIP (DEVICE)
Bilirubin Urine: NEGATIVE
Glucose, UA: NEGATIVE mg/dL
HGB URINE DIPSTICK: NEGATIVE
Ketones, ur: NEGATIVE mg/dL
Leukocytes, UA: NEGATIVE
Nitrite: NEGATIVE
PROTEIN: NEGATIVE mg/dL
UROBILINOGEN UA: 0.2 mg/dL (ref 0.0–1.0)
pH: 5.5 (ref 5.0–8.0)

## 2017-03-31 NOTE — Progress Notes (Signed)
Patient ID: Misty DenseDebra Faughnan, female    DOB: March 08, 1971, 46 y.o.   MRN: [id number]  PCP: Bing NeighborsHarris, Shahiem Bedwell S, FNP  Chief Complaint  Patient presents with  . Follow-up    abdominal pain    Subjective:  HPI Misty Mccullough is a 46 y.o. female presents for evaluation of ongoing abdominal pain. Symptoms have improved significantly since her last office visit, 03/07/2017 . She is now able to tolerate food. She has consistently taken her omeprazole twice a day and is only taking tramadol at night when she reports her abdominal pain. She denies any fever, chills, nausea, vomiting, or diarrhea. She continues to have normal bowel movements. She has gone ahead and rescheduled her appointment with General Surgery for 04/10/2017 as she still is having chronic abdominal pain during the nighttime hours. CT scan dated 03/02/2017 did confirm cholelithiasis without cholecystitis, however noted possible gastritis and esophagitis.  Social History   Social History  . Marital status: Married    Spouse name: N/A  . Number of children: N/A  . Years of education: N/A   Occupational History  . Not on file.   Social History Main Topics  . Smoking status: Current Every Day Smoker    Packs/day: 1.00  . Smokeless tobacco: Never Used  . Alcohol use No  . Drug use: No  . Sexual activity: Not on file   Other Topics Concern  . Not on file   Social History Narrative  . No narrative on file    Family History  Problem Relation Age of Onset  . Diabetes Mother   . Uterine cancer Mother   . Diabetes Father   . Heart attack Father 662  . Stroke Father   . Heart attack Paternal Grandmother   . Heart attack Paternal Grandfather    Review of Systems See history of present illness   Patient Active Problem List   Diagnosis Date Noted  . Malnutrition of moderate degree 08/14/2016  . Chest pain 08/13/2016  . Back pain 08/13/2016  . Essential hypertension 08/13/2016  . HLD (hyperlipidemia) 08/13/2016  . Diabetes  mellitus with complication (HCC) 08/13/2016  . Chest pain, exertional     No Known Allergies  Prior to Admission medications   Medication Sig Start Date End Date Taking? Authorizing Provider  aspirin EC 81 MG tablet Take 1 tablet (81 mg total) by mouth daily. 12/06/16  Yes Massie MaroonHollis, Lachina M, FNP  diclofenac sodium (VOLTAREN) 1 % GEL Apply 4 g topically 4 (four) times daily. 10/07/16  Yes Pisciotta, Joni ReiningNicole, PA-C  dicyclomine (BENTYL) 10 MG capsule Take 1 capsule (10 mg total) by mouth 3 (three) times daily before meals. 02/04/17  Yes Bing NeighborsHarris, Dary Dilauro S, FNP  feeding supplement, ENSURE ENLIVE, (ENSURE ENLIVE) LIQD Take 237 mLs by mouth 2 (two) times daily between meals. 08/14/16  Yes Regalado, Belkys A, MD  fluticasone (FLONASE) 50 MCG/ACT nasal spray Place 2 sprays into both nostrils daily. 09/06/16  Yes Henrietta HooverBernhardt, Linda C, NP  gabapentin (NEURONTIN) 300 MG capsule Take 1 capsule (300 mg total) by mouth 3 (three) times daily. 12/06/16  Yes Massie MaroonHollis, Lachina M, FNP  lisinopril (PRINIVIL,ZESTRIL) 20 MG tablet Take 2 tablets (40 mg total) by mouth daily. 02/04/17  Yes Bing NeighborsHarris, Areg Bialas S, FNP  metFORMIN (GLUCOPHAGE) 500 MG tablet Take 1 tablet (500 mg total) by mouth 2 (two) times daily with a meal. 12/06/16  Yes Massie MaroonHollis, Lachina M, FNP  methocarbamol (ROBAXIN) 500 MG tablet Can take up to 1 tabs every 6 hours  PRN PAIN 12/11/16  Yes Massie Maroon, FNP  Multiple Vitamin (MULTIVITAMIN WITH MINERALS) TABS tablet Take 1 tablet by mouth daily.   Yes [provider]  omeprazole (PRILOSEC) 20 MG capsule Take 1 capsule (20 mg total) by mouth 2 (two) times daily with a meal. 02/04/17  Yes Bing Neighbors, FNP  pravastatin (PRAVACHOL) 20 MG tablet Take 1 tablet (20 mg total) by mouth daily at 6 PM. 12/06/16  Yes Massie Maroon, FNP  traMADol (ULTRAM) 50 MG tablet Take 1-2 tablets (50-100 mg total) by mouth every 8 (eight) hours as needed. 03/07/17  Yes Bing Neighbors, FNP    Past Medical, Surgical Family and  Social History reviewed and updated.    Objective:   Today's Vitals   03/31/17 1005  BP: 140/86  Pulse: 80  Resp: 16  Temp: 98 F (36.7 C)  TempSrc: Oral  SpO2: 99%  Weight: 143 lb (64.9 kg)  Height: 5\' 4"  (1.626 m)    Wt Readings from Last 3 Encounters:  03/31/17 143 lb (64.9 kg)  03/07/17 138 lb (62.6 kg)  02/04/17 135 lb (61.2 kg)    Physical Exam  Constitutional: She is oriented to person, place, and time. She appears well-developed and well-nourished.  HENT:  Head: Normocephalic and atraumatic.  Eyes: Conjunctivae and EOM are normal. Pupils are equal, round, and reactive to light. No scleral icterus.  Cardiovascular: Normal rate, regular rhythm, normal heart sounds and intact distal pulses.   Pulmonary/Chest: Effort normal and breath sounds normal.  Abdominal: Soft. Bowel sounds are normal. She exhibits no distension and no mass. There is no rebound and no guarding.  Musculoskeletal: Normal range of motion.  Neurological: She is alert and oriented to person, place, and time.  Psychiatric: She has a normal mood and affect. Her behavior is normal. Judgment and thought content normal.   Assessment & Plan:  1. Generalized abdominal pain 2. Gastroesophageal reflux disease with esophagitis  -Continue tramadol 50-100 mg every 8 hours as needed for abdominal pain. -Continue omeprazole 20 mg twice a day -Keep follow-up appointment with general surgery on 04/10/2017  RTC: 3 months for follow-up of abdominal pain  Godfrey Pick. Tiburcio Pea, MSN, FNP-C [facility]  [address] [place], [place] [address] [phone]

## 2017-03-31 NOTE — Patient Instructions (Signed)
Keep scheduled follow-up with general surgery.  Continue all medications as prescribed.    Cholelithiasis Cholelithiasis is also called "gallstones." It is a kind of gallbladder disease. The gallbladder is an organ that stores a liquid (bile) that helps you digest fat. Gallstones may not cause symptoms (may be silent gallstones) until they cause a blockage, and then they can cause pain (gallbladder attack). Follow these instructions at home:  Take over-the-counter and prescription medicines only as told by your doctor.  Stay at a healthy weight.  Eat healthy foods. This includes: ? Eating fewer fatty foods, like fried foods. ? Eating fewer refined carbs (refined carbohydrates). Refined carbs are breads and grains that are highly processed, like white bread and white rice. Instead, choose whole grains like whole-wheat bread and brown rice. ? Eating more fiber. Almonds, fresh fruit, and beans are healthy sources of fiber.  Keep all follow-up visits as told by your doctor. This is important. Contact a doctor if:  You have sudden pain in the upper right side of your belly (abdomen). Pain might spread to your right shoulder or your chest. This may be a sign of a gallbladder attack.  You feel sick to your stomach (are nauseous).  You throw up (vomit).  You have been diagnosed with gallstones that have no symptoms and you get: ? Belly pain. ? Discomfort, burning, or fullness in the upper part of your belly (indigestion). Get help right away if:  You have sudden pain in the upper right side of your belly, and it lasts for more than 2 hours.  You have belly pain that lasts for more than 5 hours.  You have a fever or chills.  You keep feeling sick to your stomach or you keep throwing up.  Your skin or the whites of your eyes turn yellow (jaundice).  You have dark-colored pee (urine).  You have light-colored poop (stool). Summary  Cholelithiasis is also called  "gallstones."  The gallbladder is an organ that stores a liquid (bile) that helps you digest fat.  Silent gallstones are gallstones that do not cause symptoms.  A gallbladder attack may cause sudden pain in the upper right side of your belly. Pain might spread to your right shoulder or your chest. If this happens, contact your doctor.  If you have sudden pain in the upper right side of your belly that lasts for more than 2 hours, get help right away. This information is not intended to replace advice given to you by your health care provider. Make sure you discuss any questions you have with your health care provider. Document Released: 03/11/2008 Document Revised: 06/09/2016 Document Reviewed: 06/09/2016 Elsevier Interactive Patient Education  2017 [employer]

## 2017-04-04 ENCOUNTER — Telehealth: Payer: Self-pay

## 2017-04-04 DIAGNOSIS — I10 Essential (primary) hypertension: Secondary | ICD-10-CM

## 2017-04-04 MED ORDER — LISINOPRIL 20 MG PO TABS: 40.0000 mg | ORAL_TABLET | Freq: Every day | ORAL | 1 refills | Status: DC

## 2017-04-04 NOTE — Telephone Encounter (Signed)
Lisinopril was refilled.

## 2017-04-08 ENCOUNTER — Telehealth: Payer: Self-pay

## 2017-04-08 MED ORDER — TRAMADOL HCL 50 MG PO TABS: 50.0000 mg | ORAL_TABLET | Freq: Three times a day (TID) | ORAL | 0 refills | Status: DC | PRN

## 2017-04-08 NOTE — Telephone Encounter (Signed)
Refilled Tramadol for 30 days. Karren BurlyChandra please call patient to advise prescription is available for pick-up

## 2017-04-24 ENCOUNTER — Telehealth: Payer: Self-pay

## 2017-04-24 ENCOUNTER — Other Ambulatory Visit: Payer: Self-pay | Admitting: Family Medicine

## 2017-04-24 DIAGNOSIS — E118 Type 2 diabetes mellitus with unspecified complications: Secondary | ICD-10-CM

## 2017-04-24 MED ORDER — METFORMIN HCL 500 MG PO TABS: 500.0000 mg | ORAL_TABLET | Freq: Two times a day (BID) | ORAL | 2 refills | Status: DC

## 2017-04-24 NOTE — Telephone Encounter (Signed)
Refilled metformin 90 day supply with 2 refills

## 2017-04-24 NOTE — Progress Notes (Signed)
Refilled metformin

## 2017-07-01 ENCOUNTER — Ambulatory Visit: Payer: Self-pay | Admitting: Family Medicine

## 2017-07-10 ENCOUNTER — Telehealth: Payer: Self-pay

## 2017-07-10 DIAGNOSIS — R1084 Generalized abdominal pain: Secondary | ICD-10-CM

## 2017-07-10 DIAGNOSIS — I10 Essential (primary) hypertension: Secondary | ICD-10-CM

## 2017-07-10 MED ORDER — OMEPRAZOLE 20 MG PO CPDR: 20.0000 mg | DELAYED_RELEASE_CAPSULE | Freq: Two times a day (BID) | ORAL | 1 refills | Status: DC

## 2017-07-10 MED ORDER — LISINOPRIL 20 MG PO TABS: 40.0000 mg | ORAL_TABLET | Freq: Every day | ORAL | 1 refills | Status: DC

## 2017-07-10 NOTE — Telephone Encounter (Signed)
Refills sent into pharmacy. Thanks!  

## 2017-10-14 ENCOUNTER — Telehealth: Payer: Self-pay

## 2017-10-14 DIAGNOSIS — I10 Essential (primary) hypertension: Secondary | ICD-10-CM

## 2017-10-14 MED ORDER — LISINOPRIL 20 MG PO TABS: 40.0000 mg | ORAL_TABLET | Freq: Every day | ORAL | 0 refills | Status: DC

## 2017-10-14 NOTE — Telephone Encounter (Signed)
Medication refilled and not sent that patient needs office visit

## 2017-12-03 ENCOUNTER — Telehealth: Payer: Self-pay

## 2017-12-03 DIAGNOSIS — E118 Type 2 diabetes mellitus with unspecified complications: Secondary | ICD-10-CM

## 2017-12-03 DIAGNOSIS — I10 Essential (primary) hypertension: Secondary | ICD-10-CM

## 2017-12-03 MED ORDER — LISINOPRIL 20 MG PO TABS: 40.0000 mg | ORAL_TABLET | Freq: Every day | ORAL | 0 refills | Status: DC

## 2017-12-03 MED ORDER — METFORMIN HCL 500 MG PO TABS
500.0000 mg | ORAL_TABLET | Freq: Two times a day (BID) | ORAL | 0 refills | Status: DC
Start: 2017-12-03 — End: 2017-12-29

## 2017-12-03 NOTE — Telephone Encounter (Signed)
Patient notified that a small supply has been called in until appointment on 12/16/17

## 2017-12-15 ENCOUNTER — Inpatient Hospital Stay (HOSPITAL_COMMUNITY): Payer: Self-pay

## 2017-12-15 ENCOUNTER — Inpatient Hospital Stay (HOSPITAL_COMMUNITY)
Admission: EM | Admit: 2017-12-15 | Discharge: 2017-12-16 | DRG: 066 | Disposition: A | Discharge status: Home / Self Care | Payer: Self-pay | Attending: Internal Medicine | Admitting: Internal Medicine

## 2017-12-15 ENCOUNTER — Emergency Department (HOSPITAL_COMMUNITY): Payer: Self-pay

## 2017-12-15 ENCOUNTER — Other Ambulatory Visit: Payer: Self-pay

## 2017-12-15 ENCOUNTER — Encounter (HOSPITAL_COMMUNITY): Payer: Self-pay | Admitting: Emergency Medicine

## 2017-12-15 DIAGNOSIS — E1142 Type 2 diabetes mellitus with diabetic polyneuropathy: Secondary | ICD-10-CM | POA: Diagnosis present

## 2017-12-15 DIAGNOSIS — Z8249 Family history of ischemic heart disease and other diseases of the circulatory system: Secondary | ICD-10-CM

## 2017-12-15 DIAGNOSIS — E1169 Type 2 diabetes mellitus with other specified complication: Secondary | ICD-10-CM

## 2017-12-15 DIAGNOSIS — R262 Difficulty in walking, not elsewhere classified: Secondary | ICD-10-CM

## 2017-12-15 DIAGNOSIS — F10230 Alcohol dependence with withdrawal, uncomplicated: Secondary | ICD-10-CM

## 2017-12-15 DIAGNOSIS — I6381 Other cerebral infarction due to occlusion or stenosis of small artery: Principal | ICD-10-CM | POA: Diagnosis present

## 2017-12-15 DIAGNOSIS — E1159 Type 2 diabetes mellitus with other circulatory complications: Secondary | ICD-10-CM

## 2017-12-15 DIAGNOSIS — Z7984 Long term (current) use of oral hypoglycemic drugs: Secondary | ICD-10-CM

## 2017-12-15 DIAGNOSIS — Z7951 Long term (current) use of inhaled steroids: Secondary | ICD-10-CM

## 2017-12-15 DIAGNOSIS — F17210 Nicotine dependence, cigarettes, uncomplicated: Secondary | ICD-10-CM | POA: Diagnosis present

## 2017-12-15 DIAGNOSIS — K219 Gastro-esophageal reflux disease without esophagitis: Secondary | ICD-10-CM | POA: Diagnosis present

## 2017-12-15 DIAGNOSIS — E1151 Type 2 diabetes mellitus with diabetic peripheral angiopathy without gangrene: Secondary | ICD-10-CM | POA: Diagnosis present

## 2017-12-15 DIAGNOSIS — R29701 NIHSS score 1: Secondary | ICD-10-CM | POA: Diagnosis present

## 2017-12-15 DIAGNOSIS — R9431 Abnormal electrocardiogram [ECG] [EKG]: Secondary | ICD-10-CM

## 2017-12-15 DIAGNOSIS — Z7289 Other problems related to lifestyle: Secondary | ICD-10-CM

## 2017-12-15 DIAGNOSIS — Z79891 Long term (current) use of opiate analgesic: Secondary | ICD-10-CM

## 2017-12-15 DIAGNOSIS — J410 Simple chronic bronchitis: Secondary | ICD-10-CM

## 2017-12-15 DIAGNOSIS — Z7982 Long term (current) use of aspirin: Secondary | ICD-10-CM

## 2017-12-15 DIAGNOSIS — Z716 Tobacco abuse counseling: Secondary | ICD-10-CM

## 2017-12-15 DIAGNOSIS — I639 Cerebral infarction, unspecified: Secondary | ICD-10-CM | POA: Diagnosis present

## 2017-12-15 DIAGNOSIS — I10 Essential (primary) hypertension: Secondary | ICD-10-CM | POA: Diagnosis present

## 2017-12-15 DIAGNOSIS — E1165 Type 2 diabetes mellitus with hyperglycemia: Secondary | ICD-10-CM | POA: Diagnosis present

## 2017-12-15 DIAGNOSIS — E785 Hyperlipidemia, unspecified: Secondary | ICD-10-CM | POA: Diagnosis present

## 2017-12-15 DIAGNOSIS — Z720 Tobacco use: Secondary | ICD-10-CM | POA: Diagnosis present

## 2017-12-15 DIAGNOSIS — R200 Anesthesia of skin: Secondary | ICD-10-CM

## 2017-12-15 DIAGNOSIS — Z79899 Other long term (current) drug therapy: Secondary | ICD-10-CM

## 2017-12-15 DIAGNOSIS — E118 Type 2 diabetes mellitus with unspecified complications: Secondary | ICD-10-CM | POA: Diagnosis present

## 2017-12-15 DIAGNOSIS — Z823 Family history of stroke: Secondary | ICD-10-CM

## 2017-12-15 DIAGNOSIS — Z833 Family history of diabetes mellitus: Secondary | ICD-10-CM

## 2017-12-15 LAB — I-STAT CHEM 8, ED
BUN: 5 mg/dL — AB (ref 6–20)
CHLORIDE: 103 mmol/L (ref 101–111)
CREATININE: 0.4 mg/dL — AB (ref 0.44–1.00)
Calcium, Ion: 1.12 mmol/L — ABNORMAL LOW (ref 1.15–1.40)
Glucose, Bld: 280 mg/dL — ABNORMAL HIGH (ref 65–99)
HEMATOCRIT: 38 % (ref 36.0–46.0)
Hemoglobin: 12.9 g/dL (ref 12.0–15.0)
POTASSIUM: 4.2 mmol/L (ref 3.5–5.1)
Sodium: 138 mmol/L (ref 135–145)
TCO2: 25 mmol/L (ref 22–32)

## 2017-12-15 LAB — ETHANOL: Alcohol, Ethyl (B): <10 mg/dL (ref <10)

## 2017-12-15 LAB — COMPREHENSIVE METABOLIC PANEL
ALBUMIN: 3.5 g/dL (ref 3.5–5.0)
ALK PHOS: 76 U/L (ref 38–126)
ALT: 16 U/L (ref 14–54)
AST: 17 U/L (ref 15–41)
Anion gap: 9 (ref 5–15)
BUN: 5 mg/dL — AB (ref 6–20)
CO2: 25 mmol/L (ref 22–32)
Calcium: 9 mg/dL (ref 8.9–10.3)
Chloride: 104 mmol/L (ref 101–111)
Creatinine, Ser: 0.53 mg/dL (ref 0.44–1.00)
GFR calc Af Amer: >60 mL/min (ref >60)
GFR calc non Af Amer: >60 mL/min (ref >60)
GLUCOSE: 260 mg/dL — AB (ref 65–99)
POTASSIUM: 4.7 mmol/L (ref 3.5–5.1)
SODIUM: 138 mmol/L (ref 135–145)
Total Bilirubin: 0.6 mg/dL (ref 0.3–1.2)
Total Protein: 6.7 g/dL (ref 6.5–8.1)

## 2017-12-15 LAB — CBC
HCT: 38.9 % (ref 36.0–46.0)
HEMOGLOBIN: 12.9 g/dL (ref 12.0–15.0)
MCH: 29 pg (ref 26.0–34.0)
MCHC: 33.2 g/dL (ref 30.0–36.0)
MCV: 87.4 fL (ref 78.0–100.0)
PLATELETS: 202 10*3/uL (ref 150–400)
RBC: 4.45 MIL/uL (ref 3.87–5.11)
RDW: 14.8 % (ref 11.5–15.5)
WBC: 4.9 10*3/uL (ref 4.0–10.5)

## 2017-12-15 LAB — TROPONIN I: Troponin I: <0.03 ng/mL

## 2017-12-15 LAB — I-STAT TROPONIN, ED: Troponin i, poc: 0 ng/mL (ref 0.00–0.08)

## 2017-12-15 LAB — DIFFERENTIAL
BASOS ABS: 0 10*3/uL (ref 0.0–0.1)
Basophils Relative: 0 %
EOS ABS: 0.1 10*3/uL (ref 0.0–0.7)
Eosinophils Relative: 1 %
LYMPHS ABS: 1.8 10*3/uL (ref 0.7–4.0)
LYMPHS PCT: 37 %
Monocytes Absolute: 0.3 10*3/uL (ref 0.1–1.0)
Monocytes Relative: 5 %
NEUTROS PCT: 57 %
Neutro Abs: 2.7 10*3/uL (ref 1.7–7.7)

## 2017-12-15 LAB — PROTIME-INR
INR: 1
Prothrombin Time: 13.1 seconds (ref 11.4–15.2)

## 2017-12-15 LAB — HEMOGLOBIN A1C
HEMOGLOBIN A1C: 12.1 % — AB (ref 4.8–5.6)
MEAN PLASMA GLUCOSE: 300.57 mg/dL

## 2017-12-15 LAB — I-STAT BETA HCG BLOOD, ED (MC, WL, AP ONLY)

## 2017-12-15 LAB — GLUCOSE, CAPILLARY: GLUCOSE-CAPILLARY: 288 mg/dL — AB (ref 65–99)

## 2017-12-15 LAB — POC URINE PREG, ED: Preg Test, Ur: NEGATIVE

## 2017-12-15 LAB — APTT: APTT: 30 s (ref 24–36)

## 2017-12-15 MED ORDER — VITAMIN B-1 100 MG PO TABS
100.0000 mg | ORAL_TABLET | Freq: Every day | ORAL | Status: DC
  Administered 2017-12-15 – 2017-12-16 (×2): 100 mg via ORAL
  Filled 2017-12-15 (×2): qty 1

## 2017-12-15 MED ORDER — ATORVASTATIN CALCIUM 80 MG PO TABS
80.0000 mg | ORAL_TABLET | Freq: Every day | ORAL | Status: DC
  Administered 2017-12-16: 80 mg via ORAL
  Filled 2017-12-15 (×2): qty 1

## 2017-12-15 MED ORDER — FOLIC ACID 1 MG PO TABS
1.0000 mg | ORAL_TABLET | Freq: Every day | ORAL | Status: DC
  Administered 2017-12-15 – 2017-12-16 (×2): 1 mg via ORAL
  Filled 2017-12-15 (×2): qty 1

## 2017-12-15 MED ORDER — PANTOPRAZOLE SODIUM 40 MG PO TBEC: 40.0000 mg | DELAYED_RELEASE_TABLET | Freq: Every day | ORAL | Status: DC

## 2017-12-15 MED ORDER — ATORVASTATIN CALCIUM 40 MG PO TABS: 40.0000 mg | ORAL_TABLET | Freq: Every day | ORAL | Status: DC

## 2017-12-15 MED ORDER — ADULT MULTIVITAMIN W/MINERALS CH
1.0000 | ORAL_TABLET | Freq: Every day | ORAL | Status: DC
  Administered 2017-12-15 – 2017-12-16 (×2): 1 via ORAL
  Filled 2017-12-15 (×2): qty 1

## 2017-12-15 MED ORDER — SODIUM CHLORIDE 0.9 % IV SOLN: INTRAVENOUS | Status: DC

## 2017-12-15 MED ORDER — ASPIRIN EC 81 MG PO TBEC: 81.0000 mg | DELAYED_RELEASE_TABLET | Freq: Every day | ORAL | Status: DC

## 2017-12-15 MED ORDER — GABAPENTIN 300 MG PO CAPS: 300.0000 mg | ORAL_CAPSULE | Freq: Three times a day (TID) | ORAL | Status: DC

## 2017-12-15 MED ORDER — INSULIN ASPART 100 UNIT/ML ~~LOC~~ SOLN
0.0000 [IU] | Freq: Three times a day (TID) | SUBCUTANEOUS | Status: DC
  Administered 2017-12-16: 5 [IU] via SUBCUTANEOUS
  Administered 2017-12-16: 11 [IU] via SUBCUTANEOUS
  Administered 2017-12-16: 5 [IU] via SUBCUTANEOUS

## 2017-12-15 MED ORDER — LABETALOL HCL 5 MG/ML IV SOLN
10.0000 mg | Freq: Four times a day (QID) | INTRAVENOUS | Status: DC | PRN
  Filled 2017-12-15: qty 4

## 2017-12-15 MED ORDER — ACETAMINOPHEN 325 MG PO TABS
650.0000 mg | ORAL_TABLET | Freq: Four times a day (QID) | ORAL | Status: DC | PRN
  Administered 2017-12-15: 650 mg via ORAL
  Filled 2017-12-15: qty 2

## 2017-12-15 MED ORDER — INSULIN ASPART 100 UNIT/ML ~~LOC~~ SOLN
0.0000 [IU] | Freq: Every day | SUBCUTANEOUS | Status: DC
  Administered 2017-12-16: 3 [IU] via SUBCUTANEOUS

## 2017-12-15 NOTE — Consult Note (Addendum)
Neurology Consultation Reason for Consult:  Stroke Referring Physician: Dr. Sherry Ruffing  CC: Left facial and arm numbness  History is obtained from: Patient  HPI: Misty Mccullough is a 47 y.o. female with T2DM, HTN, HLD, and tobacco use who presents to the ED with left face and arm numbness with reported LUE weakness. She is accompanied by 2 daughters.  She reports being in her usual state of health (completes all ADLs, ambulates without assistance) prior to going to bed the night of 3/9. When she awoke, she noticed having numbness at the left side of her head which involved her left arm all the way down to the hand. She thought this would improve on its own and went about her day as usual. Today, she noticed that the numbness is involving more of the left side of her face towards her cheek. She also has felt as if her left arm was weaker than normal. She denies any other weakness. She reports chronic numbness in her feet which she attributes to her diabetes. She denies any recent headache, change in vision, chest pain, palpitations, or gait abnormality.  Her current medications include Metformin and Lisinopril. She takes a low dose aspirin only occasionally and admits she misses more doses than takes. She uses BC powders for headaches. She denies any obvious bleeding. She has smoked since age 52, currently reports 1.5 PPD (daughter thinks 2 PPD).  MRI Brain showed a 6 mm acute infarct in the right thalamus, changes associated with chronic small vessel disease, and questionable small right MCA bifurcation aneurysm.   LKW: >24 hours tpa given?: no, out of window Premorbid modified rankin scale: 0  NIHSS: 1  ROS: A 14 point ROS was performed and is negative except as noted in the HPI.   Past Medical History:  Diagnosis Date  . Diabetes mellitus without complication ([place])   . Gallstones   . Heavy cigarette smoker   . Hypercholesteremia   . Hypertension     Family History  Problem Relation Age  of Onset  . Diabetes Mother   . Uterine cancer Mother   . Diabetes Father   . Heart attack Father 66  . Stroke Father   . Heart attack Paternal Grandmother   . Heart attack Paternal Grandfather   . Stroke Sister   . Heart attack Paternal Uncle   . Bone cancer Paternal Uncle    Social History:  reports that she has been smoking.  She has been smoking about 1.50 packs per day. she has never used smokeless tobacco. She reports that she drinks alcohol. She reports that she does not use drugs.   Allergies: No Known Allergies  Medications: I have reviewed the patient's current medications.  Exam: Current vital signs: BP (!) 148/78   Pulse 71   Temp 98 F (36.7 C)   Resp 15   Ht 5' 4"  (1.626 m)   Wt 135 lb (61.2 kg)   LMP 12/11/2017   SpO2 97%   BMI 23.17 kg/m  Vital signs in last 24 hours: Temp:  [98 F (36.7 C)] 98 F (36.7 C) (03/11 1400) Pulse Rate:  [71-82] 71 (03/11 1430) Resp:  [13-19] 15 (03/11 1430) BP: (128-148)/(70-78) 148/78 (03/11 1430) SpO2:  [97 %-98 %] 97 % (03/11 1430) Weight:  [135 lb (61.2 kg)] 135 lb (61.2 kg) (03/11 1054)   Physical Exam  Constitutional: Appears older than stated age, well-developed and well-nourished.  Psych: Affect appropriate to situation Eyes: No scleral injection HENT: No OP  obstrucion Head: Normocephalic.  Cardiovascular: Normal rate and regular rhythm, systolic murmur left lower sternal border.  Respiratory: Effort normal and breath sounds normal to ascultation GI: Soft.  No distension. There is no tenderness.  Skin: WDI  Neuro: Mental Status: Patient is awake, alert, oriented to person, place, month, year, and situation. Patient is able to give a clear and coherent history. No signs of aphasia or neglect Cranial Nerves: II: Visual Fields are full. Pupils are equal, round, and reactive to light. III,IV, VI: EOMI without ptosis or diplopia.  V: Facial sensation is decreased on the left to light touch and temperature   VII: Facial movement is symmetric.  VIII: hearing is intact to voice X: Uvula elevates symmetrically XI: Shoulder shrug is symmetric. XII: Tongue is midline without atrophy or fasciculations.  Motor: Tone is normal. Bulk is normal. 5/5 strength was present in all four extremities.  Sensory: Sensation is decreased to light touch and temperature in the left arm, symmetric and equal in the legs. Deep Tendon Reflexes: 2+ and symmetric in the biceps and patellae.  Plantars: Toes are downgoing bilaterally.  Cerebellar: FNF and HKS are intact bilaterally, with exception of subtle dyssinergia with FNF on the left  I have reviewed labs in epic and the results pertinent to this consultation are:  Results for orders placed or performed during the hospital encounter of 12/15/17 (from the past 48 hour(s))  I-stat troponin, ED     Status: None   Collection Time: 12/15/17 12:00 PM  Result Value Ref Range   Troponin i, poc 0.00 0.00 - 0.08 ng/mL   Comment 3            Comment: Due to the release kinetics of cTnI, a negative result within the first hours of the onset of symptoms does not rule out myocardial infarction with certainty. If myocardial infarction is still suspected, repeat the test at appropriate intervals.   I-Stat beta hCG blood, ED     Status: None   Collection Time: 12/15/17 12:00 PM  Result Value Ref Range   I-stat hCG, quantitative <5.0 <5 mIU/mL   Comment 3            Comment:   GEST. AGE      CONC.  (mIU/mL)   <=1 WEEK        5 - 50     2 WEEKS       50 - 500     3 WEEKS       100 - 10,000     4 WEEKS     1,000 - 30,000        FEMALE AND NON-PREGNANT FEMALE:     LESS THAN 5 mIU/mL   I-Stat Chem 8, ED     Status: Abnormal   Collection Time: 12/15/17 12:01 PM  Result Value Ref Range   Sodium 138 135 - 145 mmol/L   Potassium 4.2 3.5 - 5.1 mmol/L   Chloride 103 101 - 111 mmol/L   BUN 5 (L) 6 - 20 mg/dL   Creatinine, Ser 0.40 (L) 0.44 - 1.00 mg/dL   Glucose, Bld 280  (H) 65 - 99 mg/dL   Calcium, Ion 1.12 (L) 1.15 - 1.40 mmol/L   TCO2 25 22 - 32 mmol/L   Hemoglobin 12.9 12.0 - 15.0 g/dL   HCT 38.0 36.0 - 46.0 %  POC Urine Pregnancy, ED (do NOT order at [facility])     Status: None   Collection Time: 12/15/17 12:17 PM  Result Value Ref Range   Preg Test, Ur NEGATIVE NEGATIVE    Comment:        THE SENSITIVITY OF THIS METHODOLOGY IS >24 mIU/mL   Ethanol     Status: None   Collection Time: 12/15/17  1:29 PM  Result Value Ref Range   Alcohol, Ethyl (B) <10 <10 mg/dL    Comment:        LOWEST DETECTABLE LIMIT FOR SERUM ALCOHOL IS 10 mg/dL FOR MEDICAL PURPOSES ONLY Performed at [facility], [place] [address] [place], [place] [address]   Protime-INR     Status: None   Collection Time: 12/15/17  1:29 PM  Result Value Ref Range   Prothrombin Time 13.1 11.4 - 15.2 seconds   INR 1.00     Comment: Performed at [facility] [address], [place], [place] [address]  APTT     Status: None   Collection Time: 12/15/17  1:29 PM  Result Value Ref Range   aPTT 30 24 - 36 seconds    Comment: Performed at [facility] [address] [place], [place] [address]  CBC     Status: None   Collection Time: 12/15/17  1:29 PM  Result Value Ref Range   WBC 4.9 4.0 - 10.5 K/uL   RBC 4.45 3.87 - 5.11 MIL/uL   Hemoglobin 12.9 12.0 - 15.0 g/dL   HCT 38.9 36.0 - 46.0 %   MCV 87.4 78.0 - 100.0 fL   MCH 29.0 26.0 - 34.0 pg   MCHC 33.2 30.0 - 36.0 g/dL   RDW 14.8 11.5 - 15.5 %   Platelets 202 150 - 400 K/uL    Comment: Performed at [facility] [address], [place], [place] [address]  Differential     Status: None   Collection Time: 12/15/17  1:29 PM  Result Value Ref Range   Neutrophils Relative % 57 %   Neutro Abs 2.7 1.7 - 7.7 K/uL   Lymphocytes Relative 37 %   Lymphs Abs 1.8 0.7 - 4.0 K/uL   Monocytes Relative 5 %   Monocytes Absolute 0.3 0.1 - 1.0 K/uL   Eosinophils Relative 1 %   Eosinophils Absolute 0.1 0.0 - 0.7 K/uL   Basophils  Relative 0 %   Basophils Absolute 0.0 0.0 - 0.1 K/uL    Comment: Performed at [facility] [address], [place], [place] [address]  Comprehensive metabolic panel     Status: Abnormal   Collection Time: 12/15/17  1:29 PM  Result Value Ref Range   Sodium 138 135 - 145 mmol/L   Potassium 4.7 3.5 - 5.1 mmol/L   Chloride 104 101 - 111 mmol/L   CO2 25 22 - 32 mmol/L   Glucose, Bld 260 (H) 65 - 99 mg/dL   BUN 5 (L) 6 - 20 mg/dL   Creatinine, Ser 0.53 0.44 - 1.00 mg/dL   Calcium 9.0 8.9 - 10.3 mg/dL   Total Protein 6.7 6.5 - 8.1 g/dL   Albumin 3.5 3.5 - 5.0 g/dL   AST 17 15 - 41 U/L   ALT 16 14 - 54 U/L   Alkaline Phosphatase 76 38 - 126 U/L   Total Bilirubin 0.6 0.3 - 1.2 mg/dL   GFR calc non Af Amer >60 >60 mL/min   GFR calc Af Amer >60 >60 mL/min    Comment: (NOTE) The eGFR has been calculated using the CKD EPI equation. This calculation has  not been validated in all clinical situations. eGFR's persistently <60 mL/min signify possible Chronic Kidney Disease.    Anion gap 9 5 - 15    Comment: Performed at [facility] [address] [place], [place] [address]      I have reviewed the images obtained: Mr [name] Contrast  Result Date: 12/15/2017 CLINICAL DATA:  Left arm weakness and clumsiness. EXAM: MRI HEAD WITHOUT CONTRAST TECHNIQUE: Multiplanar, multiecho pulse sequences of the brain and surrounding structures were obtained without intravenous contrast. COMPARISON:  None. FINDINGS: Brain: There is a 6 mm acute infarct in the right thalamus. No intracranial hemorrhage, mass, midline shift, or extra-axial fluid collection is present. The ventricles and sulci are normal. Scattered punctate foci of cerebral white matter T2 hyperintensity are slightly prominent for age and nonspecific but compatible with mild chronic small vessel ischemic disease. Vascular: Major intracranial vascular flow voids are preserved. There is fullness of the right MCA bifurcation, and a 3-4  mm aneurysm is questioned. Skull and upper cervical spine: Unremarkable bone marrow signal. Sinuses/Orbits: Unremarkable orbits. Small left mastoid effusion. Clear paranasal sinuses. Other: None. IMPRESSION: 1. Acute right thalamic lacunar infarct. 2. Mild chronic small vessel ischemic disease. 3. 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 Head MRA is recommended for further evaluation. Electronically Signed   By: Logan Bores M.D.   On: 12/15/2017 13:16     Assessment:  47 y.o. female with T2DM, HTN, HLD, and tobacco use who presents to the ED with left face and arm numbness, found to have a 6 mm acute infarct in the right thalamus. 1. Stroke Risk Factors - diabetes mellitus, family history, hyperlipidemia, hypertension and smoking 2. Poor compliance with ASA 3. Discussed the importance with patient and her daughters on risk reduction of future strokes with emphasis on cutting back and eventually cessation of tobacco use. Will need to optimize control of HTN, DM, and HLD. 4. Recommended decreased caffeine intake. She drinks multiple diet Mountain Dews per day 5. Recommend Medical Admission for stroke workup.  Plan: 1. HgbA1c, fasting lipid panel 2. MRA head without contrast 3. PT consult, OT consult, Speech consult 4. Echocardiogram 5. Prophylactic therapy-Antiplatelet med: Aspirin 81 mg daily, High Intensity Statin: Atorvastatin 40 mg daily 6. Carotid Dopplers 7. Risk factor modification 8. Telemetry monitoring 9. Frequent neuro checks 10. She has been counseled on compliance with ASA and its importance for stroke prevention  Zada Finders, MD Internal Medicine PGY-3  I have seen and examined the patient. Assessment and plan discussed with Dr. Zada Finders 12/15/2017, 2:59 PM

## 2017-12-15 NOTE — ED Notes (Addendum)
Patient passed stroke swallow test

## 2017-12-15 NOTE — ED Triage Notes (Signed)
Pt in from home via [facility] with L arm weakness since waking yesterday morning at 0630. Pt states numbness had increased today. Pt able to ambulate and move to stretcher. Hx of HTN and DM, CBG 328. L hand grip strong, no arm drift present.

## 2017-12-15 NOTE — Progress Notes (Signed)
Patient  arrived to the unit Alert and Oriented X 4  Denies Pain.  Bed in lowset position and call light in reach.

## 2017-12-15 NOTE — Progress Notes (Signed)
Patient down in MRI

## 2017-12-15 NOTE — ED Notes (Signed)
Report called to [facility] RN

## 2017-12-15 NOTE — ED Notes (Signed)
Patient transported to X-ray 

## 2017-12-15 NOTE — ED Notes (Signed)
Patient transported to MRI 

## 2017-12-15 NOTE — H&P (Addendum)
History and Physical  Misty Mccullough UJW:[id number] DOB: Mar 06, 1971 DOA: 12/15/2017  Referring physician: Dr Rush Landmark PCP: Bing Neighbors, FNP  Outpatient Specialists: None Patient coming from: Home  Chief Complaint: "Left face and left arm numbness for one day"  HPI: Misty Mccullough is a 47 y.o. female with medical history significant for HTN, HLD, DM2, current tobacco use, DJD who presents to the ED at [facility] with complaints of left sided facial and arm numbness. Reports she woke up with these symptoms yesterday morning 12/14/17.  She thought they were caused by a pinched nerve. Because symptoms persist, she called her PCP this morning who recommended to come to the ED. Denies headache, change in vision, chest pain, or palpitations. Starting to regain feeling in her face and left arm. Admits to family history of stroke in her father and paternal aunt. On aspirin and statin at home.  ED Course: MRI positive for acute CVA involving right thalamus with questionable small right MCA bifurcation aneurysm. Head MRA recommended. MRI reviewed by self and agree with preliminary report. Vital signs stable. EKG as read by self revealed SR  Rate 82, diffused ST depression involving leads V4, V5, V6, leads II,III, AVF.  Review of Systems:  As stated in the HPI. All other systems reviewed and are negative.   Past Medical History:  Diagnosis Date  . Diabetes mellitus without complication ([facility])   . Gallstones   . Heavy cigarette smoker   . Hypercholesteremia   . Hypertension    Past Surgical History:  Procedure Laterality Date  . TUBAL LIGATION      Social History:  reports that she has been smoking.  She has been smoking about 1.50 packs per day. she has never used smokeless tobacco. She reports that she drinks alcohol. She reports that she does not use drugs. States she drinks moonshine when she is sick or during special occasions. Last intake was at her [birth year] in february 2020.  No  Known Allergies  Family History  Problem Relation Age of Onset  . Diabetes Mother   . Uterine cancer Mother   . Diabetes Father   . Heart attack Father 41  . Stroke Father   . Heart attack Paternal Grandmother   . Heart attack Paternal Grandfather   . Stroke Sister   . Heart attack Paternal Uncle   . Bone cancer Paternal Uncle     Father and paternal aunt had strokes.  Prior to Admission medications   Medication Sig Start Date End Date Taking? Authorizing Provider  aspirin EC 81 MG tablet Take 1 tablet (81 mg total) by mouth daily. 12/06/16  Yes Massie Maroon, FNP  Aspirin-Acetaminophen-Caffeine (GOODY HEADACHE PO) Take 1 packet by mouth daily as needed (headache).   Yes [provider]  DM-Doxylamine-Acetaminophen (NYQUIL COLD & FLU PO) Take 1 capsule by mouth at bedtime as needed (pain/sleep).   Yes [provider]  lisinopril (PRINIVIL,ZESTRIL) 20 MG tablet Take 2 tablets (40 mg total) by mouth daily. 12/03/17  Yes Bing Neighbors, FNP  metFORMIN (GLUCOPHAGE) 500 MG tablet Take 1 tablet (500 mg total) by mouth 2 (two) times daily with a meal. 12/03/17  Yes Bing Neighbors, FNP  diclofenac sodium (VOLTAREN) 1 % GEL Apply 4 g topically 4 (four) times daily. Patient not taking: Reported on 12/15/2017 10/07/16   Pisciotta, Joni Reining, PA-C  dicyclomine (BENTYL) 10 MG capsule Take 1 capsule (10 mg total) by mouth 3 (three) times daily before meals. Patient not taking:  Reported on 12/15/2017 02/04/17   Bing NeighborsHarris, Kimberly S, FNP  feeding supplement, ENSURE ENLIVE, (ENSURE ENLIVE) LIQD Take 237 mLs by mouth 2 (two) times daily between meals. Patient not taking: Reported on 12/15/2017 08/14/16   Regalado, Jon BillingsBelkys A, MD  fluticasone (FLONASE) 50 MCG/ACT nasal spray Place 2 sprays into both nostrils daily. Patient not taking: Reported on 12/15/2017 09/06/16   Henrietta HooverBernhardt, Linda C, NP  gabapentin (NEURONTIN) 300 MG capsule Take 1 capsule (300 mg total) by mouth 3 (three) times  daily. Patient not taking: Reported on 12/15/2017 12/06/16   Massie MaroonHollis, Lachina M, FNP  methocarbamol (ROBAXIN) 500 MG tablet Can take up to 1 tabs every 6 hours PRN PAIN Patient not taking: Reported on 12/15/2017 12/11/16   Massie MaroonHollis, Lachina M, FNP  omeprazole (PRILOSEC) 20 MG capsule Take 1 capsule (20 mg total) by mouth 2 (two) times daily with a meal. Patient not taking: Reported on 12/15/2017 07/10/17   Bing NeighborsHarris, Kimberly S, FNP  pravastatin (PRAVACHOL) 20 MG tablet Take 1 tablet (20 mg total) by mouth daily at 6 PM. Patient not taking: Reported on 12/15/2017 12/06/16   Massie MaroonHollis, Lachina M, FNP  traMADol (ULTRAM) 50 MG tablet Take 1-2 tablets (50-100 mg total) by mouth every 8 (eight) hours as needed. Patient not taking: Reported on 12/15/2017 04/08/17   Bing NeighborsHarris, Kimberly S, FNP    Physical Exam: BP (!) 148/78   Pulse 71   Temp 98 F (36.7 C)   Resp 15   Ht 5\' 4"  (1.626 m)   Wt 61.2 kg (135 lb)   LMP 12/11/2017   SpO2 97%   BMI 23.17 kg/m   General:  47 yo CF WD WN NAD A&O x 3. No aphasia or dysarthria noted.  Eyes: Pupils are round and reactive to light  ENT: Mucosae has no erythema or exudates Neck: No JVD, no carotid bruits appreciated  Cardiovascular: RRR no rubs or gallops Respiratory: CTA no wheezes or rales  Abdomen: Soft NT ND NBS X4 Skin: No rash  Musculoskeletal: NO peripheral edema, 2/4 pulses in all 4 extremities Psychiatric: Mood is appropriate for condition and setting  Neurologic:  Mild weakness noted in left lower extremity 4/5. Sensation is intact throughout. No dysarthria or expressive aphasia noted.           Labs on Admission:  Basic Metabolic Panel: Recent Labs  Lab 12/15/17 1201 12/15/17 1329  NA 138 138  K 4.2 4.7  CL 103 104  CO2  --  25  GLUCOSE 280* 260*  BUN 5* 5*  CREATININE 0.40* 0.53  CALCIUM  --  9.0   Liver Function Tests: Recent Labs  Lab 12/15/17 1329  AST 17  ALT 16  ALKPHOS 76  BILITOT 0.6  PROT 6.7  ALBUMIN 3.5   No results for  input(s): LIPASE, AMYLASE in the last 168 hours. No results for input(s): AMMONIA in the last 168 hours. CBC: Recent Labs  Lab 12/15/17 1201 12/15/17 1329  WBC  --  4.9  NEUTROABS  --  2.7  HGB 12.9 12.9  HCT 38.0 38.9  MCV  --  87.4  PLT  --  202   Cardiac Enzymes: No results for input(s): CKTOTAL, CKMB, CKMBINDEX, TROPONINI in the last 168 hours.  BNP (last 3 results) No results for input(s): BNP in the last 8760 hours.  ProBNP (last 3 results) No results for input(s): PROBNP in the last 8760 hours.  CBG: No results for input(s): GLUCAP in the last 168 hours.  Radiological  Exams on Admission: Dg Chest 2 View  Result Date: 12/15/2017 CLINICAL DATA:  47 year old female with cough for several weeks. Left arm and neck numbness since yesterday. Smoker. EXAM: CHEST - 2 VIEW COMPARISON:  Chest radiographs and thoracic spine MRI 09/30/2016, and earlier FINDINGS: Lung volumes remain normal. Mediastinal contours remain normal. Visualized tracheal air column is within normal limits. No pneumothorax, pulmonary edema, pleural effusion or confluent pulmonary opacity. Chronic mild increased pulmonary interstitial opacity is stable. Stable visualized osseous structures. Negative visible bowel gas pattern. IMPRESSION: No acute cardiopulmonary abnormality. Electronically Signed   By: Odessa Fleming M.D.   On: 12/15/2017 14:54   Mr [name] Contrast  Result Date: 12/15/2017 CLINICAL DATA:  Left arm weakness and clumsiness. EXAM: MRI HEAD WITHOUT CONTRAST TECHNIQUE: Multiplanar, multiecho pulse sequences of the brain and surrounding structures were obtained without intravenous contrast. COMPARISON:  None. FINDINGS: Brain: There is a 6 mm acute infarct in the right thalamus. No intracranial hemorrhage, mass, midline shift, or extra-axial fluid collection is present. The ventricles and sulci are normal. Scattered punctate foci of cerebral white matter T2 hyperintensity are slightly prominent for age and  nonspecific but compatible with mild chronic small vessel ischemic disease. Vascular: Major intracranial vascular flow voids are preserved. There is fullness of the right MCA bifurcation, and a 3-4 mm aneurysm is questioned. Skull and upper cervical spine: Unremarkable bone marrow signal. Sinuses/Orbits: Unremarkable orbits. Small left mastoid effusion. Clear paranasal sinuses. Other: None. IMPRESSION: 1. Acute right thalamic lacunar infarct. 2. Mild chronic small vessel ischemic disease. 3. 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 Head MRA is recommended for further evaluation. Electronically Signed   By: Sebastian Ache M.D.   On: 12/15/2017 13:16    EKG: Independently reviewed. 12/15/17 12 leads EKG as reviewed by self revealed SR  Rate 82, diffused ST depression involving leads V4, V5, V6, leads II,III, AVF.  Assessment/Plan Present on Admission: . Acute CVA (cerebrovascular accident) [facility])  Active Problems:   Acute CVA (cerebrovascular accident) ([facility])  Acute right thalamic lacunar infarct -Symptoms started more than 24 hours ago -MRI brain 12/15/17 revealed acute right thalamic lacunar infarct -Admitted for CVA work up -2D echo, telemetry monitoring to r/o arrythmia -obtain lipid panel, A1C, carotid duplex U/S -start asa, high dose lipitor -permissive HTN 24-48 hr from time of symptoms onset -speech therapist eval, PT/OT eval -fall precaution, aspiration precaution -Neurohospitalist following  Abnormal EKG possibly 2/2 to acute cva -12 leads EKG 12/15/17 revealed SR  Rate 82, diffused ST depression involving leads V4, V5, V6, leads II,III, AVF. -Denies chest pain -1 set troponin pending  Uncontrolled type 2 diabetes with hyperglycemia and newly diagnosed stroke -Obtain A1C -last HgA1C 6.4 (12/06/16) -A1C goal less than 7.0 -ISS  -diabetic diet if swallows safely  HLD -Last ldl 140 (12/06/16) -ldl goal less than 70 -continue high dose statin  HTN -permissive HTN  allowed -treat is SBP>220 or dBP>120 -IV labetalol prn -permissive HTN 24-48 hr after onset of symptoms -on 20 mg lisinopril at home, hold for now -resume antihypertensive meds tomorrow 12-16-17  Tobacco use disorder -Tobacco cessation counseling at bedside  Diabetes polyneuropathy -continue gabapentin  GERD -continue omeprazole  Alcohol use/moonshine -Alcohol withdrawal protocol -states she drinks occasionally and when she is sick -UDS pending  Suspect ambulatory dysfunction post stroke -PT/OT evaluate -fall precaution   DVT prophylaxis: SCDs  Code Status: Full   Family Communication: Daughter at bedside   Disposition Plan: Will be admitted for 2 midnights to complete stroke workup  and treat presenting symptoms. Possible home with home heath PT or outpatient PT/OT after 2 midnights-Defer to Physical therapist and OT for placement recommendation.   Consults called: Neurology-stroke team  Admission status: Inpatient, minimum 2 midnights for stroke workup and treatment of presenting symptoms.    Darlin Drop MD [facility] Pager [phone]  If 7PM-7AM, please contact night-coverage [email or web address] Password [facility]  12/15/2017, 5:36 PM

## 2017-12-15 NOTE — ED Notes (Addendum)
Patient returned to room from MRI

## 2017-12-15 NOTE — ED Provider Notes (Signed)
[facility] EMERGENCY DEPARTMENT Provider Note   CSN: [id number] Arrival date & time: 12/15/17  1046     History   Chief Complaint Chief Complaint  Patient presents with  . L arm weakness    HPI Misty Mccullough is a 47 y.o. female.  The history is provided by the patient, a relative and medical records.  Neurologic Problem  This is a new problem. The current episode started yesterday. The problem occurs constantly. The problem has not changed since onset.Pertinent negatives include no chest pain, no abdominal pain, no headaches and no shortness of breath. Nothing aggravates the symptoms. Nothing relieves the symptoms. The treatment provided no relief.    Past Medical History:  Diagnosis Date  . Diabetes mellitus without complication (HCC)   . Gallstones   . Heavy cigarette smoker   . Hypercholesteremia   . Hypertension     Patient Active Problem List   Diagnosis Date Noted  . Malnutrition of moderate degree 08/14/2016  . Chest pain 08/13/2016  . Back pain 08/13/2016  . Essential hypertension 08/13/2016  . HLD (hyperlipidemia) 08/13/2016  . Diabetes mellitus with complication (HCC) 08/13/2016  . Chest pain, exertional     Past Surgical History:  Procedure Laterality Date  . TUBAL LIGATION      OB History    No data available       Home Medications    Prior to Admission medications   Medication Sig Start Date End Date Taking? Authorizing Provider  aspirin EC 81 MG tablet Take 1 tablet (81 mg total) by mouth daily. 12/06/16   Massie Maroon, FNP  diclofenac sodium (VOLTAREN) 1 % GEL Apply 4 g topically 4 (four) times daily. 10/07/16   Pisciotta, Joni Reining, PA-C  dicyclomine (BENTYL) 10 MG capsule Take 1 capsule (10 mg total) by mouth 3 (three) times daily before meals. 02/04/17   Bing Neighbors, FNP  feeding supplement, ENSURE ENLIVE, (ENSURE ENLIVE) LIQD Take 237 mLs by mouth 2 (two) times daily between meals. 08/14/16   Regalado, Belkys A, MD    fluticasone (FLONASE) 50 MCG/ACT nasal spray Place 2 sprays into both nostrils daily. 09/06/16   Henrietta Hoover, NP  gabapentin (NEURONTIN) 300 MG capsule Take 1 capsule (300 mg total) by mouth 3 (three) times daily. 12/06/16   Massie Maroon, FNP  lisinopril (PRINIVIL,ZESTRIL) 20 MG tablet Take 2 tablets (40 mg total) by mouth daily. 12/03/17   Bing Neighbors, FNP  metFORMIN (GLUCOPHAGE) 500 MG tablet Take 1 tablet (500 mg total) by mouth 2 (two) times daily with a meal. 12/03/17   Bing Neighbors, FNP  methocarbamol (ROBAXIN) 500 MG tablet Can take up to 1 tabs every 6 hours PRN PAIN 12/11/16   Massie Maroon, FNP  Multiple Vitamin (MULTIVITAMIN WITH MINERALS) TABS tablet Take 1 tablet by mouth daily.    [provider]  omeprazole (PRILOSEC) 20 MG capsule Take 1 capsule (20 mg total) by mouth 2 (two) times daily with a meal. 07/10/17   Bing Neighbors, FNP  pravastatin (PRAVACHOL) 20 MG tablet Take 1 tablet (20 mg total) by mouth daily at 6 PM. 12/06/16   Massie Maroon, FNP  traMADol (ULTRAM) 50 MG tablet Take 1-2 tablets (50-100 mg total) by mouth every 8 (eight) hours as needed. 04/08/17   Bing Neighbors, FNP    Family History Family History  Problem Relation Age of Onset  . Diabetes Mother   . Uterine cancer Mother   .  Diabetes Father   . Heart attack Father [id number]  . Stroke Father   . Heart attack Paternal Grandmother   . Heart attack Paternal Grandfather     Social History Social History   Tobacco Use  . Smoking status: Current Every Day Smoker    Packs/day: 1.00  . Smokeless tobacco: Never Used  Substance Use Topics  . Alcohol use: No  . Drug use: No     Allergies   Patient has no known allergies.   Review of Systems Review of Systems  Constitutional: Negative for chills, diaphoresis and fatigue.  HENT: Negative for congestion.   Eyes: Negative for visual disturbance.  Respiratory: Negative for cough, chest tightness, shortness of breath,  wheezing and stridor.   Cardiovascular: Negative for chest pain and palpitations.  Gastrointestinal: Negative for abdominal pain.  Genitourinary: Negative for flank pain.  Musculoskeletal: Negative for back pain and neck pain.  Skin: Negative for rash and wound.  Neurological: Positive for weakness (subjective) and numbness. Negative for dizziness, seizures, light-headedness and headaches.  Psychiatric/Behavioral: Negative for agitation.  All other systems reviewed and are negative.    Physical Exam Updated Vital Signs BP 138/71 (BP Location: Right Arm)   Pulse 79   Temp 98 F (36.7 C) (Oral)   Resp 19   Ht 5\' 4"  (1.626 m)   Wt 61.2 kg (135 lb)   LMP 12/11/2017   SpO2 98%   BMI 23.17 kg/m   Physical Exam  Constitutional: She is oriented to person, place, and time. She appears well-developed and well-nourished. No distress.  HENT:  Head: Normocephalic and atraumatic.  Mouth/Throat: Oropharynx is clear and moist. No oropharyngeal exudate.  Eyes: Conjunctivae and EOM are normal. Pupils are equal, round, and reactive to light.  Neck: Normal range of motion. Neck supple. No JVD present.  Cardiovascular: Normal rate and regular rhythm.  No murmur heard. Pulmonary/Chest: Effort normal and breath sounds normal. No stridor. No respiratory distress. She has no wheezes. She exhibits no tenderness.  Abdominal: Soft. There is no tenderness.  Musculoskeletal: She exhibits no edema.  Neurological: She is alert and oriented to person, place, and time. She is not disoriented. She displays no tremor. A sensory deficit is present. No cranial nerve deficit. She exhibits normal muscle tone. Coordination normal. GCS eye subscore is 4. GCS verbal subscore is 5. GCS motor subscore is 6.  Abnormal sensation left face and left arm.  No coordination of normality.  No facial droop.  Normal speech.  Normal extraocular movements and normal pupils.  No strength abnormalities seen on my exam of the upper  extremities.  No lower extremity N normality seen.  Skin: Skin is warm and dry. No rash noted. No erythema.  Psychiatric: She has a normal mood and affect.  Nursing note and vitals reviewed.    ED Treatments / Results  Labs (all labs ordered are listed, but only abnormal results are displayed) Labs Reviewed  COMPREHENSIVE METABOLIC PANEL - Abnormal; Notable for the following components:      Result Value   Glucose, Bld 260 (*)    BUN 5 (*)    All other components within normal limits  I-STAT CHEM 8, ED - Abnormal; Notable for the following components:   BUN 5 (*)    Creatinine, Ser 0.40 (*)    Glucose, Bld 280 (*)    Calcium, Ion 1.12 (*)    All other components within normal limits  ETHANOL  PROTIME-INR  APTT  CBC  DIFFERENTIAL  RAPID URINE DRUG SCREEN, HOSP PERFORMED  URINALYSIS, ROUTINE W REFLEX MICROSCOPIC  HEMOGLOBIN A1C  I-STAT TROPONIN, ED  I-STAT BETA HCG BLOOD, ED ([facility], [facility], [facility] ONLY)  POC URINE PREG, ED    EKG  EKG Interpretation  Date/Time:  Monday December 15 2017 10:53:17 EDT Ventricular Rate:  82 PR Interval:    QRS Duration: 104 QT Interval:  389 QTC Calculation: 455 R Axis:   62 Text Interpretation:  Sinus rhythm RSR' in V1 or V2, right VCD or RVH Probable anteroseptal infarct, old Repol abnrm suggests ischemia, lateral leads When compared to prior, no significant changes seen.  No STEMI Confirmed by Theda Belfast ([id number]) on 12/15/2017 11:35:56 AM       Radiology Dg Chest 2 View  Result Date: 12/15/2017 CLINICAL DATA:  47 year old female with cough for several weeks. Left arm and neck numbness since yesterday. Smoker. EXAM: CHEST - 2 VIEW COMPARISON:  Chest radiographs and thoracic spine MRI 09/30/2016, and earlier FINDINGS: Lung volumes remain normal. Mediastinal contours remain normal. Visualized tracheal air column is within normal limits. No pneumothorax, pulmonary edema, pleural effusion or confluent pulmonary opacity. Chronic mild increased  pulmonary interstitial opacity is stable. Stable visualized osseous structures. Negative visible bowel gas pattern. IMPRESSION: No acute cardiopulmonary abnormality. Electronically Signed   By: Odessa Fleming M.D.   On: 12/15/2017 14:54   Mr [name] Contrast  Result Date: 12/15/2017 CLINICAL DATA:  Left arm weakness and clumsiness. EXAM: MRI HEAD WITHOUT CONTRAST TECHNIQUE: Multiplanar, multiecho pulse sequences of the brain and surrounding structures were obtained without intravenous contrast. COMPARISON:  None. FINDINGS: Brain: There is a 6 mm acute infarct in the right thalamus. No intracranial hemorrhage, mass, midline shift, or extra-axial fluid collection is present. The ventricles and sulci are normal. Scattered punctate foci of cerebral white matter T2 hyperintensity are slightly prominent for age and nonspecific but compatible with mild chronic small vessel ischemic disease. Vascular: Major intracranial vascular flow voids are preserved. There is fullness of the right MCA bifurcation, and a 3-4 mm aneurysm is questioned. Skull and upper cervical spine: Unremarkable bone marrow signal. Sinuses/Orbits: Unremarkable orbits. Small left mastoid effusion. Clear paranasal sinuses. Other: None. IMPRESSION: 1. Acute right thalamic lacunar infarct. 2. Mild chronic small vessel ischemic disease. 3. 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 Head MRA is recommended for further evaluation. Electronically Signed   By: Sebastian Ache M.D.   On: 12/15/2017 13:16    Procedures Procedures (including critical care time)  Medications Ordered in ED Medications  aspirin EC tablet 81 mg (not administered)  atorvastatin (LIPITOR) tablet 40 mg (not administered)     Initial Impression / Assessment and Plan / ED Course  I have reviewed the triage vital signs and the nursing notes.  Pertinent labs & imaging results that were available during my care of the patient were reviewed by me and considered in my medical  decision making (see chart for details).     Rayanna Matusik is a 46 y.o. female with past medical history significant for hypertension, hyperlipidemia, and diabetes who presents with neurologic deficit.  Patient reports that her last normal was 2 days ago when she woke up yesterday morning with left face and left arm numbness.  She also reports some weakness.  Patient says that she has had no difficulty speaking, headaches, vision changes, or other complaints.  She denies any difficulty with ambulation.  She reports that she has never had strokelike symptoms in the past.  She denies any recent medication  changes.  She denies any trauma.  She says that her last normal was Saturday night before going to bed and waking up with symptoms yesterday.  On exam, patient has a tingling numbness sensation in her left face and left arm.  Patient did not have any abnormalities with grip strength or coordination on my exam.  No evidence of facial droop.  Symmetric palate elevation and she had clear speech.  Patient had normal extraocular movements and normal pupil exam.  Patient had no tenderness on exam.  Patient can move legs normally.  Normal finger-nose-finger testing.  Lungs clear and chest nontender.  Given the concern for unilateral numbness and the reported subjective weakness, MRI was ordered to further evaluate.  MRI shows evidence of a right sided thalamic stroke.  Neurology will call for management recommendations.  Other laboratory testing EKG and chest x-ray will also be obtained.  Patient reported that she had a cough recently.       Neurology recommends admission to hospital service for further management of her new stroke.  Chest x-ray shows no acute ab normality and no pneumonia.    Patient will be admitted for further management.    Final Clinical Impressions(s) / ED Diagnoses   Final diagnoses:  Cerebrovascular accident (CVA), unspecified mechanism (HCC)  Numbness     Clinical  Impression: 1. Cerebrovascular accident (CVA), unspecified mechanism (HCC)   2. Numbness     Disposition: Admit  This note was prepared with assistance of Dragon voice recognition software. Occasional wrong-word or sound-a-like substitutions may have occurred due to the inherent limitations of voice recognition software.      Ema Hebner, Canary Brim, MD 12/15/17 [phone]

## 2017-12-15 NOTE — Telephone Encounter (Signed)
Patient was advise to go to the ED for numbness on her left side since yesterday which seems to be going in her face. Patient was advise to call ems if she doesn't have a ride

## 2017-12-15 NOTE — ED Notes (Signed)
Pt states arm tingling is "getting better- still has tingling from left ear to middle of backside of head"

## 2017-12-16 ENCOUNTER — Inpatient Hospital Stay (HOSPITAL_COMMUNITY): Payer: Self-pay

## 2017-12-16 ENCOUNTER — Ambulatory Visit: Payer: Self-pay | Admitting: Family Medicine

## 2017-12-16 DIAGNOSIS — E785 Hyperlipidemia, unspecified: Secondary | ICD-10-CM

## 2017-12-16 DIAGNOSIS — R200 Anesthesia of skin: Secondary | ICD-10-CM

## 2017-12-16 DIAGNOSIS — I10 Essential (primary) hypertension: Secondary | ICD-10-CM

## 2017-12-16 DIAGNOSIS — E118 Type 2 diabetes mellitus with unspecified complications: Secondary | ICD-10-CM

## 2017-12-16 DIAGNOSIS — Z720 Tobacco use: Secondary | ICD-10-CM | POA: Diagnosis present

## 2017-12-16 DIAGNOSIS — I639 Cerebral infarction, unspecified: Secondary | ICD-10-CM

## 2017-12-16 DIAGNOSIS — I5030 Unspecified diastolic (congestive) heart failure: Secondary | ICD-10-CM

## 2017-12-16 LAB — GLUCOSE, CAPILLARY
GLUCOSE-CAPILLARY: 328 mg/dL — AB (ref 65–99)
GLUCOSE-CAPILLARY: 249 mg/dL — AB (ref 65–99)
Glucose-Capillary: 236 mg/dL — ABNORMAL HIGH (ref 65–99)

## 2017-12-16 LAB — CBC
HCT: 37.7 % (ref 36.0–46.0)
Hemoglobin: 12.6 g/dL (ref 12.0–15.0)
MCH: 29.1 pg (ref 26.0–34.0)
MCHC: 33.4 g/dL (ref 30.0–36.0)
MCV: 87.1 fL (ref 78.0–100.0)
PLATELETS: 186 10*3/uL (ref 150–400)
RBC: 4.33 MIL/uL (ref 3.87–5.11)
RDW: 14.6 % (ref 11.5–15.5)
WBC: 4.5 10*3/uL (ref 4.0–10.5)

## 2017-12-16 LAB — ECHOCARDIOGRAM COMPLETE
HEIGHTINCHES: 64 in
Weight: 2160 oz

## 2017-12-16 LAB — COMPREHENSIVE METABOLIC PANEL
ALBUMIN: 3.2 g/dL — AB (ref 3.5–5.0)
ALT: 15 U/L (ref 14–54)
ANION GAP: 7 (ref 5–15)
AST: 17 U/L (ref 15–41)
Alkaline Phosphatase: 76 U/L (ref 38–126)
BILIRUBIN TOTAL: 0.6 mg/dL (ref 0.3–1.2)
BUN: 9 mg/dL (ref 6–20)
CHLORIDE: 105 mmol/L (ref 101–111)
CO2: 24 mmol/L (ref 22–32)
Calcium: 8.7 mg/dL — ABNORMAL LOW (ref 8.9–10.3)
Creatinine, Ser: 0.48 mg/dL (ref 0.44–1.00)
GFR calc Af Amer: >60 mL/min (ref >60)
GFR calc non Af Amer: >60 mL/min (ref >60)
GLUCOSE: 236 mg/dL — AB (ref 65–99)
POTASSIUM: 3.7 mmol/L (ref 3.5–5.1)
SODIUM: 136 mmol/L (ref 135–145)
TOTAL PROTEIN: 6.5 g/dL (ref 6.5–8.1)

## 2017-12-16 LAB — LIPID PANEL
Cholesterol: 181 mg/dL (ref 0–200)
HDL: 31 mg/dL — AB (ref >40)
LDL CALC: 133 mg/dL — AB (ref 0–99)
Total CHOL/HDL Ratio: 5.8 RATIO
Triglycerides: 86 mg/dL (ref <150)
VLDL: 17 mg/dL (ref 0–40)

## 2017-12-16 MED ORDER — ATORVASTATIN CALCIUM 80 MG PO TABS: 80.0000 mg | ORAL_TABLET | Freq: Every day | ORAL | 0 refills | Status: DC

## 2017-12-16 MED ORDER — INSULIN NPH ISOPHANE & REGULAR (70-30) 100 UNIT/ML ~~LOC~~ SUSP: SUBCUTANEOUS | 3 refills | Status: DC

## 2017-12-16 MED ORDER — INSULIN STARTER KIT- SYRINGES (ENGLISH)
1.0000 | Freq: Once | Status: AC
  Administered 2017-12-16: 1
  Filled 2017-12-16: qty 1

## 2017-12-16 MED ORDER — FOLIC ACID 1 MG PO TABS: 1.0000 mg | ORAL_TABLET | Freq: Every day | ORAL | 0 refills | Status: DC

## 2017-12-16 MED ORDER — NICOTINE 21 MG/24HR TD PT24
21.0000 mg | MEDICATED_PATCH | Freq: Every day | TRANSDERMAL | Status: DC
  Administered 2017-12-16: 21 mg via TRANSDERMAL
  Filled 2017-12-16: qty 1

## 2017-12-16 MED ORDER — ASPIRIN 325 MG PO TBEC: 325.0000 mg | DELAYED_RELEASE_TABLET | Freq: Every day | ORAL | 0 refills | Status: DC

## 2017-12-16 MED ORDER — ADULT MULTIVITAMIN W/MINERALS CH: 1.0000 | ORAL_TABLET | Freq: Every day | ORAL | 0 refills | Status: DC

## 2017-12-16 MED ORDER — INSULIN STARTER KIT- SYRINGES (ENGLISH): 1.0000 | Freq: Once | 0 refills | Status: AC

## 2017-12-16 MED ORDER — INSULIN GLARGINE 100 UNIT/ML ~~LOC~~ SOLN
10.0000 [IU] | Freq: Every day | SUBCUTANEOUS | Status: DC
  Administered 2017-12-16: 10 [IU] via SUBCUTANEOUS
  Filled 2017-12-16: qty 0.1

## 2017-12-16 MED ORDER — ASPIRIN EC 325 MG PO TBEC
325.0000 mg | DELAYED_RELEASE_TABLET | Freq: Every day | ORAL | Status: DC
  Administered 2017-12-16: 325 mg via ORAL
  Filled 2017-12-16: qty 1

## 2017-12-16 MED ORDER — THIAMINE HCL 100 MG PO TABS: 100.0000 mg | ORAL_TABLET | Freq: Every day | ORAL | 0 refills | Status: DC

## 2017-12-16 NOTE — Discharge Instructions (Signed)
New Medications  Aspirin 325 mg daily ( big aspirin)  Insulin ( Novolin) 6 units in morning before breakfast every day and 6 units in evening after dinner day.  Atorvastatin 80 mg daily

## 2017-12-16 NOTE — Evaluation (Signed)
Physical Therapy Evaluation Patient Details Name: Misty Mccullough MRN: [id number] DOB: [birth year] Today's Date: 12/16/2017   History of Present Illness  Patient is a 47 y/o female who presents with left sided weakness/numbness and facial numbness. MRI- right thalamic infarct with questionable R MCA aneurysm. PMH includes HTN, DM.  Clinical Impression  Patient presents with mild numbness in LUE and face which has improved since admission. Tolerated gait training and stair training without difficulty. Scored 21/24 on DGI indicating not a risk for falls. Pt independent PTA and lives with spouse and daughter. Education re: signs/symptoms of stroke and the importance of BeFAST and time. Pt verbalizes understanding. Pt does not require skilled therapy services as pt functioning close to baseline. All education completed. Discharge from therapy.     Follow Up Recommendations No PT follow up    Equipment Recommendations  None recommended by PT    Recommendations for Other Services       Precautions / Restrictions Precautions Precautions: None Restrictions Weight Bearing Restrictions: No      Mobility  Bed Mobility Overal bed mobility: Modified Independent             General bed mobility comments: HOB elevated.  Transfers Overall transfer level: Modified independent Equipment used: None             General transfer comment: Stood without difficulty, no dizziness.  Ambulation/Gait Ambulation/Gait assistance: Supervision Ambulation Distance (Feet): 300 Feet Assistive device: None Gait Pattern/deviations: Step-through pattern   Gait velocity interpretation: at or above normal speed for age/gender General Gait Details: Steady gait. Tolerated higher level balance activities without difficulty. See balance section.  Stairs Stairs: Yes Stairs assistance: Modified independent (Device/Increase time) Stair Management: One rail Left;Alternating pattern Number of Stairs:  13 General stair comments: Good technique, use of rail.  Wheelchair Mobility    Modified Rankin (Stroke Patients Only) Modified Rankin (Stroke Patients Only) Pre-Morbid Rankin Score: No significant disability Modified Rankin: No significant disability     Balance Overall balance assessment: Needs assistance Sitting-balance support: Feet supported;No upper extremity supported Sitting balance-Leahy Scale: Good     Standing balance support: During functional activity Standing balance-Leahy Scale: Good               High level balance activites: Backward walking;Direction changes;Turns;Sudden stops;Head turns High Level Balance Comments: Tolerated above with no deviations in gait.  Standardized Balance Assessment Standardized Balance Assessment : Dynamic Gait Index   Dynamic Gait Index Level Surface: Normal Change in Gait Speed: Normal Gait with Horizontal Head Turns: Mild Impairment Gait with Vertical Head Turns: Mild Impairment Gait and Pivot Turn: Normal Step Over Obstacle: Normal Step Around Obstacles: Normal Steps: Mild Impairment Total Score: 21       Pertinent Vitals/Pain Pain Assessment: No/denies pain    Home Living Family/patient expects to be discharged to:: Private residence Living Arrangements: Spouse/significant other;Children Available Help at Discharge: Family;Available PRN/intermittently Type of Home: House Home Access: Stairs to enter Entrance Stairs-Rails: Right Entrance Stairs-Number of Steps: 3 Home Layout: Laundry or work area in basement;Two level Home Equipment: None      Prior Function Level of Independence: Independent         Comments: Does IADLs.     Hand Dominance   Dominant Hand: Right    Extremity/Trunk Assessment   Upper Extremity Assessment Upper Extremity Assessment: Defer to OT evaluation;LUE deficits/detail LUE Sensation: decreased light touch    Lower Extremity Assessment Lower Extremity Assessment:  Overall WFL for tasks assessed  Cervical / Trunk Assessment Cervical / Trunk Assessment: Normal  Communication   Communication: No difficulties  Cognition Arousal/Alertness: Awake/alert Behavior During Therapy: WFL for tasks assessed/performed Overall Cognitive Status: Within Functional Limits for tasks assessed                                        General Comments      Exercises     Assessment/Plan    PT Assessment Patent does not need any further PT services  PT Problem List         PT Treatment Interventions      PT Goals (Current goals can be found in the Care Plan section)  Acute Rehab PT Goals Patient Stated Goal: to go home PT Goal Formulation: All assessment and education complete, DC therapy    Frequency     Barriers to discharge        Co-evaluation               AM-PAC PT "6 Clicks" Daily Activity  Outcome Measure Difficulty turning over in bed (including adjusting bedclothes, sheets and blankets)?: None Difficulty moving from lying on back to sitting on the side of the bed? : None Difficulty sitting down on and standing up from a chair with arms (e.g., wheelchair, bedside commode, etc,.)?: None Help needed moving to and from a bed to chair (including a wheelchair)?: None Help needed walking in hospital room?: None Help needed climbing 3-5 steps with a railing? : None 6 Click Score: 24    End of Session Equipment Utilized During Treatment: Gait belt Activity Tolerance: Patient tolerated treatment well Patient left: in bed;with call bell/phone within reach;with family/visitor present Nurse Communication: Mobility status PT Visit Diagnosis: Difficulty in walking, not elsewhere classified (R26.2)    Time: [id number] PT Time Calculation (min) (ACUTE ONLY): 17 min   Charges:   PT Evaluation $PT Eval Low Complexity: 1 Low     PT G CodesMylo Mccullough:        Joanthan Hlavacek, PT, DPT [phone]    Blake DivineShauna A  Cay Kath 12/16/2017, 10:15 AM

## 2017-12-16 NOTE — Discharge Summary (Signed)
Discharge Summary  Misty Mccullough WUG:[id number] DOB: [birth year]  PCP: Scot Jun, FNP  Admit date: 12/15/2017 Discharge date: 12/16/2017  Time spent: < 25 minutes  Admitted From: Home Disposition: Home  Recommendations for Outpatient Follow-up:  1. Follow up with PCP in 1-2 weeks 2. New Medications: aspirin 325 mg, atorvastatin 52m, Novolin 70/30 6 U BID 3. 4-6 wk follow up with D. PRoyal Hawthorn(Neurology)  Home Health:NO Equipment/Devices:None  Discharge Diagnoses:  Active Hospital Problems   Diagnosis Date Noted  . Tobacco use 12/16/2017  . Acute [place] ([place]) ([place] 12/15/2017  . Diabetes mellitus with complication ([facility] 188/82/8003 . Essential hypertension 08/13/2016  . HLD (hyperlipidemia) 08/13/2016    Resolved Hospital Problems  No resolved problems to display.    Discharge Condition: Stable CODE STATUS:Full Diet recommendation: Carb Modified   Vitals:   12/16/17 1248 12/16/17 1625  BP: (!) 154/59 (!) 156/89  Pulse: 74 72  Resp: 15 17  Temp: 98.2 F (36.8 C) 98.4 F (36.9 C)  SpO2: 99% 99%    History of present illness:  Misty Nicholsis a 47y.o. year old female with medical history significant for hypertension, hyperlipidemia, type 2 diabetes, current tobacco use, degenerative joint disease who presented on 12/15/2017 with 2 days of left sided face and arm numbness and was found to have acute right thalamic lacunar infarct. Hospital course addressed below in problem based format:    Hospital Course:  Active Problems:   Essential hypertension   HLD (hyperlipidemia)   Diabetes mellitus with complication (HCC)   Acute [place] ([place]brovascular accident) ([place]   Tobacco use   #Acute right thalamic lacunar infarct, stable Patient presented after 2 days of left face numbness that progressed to left-sided numbness and weakness. -CVA was found on MRI Misty.  On discharge patient is neurologically intact with no residual weakness. PT/OT  evaluation with no needs on discharge -Aggressive risk factor modification included better control of diabetes, smoking cessation counseling, HTN control, high intensity statin, Etoh cessation - No arrhythmias on telemetry, TTE and carotid ultrasound showed no cardiac or intraarterial source of emboli. Suspected etiology small vessel disease - Discharged on aspirin 328m atorvastatin 80 mg, counseled on lifestyle modifcations and adherence with new medications #Abnormal EKG Questionable diffuse ST depression.  Troponin negative.  Patient remained without chest pain.  #Type 2 diabetes with peripheral neuropathy, poorly controlled -A1c 12 on admission -Patient previously on metformin, was unable to afford insulin pen as an outpatient so has not been taking that therapy -Given Lantus 10 units here in hospital and transitioned to Novolin 70/30 6 U BID -Social worker consulted to assist with medication management and affordability, [facility]  #Hypertension, at goal Continued on home lisinopril  #Current tobacco use Patient currently in pre-contemplative phase.  Emphasized importance of living with eventual smoking cessation to decrease risk factors for repeat CVA.  Patient states nicotine patch does not work for her.  Encouraged her to discuss with PCP other modalities including Wellbutrin, Chantix  #Alcohol use/motion -Monitored on CIWA protocol. -We will prescribe multivitamin, folic acid, [id number] discharge     Antibiotics:None  Microbiology:None  Consultations:  Neurology/[facility]   Procedures/Studies:   TTE on 12/16/17: EF 60-65%, no wall motion abnormalities, grade 1 diastolic dysfunction, no cardiac source of emboli identified  Carotid ultrasound on 12/16/17:  1-39% stenosis of right and left ICA. Bilateral antegrade flow of vertebral arteries  MRI Misty without:  Acute right  thalamic lacunar infarct, mild chronic small vessel ischemic disease  MRA head without contrast: No evidence of high-grade stenosis.  Normal MRA of the head     Dg Chest 2 View  Result Date: 12/15/2017 CLINICAL DATA:  47 year old female with cough for several weeks. Left arm and neck numbness since yesterday. Smoker. EXAM: CHEST - 2 VIEW COMPARISON:  Chest radiographs and thoracic spine MRI 09/30/2016, and earlier FINDINGS: Lung volumes remain normal. Mediastinal contours remain normal. Visualized tracheal air column is within normal limits. No pneumothorax, pulmonary edema, pleural effusion or confluent pulmonary opacity. Chronic mild increased pulmonary interstitial opacity is stable. Stable visualized osseous structures. Negative visible bowel gas pattern. IMPRESSION: No acute cardiopulmonary abnormality. Electronically Signed   By: Genevie Ann M.D.   On: 12/15/2017 14:54   [name] Mccullough Contrast  Result Date: 12/15/2017 CLINICAL DATA:  47 y/o  F; stroke for follow-up. EXAM: MRA HEAD WITHOUT CONTRAST TECHNIQUE: Angiographic images of the Circle of Willis were obtained using MRA technique without intravenous contrast. COMPARISON:  12/15/2017 MRI of the head. FINDINGS: Internal carotid arteries: Patent. Prominent infundibulum of left inferolateral trunk. Anterior cerebral arteries:  Patent. Middle cerebral arteries: Patent. Anterior communicating artery: Not identified, likely hypoplastic or absent. Posterior communicating arteries: Not identified, likely hypoplastic or absent. Posterior cerebral arteries:  Patent. Basilar artery:  Patent. Vertebral arteries:  Patent. No evidence of high-grade stenosis, large vessel occlusion, or aneurysm unless noted above. IMPRESSION: Normal MRA of the head. Electronically Signed   By: Kristine Garbe M.D.   On: 12/15/2017 23:19   [name] Contrast  Result Date: 12/15/2017 CLINICAL DATA:  Left arm weakness and clumsiness. EXAM: MRI HEAD WITHOUT CONTRAST  TECHNIQUE: Multiplanar, multiecho pulse sequences of the Misty and surrounding structures were obtained without intravenous contrast. COMPARISON:  None. FINDINGS: Misty: There is a 6 mm acute infarct in the right thalamus. No intracranial hemorrhage, mass, midline shift, or extra-axial fluid collection is present. The ventricles and sulci are normal. Scattered punctate foci of cerebral white matter T2 hyperintensity are slightly prominent for age and nonspecific but compatible with mild chronic small vessel ischemic disease. Vascular: Major intracranial vascular flow voids are preserved. There is fullness of the right MCA bifurcation, and a 3-4 mm aneurysm is questioned. Skull and upper cervical spine: Unremarkable bone marrow signal. Sinuses/Orbits: Unremarkable orbits. Small left mastoid effusion. Clear paranasal sinuses. Other: None. IMPRESSION: 1. Acute right thalamic lacunar infarct. 2. Mild chronic small vessel ischemic disease. 3. 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 Head MRA is recommended for further evaluation. Electronically Signed   By: Logan Bores M.D.   On: 12/15/2017 13:16      Discharge Exam: BP (!) 156/89 (BP Location: Left Arm)   Pulse 72   Temp 98.4 F (36.9 C) (Oral)   Resp 17   Ht 5' 4"  (1.626 m)   Wt 61.2 kg (135 lb)   LMP 12/11/2017   SpO2 99%   BMI 23.17 kg/m   General: Thin female who looks older than stated age, comfortably sitting up in bed Eyes: EOMI, anicteric ENT: Oral Mucosa clear and moist Cardiovascular: regular rate and rhythm, no murmurs, rubs or gallops, no edema Respiratory: Normal respiratory effort on room air, lungs clear to auscultation bilaterally Abdomen: soft, non-distended, non-tender, normal bowel sounds Skin: No Rash Musculoskeletal:Good ROM, no contractures. Normal muscle tone Neurologic: Grossly no focal neuro deficit.Mental status AAOx4 Psychiatric:Appropriate affect, and mood    Discharge Instructions You were cared for  by a hospitalist during your hospital stay. If you have any questions about your discharge medications or the care you received while you were in the hospital after you are discharged, you can call the unit and asked to speak with the hospitalist on call if the hospitalist that took care of you is not available. Once you are discharged, your primary care physician will handle any further medical issues. Please note that NO REFILLS for any discharge medications will be authorized once you are discharged, as it is imperative that you return to your primary care physician (or establish a relationship with a primary care physician if you do not have one) for your aftercare needs so that they can reassess your need for medications and monitor your lab values.  Discharge Instructions    Ambulatory referral to Neurology   Complete by:  As directed    An appointment is requested in approximately: 6 weeks Follow up with stroke clinic NP (Jessica Vanschaick or Cecille Rubin, if both not available, consider Zachery Dauer, or Ahern) at [facility] in about 4 weeks. Thanks.   Diet - low sodium heart healthy   Complete by:  As directed    Increase activity slowly   Complete by:  As directed      Allergies as of 12/16/2017   No Known Allergies     Medication List    STOP taking these medications   dicyclomine 10 MG capsule Commonly known as:  BENTYL   GOODY HEADACHE PO   methocarbamol 500 MG tablet Commonly known as:  ROBAXIN   omeprazole 20 MG capsule Commonly known as:  PRILOSEC   pravastatin 20 MG tablet Commonly known as:  PRAVACHOL   traMADol 50 MG tablet Commonly known as:  ULTRAM     TAKE these medications   aspirin 325 MG EC tablet Take 1 tablet (325 mg total) by mouth daily. Start taking on:  12/17/2017 What changed:    medication strength  how much to take   atorvastatin 80 MG tablet Commonly known as:  LIPITOR Take 1 tablet (80 mg total) by mouth daily at 6 PM.   diclofenac  sodium 1 % Gel Commonly known as:  VOLTAREN Apply 4 g topically 4 (four) times daily.   feeding supplement (ENSURE ENLIVE) Liqd Take 237 mLs by mouth 2 (two) times daily between meals.   fluticasone 50 MCG/ACT nasal spray Commonly known as:  FLONASE Place 2 sprays into both nostrils daily.   folic acid 1 MG tablet Commonly known as:  FOLVITE Take 1 tablet (1 mg total) by mouth daily. Start taking on:  12/17/2017   gabapentin 300 MG capsule Commonly known as:  NEURONTIN Take 1 capsule (300 mg total) by mouth 3 (three) times daily.   insulin NPH-regular Human (70-30) 100 UNIT/ML injection Commonly known as:  NOVOLIN 70/30 6 units in the morning before breakfast and 6 units in the evening before dinner   insulin starter kit- syringes Misc 1 kit by Other route once for 1 dose.   lisinopril 20 MG tablet Commonly known as:  PRINIVIL,ZESTRIL Take 2 tablets (40 mg total) by mouth daily.   metFORMIN 500 MG tablet Commonly known as:  GLUCOPHAGE Take 1 tablet (500 mg total) by mouth 2 (two) times daily with a meal.   multivitamin with minerals Tabs tablet Take 1 tablet by mouth daily. Start taking on:  12/17/2017   NYQUIL COLD & FLU PO Take 1 capsule by mouth at bedtime as needed (pain/sleep).  thiamine 100 MG tablet Take 1 tablet (100 mg total) by mouth daily. Start taking on:  12/17/2017      No Known Allergies Follow-up Information    Garvin Fila, MD. Schedule an appointment as soon as possible for a visit in 6 week(s).   Specialties:  Neurology, Radiology Contact information: [address] [place]  [address] [phone]            The results of significant diagnostics from this hospitalization (including imaging, microbiology, ancillary and laboratory) are listed below for reference.    Significant Diagnostic Studies: Dg Chest 2 View  Result Date: 12/15/2017 CLINICAL DATA:  47 year old female with cough for several weeks. Left arm and  neck numbness since yesterday. Smoker. EXAM: CHEST - 2 VIEW COMPARISON:  Chest radiographs and thoracic spine MRI 09/30/2016, and earlier FINDINGS: Lung volumes remain normal. Mediastinal contours remain normal. Visualized tracheal air column is within normal limits. No pneumothorax, pulmonary edema, pleural effusion or confluent pulmonary opacity. Chronic mild increased pulmonary interstitial opacity is stable. Stable visualized osseous structures. Negative visible bowel gas pattern. IMPRESSION: No acute cardiopulmonary abnormality. Electronically Signed   By: Genevie Ann M.D.   On: 12/15/2017 14:54   [name] Mccullough Contrast  Result Date: 12/15/2017 CLINICAL DATA:  47 y/o  F; stroke for follow-up. EXAM: MRA HEAD WITHOUT CONTRAST TECHNIQUE: Angiographic images of the Circle of Willis were obtained using MRA technique without intravenous contrast. COMPARISON:  12/15/2017 MRI of the head. FINDINGS: Internal carotid arteries: Patent. Prominent infundibulum of left inferolateral trunk. Anterior cerebral arteries:  Patent. Middle cerebral arteries: Patent. Anterior communicating artery: Not identified, likely hypoplastic or absent. Posterior communicating arteries: Not identified, likely hypoplastic or absent. Posterior cerebral arteries:  Patent. Basilar artery:  Patent. Vertebral arteries:  Patent. No evidence of high-grade stenosis, large vessel occlusion, or aneurysm unless noted above. IMPRESSION: Normal MRA of the head. Electronically Signed   By: Kristine Garbe M.D.   On: 12/15/2017 23:19   [name] Contrast  Result Date: 12/15/2017 CLINICAL DATA:  Left arm weakness and clumsiness. EXAM: MRI HEAD WITHOUT CONTRAST TECHNIQUE: Multiplanar, multiecho pulse sequences of the Misty and surrounding structures were obtained without intravenous contrast. COMPARISON:  None. FINDINGS: Misty: There is a 6 mm acute infarct in the right thalamus. No intracranial hemorrhage, mass, midline shift, or extra-axial  fluid collection is present. The ventricles and sulci are normal. Scattered punctate foci of cerebral white matter T2 hyperintensity are slightly prominent for age and nonspecific but compatible with mild chronic small vessel ischemic disease. Vascular: Major intracranial vascular flow voids are preserved. There is fullness of the right MCA bifurcation, and a 3-4 mm aneurysm is questioned. Skull and upper cervical spine: Unremarkable bone marrow signal. Sinuses/Orbits: Unremarkable orbits. Small left mastoid effusion. Clear paranasal sinuses. Other: None. IMPRESSION: 1. Acute right thalamic lacunar infarct. 2. Mild chronic small vessel ischemic disease. 3. 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 Head MRA is recommended for further evaluation. Electronically Signed   By: Logan Bores M.D.   On: 12/15/2017 13:16    Microbiology: No results found for this or any previous visit (from the past 240 hour(s)).   Labs: Basic Metabolic Panel: Recent Labs  Lab 12/15/17 1201 12/15/17 1329 12/16/17 0348  NA 138 138 136  K 4.2 4.7 3.7  CL 103 104 105  CO2  --  25 24  GLUCOSE 280* 260* 236*  BUN 5* 5* 9  CREATININE 0.40* 0.53 0.48  CALCIUM  --  9.0 8.7*   Liver Function Tests: Recent Labs  Lab 12/15/17 1329 12/16/17 0348  AST 17 17  ALT 16 15  ALKPHOS 76 76  BILITOT 0.6 0.6  PROT 6.7 6.5  ALBUMIN 3.5 3.2*   No results for input(s): LIPASE, AMYLASE in the last 168 hours. No results for input(s): AMMONIA in the last 168 hours. CBC: Recent Labs  Lab 12/15/17 1201 12/15/17 1329 12/16/17 0348  WBC  --  4.9 4.5  NEUTROABS  --  2.7  --   HGB 12.9 12.9 12.6  HCT 38.0 38.9 37.7  MCV  --  87.4 87.1  PLT  --  202 186   Cardiac Enzymes: Recent Labs  Lab 12/15/17 1929  TROPONINI <0.03   BNP: BNP (last 3 results) No results for input(s): BNP in the last 8760 hours.  ProBNP (last 3 results) No results for input(s): PROBNP in the last 8760 hours.  CBG: Recent Labs  Lab  12/15/17 2151 12/16/17 0635 12/16/17 1156 12/16/17 1624  GLUCAP 288* 236* 328* 249*       Signed:  Desiree Hane, MD [facility] 12/16/2017, 6:13 PM

## 2017-12-16 NOTE — Progress Notes (Addendum)
NEUROHOSPITALISTS STROKE TEAM - DAILY PROGRESS NOTE   ADMISSION HISTORY:  Misty Mccullough is a 47 y.o. female with T2DM, HTN, HLD, and tobacco use who presents to the ED with left face and arm numbness with reported LUE weakness. She is accompanied by 2 daughters.  She reports being in her usual state of health (completes all ADLs, ambulates without assistance) prior to going to bed the night of 3/9. When she awoke, she noticed having numbness at the left side of her Mccullough which involved her left arm all the way down to the hand. She thought this would improve on its own and went about her day as usual. Today, she noticed that the numbness is involving more of the left side of her face towards her cheek. She also has felt as if her left arm was weaker than normal. She denies any other weakness. She reports chronic numbness in her feet which she attributes to her diabetes. She denies any recent headache, change in vision, chest pain, palpitations, or gait abnormality.  Her current medications include Metformin and Lisinopril. She takes a low dose aspirin only occasionally and admits she misses more doses than takes. She uses BC powders for headaches. She denies any obvious bleeding. She has smoked since age 59, currently reports 1.5 PPD (daughter thinks 2 PPD).  MRI Misty showed a 6 mm acute infarct in the right thalamus, changes associated with chronic small vessel disease, and questionable small right MCA bifurcation aneurysm.   LKW: >24 hours tpa given?: no, out of window Premorbid modified rankin scale: 0  NIHSS: 1  SUBJECTIVE (INTERVAL HISTORY) Family is at the bedside. Patient is found laying in bed in NAD. Overall she feels her condition is rapidly improving. Voices no new complaints. No new events reported overnight. Patient states her numbness is almost completely resolved. Patient is asking to be discharged home. Patient admits to some  medication/diet noncompliance and continuing to smoke. Patient counseled at length to take better control of her health.  Laboratory Results  CBC:  Recent Labs  Lab 12/15/17 1201 12/15/17 1329 12/16/17 0348  WBC  --  4.9 4.5  HGB 12.9 12.9 12.6  HCT 38.0 38.9 37.7  MCV  --  87.4 87.1  PLT  --  202 186   BMP: Recent Labs  Lab 12/15/17 1201 12/15/17 1329 12/16/17 0348  NA 138 138 136  K 4.2 4.7 3.7  CL 103 104 105  CO2  --  25 24  GLUCOSE 280* 260* 236*  BUN 5* 5* 9  CREATININE 0.40* 0.53 0.48  CALCIUM  --  9.0 8.7*   Recent Labs  Lab 12/15/17 1929  TROPONINI <0.03   Coagulation Studies:  Recent Labs    12/15/17 1329  APTT 30  INR 1.00   Alcohol Level:  Recent Labs  Lab 12/15/17 1329  ETH <10    Physical Examination   Vitals:   12/15/17 2100 12/15/17 2325 12/16/17 0430 12/16/17 1248  BP: (!) 152/70 (!) 161/83 (!) 133/42 (!) 154/59  Pulse: 70 81 72 74  Resp: 16 18 18 15   Temp:   98.8 F (37.1 C) 98.2 F (36.8 C)  TempSrc:   Oral Oral  SpO2: 99% 97% 97% 99%  Weight:      Height:       General - Well nourished, well developed, in no apparent distress HEENT-  Normocephalic,  Cardiovascular - Regular rate and rhythm  Respiratory - Lungs clear bilaterally. No wheezing. Abdomen - soft  and non-tender, BS normal Extremities- no edema or cyanosis  Neurological Examination  Mental Status: Patient is awake, alert, oriented to person, place, month, year, and situation. Patient is able to give a clear and coherent history. No signs of aphasia or neglect Cranial Nerves: II: Visual Fields are full. Pupils are equal, round, and reactive to light. III,IV, VI: EOMI without ptosis or diplopia.  V: Facial sensation is decreased on the left to light touch and temperature  VII: Facial movement is symmetric.  VIII: hearing is intact to voice X: Uvula elevates symmetrically XI: Shoulder shrug is symmetric. XII: Tongue is midline without atrophy or  fasciculations.  Motor: Tone is normal. Bulk is normal. 5/5 strength was present in all four extremities.  Sensory: Sensation is slightly decreased to light touch and temperature in the left arm, symmetric and equal in the legs. Deep Tendon Reflexes: 2+ and symmetric in the biceps and patellae.  Plantars: Toes are downgoing bilaterally.  Cerebellar: FNF and HKS are intact bilaterally, with exception of very subtle dyssinergia with FNF on the left  Imaging Results  Dg Chest 2 View  Result Date: 12/15/2017 CLINICAL DATA:  47 year old female with cough for several weeks. Left arm and neck numbness since yesterday. Smoker. EXAM: CHEST - 2 VIEW COMPARISON:  Chest radiographs and thoracic spine MRI 09/30/2016, and earlier FINDINGS: Lung volumes remain normal. Mediastinal contours remain normal. Visualized tracheal air column is within normal limits. No pneumothorax, pulmonary edema, pleural effusion or confluent pulmonary opacity. Chronic mild increased pulmonary interstitial opacity is stable. Stable visualized osseous structures. Negative visible bowel gas pattern. IMPRESSION: No acute cardiopulmonary abnormality. Electronically Signed   By: Odessa Fleming M.D.   On: 12/15/2017 14:54   [name] Mccullough Contrast  Result Date: 12/15/2017 CLINICAL DATA:  47 y/o  F; stroke for follow-up. EXAM: MRA Mccullough WITHOUT CONTRAST TECHNIQUE: Angiographic images of the Circle of Willis were obtained using MRA technique without intravenous contrast. COMPARISON:  12/15/2017 MRI of the Mccullough. FINDINGS: Internal carotid arteries: Patent. Prominent infundibulum of left inferolateral trunk. Anterior cerebral arteries:  Patent. Middle cerebral arteries: Patent. Anterior communicating artery: Not identified, likely hypoplastic or absent. Posterior communicating arteries: Not identified, likely hypoplastic or absent. Posterior cerebral arteries:  Patent. Basilar artery:  Patent. Vertebral arteries:  Patent. No evidence of high-grade  stenosis, large vessel occlusion, or aneurysm unless noted above. IMPRESSION: Normal MRA of the Mccullough. Electronically Signed   By: Mitzi Hansen M.D.   On: 12/15/2017 23:19   [name] Contrast  Result Date: 12/15/2017 CLINICAL DATA:  Left arm weakness and clumsiness. EXAM: MRI Mccullough WITHOUT CONTRAST TECHNIQUE: Multiplanar, multiecho pulse sequences of the Misty and surrounding structures were obtained without intravenous contrast. COMPARISON:  None. FINDINGS: Misty: There is a 6 mm acute infarct in the right thalamus. No intracranial hemorrhage, mass, midline shift, or extra-axial fluid collection is present. The ventricles and sulci are normal. Scattered punctate foci of cerebral white matter T2 hyperintensity are slightly prominent for age and nonspecific but compatible with mild chronic small vessel ischemic disease. Vascular: Major intracranial vascular flow voids are preserved. There is fullness of the right MCA bifurcation, and a 3-4 mm aneurysm is questioned. Skull and upper cervical spine: Unremarkable bone marrow signal. Sinuses/Orbits: Unremarkable orbits. Small left mastoid effusion. Clear paranasal sinuses. Other: None. IMPRESSION: 1. Acute right thalamic lacunar infarct. 2. Mild chronic small vessel ischemic disease. 3. 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 Mccullough MRA is recommended for further evaluation. Electronically Signed  By: Sebastian Ache M.D.   On: 12/15/2017 13:16   Echocardiogram:                                               Study Conclusions - Left ventricle: The cavity size was normal. Wall thickness was   normal. Systolic function was normal. The estimated ejection   fraction was in the range of 60% to 65%. Wall motion was normal;   there were no regional wall motion abnormalities. Doppler   parameters are consistent with abnormal left ventricular   relaxation (grade 1 diastolic dysfunction). - Aortic valve: Mildly calcified annulus. Trileaflet;  mildly   thickened, mildly calcified leaflets. Impressions:- No cardiac source of emboli was indentified.  B/L Carotid U/S:   Bilateral 1-39% stenosis. Vertebral arteries are patent with antegrade flow.  IMPRESSION AND PLAN  Ms. Sequoia Witz is a 47 y.o. female with PMH of T2DM, HTN, HLD, and tobacco use who presents to the ED with left face and arm numbness, MRI Mccullough reveals:  6 mm acute infarct in the right thalamus.  Suspected Etiology: small vessel disease Resultant Symptoms: left face and arm numbness Stroke Risk Factors: diabetes mellitus, hyperlipidemia, hypertension and smoking Other Stroke Risk Factors: Advanced age, Cigarette smoker, ETOH use  Outstanding Stroke Work-up Studies:    Work up completed  PLAN 12/16/2017  Continue Aspirin/ Statin Frequent neuro checks Telemetry monitoring PT/OT/SLP Ongoing aggressive stroke risk factor management Patient counseled to be compliant with her antithrombotic medications Patient counseled on Lifestyle modifications including, Diet, Exercise, and Stress Follow up with [facility] in 6 weeks  MEDICAL ISSUES   R/O AFIB: If A.FIB found on telemetry monitoring, the patient will need AC therapy  HYPERTENSION: Stable Permissive hypertension (OK if <220/120) for 24-48 hours post stroke and then gradually normalized within 5-7 days. Long term BP goal normotensive. May slowly restart home B/P medications after 48 hours Home Meds: yes  HYPERLIPIDEMIA:    Component Value Date/Time   CHOL 181 12/16/2017 0348   TRIG 86 12/16/2017 0348   HDL 31 (L) 12/16/2017 0348   CHOLHDL 5.8 12/16/2017 0348   VLDL 17 12/16/2017 0348   LDLCALC 133 (H) 12/16/2017 0348  Home Meds:  Pravachol 20 mg, patient states she was not taking, states she did not have a Rx. LDL  goal < 70 Started on  Lipitor to 80 mg daily Continue statin at discharge  DIABETES: Lab Results  Component Value Date   HGBA1C 12.1 (H) 12/15/2017  HgbA1c goal <  7.0 Currently on: Novolog Continue CBG monitoring and SSI to maintain glucose 140-180 mg/dl DM education   TOBACCO ABUSE Current smoker Smoking cessation counseling provided Nicotine patch provided    Hospital day # 1 VTE prophylaxis: SCD's  Diet : Fall precautions Aspiration precautions Diet heart healthy/carb modified Room service appropriate? Yes; Fluid consistency: Thin     FAMILY UPDATES:  family at bedside  TEAM UPDATES: Laverna Peace, MD STATUS:    Discharge Information  Prior Home Stroke Medications:  aspirin 81 mg daily  Discharge Stroke Meds:  Please discharge patient on aspirin 325 mg daily     Disposition: 01-Home or Self Care Therapy Recs:               HOME  Follow up Appointments  Follow Up:  Follow-up Information    Delia Heady  S, MD. Schedule an appointment as soon as possible for a visit in 6 week(s).   Specialties:  Neurology, Radiology Contact information: [address] [place] [address] [phone]          Bing NeighborsHarris, Kimberly S, FNP -PCP Follow up in 1-2 weeks       Assessment & plan discussed with with attending physician and they are in agreement.    Beryl MeagerMary A Costello, ANP-C Stroke Neurology Team 12/16/2017, 1:58 PM  12/16/2017 ATTENDING ASSESSMENT   I reviewed above note and agree with the assessment and plan. I have made any additions or clarifications directly to the above note. Pt was seen and examined.   47 year old female with history of diabetes, hypertension, hyperlipidemia, smoker admitted for left-sided face and arm numbness and left arm weakness.  MRI showed right thalamic small infarct.  MRA, carotid Doppler and TTE unremarkable.  Stroke consistent with small vessel disease.  A1c 12.1 and LDL 133.  UDS pending.  Currently symptom resolved.  Patient not compliant with medication at home.  Now on aspirin and Lipitor, continue on discharge.  Continue risk factor modification.  Patient agrees to quit smoking and  follow up with PCP for better DM control.  Neurology will sign off. Please call with questions. [facility] in about 4 weeks. Thanks for the consult.   Marvel PlanJindong Kobi Mario, MD PhD Stroke Neurology 12/16/2017 2:51 PM  To contact Stroke Provider, please refer to [email or web address]. After hours, contact General Neurology

## 2017-12-16 NOTE — Progress Notes (Signed)
CM consulted for medication assistance. CM met with the patient and one of her daughters. CM inquired about her using [facility] pharmacy. Pt is already active with [facility] and has access to using [facility] pharmacy but currently uses [facility]. Pt and daughter are interested in using [facility] pharmacy to assist with the cost of her medications. [facility] pharmacy will also assist with the cost of insulin.  Pt will need either printed prescriptions or scripts sent to [facility] pharmacy. She will also need prescriptions for the pen needles or syringes. Pt states she has a meter. [facility] pharmacy closes at 5 pm. Pt will need to be over at the pharmacy by 4 pm to get her medications M-F.

## 2017-12-16 NOTE — Progress Notes (Signed)
Preliminary results by tech - Carotid Duplex Completed. Bilateral 1-39% stenosis. Vertebral arteries are patent with antegrade flow. Kunta Hilleary, BS, RDMS, RVT  

## 2017-12-16 NOTE — Progress Notes (Addendum)
Inpatient Diabetes Program Recommendations  AACE/ADA: New Consensus Statement on Inpatient Glycemic Control (2015)  Target Ranges:  Prepandial:   less than 140 mg/dL      Peak postprandial:   less than 180 mg/dL (1-2 hours)      Critically ill patients:  140 - 180 mg/dL   Lab Results  Component Value Date   GLUCAP 236 (H) 12/16/2017   HGBA1C 12.1 (H) 12/15/2017    Review of Glycemic Control Results for Misty Mccullough, Misty Mccullough (MRN [id number]) as of 12/16/2017 09:12  Ref. Range 12/15/2017 21:51 12/16/2017 06:35  Glucose-Capillary Latest Ref Range: 65 - 99 mg/dL 288 (H) 236 (H)   Diabetes history: Type 2 DM Outpatient Diabetes medications: Metformin 500 mg BID Current orders for Inpatient glycemic control: Novolog 0-15 Units TIDAC, Novolog 0-5 Units QHS  Inpatient Diabetes Program Recommendations:     Patient has A1C of 12.1%, up from 12/2016 of an A1C 6.7.   Expect insulin at discharge, and will plan to see patient today to assess resources. Noted patient has no insurance, so most likely will need Novolin 70/30 for $25/vial.   Recommending Lantus 10 Units QD.  Addendum: Spoke with patient regarding diabetes. Patient has been on insulin before and has used vial and syringe. Reviewed patient's current A1c of 12.1%. Explained what a A1c is and what it measures. Also reviewed goal A1c with patient, importance of good glucose control @ home, and blood sugar goals. Discussed the importance of of establishing good control in BS. Patho reviewed on DM, need for insulin, and the role of beta cells in the pancreas. Discussed basic survival skills and patient is used to following a carb modified diet. Currently, she states, "I see room for improvement and that I need to do this in an effort to get A1C down. I was doing well and fell off and now I got to do it again." Patient can get medications and testing supplies through the [facility]. She has a meter, but struggles to get test strips.  Information provided on [facility] products at [facility] and informed that order number will be supplied for the [facility] pharmacy below. Encouraged to start taking blood sugars twice daily. Educated on Novolin 70/30, as this would be my recommendation outpatient. Patient is familiar with 70/30 and used to taking meds BID. Ordered insulin kit with syringes to help aid patient in preparation for discharge. Patient had no further questions regarding diabetes at this time.  Blood glucose meter kit (includes lancets and strips) ([id number])   Thanks, Bronson Curb, MSN, RNC-OB Diabetes Coordinator [phone] (8a-5p)

## 2017-12-16 NOTE — Progress Notes (Signed)
Pt discharge education and instructions completed with pt and family at bedside; all voices understanding and denied any questions. Pt IV and telemetry removed; pt education reinforced on diabetes management and insulin administration. Pt voices understanding and demonstrated back to RN. Pt handed her insulin starter kit. Pt offered wheelchair but she declined and ambulated off unit with belongings and family at side. Pt discharge home with family to transport home. Delia Heady RN

## 2017-12-16 NOTE — Progress Notes (Signed)
  Echocardiogram 2D Echocardiogram has been performed.  Misty Mccullough Misty Mccullough 12/16/2017, 12:04 PM

## 2017-12-16 NOTE — Evaluation (Signed)
Occupational Therapy Evaluation Patient Details Name: Misty Mccullough MRN: [id number] DOB: 12-23-1970 Today's Date: 12/16/2017    History of Present Illness Patient is a 47 y/o female who presents with left sided weakness/numbness and facial numbness. MRI- right thalamic infarct with questionable R MCA aneurysm. PMH includes HTN, DM.   Clinical Impression   Pt is at independent baseline level of function with ADLs and mobility. Pt reports and demo no vision, strength or ROM impairments/deficits at this time. Pt/family ed on  signs/symptoms of stroke and the importance of BeFAST and time. Pt verbalizes understanding. No further acute OT services are indicated at this time    Follow Up Recommendations  No OT follow up    Equipment Recommendations  None recommended by OT    Recommendations for Other Services       Precautions / Restrictions Precautions Precautions: None Restrictions Weight Bearing Restrictions: No      Mobility Bed Mobility Overal bed mobility: Modified Independent             General bed mobility comments: HOB elevated.  Transfers Overall transfer level: Modified independent Equipment used: None             General transfer comment: Stood without difficulty, no dizziness.    Balance Overall balance assessment: Needs assistance Sitting-balance support: Feet supported;No upper extremity supported Sitting balance-Leahy Scale: Good     Standing balance support: During functional activity Standing balance-Leahy Scale: Good               High level balance activites: Backward walking;Direction changes;Turns;Sudden stops;Head turns High Level Balance Comments: Tolerated above with no deviations in gait.  Standardized Balance Assessment Standardized Balance Assessment : Dynamic Gait Index   Dynamic Gait Index Level Surface: Normal Change in Gait Speed: Normal Gait with Horizontal Head Turns: Mild Impairment Gait with Vertical Head Turns:  Mild Impairment Gait and Pivot Turn: Normal Step Over Obstacle: Normal Step Around Obstacles: Normal Steps: Mild Impairment Total Score: 21     ADL either performed or assessed with clinical judgement   ADL Overall ADL's : Independent;At baseline                                             Vision Baseline Vision/History: No visual deficits Patient Visual Report: No change from baseline Vision Assessment?: Yes Eye Alignment: Within Functional Limits Ocular Range of Motion: Within Functional Limits Alignment/Gaze Preference: Within Defined Limits Tracking/Visual Pursuits: Able to track stimulus in all quads without difficulty Visual Fields: No apparent deficits Additional Comments: pt reports that does no have any changes in vision; pt's dauhgter encouraged her to be honest about this if there are any changes. pt continmues to deny any vision chnages and      Perception     Praxis      Pertinent Vitals/Pain Pain Assessment: No/denies pain     Hand Dominance Right   Extremity/Trunk Assessment Upper Extremity Assessment Upper Extremity Assessment: Overall WFL for tasks assessed LUE Sensation: decreased light touch   Lower Extremity Assessment Lower Extremity Assessment: Defer to PT evaluation   Cervical / Trunk Assessment Cervical / Trunk Assessment: Normal   Communication Communication Communication: No difficulties   Cognition Arousal/Alertness: Awake/alert Behavior During Therapy: WFL for tasks assessed/performed Overall Cognitive Status: Within Functional Limits for tasks assessed  General Comments       Exercises     Shoulder Instructions      Home Living Family/patient expects to be discharged to:: Private residence Living Arrangements: Spouse/significant other;Children Available Help at Discharge: Family;Available PRN/intermittently Type of Home: House Home Access: Stairs to  enter Entrance Stairs-Number of Steps: 3 Entrance Stairs-Rails: Right Home Layout: Laundry or work area in basement;Two level Alternate Level Stairs-Number of Steps: 1 flight Alternate Level Stairs-Rails: Right     Bathroom Toilet: Standard     Home Equipment: None          Prior Functioning/Environment Level of Independence: Independent        Comments: Does IADLs.        OT Problem List: Decreased activity tolerance      OT Treatment/Interventions:      OT Goals(Current goals can be found in the care plan section) Acute Rehab OT Goals Patient Stated Goal: to go home OT Goal Formulation: With patient/family  OT Frequency:     Barriers to D/C:    no barriers       Co-evaluation              AM-PAC PT "6 Clicks" Daily Activity     Outcome Measure Help from another person eating meals?: None Help from another person taking care of personal grooming?: None Help from another person toileting, which includes using toliet, bedpan, or urinal?: None Help from another person bathing (including washing, rinsing, drying)?: None Help from another person to put on and taking off regular upper body clothing?: None Help from another person to put on and taking off regular lower body clothing?: None 6 Click Score: 24   End of Session    Activity Tolerance: Patient tolerated treatment well Patient left: in bed(sitting EOB)  OT Visit Diagnosis: Other symptoms and signs involving the nervous system (A54.098(R29.898)                Time: [id number] OT Time Calculation (min): 21 min Charges:  OT General Charges $OT Visit: 1 Visit OT Evaluation $OT Eval Low Complexity: 1 Low G-Codes: OT G-codes **NOT FOR INPATIENT CLASS** Functional Assessment Tool Used: AM-PAC 6 Clicks Daily Activity     Galen ManilaSpencer, Jun Rightmyer Jeanette 12/16/2017, 12:48 PM

## 2017-12-17 MED FILL — ATORVASTATIN 80 MG TABLET: 80 | 30 days supply | Qty: 30 | Fill #0

## 2017-12-17 MED FILL — !NOVOLIN 70/30 100 UNITS/ML: (70-30) 100 | 28 days supply | Qty: 10 | Fill #0

## 2017-12-17 MED FILL — FOLIC ACID 1 MG TABLET: 1 | 30 days supply | Qty: 30 | Fill #0

## 2017-12-18 ENCOUNTER — Other Ambulatory Visit: Payer: Self-pay | Admitting: Family Medicine

## 2017-12-18 DIAGNOSIS — I10 Essential (primary) hypertension: Secondary | ICD-10-CM

## 2017-12-18 MED ORDER — LISINOPRIL 20 MG PO TABS: 40.0000 mg | ORAL_TABLET | Freq: Every day | ORAL | 0 refills | Status: DC

## 2017-12-29 ENCOUNTER — Encounter: Payer: Self-pay | Admitting: Family Medicine

## 2017-12-29 ENCOUNTER — Ambulatory Visit (INDEPENDENT_AMBULATORY_CARE_PROVIDER_SITE_OTHER): Payer: Self-pay | Admitting: Family Medicine

## 2017-12-29 VITALS — BP 108/60 | HR 76 | Temp 97.9°F | Resp 16 | Ht 64.0 in | Wt 157.0 lb

## 2017-12-29 DIAGNOSIS — Z1329 Encounter for screening for other suspected endocrine disorder: Secondary | ICD-10-CM

## 2017-12-29 DIAGNOSIS — E118 Type 2 diabetes mellitus with unspecified complications: Secondary | ICD-10-CM

## 2017-12-29 DIAGNOSIS — I639 Cerebral infarction, unspecified: Secondary | ICD-10-CM

## 2017-12-29 DIAGNOSIS — Z720 Tobacco use: Secondary | ICD-10-CM

## 2017-12-29 LAB — POCT URINALYSIS DIP (DEVICE)
BILIRUBIN URINE: NEGATIVE
Glucose, UA: 100 mg/dL — AB
HGB URINE DIPSTICK: NEGATIVE
KETONES UR: NEGATIVE mg/dL
Leukocytes, UA: NEGATIVE
Nitrite: NEGATIVE
PH: 5.5 (ref 5.0–8.0)
PROTEIN: NEGATIVE mg/dL
SPECIFIC GRAVITY, URINE: 1.015 (ref 1.005–1.030)
Urobilinogen, UA: 1 mg/dL (ref 0.0–1.0)

## 2017-12-29 MED ORDER — METFORMIN HCL 1000 MG PO TABS: 1000.0000 mg | ORAL_TABLET | Freq: Two times a day (BID) | ORAL | 6 refills | Status: DC

## 2017-12-29 MED ORDER — BUPROPION HCL ER (SR) 150 MG PO TB12: 150.0000 mg | ORAL_TABLET | Freq: Two times a day (BID) | ORAL | 1 refills | Status: DC

## 2017-12-29 MED ORDER — INSULIN PEN NEEDLE 29G X 12.7MM MISC: 2 refills | Status: DC

## 2017-12-29 MED ORDER — INSULIN GLARGINE 100 UNIT/ML SOLOSTAR PEN: 10.0000 [IU] | PEN_INJECTOR | Freq: Every day | SUBCUTANEOUS | 11 refills | Status: DC

## 2017-12-29 MED ORDER — RANITIDINE HCL 150 MG PO TABS: 150.0000 mg | ORAL_TABLET | Freq: Two times a day (BID) | ORAL | 0 refills | Status: DC

## 2017-12-29 NOTE — Progress Notes (Signed)
Patient ID: Misty Mccullough Mccullough, female    DOB: July 22, 1971, 47 y.o.   MRN: [id number]  PCP: Bing NeighborsHarris, Geana Walts S, FNP  Chief Complaint  Patient presents with  . Hospitalization Follow-up    Subjective:  HPI Misty Mccullough is a 47 y.o. female with newly diagnosed diabetes, recent CVA, hypertension, tobacco use, presents for a hospital follow-up. [name] presented to the ED with a complaint of left sided face and arm numbness on 12/16/2017. She was found to have suffered from right thalamic lacunar infarct. During her hospitalization, diabetes was found to be out of control with an A1C 12.1. She was started on inulin and advised to resume metformin.  She doesn't have a listing of home blood sugars, although reports readings remain high when checked. She continues to smoke, although is considering resuming nicotine patches to assist with smoking cessation. She denies any deficits related to CVA. Denies weakness, forgetfulness, headaches, dizziness, or new weakness/numbness. She is not participating in stroke rehab as she is uninsured. Denies any other complaints today.  Social History   Socioeconomic History  . Marital status: Married    Spouse name: Not on file  . Number of children: Not on file  . Years of education: Not on file  . Highest education level: Not on file  Occupational History  . Not on file  Social Needs  . Financial resource strain: Not on file  . Food insecurity:    Worry: Not on file    Inability: Not on file  . Transportation needs:    Medical: Not on file    Non-medical: Not on file  Tobacco Use  . Smoking status: Current Every Day Smoker    Packs/day: 1.50  . Smokeless tobacco: Never Used  . Tobacco comment: Since age 47.  Substance and Sexual Activity  . Alcohol use: Yes    Comment: Occasional  . Drug use: No  . Sexual activity: Not on file  Lifestyle  . Physical activity:    Days per week: Not on file    Minutes per session: Not on file  . Stress: Not on file   Relationships  . Social connections:    Talks on phone: Not on file    Gets together: Not on file    Attends religious service: Not on file    Active member of club or organization: Not on file    Attends meetings of clubs or organizations: Not on file    Relationship status: Not on file  . Intimate partner violence:    Fear of current or ex partner: Not on file    Emotionally abused: Not on file    Physically abused: Not on file    Forced sexual activity: Not on file  Other Topics Concern  . Not on file  Social History Narrative  . Not on file    Family History  Problem Relation Age of Onset  . Diabetes Mother   . Uterine cancer Mother   . Diabetes Father   . Heart attack Father [id number]  . Stroke Father   . Heart attack Paternal Grandmother   . Heart attack Paternal Grandfather   . Stroke Sister   . Heart attack Paternal Uncle   . Bone cancer Paternal Uncle    Review of Systems Pertinent negatives listed in HPI  Patient Active Problem List   Diagnosis Date Noted  . Tobacco use 12/16/2017  . Acute CVA (cerebrovascular accident) (HCC) 12/15/2017  . Malnutrition of moderate degree 08/14/2016  .  Chest pain 08/13/2016  . Back pain 08/13/2016  . Essential hypertension 08/13/2016  . HLD (hyperlipidemia) 08/13/2016  . Diabetes mellitus with complication (HCC) 08/13/2016  . Chest pain, exertional     No Known Allergies  Prior to Admission medications   Medication Sig Start Date End Date Taking? Authorizing Provider  aspirin 325 MG EC tablet Take 1 tablet (325 mg total) by mouth daily. 12/17/17  Yes Roberto Scales D, MD  atorvastatin (LIPITOR) 80 MG tablet Take 1 tablet (80 mg total) by mouth daily at 6 PM. 12/16/17  Yes Roberto Scales D, MD  folic acid (FOLVITE) 1 MG tablet Take 1 tablet (1 mg total) by mouth daily. 12/17/17  Yes Roberto Scales D, MD  insulin NPH-regular Human (NOVOLIN 70/30) (70-30) 100 UNIT/ML injection 6 units in the morning before breakfast and 6 units in  the evening before dinner 12/16/17  Yes Roberto Scales D, MD  lisinopril (PRINIVIL,ZESTRIL) 20 MG tablet Take 2 tablets (40 mg total) by mouth daily. 12/18/17  Yes Bing Neighbors, FNP  metFORMIN (GLUCOPHAGE) 500 MG tablet Take 1 tablet (500 mg total) by mouth 2 (two) times daily with a meal. 12/03/17  Yes Bing Neighbors, FNP  Multiple Vitamin (MULTIVITAMIN WITH MINERALS) TABS tablet Take 1 tablet by mouth daily. 12/17/17  Yes Roberto Scales D, MD  thiamine 100 MG tablet Take 1 tablet (100 mg total) by mouth daily. 12/17/17  Yes Roberto Scales D, MD  diclofenac sodium (VOLTAREN) 1 % GEL Apply 4 g topically 4 (four) times daily. Patient not taking: Reported on 12/15/2017 10/07/16   Pisciotta, Joni Reining, PA-C  gabapentin (NEURONTIN) 300 MG capsule Take 1 capsule (300 mg total) by mouth 3 (three) times daily. Patient not taking: Reported on 12/15/2017 12/06/16   Massie Maroon, FNP    Past Medical, Surgical Family and Social History reviewed and updated.    Objective:   Today's Vitals   12/29/17 1142  BP: 108/60  Pulse: 76  Resp: 16  Temp: 97.9 F (36.6 C)  TempSrc: Oral  SpO2: 98%  Weight: 157 lb (71.2 kg)  Height: 5\' 4"  (1.626 m)    Wt Readings from Last 3 Encounters:  12/29/17 157 lb (71.2 kg)  12/15/17 135 lb (61.2 kg)  03/31/17 143 lb (64.9 kg)   Physical Exam Constitutional: Patient appears well-developed and well-nourished. No distress. HENT: Normocephalic, atraumatic. Eyes: Conjunctivae and EOM are normal. PERRLA, no scleral icterus. Neck: Normal ROM. Neck supple. No JVD. No tracheal deviation. No thyromegaly. CVS: RRR, S1/S2 +, no murmurs, no gallops, no carotid bruit.  Pulmonary: Effort and breath sounds normal, no stridor, rhonchi, wheezes, rales.  Abdominal: Soft. BS +, no distension, tenderness, rebound or guarding.  Musculoskeletal: Normal range of motion. No edema and no tenderness.  Neuro: Alert. Normal reflexes, muscle tone coordination. No cranial nerve  deficit. Skin: Skin is warm and dry. No rash noted. Not diaphoretic. No erythema. No pallor. Psychiatric: Normal mood and affect. Behavior, judgment, thought content normal.   Assessment & Plan:  1. Screening for thyroid disorder, check TSH   2. Cerebrovascular accident (CVA), unspecified mechanism (HCC), managed with high-dose aspirin. No deficits noted during exam today. Continue follow-up with neurology.  3. Type 2 diabetes mellitus with complication, without long-term current use of insulin (HCC), A1C 12.1. To increase compliance and provide improved glycemic control will start Lantus 10 units as bedtime with high protein snack. Continue metformin 1000 mg twice daily with meals.  Consider adding glipizide 10 mg once daily  if no improvement of A1C.  4. Tobacco Use, Wellbutrin 150 mg and twice daily SR to reduce smoking cravings.   Meds ordered this encounter  Medications  . buPROPion (WELLBUTRIN SR) 150 MG 12 hr tablet    Sig: Take 1 tablet (150 mg total) by mouth 2 (two) times daily.    Dispense:  60 tablet    Refill:  1    Order Specific Question:   Supervising Provider    Answer:   Quentin Angst [id number]  . DISCONTD: Insulin Glargine (LANTUS SOLOSTAR) 100 UNIT/ML Solostar Pen    Sig: Inject 10 Units into the skin daily at 10 pm.    Dispense:  15 mL    Refill:  11    Order Specific Question:   Supervising Provider    Answer:   Quentin Angst [[id number]]  . Insulin Pen Needle (BD ULTRA-FINE PEN NEEDLES) 29G X 12.7MM MISC    Sig: ICD10 E11.9 for use with insulin administration    Dispense:  200 each    Refill:  2    Order Specific Question:   Supervising Provider    Answer:   Quentin Angst [id number]  . ranitidine (ZANTAC) 150 MG tablet    Sig: Take 1 tablet (150 mg total) by mouth 2 (two) times daily.    Dispense:  60 tablet    Refill:  0    Order Specific Question:   Supervising Provider    Answer:   Quentin Angst [id number]  . metFORMIN (GLUCOPHAGE)  1000 MG tablet    Sig: Take 1 tablet (1,000 mg total) by mouth 2 (two) times daily with a meal.    Dispense:  60 tablet    Refill:  6    Order Specific Question:   Supervising Provider    Answer:   Quentin Angst [[id number]]   Orders Placed This Encounter  Procedures  . CBC with Differential  . Comprehensive metabolic panel  . TSH  . POCT urinalysis dip (device)    RTC: 1 month diabetes follow-up.  Godfrey Pick. Tiburcio Pea, MSN, FNP-C [facility]  [address] [place] Maroon [place], [place] [address] [phone]

## 2017-12-29 NOTE — Patient Instructions (Addendum)
For diabetes management:   Discontinue Novolin.  Start Lantus 10 units daily at bedtime.   I have increased your metformin 1000 mg twice daily with food.   For smoking cessation and assistance with cravings, I am prescribing Wellbutrin 150 mg twice daily.   I am referring you to diabetes education to assist with diet choices.  For gas over production I recommend decrease intake of high amounts of soda. Increase fiber and will try ranitidine 150 mg twice daily as needed.   Continue all other medications as prescribed.    Abdominal Bloating When you have abdominal bloating, your abdomen may feel full, tight, or painful. It may also look bigger than normal or swollen (distended). Common causes of abdominal bloating include:  Swallowing air.  Constipation.  Problems digesting food.  Eating too much.  Irritable bowel syndrome. This is a condition that affects the large intestine.  Lactose intolerance. This is an inability to digest lactose, a natural sugar in dairy products.  Celiac disease. This is a condition that affects the ability to digest gluten, a protein found in some grains.  Gastroparesis. This is a condition that slows down the movement of food in the stomach and small intestine. It is more common in people with diabetes mellitus.  Gastroesophageal reflux disease (GERD). This is a digestive condition that makes stomach acid flow back into the esophagus.  Urinary retention. This means that the body is holding onto urine, and the bladder cannot be emptied all the way.  Follow these instructions at home: Eating and drinking  Avoid eating too much.  Try not to swallow air while talking or eating.  Avoid eating while lying down.  Avoid these foods and drinks: ? Foods that cause gas, such as broccoli, cabbage, cauliflower, and baked beans. ? Carbonated drinks. ? Hard candy. ? Chewing gum. Medicines  Take over-the-counter and prescription medicines only as told  by your health care provider.  Take probiotic medicines. These medicines contain live bacteria or yeasts that can help digestion.  Take coated peppermint oil capsules. Activity  Try to exercise regularly. Exercise may help to relieve bloating that is caused by gas and relieve constipation. General instructions  Keep all follow-up visits as told by your health care provider. This is important. Contact a health care provider if:  You have nausea and vomiting.  You have diarrhea.  You have abdominal pain.  You have unusual weight loss or weight gain.  You have severe pain, and medicines do not help. Get help right away if:  You have severe chest pain.  You have trouble breathing.  You have shortness of breath.  You have trouble urinating.  You have darker urine than normal.  You have blood in your stools or have dark, tarry stools. Summary  Abdominal bloating means that the abdomen is swollen.  Common causes of abdominal bloating are swallowing air, constipation, and problems digesting food.  Avoid eating too much and avoid swallowing air.  Avoid foods that cause gas, carbonated drinks, hard candy, and chewing gum. This information is not intended to replace advice given to you by your health care provider. Make sure you discuss any questions you have with your health care provider. Document Released: 10/25/2016 Document Revised: 10/25/2016 Document Reviewed: 10/25/2016 Elsevier Interactive Patient Education  [employer]2018 [employer]

## 2017-12-30 ENCOUNTER — Other Ambulatory Visit: Payer: Self-pay | Admitting: Family Medicine

## 2017-12-30 ENCOUNTER — Encounter: Payer: Self-pay | Admitting: Family Medicine

## 2017-12-30 ENCOUNTER — Other Ambulatory Visit: Payer: Self-pay

## 2017-12-30 DIAGNOSIS — I10 Essential (primary) hypertension: Secondary | ICD-10-CM

## 2017-12-30 LAB — CBC WITH DIFFERENTIAL/PLATELET
BASOS ABS: 0 10*3/uL (ref 0.0–0.2)
Basos: 1 %
EOS (ABSOLUTE): 0.1 10*3/uL (ref 0.0–0.4)
Eos: 1 %
Hematocrit: 38.1 % (ref 34.0–46.6)
Hemoglobin: 12.3 g/dL (ref 11.1–15.9)
IMMATURE GRANS (ABS): 0 10*3/uL (ref 0.0–0.1)
Immature Granulocytes: 0 %
LYMPHS: 35 %
Lymphocytes Absolute: 2 10*3/uL (ref 0.7–3.1)
MCH: 28.4 pg (ref 26.6–33.0)
MCHC: 32.3 g/dL (ref 31.5–35.7)
MCV: 88 fL (ref 79–97)
Monocytes Absolute: 0.4 10*3/uL (ref 0.1–0.9)
Monocytes: 7 %
NEUTROS ABS: 3.4 10*3/uL (ref 1.4–7.0)
Neutrophils: 56 %
PLATELETS: 189 10*3/uL (ref 150–379)
RBC: 4.33 x10E6/uL (ref 3.77–5.28)
RDW: 15 % (ref 12.3–15.4)
WBC: 5.9 10*3/uL (ref 3.4–10.8)

## 2017-12-30 LAB — COMPREHENSIVE METABOLIC PANEL
ALK PHOS: 102 IU/L (ref 39–117)
ALT: 20 IU/L (ref 0–32)
AST: 15 IU/L (ref 0–40)
Albumin/Globulin Ratio: 1.4 (ref 1.2–2.2)
Albumin: 4.1 g/dL (ref 3.5–5.5)
BILIRUBIN TOTAL: 0.3 mg/dL (ref 0.0–1.2)
BUN/Creatinine Ratio: 18 (ref 9–23)
BUN: 10 mg/dL (ref 6–24)
CHLORIDE: 102 mmol/L (ref 96–106)
CO2: 22 mmol/L (ref 20–29)
Calcium: 9.4 mg/dL (ref 8.7–10.2)
Creatinine, Ser: 0.56 mg/dL — ABNORMAL LOW (ref 0.57–1.00)
GFR calc Af Amer: 128 mL/min/{1.73_m2} (ref >59)
GFR calc non Af Amer: 111 mL/min/{1.73_m2} (ref >59)
GLUCOSE: 211 mg/dL — AB (ref 65–99)
Globulin, Total: 2.9 g/dL (ref 1.5–4.5)
Potassium: 4.2 mmol/L (ref 3.5–5.2)
Sodium: 140 mmol/L (ref 134–144)
TOTAL PROTEIN: 7 g/dL (ref 6.0–8.5)

## 2017-12-30 LAB — TSH: TSH: 1.53 u[IU]/mL (ref 0.450–4.500)

## 2017-12-30 MED ORDER — LISINOPRIL 40 MG PO TABS: 40.0000 mg | ORAL_TABLET | Freq: Every day | ORAL | 2 refills | Status: DC

## 2017-12-30 MED ORDER — LISINOPRIL 20 MG PO TABS
40.0000 mg | ORAL_TABLET | Freq: Every day | ORAL | 0 refills | Status: DC
Start: 2017-12-30 — End: 2017-12-30

## 2017-12-30 MED ORDER — INSULIN GLARGINE 100 UNIT/ML SOLOSTAR PEN: 10.0000 [IU] | PEN_INJECTOR | Freq: Every day | SUBCUTANEOUS | 11 refills | Status: DC

## 2017-12-30 MED FILL — !LANTUS SOLOSTAR 100UNITS/M: 100 | 28 days supply | Qty: 3 | Fill #0

## 2017-12-30 NOTE — Telephone Encounter (Signed)
Medication sent to [facility] per patient request.

## 2017-12-30 NOTE — Addendum Note (Signed)
Addended by: Bing NeighborsHARRIS, Butch Otterson S on: 12/30/2017 06:58 PM   Modules accepted: Orders

## 2017-12-30 NOTE — Telephone Encounter (Signed)
Added refills to lisinopril

## 2018-01-09 ENCOUNTER — Telehealth: Payer: Self-pay

## 2018-01-09 ENCOUNTER — Ambulatory Visit: Payer: Self-pay | Admitting: Adult Health

## 2018-01-09 NOTE — Telephone Encounter (Signed)
Patient no show for appt today. 

## 2018-01-30 ENCOUNTER — Encounter: Payer: Self-pay | Admitting: Family Medicine

## 2018-01-30 ENCOUNTER — Ambulatory Visit (INDEPENDENT_AMBULATORY_CARE_PROVIDER_SITE_OTHER): Payer: Self-pay | Admitting: Family Medicine

## 2018-01-30 VITALS — BP 110/66 | HR 86 | Temp 98.0°F | Resp 12 | Ht 64.0 in | Wt 155.0 lb

## 2018-01-30 DIAGNOSIS — K219 Gastro-esophageal reflux disease without esophagitis: Secondary | ICD-10-CM

## 2018-01-30 DIAGNOSIS — F17200 Nicotine dependence, unspecified, uncomplicated: Secondary | ICD-10-CM

## 2018-01-30 DIAGNOSIS — Z8673 Personal history of transient ischemic attack (TIA), and cerebral infarction without residual deficits: Secondary | ICD-10-CM

## 2018-01-30 DIAGNOSIS — I10 Essential (primary) hypertension: Secondary | ICD-10-CM

## 2018-01-30 DIAGNOSIS — E118 Type 2 diabetes mellitus with unspecified complications: Secondary | ICD-10-CM

## 2018-01-30 LAB — POCT URINALYSIS DIP (MANUAL ENTRY)
Bilirubin, UA: NEGATIVE
Blood, UA: NEGATIVE
GLUCOSE UA: NEGATIVE mg/dL
Ketones, POC UA: NEGATIVE mg/dL
LEUKOCYTES UA: NEGATIVE
NITRITE UA: NEGATIVE
PH UA: 5 (ref 5.0–8.0)
Protein Ur, POC: NEGATIVE mg/dL
Spec Grav, UA: 1.02 (ref 1.010–1.025)
Urobilinogen, UA: 1 E.U./dL

## 2018-01-30 LAB — POCT GLYCOSYLATED HEMOGLOBIN (HGB A1C): Hemoglobin A1C: 11.6

## 2018-01-30 MED ORDER — RANITIDINE HCL 150 MG PO TABS: 150.0000 mg | ORAL_TABLET | Freq: Two times a day (BID) | ORAL | 0 refills | Status: DC

## 2018-01-30 MED ORDER — INSULIN GLARGINE 100 UNIT/ML ~~LOC~~ SOLN
20.0000 [IU] | Freq: Every day | SUBCUTANEOUS | 11 refills | Status: DC
Start: 2018-01-30 — End: 2020-08-09

## 2018-01-30 MED ORDER — INSULIN SYRINGES (DISPOSABLE) U-100 0.3 ML MISC: 1.0000 | Freq: Every day | 11 refills | Status: DC

## 2018-01-30 NOTE — Patient Instructions (Signed)
Your hemoglobin A1c is 11.6, which is above goal.  Will increase Lantus to 20 units at bedtime.  [facility].  We will follow-up in 6 weeks.  Continue a carbohydrate modify diet divided over 5-6 small meals throughout the day.  Also, increase daily activity.  Increase water intake to about 3-4 bottles per day. Goal for your hemoglobin A1c is less than 7. Your blood pressure is at goal on today, no medication changes warranted.  - Continue medication, monitor blood pressure at home. Continue DASH diet. Reminder to go to the ER if any CP, SOB, nausea, dizziness, severe HA, changes vision/speech, left arm numbness and tingling and jaw pain.

## 2018-01-30 NOTE — Progress Notes (Signed)
Subjective:    Patient ID: Misty Mccullough, female    DOB: 05-Feb-1971, 47 y.o.   MRN: [id number]  HPI Misty Mccullough, a 47 year old female with a history of type 2 diabetes, tobacco dependence, hypertension, hyperlipidemia, and GERD presents for a follow up of chronic conditions.  Patient says that she is taking all prescribed medications consisently. Patient was recently admitted to inpatient services on 12/16/2017 and was found to have a right thaamic lucunar infarct. Patient was also found to have new onset type 2 diabetes mellitus during that time. Previous hemoglobin a1C was 12.1. She has been checking CBGSs frequently and following a carbohydrate modified diet. Misty Mccullough has not been exercising routinely. She is active in her home. Patient did not bring glucometer for review. Patient is a chronic everyday tobacco user. She has attempted to quit smoking without success.  Patient denies deficits pertaining to CVA. She has not scheduled follow up with neurology due to financial and insurance constraints. . She denies blurred vision, facial asymetry dizziness, weakness, or memory deficits.   Past Medical History:  Diagnosis Date  . Diabetes mellitus without complication (HCC)   . Gallstones   . Heavy cigarette smoker   . Hypercholesteremia   . Hypertension    Social History   Socioeconomic History  . Marital status: Married    Spouse name: Not on file  . Number of children: Not on file  . Years of education: Not on file  . Highest education level: Not on file  Occupational History  . Not on file  Social Needs  . Financial resource strain: Not on file  . Food insecurity:    Worry: Not on file    Inability: Not on file  . Transportation needs:    Medical: Not on file    Non-medical: Not on file  Tobacco Use  . Smoking status: Current Every Day Smoker    Packs/day: 1.50  . Smokeless tobacco: Never Used  . Tobacco comment: Since age 40.  Substance and Sexual Activity  . Alcohol use:  Yes    Comment: Occasional  . Drug use: No  . Sexual activity: Not on file  Lifestyle  . Physical activity:    Days per week: Not on file    Minutes per session: Not on file  . Stress: Not on file  Relationships  . Social connections:    Talks on phone: Not on file    Gets together: Not on file    Attends religious service: Not on file    Active member of club or organization: Not on file    Attends meetings of clubs or organizations: Not on file    Relationship status: Not on file  . Intimate partner violence:    Fear of current or ex partner: Not on file    Emotionally abused: Not on file    Physically abused: Not on file    Forced sexual activity: Not on file  Other Topics Concern  . Not on file  Social History Narrative  . Not on file    Review of Systems  Constitutional: Negative.   HENT: Negative.   Eyes: Negative.   Respiratory: Negative.   Cardiovascular: Negative.   Gastrointestinal: Negative.   Endocrine: Negative for polydipsia, polyphagia and polyuria.  Genitourinary: Negative.   Musculoskeletal: Negative.   Skin: Negative.   Neurological: Negative.   Hematological: Negative.   Psychiatric/Behavioral: Negative.        Objective:   Physical Exam  Constitutional: She is oriented to person, place, and time. She appears well-developed and well-nourished.  HENT:  Head: Normocephalic and atraumatic.  Right Ear: External ear normal.  Nose: Nose normal.  Mouth/Throat: Oropharynx is clear and moist.  Eyes: Pupils are equal, round, and reactive to light.  Neck: Normal range of motion. Neck supple.  Cardiovascular: Normal rate, regular rhythm, normal heart sounds and intact distal pulses.  Pulmonary/Chest: Effort normal and breath sounds normal.  Abdominal: Soft. Bowel sounds are normal.  Neurological: She is alert and oriented to person, place, and time. She has normal reflexes.  Skin: Skin is warm and dry.  Psychiatric: She has a normal mood and affect.  Her behavior is normal. Judgment and thought content normal.         BP 110/66 (BP Location: Right Arm, Patient Position: Sitting, Cuff Size: Small)   Pulse 86   Temp 98 F (36.7 C) (Oral)   Resp 12   Ht 5\' 4"  (1.626 m)   Wt 155 lb (70.3 kg)   LMP 12/02/2017   SpO2 100%   BMI 26.61 kg/m  Assessment & Plan:  1. Diabetes mellitus with complication (HCC) Hemoglobin a1C has improved to 11.6. Will increase Lantus to 20 units at bedtime. Hemoglobin a1C is above goal.  Goal is < 7. Discussed the importance of following a carbohydrate modified diet and taking medications consistently in order to achieve positive outcomes.  - POCT glycosylated hemoglobin (Hb A1C) - POCT urinalysis dipstick - insulin glargine (LANTUS) 100 UNIT/ML injection; Inject 0.2 mLs (20 Units total) into the skin at bedtime.  Dispense: 10 mL; Refill: 11 - Insulin Syringes, Disposable, U-100 0.3 ML MISC; 1 each by Does not apply route at bedtime.  Dispense: 100 each; Refill: 11  2. Gastroesophageal reflux disease without esophagitis - ranitidine (ZANTAC) 150 MG tablet; Take 1 tablet (150 mg total) by mouth 2 (two) times daily.  Dispense: 60 tablet; Refill: 0  3. Essential hypertension Blood pressure is at goal on current medication regimen. Goal is < 140/90. Will continue Lisinopril 40 mg daily. Monitor blood pressure at home. Continue DASH diet. Reminded to go to the ER if any CP, SOB, nausea, dizziness, severe HA, changes vision/speech, left arm numbness and tingling and jaw pain.     4. History of CVA (cerebrovascular accident) Continue high dose statin and ASA therapy. Will send a referral to neurology for further work up and evaluation as payer source becomes available. Patient was given financial assistance forms  5. Tobacco dependence Patient is in pre contemplative state. Smoking cessation instruction/counseling given:  counseled patient on the dangers of tobacco use, advised patient to stop smoking, and  reviewed strategies to maximize success     The patient was given clear instructions to go to ER or return to medical center if symptoms do not improve, worsen or new problems develop. The patient verbalized understanding.   RTC: Follow up in 6 weeks due to DMII   Nolon NationsLachina Moore Hollis  MSN, FNP-C [facility] [address]  [place], [place] [id number] [phone]

## 2018-02-05 DIAGNOSIS — Z8673 Personal history of transient ischemic attack (TIA), and cerebral infarction without residual deficits: Secondary | ICD-10-CM | POA: Insufficient documentation

## 2018-02-05 DIAGNOSIS — F17200 Nicotine dependence, unspecified, uncomplicated: Secondary | ICD-10-CM | POA: Insufficient documentation

## 2018-03-13 ENCOUNTER — Ambulatory Visit: Payer: Self-pay | Admitting: Family Medicine

## 2018-08-01 IMAGING — US US ABDOMEN COMPLETE
1 series · 13 of 25 positions shown · non-contrast
Comparison: None.

CLINICAL DATA: Abdominal pain for 3 months.

EXAM:
ABDOMEN ULTRASOUND COMPLETE

[Series 1: us abdomen complete · 0.25mm/px · 13 of 110 slices shown]
[im 1/110]
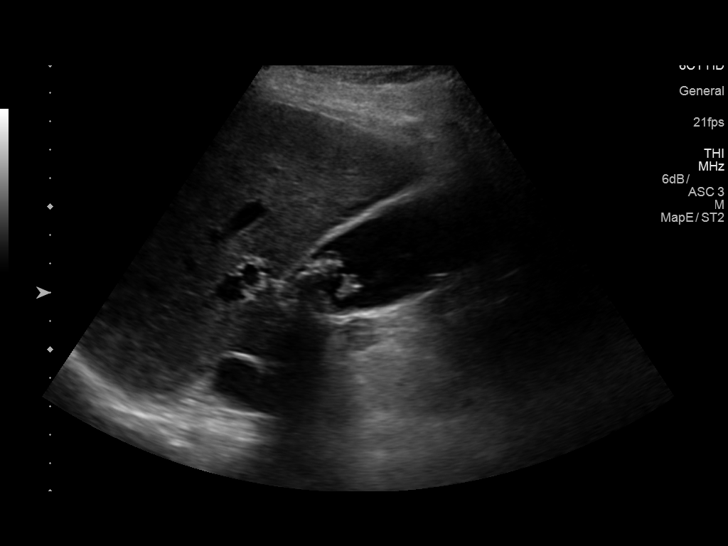
[im 10/110]
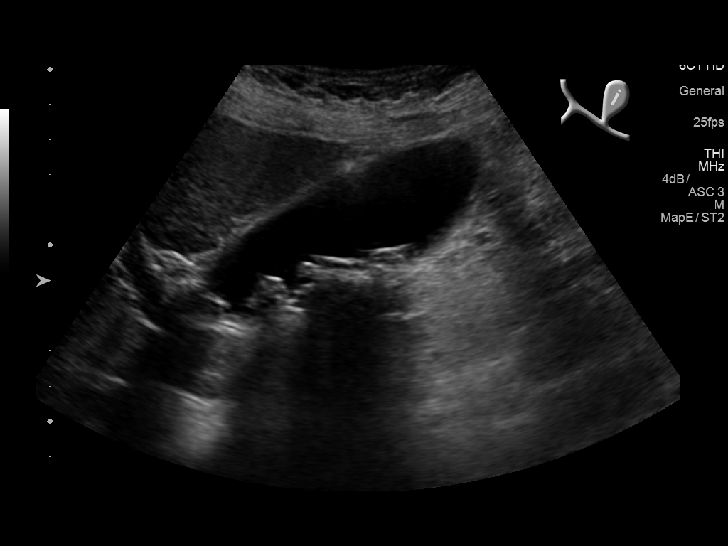
[im 19/110]
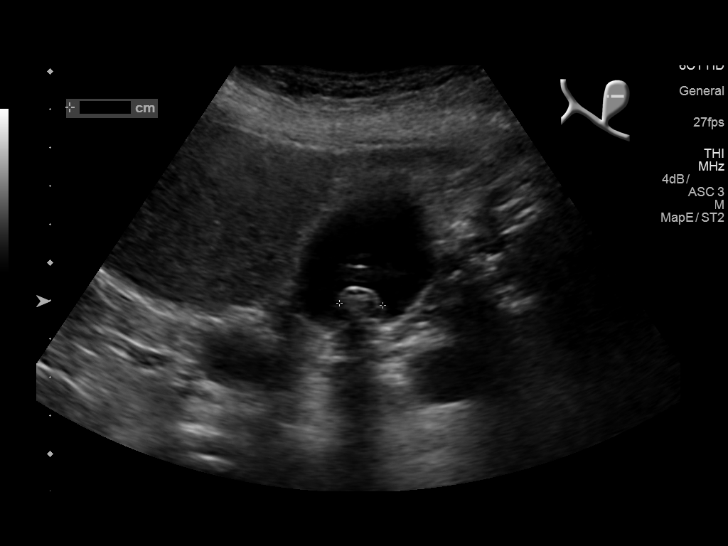
[im 28/110]
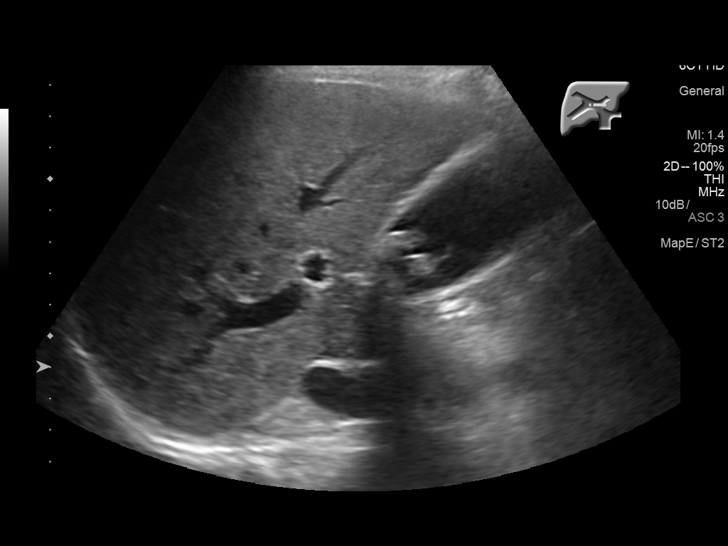
[im 37/110]
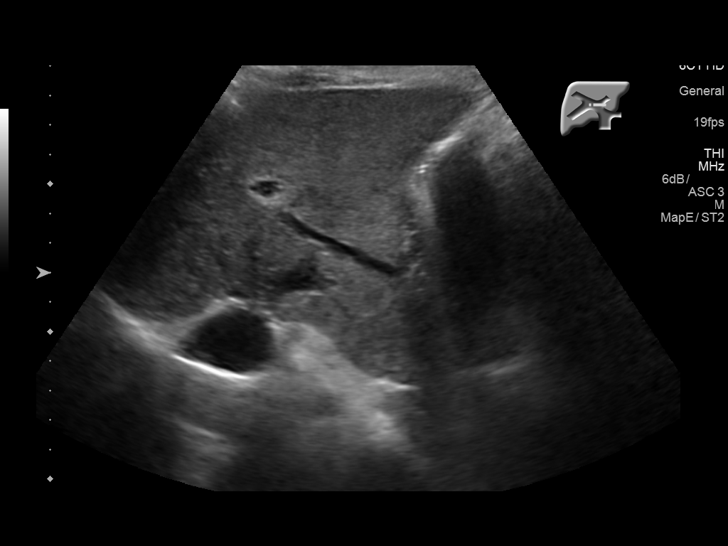
[im 46/110]
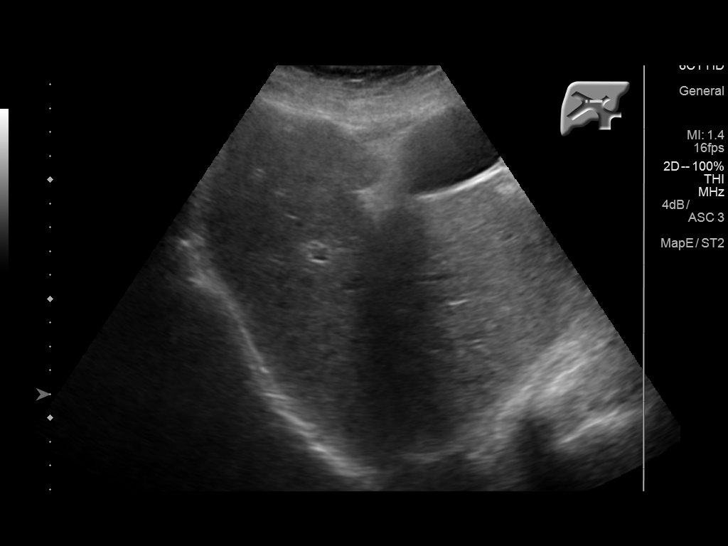
[im 55/110]
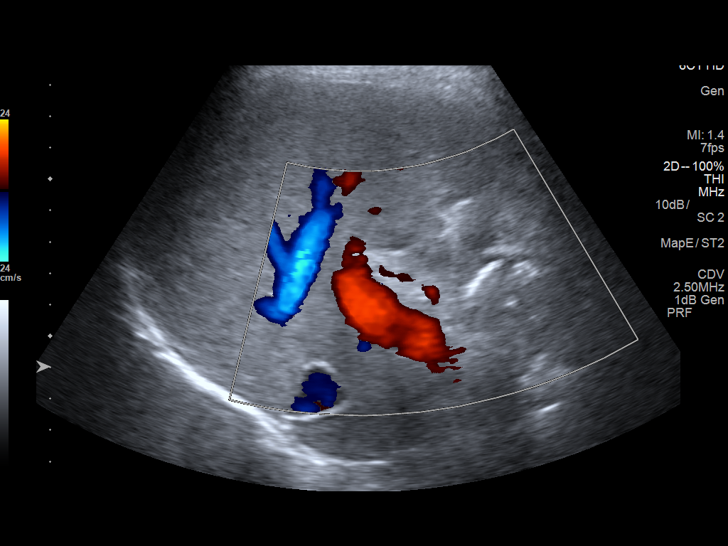
[im 64/110]
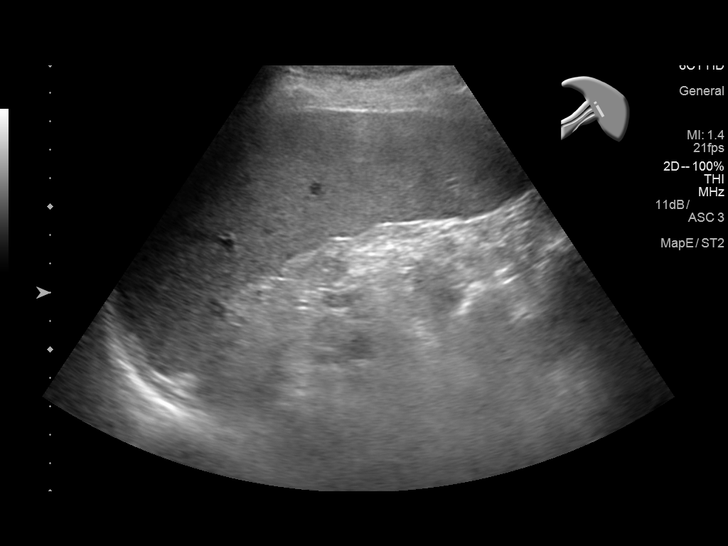
[im 73/110]
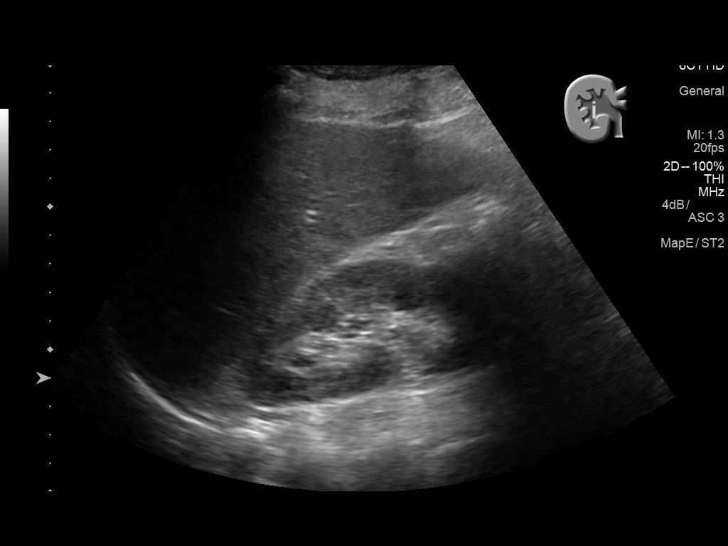
[im 82/110]
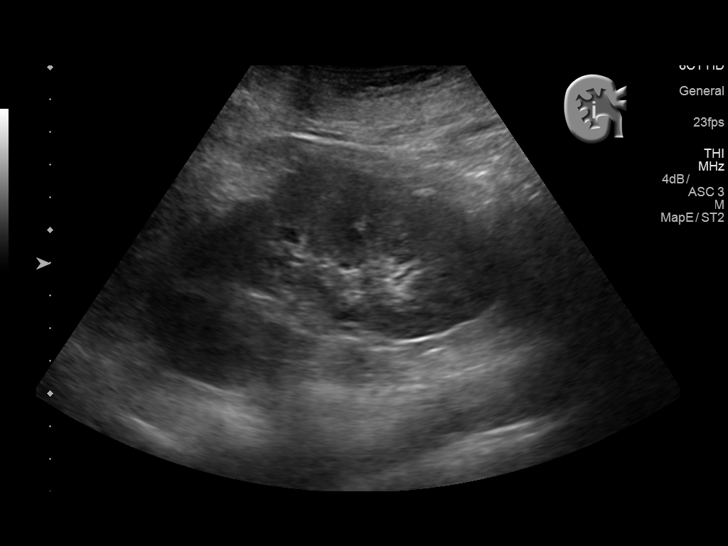
[im 91/110]
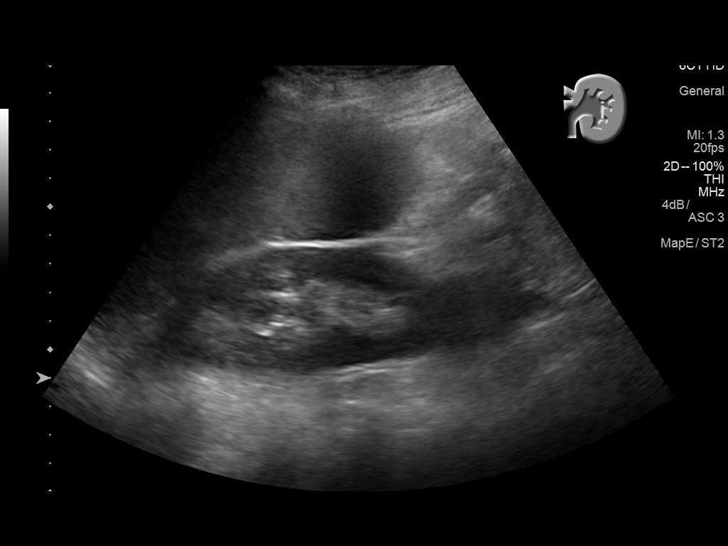
[im 100/110]
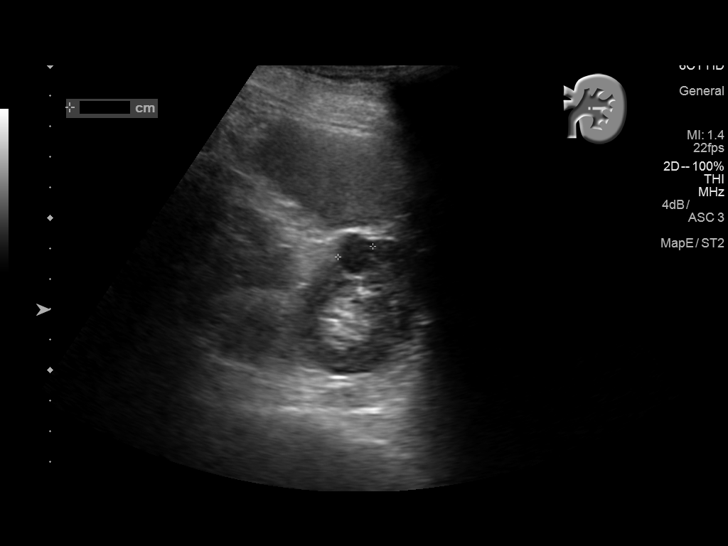
[im 110/110]
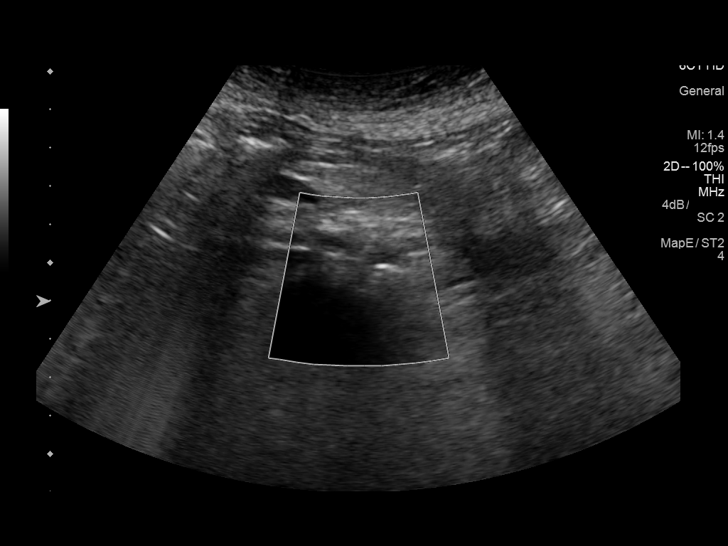

[13 of 25 positions shown; findings below may reference images not displayed]

FINDINGS: Gallbladder: There are numerous layering gallbladder stones, the
largest measuring 1.6 cm. The gallbladder wall measures 2.7 mm,
which is upper limits of normal in transverse diameter. No
sonographic Murphy sign noted by sonographer.

Common bile duct: Diameter: 2.1 mm

Liver: No focal lesion identified. Within normal limits in
parenchymal echogenicity.

IVC: No abnormality visualized.

Pancreas: Visualized portion unremarkable.

Spleen: Size and appearance within normal limits.

Right Kidney: Length: 11.4 cm. Echogenicity within normal limits. No
mass or hydronephrosis visualized.

Left Kidney: Length: 13.8 cm. Echogenicity within normal limits. No
mass or hydronephrosis visualized. Benign-appearing 1.4 cm
circumscribed hypoechoic mass noted in the midpole region.

Abdominal aorta: No aneurysm visualized.

Other findings: None.
IMPRESSION: Cholelithiasis with borderline thickness of the gallbladder wall of
2.7 mm.

Normal common bile duct diameter.

Probably benign 1.4 cm left renal cyst. Follow-up with renal
ultrasound in 6-12 months is recommended to assure stability.

## 2018-08-13 ENCOUNTER — Telehealth: Payer: Self-pay

## 2018-08-13 DIAGNOSIS — E118 Type 2 diabetes mellitus with unspecified complications: Secondary | ICD-10-CM

## 2018-08-13 MED ORDER — METFORMIN HCL 1000 MG PO TABS: 1000.0000 mg | ORAL_TABLET | Freq: Two times a day (BID) | ORAL | 0 refills | Status: DC

## 2018-08-13 NOTE — Telephone Encounter (Signed)
7 day supply of medication has been sent in until patient comes in for appointment

## 2018-08-17 ENCOUNTER — Ambulatory Visit (INDEPENDENT_AMBULATORY_CARE_PROVIDER_SITE_OTHER): Payer: Self-pay | Admitting: Family Medicine

## 2018-08-17 ENCOUNTER — Encounter: Payer: Self-pay | Admitting: Family Medicine

## 2018-08-17 VITALS — BP 142/78 | HR 80 | Temp 98.1°F | Ht 64.0 in | Wt 163.4 lb

## 2018-08-17 DIAGNOSIS — G4700 Insomnia, unspecified: Secondary | ICD-10-CM

## 2018-08-17 DIAGNOSIS — E119 Type 2 diabetes mellitus without complications: Secondary | ICD-10-CM

## 2018-08-17 DIAGNOSIS — Z23 Encounter for immunization: Secondary | ICD-10-CM

## 2018-08-17 DIAGNOSIS — Z09 Encounter for follow-up examination after completed treatment for conditions other than malignant neoplasm: Secondary | ICD-10-CM

## 2018-08-17 DIAGNOSIS — IMO0001 Reserved for inherently not codable concepts without codable children: Secondary | ICD-10-CM

## 2018-08-17 DIAGNOSIS — K219 Gastro-esophageal reflux disease without esophagitis: Secondary | ICD-10-CM

## 2018-08-17 DIAGNOSIS — F17200 Nicotine dependence, unspecified, uncomplicated: Secondary | ICD-10-CM

## 2018-08-17 DIAGNOSIS — E1165 Type 2 diabetes mellitus with hyperglycemia: Secondary | ICD-10-CM

## 2018-08-17 LAB — POCT URINALYSIS DIP (MANUAL ENTRY)
Bilirubin, UA: NEGATIVE
Blood, UA: NEGATIVE
Glucose, UA: NEGATIVE mg/dL
Ketones, POC UA: NEGATIVE mg/dL
Leukocytes, UA: NEGATIVE
Nitrite, UA: NEGATIVE
Protein Ur, POC: NEGATIVE mg/dL
Spec Grav, UA: <=1.005 — AB (ref 1.010–1.025)
Urobilinogen, UA: 0.2 E.U./dL
pH, UA: 5.5 (ref 5.0–8.0)

## 2018-08-17 LAB — POCT GLYCOSYLATED HEMOGLOBIN (HGB A1C): Hemoglobin A1C: 9.3 % — AB (ref 4.0–5.6)

## 2018-08-17 MED ORDER — ATORVASTATIN CALCIUM 10 MG PO TABS: 10.0000 mg | ORAL_TABLET | Freq: Every day | ORAL | 1 refills | Status: DC

## 2018-08-17 MED ORDER — OMEPRAZOLE 20 MG PO CPDR: 20.0000 mg | DELAYED_RELEASE_CAPSULE | Freq: Every day | ORAL | 3 refills | Status: DC

## 2018-08-17 MED ORDER — TRAZODONE HCL 50 MG PO TABS: 25.0000 mg | ORAL_TABLET | Freq: Every evening | ORAL | 3 refills | Status: DC | PRN

## 2018-08-17 MED ORDER — METFORMIN HCL 1000 MG PO TABS: 1000.0000 mg | ORAL_TABLET | Freq: Two times a day (BID) | ORAL | 2 refills | Status: DC

## 2018-08-17 NOTE — Patient Instructions (Addendum)
Trazodone tablets What is this medicine? TRAZODONE (TRAZ oh done) is used to treat depression. This medicine may be used for other purposes; ask your health care provider or pharmacist if you have questions. COMMON BRAND NAME(S): Desyrel What should I tell my health care provider before I take this medicine? They need to know if you have any of these conditions: -attempted suicide or thinking about it -bipolar disorder -bleeding problems -glaucoma -heart disease, or previous heart attack -irregular heart beat -kidney or liver disease -low levels of sodium in the blood -an unusual or allergic reaction to trazodone, other medicines, foods, dyes or preservatives -pregnant or trying to get pregnant -breast-feeding How should I use this medicine? Take this medicine by mouth with a glass of water. Follow the directions on the prescription label. Take this medicine shortly after a meal or a light snack. Take your medicine at regular intervals. Do not take your medicine more often than directed. Do not stop taking this medicine suddenly except upon the advice of your doctor. Stopping this medicine too quickly may cause serious side effects or your condition may worsen. A special MedGuide will be given to you by the pharmacist with each prescription and refill. Be sure to read this information carefully each time. Talk to your pediatrician regarding the use of this medicine in children. Special care may be needed. Overdosage: If you think you have taken too much of this medicine contact a poison control center or emergency room at once. NOTE: This medicine is only for you. Do not share this medicine with others. What if I miss a dose? If you miss a dose, take it as soon as you can. If it is almost time for your next dose, take only that dose. Do not take double or extra doses. What may interact with this medicine? Do not take this medicine with any of the following medications: -certain medicines  for fungal infections like fluconazole, itraconazole, ketoconazole, posaconazole, voriconazole -cisapride -dofetilide -dronedarone -linezolid -MAOIs like Carbex, Eldepryl, Marplan, Nardil, and Parnate -mesoridazine -methylene blue (injected into a vein) -pimozide -saquinavir -thioridazine -ziprasidone This medicine may also interact with the following medications: -alcohol -antiviral medicines for HIV or AIDS -aspirin and aspirin-like medicines -barbiturates like phenobarbital -certain medicines for blood pressure, heart disease, irregular heart beat -certain medicines for depression, anxiety, or psychotic disturbances -certain medicines for migraine headache like almotriptan, eletriptan, frovatriptan, naratriptan, rizatriptan, sumatriptan, zolmitriptan -certain medicines for seizures like carbamazepine and phenytoin -certain medicines for sleep -certain medicines that treat or prevent blood clots like dalteparin, enoxaparin, warfarin -digoxin -fentanyl -lithium -NSAIDS, medicines for pain and inflammation, like ibuprofen or naproxen -other medicines that prolong the QT interval (cause an abnormal heart rhythm) -rasagiline -supplements like St. John's wort, kava kava, valerian -tramadol -tryptophan This list may not describe all possible interactions. Give your health care provider a list of all the medicines, herbs, non-prescription drugs, or dietary supplements you use. Also tell them if you smoke, drink alcohol, or use illegal drugs. Some items may interact with your medicine. What should I watch for while using this medicine? Tell your doctor if your symptoms do not get better or if they get worse. Visit your doctor or health care professional for regular checks on your progress. Because it may take several weeks to see the full effects of this medicine, it is important to continue your treatment as prescribed by your doctor. Patients and their families should watch out for new  or worsening thoughts of suicide or depression. Also   watch out for sudden changes in feelings such as feeling anxious, agitated, panicky, irritable, hostile, aggressive, impulsive, severely restless, overly excited and hyperactive, or not being able to sleep. If this happens, especially at the beginning of treatment or after a change in dose, call your health care professional. Misty Mccullough may get drowsy or dizzy. Do not drive, use machinery, or do anything that needs mental alertness until you know how this medicine affects you. Do not stand or sit up quickly, especially if you are an older patient. This reduces the risk of dizzy or fainting spells. Alcohol may interfere with the effect of this medicine. Avoid alcoholic drinks. This medicine may cause dry eyes and blurred vision. If you wear contact lenses you may feel some discomfort. Lubricating drops may help. See your eye doctor if the problem does not go away or is severe. Your mouth may get dry. Chewing sugarless gum, sucking hard candy and drinking plenty of water may help. Contact your doctor if the problem does not go away or is severe. What side effects may I notice from receiving this medicine? Side effects that you should report to your doctor or health care professional as soon as possible: -allergic reactions like skin rash, itching or hives, swelling of the face, lips, or tongue -elevated mood, decreased need for sleep, racing thoughts, impulsive behavior -confusion -fast, irregular heartbeat -feeling faint or lightheaded, falls -feeling agitated, angry, or irritable -loss of balance or coordination -painful or prolonged erections -restlessness, pacing, inability to keep still -suicidal thoughts or other mood changes -tremors -trouble sleeping -seizures -unusual bleeding or bruising Side effects that usually do not require medical attention (report to your doctor or health care professional if they continue or are bothersome): -change in  sex drive or performance -change in appetite or weight -constipation -headache -muscle aches or pains -nausea This list may not describe all possible side effects. Call your doctor for medical advice about side effects. You may report side effects to FDA at 1-800-FDA-1088. Where should I keep my medicine? Keep out of the reach of children. Store at room temperature between 15 and 30 degrees C (59 to 86 degrees F). Protect from light. Keep container tightly closed. Throw away any unused medicine after the expiration date. NOTE: This sheet is a summary. It may not cover all possible information. If you have questions about this medicine, talk to your doctor, pharmacist, or health care provider.  2018 Elsevier/Gold Standard (2016-02-22 16:57:05)     Atorvastatin tablets What is this medicine? ATORVASTATIN (a TORE va sta tin) is known as a HMG-CoA reductase inhibitor or 'statin'. It lowers the level of cholesterol and triglycerides in the blood. This drug may also reduce the risk of heart attack, stroke, or other health problems in patients with risk factors for heart disease. Diet and lifestyle changes are often used with this drug. This medicine may be used for other purposes; ask your health care provider or pharmacist if you have questions. COMMON BRAND NAME(S): Lipitor What should I tell my health care provider before I take this medicine? They need to know if you have any of these conditions: -frequently drink alcoholic beverages -history of stroke, TIA -kidney disease -liver disease -muscle aches or weakness -other medical condition -an unusual or allergic reaction to atorvastatin, other medicines, foods, dyes, or preservatives -pregnant or trying to get pregnant -breast-feeding How should I use this medicine? Take this medicine by mouth with a glass of water. Follow the directions on the prescription label. You  can take this medicine with or without food. Take your doses at  regular intervals. Do not take your medicine more often than directed. Talk to your pediatrician regarding the use of this medicine in children. While this drug may be prescribed for children as young as 81 years old for selected conditions, precautions do apply. Overdosage: If you think you have taken too much of this medicine contact a poison control center or emergency room at once. NOTE: This medicine is only for you. Do not share this medicine with others. What if I miss a dose? If you miss a dose, take it as soon as you can. If it is almost time for your next dose, take only that dose. Do not take double or extra doses. What may interact with this medicine? Do not take this medicine with any of the following medications: -red yeast rice -telaprevir -telithromycin -voriconazole This medicine may also interact with the following medications: -alcohol -antiviral medicines for HIV or AIDS -boceprevir -certain antibiotics like clarithromycin, erythromycin, troleandomycin -certain medicines for cholesterol like fenofibrate or gemfibrozil -cimetidine -clarithromycin -colchicine -cyclosporine -digoxin -female hormones, like estrogens or progestins and birth control pills -grapefruit juice -medicines for fungal infections like fluconazole, itraconazole, ketoconazole -niacin -rifampin -spironolactone This list may not describe all possible interactions. Give your health care provider a list of all the medicines, herbs, non-prescription drugs, or dietary supplements you use. Also tell them if you smoke, drink alcohol, or use illegal drugs. Some items may interact with your medicine. What should I watch for while using this medicine? Visit your doctor or health care professional for regular check-ups. You may need regular tests to make sure your liver is working properly. Tell your doctor or health care professional right away if you get any unexplained muscle pain, tenderness, or weakness,  especially if you also have a fever and tiredness. Your doctor or health care professional may tell you to stop taking this medicine if you develop muscle problems. If your muscle problems do not go away after stopping this medicine, contact your health care professional. This drug is only part of a total heart-health program. Your doctor or a dietician can suggest a low-cholesterol and low-fat diet to help. Avoid alcohol and smoking, and keep a proper exercise schedule. Do not use this drug if you are pregnant or breast-feeding. Serious side effects to an unborn child or to an infant are possible. Talk to your doctor or pharmacist for more information. This medicine may affect blood sugar levels. If you have diabetes, check with your doctor or health care professional before you change your diet or the dose of your diabetic medicine. If you are going to have surgery tell your health care professional that you are taking this drug. What side effects may I notice from receiving this medicine? Side effects that you should report to your doctor or health care professional as soon as possible: -allergic reactions like skin rash, itching or hives, swelling of the face, lips, or tongue -dark urine -fever -joint pain -muscle cramps, pain -redness, blistering, peeling or loosening of the skin, including inside the mouth -trouble passing urine or change in the amount of urine -unusually weak or tired -yellowing of eyes or skin Side effects that usually do not require medical attention (report to your doctor or health care professional if they continue or are bothersome): -constipation -heartburn -stomach gas, pain, upset This list may not describe all possible side effects. Call your doctor for medical advice about side effects. You  may report side effects to FDA at 1-800-FDA-1088. Where should I keep my medicine? Keep out of the reach of children. Store at room temperature between 20 to 25 degrees C  (68 to 77 degrees F). Throw away any unused medicine after the expiration date. NOTE: This sheet is a summary. It may not cover all possible information. If you have questions about this medicine, talk to your doctor, pharmacist, or health care provider.  2018 Elsevier/Gold Standard (2011-08-13 16:10:96)   Heart-Healthy Eating Plan Heart-healthy meal planning includes:  Limiting unhealthy fats.  Increasing healthy fats.  Making other small dietary changes.  You may need to talk with your doctor or a diet specialist (dietitian) to create an eating plan that is right for you. What types of fat should I choose?  Choose healthy fats. These include olive oil and canola oil, flaxseeds, walnuts, almonds, and seeds.  Eat more omega-3 fats. These include salmon, mackerel, sardines, tuna, flaxseed oil, and ground flaxseeds. Try to eat fish at least twice each week.  Limit saturated fats. ? Saturated fats are often found in animal products, such as meats, butter, and cream. ? Plant sources of saturated fats include palm oil, palm kernel oil, and coconut oil.  Avoid foods with partially hydrogenated oils in them. These include stick margarine, some tub margarines, cookies, crackers, and other baked goods. These contain trans fats. What general guidelines do I need to follow?  Check food labels carefully. Identify foods with trans fats or high amounts of saturated fat.  Fill one half of your plate with vegetables and green salads. Eat 4-5 servings of vegetables per day. A serving of vegetables is: ? 1 cup of raw leafy vegetables. ?  cup of raw or cooked cut-up vegetables. ?  cup of vegetable juice.  Fill one fourth of your plate with whole grains. Look for the word "whole" as the first word in the ingredient list.  Fill one fourth of your plate with lean protein foods.  Eat 4-5 servings of fruit per day. A serving of fruit is: ? One medium whole fruit. ?  cup of dried fruit. ?  cup  of fresh, frozen, or canned fruit. ?  cup of 100% fruit juice.  Eat more foods that contain soluble fiber. These include apples, broccoli, carrots, beans, peas, and barley. Try to get 20-30 g of fiber per day.  Eat more home-cooked food. Eat less restaurant, buffet, and fast food.  Limit or avoid alcohol.  Limit foods high in starch and sugar.  Avoid fried foods.  Avoid frying your food. Try baking, boiling, grilling, or broiling it instead. You can also reduce fat by: ? Removing the skin from poultry. ? Removing all visible fats from meats. ? Skimming the fat off of stews, soups, and gravies before serving them. ? Steaming vegetables in water or broth.  Lose weight if you are overweight.  Eat 4-5 servings of nuts, legumes, and seeds per week: ? One serving of dried beans or legumes equals  cup after being cooked. ? One serving of nuts equals 1 ounces. ? One serving of seeds equals  ounce or one tablespoon.  You may need to keep track of how much salt or sodium you eat. This is especially true if you have high blood pressure. Talk with your doctor or dietitian to get more information. What foods can I eat? Grains Breads, including [place], white, pita, wheat, raisin, rye, oatmeal, and [place]. Tortillas that are neither fried nor made with lard  or trans fat. Low-fat rolls, including hotdog and hamburger buns and English muffins. Biscuits. Muffins. Waffles. Pancakes. Light popcorn. Whole-grain cereals. Flatbread. Melba toast. Pretzels. Breadsticks. Rusks. Low-fat snacks. Low-fat crackers, including oyster, saltine, matzo, graham, animal, and rye. Rice and pasta, including brown rice and pastas that are made with whole wheat. Vegetables All vegetables. Fruits All fruits, but limit coconut. Meats and Other Protein Sources Lean, well-trimmed beef, veal, pork, and lamb. Chicken and [place] without skin. All fish and shellfish. Wild duck, rabbit, pheasant, and venison. Egg whites or  low-cholesterol egg substitutes. Dried beans, peas, lentils, and tofu. Seeds and most nuts. Dairy Low-fat or nonfat cheeses, including ricotta, string, and mozzarella. Skim or 1% milk that is liquid, powdered, or evaporated. Buttermilk that is made with low-fat milk. Nonfat or low-fat yogurt. Beverages Mineral water. Diet carbonated beverages. Sweets and Desserts Sherbets and fruit ices. Honey, jam, marmalade, jelly, and syrups. Meringues and gelatins. Pure sugar candy, such as hard candy, jelly beans, gumdrops, mints, marshmallows, and small amounts of dark chocolate. [employer]. Eat all sweets and desserts in moderation. Fats and Oils Nonhydrogenated (trans-free) margarines. Vegetable oils, including soybean, sesame, sunflower, olive, peanut, safflower, corn, canola, and cottonseed. Salad dressings or mayonnaise made with a vegetable oil. Limit added fats and oils that you use for cooking, baking, salads, and as spreads. Other Cocoa powder. Coffee and tea. All seasonings and condiments. The items listed above may not be a complete list of recommended foods or beverages. Contact your dietitian for more options. What foods are not recommended? Grains Breads that are made with saturated or trans fats, oils, or whole milk. Croissants. Butter rolls. Cheese breads. Sweet rolls. Donuts. Buttered popcorn. Chow mein noodles. High-fat crackers, such as cheese or butter crackers. Meats and Other Protein Sources Fatty meats, such as hotdogs, short ribs, sausage, spareribs, bacon, rib eye roast or steak, and mutton. High-fat deli meats, such as salami and bologna. Caviar. Domestic duck and goose. Organ meats, such as kidney, liver, sweetbreads, and heart. Dairy Cream, sour cream, cream cheese, and creamed cottage cheese. Whole-milk cheeses, including blue (bleu), [address], [place], [place], [address], [place], [address], cheddar, [place], and [place]. Whole or 2% milk that is liquid, evaporated, or  condensed. Whole buttermilk. Cream sauce or high-fat cheese sauce. Yogurt that is made from whole milk. Beverages Regular sodas and juice drinks with added sugar. Sweets and Desserts Frosting. Pudding. Cookies. Cakes other than angel food cake. Candy that has milk chocolate or white chocolate, hydrogenated fat, butter, coconut, or unknown ingredients. Buttered syrups. Full-fat ice cream or ice cream drinks. Fats and Oils Gravy that has suet, meat fat, or shortening. Cocoa butter, hydrogenated oils, palm oil, coconut oil, palm kernel oil. These can often be found in baked products, candy, fried foods, nondairy creamers, and whipped toppings. Solid fats and shortenings, including bacon fat, salt pork, lard, and butter. Nondairy cream substitutes, such as coffee creamers and sour cream substitutes. Salad dressings that are made of unknown oils, cheese, or sour cream. The items listed above may not be a complete list of foods and beverages to avoid. Contact your dietitian for more information. This information is not intended to replace advice given to you by your health care provider. Make sure you discuss any questions you have with your health care provider. Document Released: 03/24/2012 Document Revised: 02/29/2016 Document Reviewed: 03/17/2014 Elsevier Interactive Patient Education  2018 [employer]  DASH Eating Plan DASH stands for "Dietary Approaches to Stop Hypertension." The DASH eating plan is  a healthy eating plan that has been shown to reduce high blood pressure (hypertension). It may also reduce your risk for type 2 diabetes, heart disease, and stroke. The DASH eating plan may also help with weight loss. What are tips for following this plan? General guidelines  Avoid eating more than 2,300 mg (milligrams) of salt (sodium) a day. If you have hypertension, you may need to reduce your sodium intake to 1,500 mg a day.  Limit alcohol intake to no more than 1 drink a day for nonpregnant  women and 2 drinks a day for men. One drink equals 12 oz of beer, 5 oz of wine, or 1 oz of hard liquor.  Work with your health care provider to maintain a healthy body weight or to lose weight. Ask what an ideal weight is for you.  Get at least 30 minutes of exercise that causes your heart to beat faster (aerobic exercise) most days of the week. Activities may include walking, swimming, or biking.  Work with your health care provider or diet and nutrition specialist (dietitian) to adjust your eating plan to your individual calorie needs. Reading food labels  Check food labels for the amount of sodium per serving. Choose foods with less than 5 percent of the Daily Value of sodium. Generally, foods with less than 300 mg of sodium per serving fit into this eating plan.  To find whole grains, look for the word "whole" as the first word in the ingredient list. Shopping  Buy products labeled as "low-sodium" or "no salt added."  Buy fresh foods. Avoid canned foods and premade or frozen meals. Cooking  Avoid adding salt when cooking. Use salt-free seasonings or herbs instead of table salt or sea salt. Check with your health care provider or pharmacist before using salt substitutes.  Do not fry foods. Cook foods using healthy methods such as baking, boiling, grilling, and broiling instead.  Cook with heart-healthy oils, such as olive, canola, soybean, or sunflower oil. Meal planning   Eat a balanced diet that includes: ? 5 or more servings of fruits and vegetables each day. At each meal, try to fill half of your plate with fruits and vegetables. ? Up to 6-8 servings of whole grains each day. ? Less than 6 oz of lean meat, poultry, or fish each day. A 3-oz serving of meat is about the same size as a deck of cards. One egg equals 1 oz. ? 2 servings of low-fat dairy each day. ? A serving of nuts, seeds, or beans 5 times each week. ? Heart-healthy fats. Healthy fats called Omega-3 fatty acids  are found in foods such as flaxseeds and [place] fish, like sardines, salmon, and mackerel.  Limit how much you eat of the following: ? Canned or prepackaged foods. ? Food that is high in trans fat, such as fried foods. ? Food that is high in saturated fat, such as fatty meat. ? Sweets, desserts, sugary drinks, and other foods with added sugar. ? Full-fat dairy products.  Do not salt foods before eating.  Try to eat at least 2 vegetarian meals each week.  Eat more home-cooked food and less restaurant, buffet, and fast food.  When eating at a restaurant, ask that your food be prepared with less salt or no salt, if possible. What foods are recommended? The items listed may not be a complete list. Talk with your dietitian about what dietary choices are best for you. Grains Whole-grain or whole-wheat bread. Whole-grain or whole-wheat pasta.  Brown rice. Orpah Cobb. Bulgur. Whole-grain and low-sodium cereals. Pita bread. Low-fat, low-sodium crackers. Whole-wheat flour tortillas. Vegetables Fresh or frozen vegetables (raw, steamed, roasted, or grilled). Low-sodium or reduced-sodium tomato and vegetable juice. Low-sodium or reduced-sodium tomato sauce and tomato paste. Low-sodium or reduced-sodium canned vegetables. Fruits All fresh, dried, or frozen fruit. Canned fruit in natural juice (without added sugar). Meat and other protein foods Skinless chicken or [place]. Ground chicken or [place]. Pork with fat trimmed off. Fish and seafood. Egg whites. Dried beans, peas, or lentils. Unsalted nuts, nut butters, and seeds. Unsalted canned beans. Lean cuts of beef with fat trimmed off. Low-sodium, lean deli meat. Dairy Low-fat (1%) or fat-free (skim) milk. Fat-free, low-fat, or reduced-fat cheeses. Nonfat, low-sodium ricotta or cottage cheese. Low-fat or nonfat yogurt. Low-fat, low-sodium cheese. Fats and oils Soft margarine without trans fats. Vegetable oil. Low-fat, reduced-fat, or light  mayonnaise and salad dressings (reduced-sodium). Canola, safflower, olive, soybean, and sunflower oils. Avocado. Seasoning and other foods Herbs. Spices. Seasoning mixes without salt. Unsalted popcorn and pretzels. Fat-free sweets. What foods are not recommended? The items listed may not be a complete list. Talk with your dietitian about what dietary choices are best for you. Grains Baked goods made with fat, such as croissants, muffins, or some breads. Dry pasta or rice meal packs. Vegetables Creamed or fried vegetables. Vegetables in a cheese sauce. Regular canned vegetables (not low-sodium or reduced-sodium). Regular canned tomato sauce and paste (not low-sodium or reduced-sodium). Regular tomato and vegetable juice (not low-sodium or reduced-sodium). Rosita Fire. Olives. Fruits Canned fruit in a light or heavy syrup. Fried fruit. Fruit in cream or butter sauce. Meat and other protein foods Fatty cuts of meat. Ribs. Fried meat. Tomasa Blase. Sausage. Bologna and other processed lunch meats. Salami. Fatback. Hotdogs. Bratwurst. Salted nuts and seeds. Canned beans with added salt. Canned or smoked fish. Whole eggs or egg yolks. Chicken or [place] with skin. Dairy Whole or 2% milk, cream, and half-and-half. Whole or full-fat cream cheese. Whole-fat or sweetened yogurt. Full-fat cheese. Nondairy creamers. Whipped toppings. Processed cheese and cheese spreads. Fats and oils Butter. Stick margarine. Lard. Shortening. Ghee. Bacon fat. Tropical oils, such as coconut, palm kernel, or palm oil. Seasoning and other foods Salted popcorn and pretzels. Onion salt, garlic salt, seasoned salt, table salt, and sea salt. Worcestershire sauce. Tartar sauce. Barbecue sauce. Teriyaki sauce. Soy sauce, including reduced-sodium. Steak sauce. Canned and packaged gravies. Fish sauce. Oyster sauce. Cocktail sauce. Horseradish that you find on the shelf. Ketchup. Mustard. Meat flavorings and tenderizers. Bouillon cubes. Hot sauce and  Tabasco sauce. Premade or packaged marinades. Premade or packaged taco seasonings. Relishes. Regular salad dressings. Where to find more information:  National Heart, Lung, and Blood Institute: PopSteam.is  American Heart Association: www.heart.org Summary  The DASH eating plan is a healthy eating plan that has been shown to reduce high blood pressure (hypertension). It may also reduce your risk for type 2 diabetes, heart disease, and stroke.  With the DASH eating plan, you should limit salt (sodium) intake to 2,300 mg a day. If you have hypertension, you may need to reduce your sodium intake to 1,500 mg a day.  When on the DASH eating plan, aim to eat more fresh fruits and vegetables, whole grains, lean proteins, low-fat dairy, and heart-healthy fats.  Work with your health care provider or diet and nutrition specialist (dietitian) to adjust your eating plan to your individual calorie needs. This information is not intended to replace advice given to you by your  health care provider. Make sure you discuss any questions you have with your health care provider. Document Released: 09/12/2011 Document Revised: 09/16/2016 Document Reviewed: 09/16/2016 Elsevier Interactive Patient Education  2018 Elsevier Inc.   Omeprazole capsules (sprinkle caps) - Rx What is this medicine? OMEPRAZOLE (oh ME pray zol) prevents the production of acid in the stomach. It is used to treat gastroesophageal reflux disease (GERD), ulcers, certain bacteria in the stomach, inflammation of the esophagus, and Zollinger-Ellison Syndrome. It is also used to treat other conditions that cause too much stomach acid. This medicine may be used for other purposes; ask your health care provider or pharmacist if you have questions. COMMON BRAND NAME(S): Prilosec What should I tell my health care provider before I take this medicine? They need to know if you have any of these conditions: -liver disease -low levels of  magnesium in the blood -lupus -an unusual or allergic reaction to omeprazole, other medicines, foods, dyes, or preservatives -pregnant or trying to get pregnant -breast-feeding How should I use this medicine? Take this medicine by mouth with a glass of water. Follow the directions on the prescription label. Do not crush, break or chew the capsules. They can be opened and the contents sprinkled on a small amount of applesauce or yogurt, given with fruit juices, or swallowed immediately with water. This medicine works best if taken on an empty stomach 30 to 60 minutes before breakfast. Take your doses at regular intervals. Do not take your medicine more often than directed. Talk to your pediatrician regarding the use of this medicine in children. Special care may be needed. Overdosage: If you think you have taken too much of this medicine contact a poison control center or emergency room at once. NOTE: This medicine is only for you. Do not share this medicine with others. What if I miss a dose? If you miss a dose, take it as soon as you can. If it is almost time for your next dose, take only that dose. Do not take double or extra doses. What may interact with this medicine? Do not take this medicine with any of the following medications: -atazanavir -clopidogrel -nelfinavir This medicine may also interact with the following medications: -ampicillin -certain medicines for anxiety or sleep -certain medicines that treat or prevent blood clots like warfarin -cyclosporine -diazepam -digoxin -disulfiram -diuretics -iron salts -methotrexate -mycophenolate mofetil -phenytoin -prescription medicine for fungal or yeast infection like itraconazole, ketoconazole, voriconazole -saquinavir -tacrolimus This list may not describe all possible interactions. Give your health care provider a list of all the medicines, herbs, non-prescription drugs, or dietary supplements you use. Also tell them if you  smoke, drink alcohol, or use illegal drugs. Some items may interact with your medicine. What should I watch for while using this medicine? It can take several days before your stomach pain gets better. Check with your doctor or health care professional if your condition does not start to get better, or if it gets worse. You may need blood work done while you are taking this medicine. What side effects may I notice from receiving this medicine? Side effects that you should report to your doctor or health care professional as soon as possible: -allergic reactions like skin rash, itching or hives, swelling of the face, lips, or tongue -bone, muscle or joint pain -breathing problems -chest pain or chest tightness -dark yellow or brown urine -dizziness -fast, irregular heartbeat -feeling faint or lightheaded -fever or sore throat -muscle spasm -palpitations -rash on cheeks or arms  that gets worse in the sun -redness, blistering, peeling or loosening of the skin, including inside the mouth -seizures -tremors -unusual bleeding or bruising -unusually weak or tired -yellowing of the eyes or skin Side effects that usually do not require medical attention (report to your doctor or health care professional if they continue or are bothersome): -constipation -diarrhea -dry mouth -headache -nausea This list may not describe all possible side effects. Call your doctor for medical advice about side effects. You may report side effects to FDA at 1-800-FDA-1088. Where should I keep my medicine? Keep out of the reach of children. Store at room temperature between 15 and 30 degrees C (59 and 86 degrees F). Protect from light and moisture. Throw away any unused medicine after the expiration date. NOTE: This sheet is a summary. It may not cover all possible information. If you have questions about this medicine, talk to your doctor, pharmacist, or health care provider.  2018 Elsevier/Gold Standard  (2015-10-26 12:18:47)

## 2018-08-17 NOTE — Progress Notes (Signed)
Follow Up  Subjective:    Patient ID: Misty Mccullough, female    DOB: 10-11-1970, 47 y.o.   MRN: [id number]   Chief Complaint  Patient presents with  . Follow-up    chronic condition   HPI  Misty Mccullough is a 47 year old female with a past medical history of Hypertension, Hypercholesteremia, Heavy Cigarette, Gallstones, and Diabetes. She is here today for follow up.    Current Status: Since her last office visit, she is doing well with  Occasional c/o migraines, which she takes [facility] powder for relief. She continues to smoke 1 pack cigarettes a day, decreased from 2 packs a day. She has mild insomnia and usually awakes twice a night. She is not taking any medication for sleep aide. She is currently not taking Insulin.   She denies fevers, chills, fatigue, recent infections, weight loss, and night sweats. She has not had any visual changes, dizziness, and falls. No chest pain, heart palpitations, cough and shortness of breath reported. No reports of GI problems such as nausea, vomiting, diarrhea, and constipation. She has no reports of blood in stools, dysuria and hematuria. No depression or anxiety reported.   Past Medical History:  Diagnosis Date  . Diabetes mellitus without complication (HCC)   . Gallstones   . Heavy cigarette smoker   . Hypercholesteremia   . Hypertension     Family History  Problem Relation Age of Onset  . Diabetes Mother   . Uterine cancer Mother   . Diabetes Father   . Heart attack Father 27  . Stroke Father   . Heart attack Paternal Grandmother   . Heart attack Paternal Grandfather   . Stroke Sister   . Heart attack Paternal Uncle   . Bone cancer Paternal Uncle     Social History   Socioeconomic History  . Marital status: Married    Spouse name: Not on file  . Number of children: Not on file  . Years of education: Not on file  . Highest education level: Not on file  Occupational History  . Not on file  Social Needs  . Financial resource strain: Not on  file  . Food insecurity:    Worry: Not on file    Inability: Not on file  . Transportation needs:    Medical: Not on file    Non-medical: Not on file  Tobacco Use  . Smoking status: Current Every Day Smoker    Packs/day: 1.50  . Smokeless tobacco: Never Used  . Tobacco comment: Since age 41.  Substance and Sexual Activity  . Alcohol use: Yes    Comment: Occasional  . Drug use: No  . Sexual activity: Not on file  Lifestyle  . Physical activity:    Days per week: Not on file    Minutes per session: Not on file  . Stress: Not on file  Relationships  . Social connections:    Talks on phone: Not on file    Gets together: Not on file    Attends religious service: Not on file    Active member of club or organization: Not on file    Attends meetings of clubs or organizations: Not on file    Relationship status: Not on file  . Intimate partner violence:    Fear of current or ex partner: Not on file    Emotionally abused: Not on file    Physically abused: Not on file    Forced sexual activity: Not on file  Other Topics Concern  . Not on file  Social History Narrative  . Not on file    Past Surgical History:  Procedure Laterality Date  . TUBAL LIGATION       Immunization History  Administered Date(s) Administered  . Influenza Whole 06/21/2011  . Influenza,inj,Quad PF,6+ Mos 09/06/2016, 08/17/2018  . Pneumococcal Polysaccharide-23 12/06/2016  . Pneumococcal-Unspecified 06/21/2011  . Tdap 12/06/2016    Current Meds  Medication Sig  . aspirin 325 MG EC tablet Take 1 tablet (325 mg total) by mouth daily.  Marland Kitchen lisinopril (PRINIVIL,ZESTRIL) 40 MG tablet Take 1 tablet (40 mg total) by mouth daily.  . metFORMIN (GLUCOPHAGE) 1000 MG tablet Take 1 tablet (1,000 mg total) by mouth 2 (two) times daily with a meal.  . [DISCONTINUED] metFORMIN (GLUCOPHAGE) 1000 MG tablet Take 1 tablet (1,000 mg total) by mouth 2 (two) times daily with a meal.    No Known Allergies  BP (!)  142/78 (BP Location: Left Arm, Patient Position: Sitting, Cuff Size: Small)   Pulse 80   Temp 98.1 F (36.7 C) (Oral)   Ht 5\' 4"  (1.626 m)   Wt 163 lb 6.4 oz (74.1 kg)   LMP 07/27/2018   SpO2 100%   BMI 28.05 kg/m   Review of Systems  Constitutional: Negative.   HENT: Negative.   Eyes: Negative.   Respiratory: Negative.   Cardiovascular: Negative.   Gastrointestinal: Negative.   Endocrine: Negative.   Genitourinary: Negative.   Musculoskeletal: Negative.   Skin: Negative.   Allergic/Immunologic: Negative.   Neurological: Positive for headaches (Migraines).  Hematological: Negative.   Psychiatric/Behavioral: Positive for sleep disturbance (moderate insomnia).   Objective:   Physical Exam  Constitutional: She is oriented to person, place, and time. She appears well-developed and well-nourished.  HENT:  Head: Normocephalic and atraumatic.  Eyes: Pupils are equal, round, and reactive to light. Conjunctivae and EOM are normal.  Neck: Normal range of motion. Neck supple.  Cardiovascular: Normal rate, regular rhythm, normal heart sounds and intact distal pulses.  Pulmonary/Chest: Effort normal and breath sounds normal.  Abdominal: Soft. Bowel sounds are normal. She exhibits distension.  Musculoskeletal: Normal range of motion.  Neurological: She is alert and oriented to person, place, and time.  Skin: Skin is warm and dry.  Psychiatric: She has a normal mood and affect. Her behavior is normal. Judgment and thought content normal.  Nursing note and vitals reviewed.  Assessment & Plan:   1. Diabetes mellitus without complication (HCC) Improved. Hgb A1c is 9.3 today, from 11.6 on 01/30/2018. Continue Metformin as prescribed.  She will continue to decrease foods/beverages high in sugars and carbs and follow Heart Healthy or DASH diet. Increase physical activity to at least 30 minutes cardio exercise daily.  - POCT glycosylated hemoglobin (Hb A1C) - POCT urinalysis dipstick -  atorvastatin (LIPITOR) 10 MG tablet; Take 1 tablet (10 mg total) by mouth daily.  Dispense: 90 tablet; Refill: 1 - metFORMIN (GLUCOPHAGE) 1000 MG tablet; Take 1 tablet (1,000 mg total) by mouth 2 (two) times daily with a meal.  Dispense: 120 tablet; Refill: 2  2. Need for immunization against influenza - Flu Vaccine QUAD 36+ mos IM  3. Insomnia, unspecified type We will initiate Trazodone today. Monitor.  - traZODone (DESYREL) 50 MG tablet; Take 0.5-1 tablets (25-50 mg total) by mouth at bedtime as needed for sleep.  Dispense: 30 tablet; Refill: 3  4. Type 2 diabetes mellitus without complication, without long-term current use of insulin (HCC) - metFORMIN (GLUCOPHAGE)  1000 MG tablet; Take 1 tablet (1,000 mg total) by mouth 2 (two) times daily with a meal.  Dispense: 120 tablet; Refill: 2  5. Gastroesophageal reflux disease without esophagitis We will initiate Prilosec today.  - omeprazole (PRILOSEC) 20 MG capsule; Take 1 capsule (20 mg total) by mouth daily.  Dispense: 30 capsule; Refill: 3  6. Tobacco dependence  7. Encounter for smoking cessation counseling Benefits of quitting smoking revealed with patient today. She will contact office if she needs help with quitting smoking.   8. Follow up She will follow up in 3 months.   Meds ordered this encounter  Medications  . traZODone (DESYREL) 50 MG tablet    Sig: Take 0.5-1 tablets (25-50 mg total) by mouth at bedtime as needed for sleep.    Dispense:  30 tablet    Refill:  3  . atorvastatin (LIPITOR) 10 MG tablet    Sig: Take 1 tablet (10 mg total) by mouth daily.    Dispense:  90 tablet    Refill:  1  . metFORMIN (GLUCOPHAGE) 1000 MG tablet    Sig: Take 1 tablet (1,000 mg total) by mouth 2 (two) times daily with a meal.    Dispense:  120 tablet    Refill:  2  . omeprazole (PRILOSEC) 20 MG capsule    Sig: Take 1 capsule (20 mg total) by mouth daily.    Dispense:  30 capsule    Refill:  3   Raliegh Ip,  MSN,  FNP-C Patient [facility] [address] [place], [place] [address] [phone]

## 2018-09-10 NOTE — Telephone Encounter (Signed)
Not needed

## 2018-11-04 ENCOUNTER — Ambulatory Visit (INDEPENDENT_AMBULATORY_CARE_PROVIDER_SITE_OTHER): Payer: Self-pay | Admitting: Family Medicine

## 2018-11-04 ENCOUNTER — Encounter: Payer: Self-pay | Admitting: Family Medicine

## 2018-11-04 VITALS — BP 142/78 | HR 90 | Temp 97.8°F | Ht 64.0 in | Wt 163.2 lb

## 2018-11-04 DIAGNOSIS — G4700 Insomnia, unspecified: Secondary | ICD-10-CM | POA: Insufficient documentation

## 2018-11-04 DIAGNOSIS — M79675 Pain in left toe(s): Secondary | ICD-10-CM | POA: Insufficient documentation

## 2018-11-04 DIAGNOSIS — Z09 Encounter for follow-up examination after completed treatment for conditions other than malignant neoplasm: Secondary | ICD-10-CM

## 2018-11-04 DIAGNOSIS — M7989 Other specified soft tissue disorders: Secondary | ICD-10-CM

## 2018-11-04 MED ORDER — TRAZODONE HCL 100 MG PO TABS: 100.0000 mg | ORAL_TABLET | Freq: Every evening | ORAL | 3 refills | Status: DC | PRN

## 2018-11-04 NOTE — Patient Instructions (Addendum)
Insomnia Insomnia is a sleep disorder that makes it difficult to fall asleep or stay asleep. Insomnia can cause fatigue, low energy, difficulty concentrating, mood swings, and poor performance at work or school. There are three different ways to classify insomnia:  Difficulty falling asleep.  Difficulty staying asleep.  Waking up too early in the morning. Any type of insomnia can be long-term (chronic) or short-term (acute). Both are common. Short-term insomnia usually lasts for three months or less. Chronic insomnia occurs at least three times a week for longer than three months. What are the causes? Insomnia may be caused by another condition, situation, or substance, such as:  Anxiety.  Certain medicines.  Gastroesophageal reflux disease (GERD) or other gastrointestinal conditions.  Asthma or other breathing conditions.  Restless legs syndrome, sleep apnea, or other sleep disorders.  Chronic pain.  Menopause.  Stroke.  Abuse of alcohol, tobacco, or illegal drugs.  Mental health conditions, such as depression.  Caffeine.  Neurological disorders, such as Alzheimer's disease.  An overactive thyroid (hyperthyroidism). Sometimes, the cause of insomnia may not be known. What increases the risk? Risk factors for insomnia include:  Gender. Women are affected more often than men.  Age. Insomnia is more common as you get older.  Stress.  Lack of exercise.  Irregular work schedule or working night shifts.  Traveling between different time zones.  Certain medical and mental health conditions. What are the signs or symptoms? If you have insomnia, the main symptom is having trouble falling asleep or having trouble staying asleep. This may lead to other symptoms, such as:  Feeling fatigued or having low energy.  Feeling nervous about going to sleep.  Not feeling rested in the morning.  Having trouble concentrating.  Feeling irritable, anxious, or depressed. How  is this diagnosed? This condition may be diagnosed based on:  Your symptoms and medical history. Your health care provider may ask about: ? Your sleep habits. ? Any medical conditions you have. ? Your mental health.  A physical exam. How is this treated? Treatment for insomnia depends on the cause. Treatment may focus on treating an underlying condition that is causing insomnia. Treatment may also include:  Medicines to help you sleep.  Counseling or therapy.  Lifestyle adjustments to help you sleep better. Follow these instructions at home: Eating and drinking   Limit or avoid alcohol, caffeinated beverages, and cigarettes, especially close to bedtime. These can disrupt your sleep.  Do not eat a large meal or eat spicy foods right before bedtime. This can lead to digestive discomfort that can make it hard for you to sleep. Sleep habits   Keep a sleep diary to help you and your health care provider figure out what could be causing your insomnia. Write down: ? When you sleep. ? When you wake up during the night. ? How well you sleep. ? How rested you feel the next day. ? Any side effects of medicines you are taking. ? What you eat and drink.  Make your bedroom a dark, comfortable place where it is easy to fall asleep. ? Put up shades or blackout curtains to block light from outside. ? Use a white noise machine to block noise. ? Keep the temperature cool.  Limit screen use before bedtime. This includes: ? Watching TV. ? Using your smartphone, tablet, or computer.  Stick to a routine that includes going to bed and waking up at the same times every day and night. This can help you fall asleep faster. Consider   making a quiet activity, such as reading, part of your nighttime routine.  Try to avoid taking naps during the day so that you sleep better at night.  Get out of bed if you are still awake after 15 minutes of trying to sleep. Keep the lights down, but try reading or  doing a quiet activity. When you feel sleepy, go back to bed. General instructions  Take over-the-counter and prescription medicines only as told by your health care provider.  Exercise regularly, as told by your health care provider. Avoid exercise starting several hours before bedtime.  Use relaxation techniques to manage stress. Ask your health care provider to suggest some techniques that may work well for you. These may include: ? Breathing exercises. ? Routines to release muscle tension. ? Visualizing peaceful scenes.  Make sure that you drive carefully. Avoid driving if you feel very sleepy.  Keep all follow-up visits as told by your health care provider. This is important. Contact a health care provider if:  You are tired throughout the day.  You have trouble in your daily routine due to sleepiness.  You continue to have sleep problems, or your sleep problems get worse. Get help right away if:  You have serious thoughts about hurting yourself or someone else. If you ever feel like you may hurt yourself or others, or have thoughts about taking your own life, get help right away. You can go to your nearest emergency department or call:  Your local emergency services (911 in the [place]  A suicide crisis helpline, such as the National Suicide Prevention Lifeline at 1-800-273-8255. This is open 24 hours a day. Summary  Insomnia is a sleep disorder that makes it difficult to fall asleep or stay asleep.  Insomnia can be long-term (chronic) or short-term (acute).  Treatment for insomnia depends on the cause. Treatment may focus on treating an underlying condition that is causing insomnia.  Keep a sleep diary to help you and your health care provider figure out what could be causing your insomnia. This information is not intended to replace advice given to you by your health care provider. Make sure you discuss any questions you have with your health care provider. Document  Released: 09/20/2000 Document Revised: 07/03/2017 Document Reviewed: 07/03/2017 Elsevier Interactive Patient Education  2019 [employer]    Trazodone tablets What is this medicine? TRAZODONE (TRAZ oh done) is used to treat depression. This medicine may be used for other purposes; ask your health care provider or pharmacist if you have questions. COMMON BRAND NAME(S): Desyrel What should I tell my health care provider before I take this medicine? They need to know if you have any of these conditions: -attempted suicide or thinking about it -bipolar disorder -bleeding problems -glaucoma -heart disease, or previous heart attack -irregular heart beat -kidney or liver disease -low levels of sodium in the blood -an unusual or allergic reaction to trazodone, other medicines, foods, dyes or preservatives -pregnant or trying to get pregnant -breast-feeding How should I use this medicine? Take this medicine by mouth with a glass of water. Follow the directions on the prescription label. Take this medicine shortly after a meal or a light snack. Take your medicine at regular intervals. Do not take your medicine more often than directed. Do not stop taking this medicine suddenly except upon the advice of your doctor. Stopping this medicine too quickly may cause serious side effects or your condition may worsen. A special MedGuide will be given to you by the   pharmacist with each prescription and refill. Be sure to read this information carefully each time. Talk to your pediatrician regarding the use of this medicine in children. Special care may be needed. Overdosage: If you think you have taken too much of this medicine contact a poison control center or emergency room at once. NOTE: This medicine is only for you. Do not share this medicine with others. What if I miss a dose? If you miss a dose, take it as soon as you can. If it is almost time for your next dose, take only that dose. Do not take  double or extra doses. What may interact with this medicine? Do not take this medicine with any of the following medications: -certain medicines for fungal infections like fluconazole, itraconazole, ketoconazole, posaconazole, voriconazole -cisapride -dofetilide -dronedarone -linezolid -MAOIs like Carbex, Eldepryl, Marplan, Nardil, and Parnate -mesoridazine -methylene blue (injected into a vein) -pimozide -saquinavir -thioridazine This medicine may also interact with the following medications: -alcohol -antiviral medicines for HIV or AIDS -aspirin and aspirin-like medicines -barbiturates like phenobarbital -certain medicines for blood pressure, heart disease, irregular heart beat -certain medicines for depression, anxiety, or psychotic disturbances -certain medicines for migraine headache like almotriptan, eletriptan, frovatriptan, naratriptan, rizatriptan, sumatriptan, zolmitriptan -certain medicines for seizures like carbamazepine and phenytoin -certain medicines for sleep -certain medicines that treat or prevent blood clots like dalteparin, enoxaparin, warfarin -digoxin -fentanyl -lithium -NSAIDS, medicines for pain and inflammation, like ibuprofen or naproxen -other medicines that prolong the QT interval (cause an abnormal heart rhythm) -rasagiline -supplements like St. John's wort, kava kava, valerian -tramadol -tryptophan This list may not describe all possible interactions. Give your health care provider a list of all the medicines, herbs, non-prescription drugs, or dietary supplements you use. Also tell them if you smoke, drink alcohol, or use illegal drugs. Some items may interact with your medicine. What should I watch for while using this medicine? Tell your doctor if your symptoms do not get better or if they get worse. Visit your doctor or health care professional for regular checks on your progress. Because it may take several weeks to see the full effects of this  medicine, it is important to continue your treatment as prescribed by your doctor. Patients and their families should watch out for new or worsening thoughts of suicide or depression. Also watch out for sudden changes in feelings such as feeling anxious, agitated, panicky, irritable, hostile, aggressive, impulsive, severely restless, overly excited and hyperactive, or not being able to sleep. If this happens, especially at the beginning of treatment or after a change in dose, call your health care professional. You may get drowsy or dizzy. Do not drive, use machinery, or do anything that needs mental alertness until you know how this medicine affects you. Do not stand or sit up quickly, especially if you are an older patient. This reduces the risk of dizzy or fainting spells. Alcohol may interfere with the effect of this medicine. Avoid alcoholic drinks. This medicine may cause dry eyes and blurred vision. If you wear contact lenses you may feel some discomfort. Lubricating drops may help. See your eye doctor if the problem does not go away or is severe. Your mouth may get dry. Chewing sugarless gum, sucking hard candy and drinking plenty of water may help. Contact your doctor if the problem does not go away or is severe. What side effects may I notice from receiving this medicine? Side effects that you should report to your doctor or health care professional as   allergic reactions like skin rash, itching or hives, swelling of the face, lips, or tongue -elevated mood, decreased need for sleep, racing thoughts, impulsive behavior -confusion -fast, irregular heartbeat -feeling faint or lightheaded, falls -feeling agitated, angry, or irritable -loss of balance or coordination -painful or prolonged erections -restlessness, pacing, inability to keep still -suicidal thoughts or other mood changes -tremors -trouble sleeping -seizures -unusual bleeding or bruising Side effects that usually do  not require medical attention (report to your doctor or health care professional if they continue or are bothersome): -change in sex drive or performance -change in appetite or weight -constipation -headache -muscle aches or pains -nausea This list may not describe all possible side effects. Call your doctor for medical advice about side effects. You may report side effects to FDA at 1-800-FDA-1088. Where should I keep my medicine? Keep out of the reach of children. Store at room temperature between 15 and 30 degrees C (59 to 86 degrees F). Protect from light. Keep container tightly closed. Throw away any unused medicine after the expiration date. NOTE: This sheet is a summary. It may not cover all possible information. If you have questions about this medicine, talk to your doctor, pharmacist, or health care provider.  2019 Elsevier/[employer] (2017-12-02 17:51:24)    Toe Fracture A toe fracture is a break in one of the toe bones (phalanges). This may happen if you:  Drop a heavy object on your toe.  Stub your toe.  Twist your toe.  Exercise the same way too much. What are the signs or symptoms? The main symptoms are swelling and pain in the toe. You may also have:  Bruising.  Stiffness.  Numbness.  A change in the way the toe looks.  Broken bones that poke through the skin.  Blood under the toenail. How is this treated? Treatments may include:  Taping the broken toe to a toe that is next to it (buddy taping).  Wearing a shoe that has a wide, rigid sole to protect the toe and to limit its movement.  Wearing a cast.  Surgery. This may be needed if the: ? Pieces of broken bone are out of place. ? Bone pokes through the skin.  Physical therapy. Follow these instructions at home: If you have a shoe:  Wear the shoe as told by your doctor. Remove it only as told by your doctor.  Loosen the shoe if your toes tingle, become numb, or turn cold and blue.  Keep  the shoe clean and dry. If you have a cast:  Do not put pressure on any part of the cast until it is fully hardened. This may take a few hours.  Do not stick anything inside the cast to scratch your skin.  Check the skin around the cast every day. Tell your doctor about any concerns.  You may put lotion on dry skin around the edges of the cast.  Do not put lotion on the skin under the cast.  Keep the cast clean and dry. Bathing  Do not take baths, swim, or use a hot tub until your doctor says it is okay. Ask your doctor if you can take showers.  If the shoe or cast is not waterproof: ? Do not let it get wet. ? Cover it with a watertight covering when you take a bath or a shower. Activity  Do not use your foot to support your body weight until your doctor says it is okay.  Use crutches as told by  your doctor.  Ask your doctor what activities are safe for you during recovery.  Avoid activities as told by your doctor.  Do exercises as told by your doctor or therapist. Driving  Do not drive or use heavy machinery while taking pain medicine.  Do not drive while wearing a cast on a foot that you use for driving. Managing pain, stiffness, and swelling   Put ice on the injured area if told by your doctor: ? Put ice in a plastic bag. ? Place a towel between your skin and the bag.  If you have a shoe, remove it as told by your doctor.  If you have a cast, place a towel between your cast and the bag. ? Leave the ice on for 20 minutes, 2-3 times per day.  Raise (elevate) the injured area above the level of your heart while you are sitting or lying down. General instructions  If your toe was taped to a toe that is next to it, follow your doctor's instructions for changing the gauze and tape. Change it more often: ? If the gauze and tape get wet. If this happens, dry the space between the toes. ? If the gauze and tape are too tight and they cause your toe to become pale or to  lose feeling (go numb).  If your doctor did not give you a protective shoe, wear sturdy shoes that support your foot. Your shoes should not: ? Pinch your toes. ? Fit tightly against your toes.  Do not use any tobacco products, including cigarettes, chewing tobacco, or e-cigarettes. These can delay bone healing. If you need help quitting, ask your doctor.  Take medicines only as told by your doctor.  Keep all follow-up visits as told by your doctor. This is important. Contact a doctor if:  Your pain medicine is not helping.  You have a fever.  You notice a bad smell coming from your cast. Get help right away if:  You lose feeling (have numbness) in your toe or foot, and it is getting worse.  Your toe or your foot tingles.  Your toe or your foot gets cold or turns blue.  You have redness or swelling in your toe or foot, and it is getting worse.  You have very bad pain. Summary  A toe fracture is a break in one of the toe bones.  Use ice and raise your foot. This will help lessen pain and swelling.  Use crutches as told by your doctor. This information is not intended to replace advice given to you by your health care provider. Make sure you discuss any questions you have with your health care provider. Document Released: 03/11/2008 Document Revised: 11/04/2017 Document Reviewed: 11/04/2017 [employer] Interactive Patient Education  2019 [employer]

## 2018-11-04 NOTE — Progress Notes (Signed)
[facility]   Sick Visit--Established Patient  Subjective:  Patient ID: Misty Mccullough, female    DOB: 05/28/1971  Age: 48 y.o. MRN: [id number]  CC:  Chief Complaint  Patient presents with  . Foot Swelling    left    HPI Misty Mccullough is a 48 year old female who presents for Sick Visit today.   Past Medical History:  Diagnosis Date  . Diabetes mellitus without complication (HCC)   . Gallstones   . Heavy cigarette smoker   . Hypercholesteremia   . Hypertension    Current Status: Since her last office visit, she is doing well with c/o left foot pain. She has neuropathy in her feet, and is not aware of any trauma to her foot. She states that her foot has been swollen for 1 week now. she has been soaking her foot in Epsom salt and elevating her foot. She has not taking any medication for pain relief. She doesn't check her blood glucose levels unless she is feeling badly. She denies fatigue, frequent urination, blurred vision, excessive hunger, excessive thirst, weight gain, weight loss, and poor wound healing. She denies visual changes, chest pain, cough, shortness of breath, heart palpitations, and falls. She has occasionally headaches and dizziness with position changes. Denies severe headaches, confusion, seizures, double vision, and blurred vision, nausea and vomiting.  She denies fevers, chills, fatigue, recent infections, weight loss, and night sweats. She has not had any headaches, visual changes, dizziness, and falls. No chest pain, heart palpitations, cough and shortness of breath reported. No reports of GI problems such as nausea, vomiting, diarrhea, and constipation. She has no reports of blood in stools, dysuria and hematuria. No depression or anxiety reported. She denies pain today.   Past Surgical History:  Procedure Laterality Date  . TUBAL LIGATION      Family History  Problem Relation Age of Onset  . Diabetes Mother   .  Uterine cancer Mother   . Diabetes Father   . Heart attack Father 2  . Stroke Father   . Heart attack Paternal Grandmother   . Heart attack Paternal Grandfather   . Stroke Sister   . Heart attack Paternal Uncle   . Bone cancer Paternal Uncle     Social History   Socioeconomic History  . Marital status: Married    Spouse name: Not on file  . Number of children: Not on file  . Years of education: Not on file  . Highest education level: Not on file  Occupational History  . Not on file  Social Needs  . Financial resource strain: Not on file  . Food insecurity:    Worry: Not on file    Inability: Not on file  . Transportation needs:    Medical: Not on file    Non-medical: Not on file  Tobacco Use  . Smoking status: Current Every Day Smoker    Packs/day: 1.50  . Smokeless tobacco: Never Used  . Tobacco comment: Since age 64.  Substance and Sexual Activity  . Alcohol use: Yes    Comment: Occasional  . Drug use: No  . Sexual activity: Not on file  Lifestyle  . Physical activity:    Days per week: Not on file    Minutes per session: Not on file  . Stress: Not on file  Relationships  . Social connections:    Talks on phone: Not on file    Gets together: Not on  file    Attends religious service: Not on file    Active member of club or organization: Not on file    Attends meetings of clubs or organizations: Not on file    Relationship status: Not on file  . Intimate partner violence:    Fear of current or ex partner: Not on file    Emotionally abused: Not on file    Physically abused: Not on file    Forced sexual activity: Not on file  Other Topics Concern  . Not on file  Social History Narrative  . Not on file    Outpatient Medications Mccullough to Visit  Medication Sig Dispense Refill  . aspirin 325 MG EC tablet Take 1 tablet (325 mg total) by mouth daily. 30 tablet 0  . atorvastatin (LIPITOR) 10 MG tablet Take 1 tablet (10 mg total) by mouth daily. 90 tablet 1    . Insulin Syringes, Disposable, U-100 0.3 ML MISC 1 each by Does not apply route at bedtime. 100 each 11  . lisinopril (PRINIVIL,ZESTRIL) 40 MG tablet Take 1 tablet (40 mg total) by mouth daily. 90 tablet 2  . metFORMIN (GLUCOPHAGE) 1000 MG tablet Take 1 tablet (1,000 mg total) by mouth 2 (two) times daily with a meal. 120 tablet 2  . omeprazole (PRILOSEC) 20 MG capsule Take 1 capsule (20 mg total) by mouth daily. 30 capsule 3  . thiamine 100 MG tablet Take 1 tablet (100 mg total) by mouth daily. 30 tablet 0  . traZODone (DESYREL) 50 MG tablet Take 0.5-1 tablets (25-50 mg total) by mouth at bedtime as needed for sleep. 30 tablet 3  . insulin glargine (LANTUS) 100 UNIT/ML injection Inject 0.2 mLs (20 Units total) into the skin at bedtime. (Patient not taking: Reported on 11/04/2018) 10 mL 11  . Insulin Pen Needle (BD ULTRA-FINE PEN NEEDLES) 29G X 12.7MM MISC ICD10 E11.9 for use with insulin administration (Patient not taking: Reported on 11/04/2018) 200 each 2  . buPROPion (WELLBUTRIN SR) 150 MG 12 hr tablet Take 1 tablet (150 mg total) by mouth 2 (two) times daily. (Patient not taking: Reported on 08/17/2018) 60 tablet 1  . folic acid (FOLVITE) 1 MG tablet Take 1 tablet (1 mg total) by mouth daily. (Patient not taking: Reported on 08/17/2018) 30 tablet 0  . Multiple Vitamin (MULTIVITAMIN WITH MINERALS) TABS tablet Take 1 tablet by mouth daily. (Patient not taking: Reported on 08/17/2018) 30 tablet 0  . ranitidine (ZANTAC) 150 MG tablet Take 1 tablet (150 mg total) by mouth 2 (two) times daily. (Patient not taking: Reported on 08/17/2018) 60 tablet 0   No facility-administered medications Mccullough to visit.     No Known Allergies  ROS Review of Systems  Constitutional: Negative.   HENT: Negative.   Eyes: Negative.   Respiratory: Negative.   Cardiovascular: Negative.   Gastrointestinal: Negative.   Endocrine: Negative.   Genitourinary: Negative.   Musculoskeletal: Negative.    Allergic/Immunologic: Negative.   Neurological: Positive for dizziness and headaches.  Hematological: Negative.   Psychiatric/Behavioral: Negative.       Objective:    Physical Exam  Constitutional: She is oriented to person, place, and time. She appears well-developed and well-nourished.  Eyes: Conjunctivae are normal.  Neck: Normal range of motion. Neck supple.  Cardiovascular: Normal rate, regular rhythm, normal heart sounds and intact distal pulses.  Pulmonary/Chest: Effort normal and breath sounds normal.  Abdominal: Soft. Bowel sounds are normal.  Musculoskeletal: Normal range of motion.  General: Edema (left foot) present.  Neurological: She is alert and oriented to person, place, and time. She has normal reflexes.  Skin: Skin is warm and dry.  Psychiatric: She has a normal mood and affect. Her behavior is normal. Judgment and thought content normal.  Nursing note and vitals reviewed.  BP (!) 142/78 (BP Location: Right Arm, Patient Position: Sitting, Cuff Size: Small)   Pulse 90   Temp 97.8 F (36.6 C) (Oral)   Ht 5\' 4"  (1.626 m)   Wt 163 lb 3.2 oz (74 kg)   LMP 06/23/2018   SpO2 100%   BMI 28.01 kg/m  Wt Readings from Last 3 Encounters:  11/04/18 163 lb 3.2 oz (74 kg)  08/17/18 163 lb 6.4 oz (74.1 kg)  01/30/18 155 lb (70.3 kg)     Health Maintenance Due  Topic Date Due  . OPHTHALMOLOGY EXAM  11/28/1979    There are no preventive care reminders to display for this patient.  Lab Results  Component Value Date   TSH 1.530 12/29/2017   Lab Results  Component Value Date   WBC 5.9 12/29/2017   HGB 12.3 12/29/2017   HCT 38.1 12/29/2017   MCV 88 12/29/2017   PLT 189 12/29/2017   Lab Results  Component Value Date   NA 140 12/29/2017   K 4.2 12/29/2017   CO2 22 12/29/2017   GLUCOSE 211 (H) 12/29/2017   BUN 10 12/29/2017   CREATININE 0.56 (L) 12/29/2017   BILITOT 0.3 12/29/2017   ALKPHOS 102 12/29/2017   AST 15 12/29/2017   ALT 20 12/29/2017    PROT 7.0 12/29/2017   ALBUMIN 4.1 12/29/2017   CALCIUM 9.4 12/29/2017   ANIONGAP 7 12/16/2017   Lab Results  Component Value Date   CHOL 181 12/16/2017   Lab Results  Component Value Date   HDL 31 (L) 12/16/2017   Lab Results  Component Value Date   LDLCALC 133 (H) 12/16/2017   Lab Results  Component Value Date   TRIG 86 12/16/2017   Lab Results  Component Value Date   CHOLHDL 5.8 12/16/2017   Lab Results  Component Value Date   HGBA1C 9.3 (A) 08/17/2018    Assessment & Plan:   1. Pain and swelling of toe, left Probable mild fracture of left 5th toe. We taped toe to 4th toe in office. Negative. Foot exam tolerated well. No decreased sensitivity noted upon foot exam. Patient counseled on proper foot hygiene. She is encouraged to exam feet often (daily), using mirror if necessary; keep feet clean and dry (especially between toes), keep feet moistened, wear cotton socks, and avoid wearing open-toed shoes, high-heel shoes, and sandals. Patient verbalized understanding. Additional discharge instructions reviewed and printed on AVS.   2. Insomnia, unspecified type Dosage ineffective. We will increase dosage to 100 mg QHS. Monitor.  - traZODone (DESYREL) 100 MG tablet; Take 1 tablet (100 mg total) by mouth at bedtime as needed for sleep.  Dispense: 30 tablet; Refill: 3  3. Follow up She will keep follow up appointment.   Meds ordered this encounter  Medications  . traZODone (DESYREL) 100 MG tablet    Sig: Take 1 tablet (100 mg total) by mouth at bedtime as needed for sleep.    Dispense:  30 tablet    Refill:  3   Raliegh IpNatalie Yerlin Gasparyan,  MSN, FNP-C Patient [facility] [address]  [place], [place] [address] [phone]   Problem List Items Addressed This Visit  None    Visit Diagnoses    Pain and swelling of toe, left    -  Primary   Insomnia, unspecified type       Relevant Medications   traZODone (DESYREL) 100 MG tablet   Follow  up          Meds ordered this encounter  Medications  . traZODone (DESYREL) 100 MG tablet    Sig: Take 1 tablet (100 mg total) by mouth at bedtime as needed for sleep.    Dispense:  30 tablet    Refill:  3    Follow-up: No follow-ups on file.    Misty Locks, FNP

## 2018-11-17 ENCOUNTER — Ambulatory Visit: Payer: Self-pay | Admitting: Family Medicine

## 2018-11-20 ENCOUNTER — Ambulatory Visit (INDEPENDENT_AMBULATORY_CARE_PROVIDER_SITE_OTHER): Payer: Self-pay | Admitting: Family Medicine

## 2018-11-20 ENCOUNTER — Encounter: Payer: Self-pay | Admitting: Family Medicine

## 2018-11-20 VITALS — BP 152/80 | HR 82 | Temp 97.6°F | Ht 64.0 in | Wt 163.0 lb

## 2018-11-20 DIAGNOSIS — R600 Localized edema: Secondary | ICD-10-CM

## 2018-11-20 DIAGNOSIS — R0602 Shortness of breath: Secondary | ICD-10-CM

## 2018-11-20 DIAGNOSIS — Z09 Encounter for follow-up examination after completed treatment for conditions other than malignant neoplasm: Secondary | ICD-10-CM

## 2018-11-20 DIAGNOSIS — R059 Cough, unspecified: Secondary | ICD-10-CM

## 2018-11-20 DIAGNOSIS — E119 Type 2 diabetes mellitus without complications: Secondary | ICD-10-CM

## 2018-11-20 DIAGNOSIS — R05 Cough: Secondary | ICD-10-CM

## 2018-11-20 DIAGNOSIS — F17200 Nicotine dependence, unspecified, uncomplicated: Secondary | ICD-10-CM

## 2018-11-20 DIAGNOSIS — Z716 Tobacco abuse counseling: Secondary | ICD-10-CM

## 2018-11-20 LAB — POCT URINALYSIS DIP (MANUAL ENTRY)
Bilirubin, UA: NEGATIVE
Blood, UA: NEGATIVE
Glucose, UA: NEGATIVE mg/dL
Ketones, POC UA: NEGATIVE mg/dL
Leukocytes, UA: NEGATIVE
Nitrite, UA: NEGATIVE
Protein Ur, POC: NEGATIVE mg/dL
Spec Grav, UA: 1.02 (ref 1.010–1.025)
Urobilinogen, UA: 1 E.U./dL
pH, UA: 5.5 (ref 5.0–8.0)

## 2018-11-20 LAB — POCT GLYCOSYLATED HEMOGLOBIN (HGB A1C): Hemoglobin A1C: 12.3 % — AB (ref 4.0–5.6)

## 2018-11-20 MED ORDER — ALBUTEROL SULFATE HFA 108 (90 BASE) MCG/ACT IN AERS: 2.0000 | INHALATION_SPRAY | Freq: Four times a day (QID) | RESPIRATORY_TRACT | 6 refills | Status: DC | PRN

## 2018-11-20 MED ORDER — FUROSEMIDE 20 MG PO TABS: 20.0000 mg | ORAL_TABLET | Freq: Every day | ORAL | 1 refills | Status: DC

## 2018-11-20 NOTE — Progress Notes (Signed)
[facility]  Established Patient Office Visit  Subjective:  Patient ID: Misty Mccullough, female    DOB: [birth year]  Age: 48 y.o. MRN: [id number]  CC:  Chief Complaint  Patient presents with  . Follow-up    Chronic condition     HPI Misty Mccullough is a 48 year old female who presents for Follow Up.   Past Medical History:  Diagnosis Date  . Bilateral lower extremity edema   . Cough   . Diabetes mellitus without complication (HCC)   . Gallstones   . Heavy cigarette smoker   . Hypercholesteremia   . Hypertension   . Shortness of breath    Current Status: Since her last office visit, she recently had common cold and is feeling much better today. She continues to smoke 1 pack cigarettes a day. She states that she has shortness of breath and coughing. She denies visual changes, chest pain, cough, shortness of breath, heart palpitations, and falls. She has occasional headaches and dizziness with position changes. Denies severe headaches, confusion, seizures, double vision, and blurred vision, nausea and vomiting.  She denies fevers, chills, fatigue, recent infections, weight loss, and night sweats. No reports of GI problems such as diarrhea, and constipation. She has no reports of blood in stools, dysuria and hematuria. No depression or anxiety reported. She denies pain today.   Past Surgical History:  Procedure Laterality Date  . TUBAL LIGATION      Family History  Problem Relation Age of Onset  . Diabetes Mother   . Uterine cancer Mother   . Diabetes Father   . Heart attack Father 57  . Stroke Father   . Heart attack Paternal Grandmother   . Heart attack Paternal Grandfather   . Stroke Sister   . Heart attack Paternal Uncle   . Bone cancer Paternal Uncle     Social History   Socioeconomic History  . Marital status: Married    Spouse name: Not on file  . Number of children: Not on file  . Years of education: Not on file    . Highest education level: Not on file  Occupational History  . Not on file  Social Needs  . Financial resource strain: Not on file  . Food insecurity:    Worry: Not on file    Inability: Not on file  . Transportation needs:    Medical: Not on file    Non-medical: Not on file  Tobacco Use  . Smoking status: Current Every Day Smoker    Packs/day: 1.50  . Smokeless tobacco: Never Used  . Tobacco comment: Since age 26.  Substance and Sexual Activity  . Alcohol use: Yes    Comment: Occasional  . Drug use: No  . Sexual activity: Not on file  Lifestyle  . Physical activity:    Days per week: Not on file    Minutes per session: Not on file  . Stress: Not on file  Relationships  . Social connections:    Talks on phone: Not on file    Gets together: Not on file    Attends religious service: Not on file    Active member of club or organization: Not on file    Attends meetings of clubs or organizations: Not on file    Relationship status: Not on file  . Intimate partner violence:    Fear of current or ex partner: Not on file    Emotionally abused: Not  on file    Physically abused: Not on file    Forced sexual activity: Not on file  Other Topics Concern  . Not on file  Social History Narrative  . Not on file    Outpatient Medications Prior to Visit  Medication Sig Dispense Refill  . aspirin 325 MG EC tablet Take 1 tablet (325 mg total) by mouth daily. 30 tablet 0  . atorvastatin (LIPITOR) 10 MG tablet Take 1 tablet (10 mg total) by mouth daily. 90 tablet 1  . lisinopril (PRINIVIL,ZESTRIL) 40 MG tablet Take 1 tablet (40 mg total) by mouth daily. 90 tablet 2  . metFORMIN (GLUCOPHAGE) 1000 MG tablet Take 1 tablet (1,000 mg total) by mouth 2 (two) times daily with a meal. 120 tablet 2  . omeprazole (PRILOSEC) 20 MG capsule Take 1 capsule (20 mg total) by mouth daily. 30 capsule 3  . thiamine 100 MG tablet Take 1 tablet (100 mg total) by mouth daily. 30 tablet 0  . traZODone  (DESYREL) 100 MG tablet Take 1 tablet (100 mg total) by mouth at bedtime as needed for sleep. 30 tablet 3  . insulin glargine (LANTUS) 100 UNIT/ML injection Inject 0.2 mLs (20 Units total) into the skin at bedtime. (Patient not taking: Reported on 11/04/2018) 10 mL 11  . Insulin Pen Needle (BD ULTRA-FINE PEN NEEDLES) 29G X 12.7MM MISC ICD10 E11.9 for use with insulin administration (Patient not taking: Reported on 11/04/2018) 200 each 2  . Insulin Syringes, Disposable, U-100 0.3 ML MISC 1 each by Does not apply route at bedtime. 100 each 11   No facility-administered medications prior to visit.     No Known Allergies  ROS Review of Systems  Constitutional: Negative.   Eyes: Negative.   Respiratory: Positive for cough (Ocassional) and shortness of breath (occassional).   Cardiovascular: Negative.   Gastrointestinal: Negative.   Endocrine: Negative.   Genitourinary: Negative.   Musculoskeletal: Negative.   Skin: Negative.   Allergic/Immunologic: Negative.   Neurological: Positive for dizziness and headaches.  Hematological: Negative.   Psychiatric/Behavioral: Negative.    Objective:    Physical Exam  Constitutional: She is oriented to person, place, and time. She appears well-developed and well-nourished.  HENT:  Head: Normocephalic and atraumatic.  Eyes: Conjunctivae are normal.  Neck: Normal range of motion. Neck supple.  Cardiovascular: Normal rate, regular rhythm, normal heart sounds and intact distal pulses.  Pulmonary/Chest: Effort normal and breath sounds normal.  Abdominal: Soft. Bowel sounds are normal.  Neurological: She is alert and oriented to person, place, and time. She has normal reflexes.  Skin: Skin is warm and dry.  Psychiatric: She has a normal mood and affect. Her behavior is normal. Judgment and thought content normal.  Nursing note and vitals reviewed.   BP (!) 160/70 (BP Location: Left Arm, Patient Position: Sitting, Cuff Size: Small)   Pulse 82   Temp  97.6 F (36.4 C) (Oral)   Ht 5\' 4"  (1.626 m)   Wt 163 lb (73.9 kg)   LMP 08/26/2018   SpO2 100%   BMI 27.98 kg/m  Wt Readings from Last 3 Encounters:  11/20/18 163 lb (73.9 kg)  11/04/18 163 lb 3.2 oz (74 kg)  08/17/18 163 lb 6.4 oz (74.1 kg)   Health Maintenance Due  Topic Date Due  . OPHTHALMOLOGY EXAM  11/28/1979    There are no preventive care reminders to display for this patient.  Lab Results  Component Value Date   TSH 1.530 12/29/2017  Lab Results  Component Value Date   WBC 5.9 12/29/2017   HGB 12.3 12/29/2017   HCT 38.1 12/29/2017   MCV 88 12/29/2017   PLT 189 12/29/2017   Lab Results  Component Value Date   NA 140 12/29/2017   K 4.2 12/29/2017   CO2 22 12/29/2017   GLUCOSE 211 (H) 12/29/2017   BUN 10 12/29/2017   CREATININE 0.56 (L) 12/29/2017   BILITOT 0.3 12/29/2017   ALKPHOS 102 12/29/2017   AST 15 12/29/2017   ALT 20 12/29/2017   PROT 7.0 12/29/2017   ALBUMIN 4.1 12/29/2017   CALCIUM 9.4 12/29/2017   ANIONGAP 7 12/16/2017   Lab Results  Component Value Date   CHOL 181 12/16/2017   Lab Results  Component Value Date   HDL 31 (L) 12/16/2017   Lab Results  Component Value Date   LDLCALC 133 (H) 12/16/2017   Lab Results  Component Value Date   TRIG 86 12/16/2017   Lab Results  Component Value Date   CHOLHDL 5.8 12/16/2017   Lab Results  Component Value Date   HGBA1C 12.3 (A) 11/20/2018   Assessment & Plan:   1. Type 2 diabetes mellitus without complication, without long-term current use of insulin (HCC) Worsening. Hgb A1c is at 12.3, from 9.3 on 08/18/2019. He will continue Insulin, and Metformin as prescribed. She will continue to decrease foods/beverages high in sugars and carbs and follow Heart Healthy or DASH diet. Increase physical activity to at least 30 minutes cardio exercise daily.  - POCT glycosylated hemoglobin (Hb A1C) - POCT urinalysis dipstick  2. Bilateral lower extremity edema 1+ edema. Continue Lasix as  prescribed.   3. Tobacco dependence  4. Shortness of breath  5. Encounter for smoking cessation counseling Counseled patient on the benefits of quitting smoking. She will contact office when she is ready to quit smoking.   6. Cough Stable today.   7. Follow up She will follow up in 1 month.   Meds ordered this encounter  Medications  . furosemide (LASIX) 20 MG tablet    Sig: Take 1 tablet (20 mg total) by mouth daily.    Dispense:  30 tablet    Refill:  1  . albuterol (PROVENTIL HFA;VENTOLIN HFA) 108 (90 Base) MCG/ACT inhaler    Sig: Inhale 2 puffs into the lungs every 6 (six) hours as needed for wheezing or shortness of breath.    Dispense:  1 Inhaler    Refill:  6   Orders Placed This Encounter  Procedures  . POCT glycosylated hemoglobin (Hb A1C)  . POCT urinalysis dipstick   Referral Orders  No referral(s) requested today    Problem List Items Addressed This Visit      Other   Tobacco dependence    Other Visit Diagnoses    Type 2 diabetes mellitus without complication, without long-term current use of insulin (HCC)    -  Primary   Relevant Orders   POCT glycosylated hemoglobin (Hb A1C) (Completed)   POCT urinalysis dipstick (Completed)   Bilateral lower extremity edema       Shortness of breath       Encounter for smoking cessation counseling       Cough       Follow up          No orders of the defined types were placed in this encounter.   Follow-up: Return in about 1 month (around 12/19/2018).    Kallie Locks, FNP

## 2018-11-20 NOTE — Patient Instructions (Addendum)
Shortness of Breath, Adult Shortness of breath means you have trouble breathing. Shortness of breath could be a sign of a medical problem. Follow these instructions at home:   Watch for any changes in your symptoms.  Do not use any products that contain nicotine or tobacco, such as cigarettes, e-cigarettes, and chewing tobacco.  Do not smoke. Smoking can cause shortness of breath. If you need help to quit smoking, ask your doctor.  Avoid things that can make it harder to breathe, such as: ? Mold. ? Dust. ? Air pollution. ? Chemical smells. ? Things that can cause allergy symptoms (allergens), if you have allergies.  Keep your living space clean. Use products that help remove mold and dust.  Rest as needed. Slowly return to your normal activities.  Take over-the-counter and prescription medicines only as told by your doctor. This includes oxygen therapy and inhaled medicines.  Keep all follow-up visits as told by your doctor. This is important. Contact a doctor if:  Your condition does not get better as soon as expected.  You have a hard time doing your normal activities, even after you rest.  You have new symptoms. Get help right away if:  Your shortness of breath gets worse.  You have trouble breathing when you are resting.  You feel light-headed or you pass out (faint).  You have a cough that is not helped by medicines.  You cough up blood.  You have pain with breathing.  You have pain in your chest, arms, shoulders, or belly (abdomen).  You have a fever.  You cannot walk up stairs.  You cannot exercise the way you normally do. These symptoms may represent a serious problem that is an emergency. Do not wait to see if the symptoms will go away. Get medical help right away. Call your local emergency services (911 in the [place] Do not drive yourself to the hospital. Summary  Shortness of breath is when you have trouble breathing enough air. It can be a sign of a  medical problem.  Avoid things that make it hard for you to breathe, such as smoking, pollution, mold, and dust.  Watch for any changes in your symptoms. Contact your doctor if you do not get better or you get worse. This information is not intended to replace advice given to you by your health care provider. Make sure you discuss any questions you have with your health care provider. Document Released: 03/11/2008 Document Revised: 02/23/2018 Document Reviewed: 02/23/2018 Elsevier Interactive Patient Education  2019 Elsevier Inc. Albuterol inhalation aerosol What is this medicine? ALBUTEROL (al [place]) is a bronchodilator. It helps open up the airways in your lungs to make it easier to breathe. This medicine is used to treat and to prevent bronchospasm. This medicine may be used for other purposes; ask your health care provider or pharmacist if you have questions. COMMON BRAND NAME(S): Proair HFA, Proventil, Proventil HFA, Respirol, Ventolin, Ventolin HFA What should I tell my health care provider before I take this medicine? They need to know if you have any of the following conditions: -diabetes -heart disease or irregular heartbeat -high blood pressure -pheochromocytoma -seizures -thyroid disease -an unusual or allergic reaction to albuterol, levalbuterol, sulfites, other medicines, foods, dyes, or preservatives -pregnant or trying to get pregnant -breast-feeding How should I use this medicine? This medicine is for inhalation through the mouth. Follow the directions on your prescription label. Take your medicine at regular intervals. Do not use more often than directed. Make  sure that you are using your inhaler correctly. Ask you doctor or health care provider if you have any questions. Talk to your pediatrician regarding the use of this medicine in children. Special care may be needed. Overdosage: If you think you have taken too much of this medicine contact a poison control  center or emergency room at once. NOTE: This medicine is only for you. Do not share this medicine with others. What if I miss a dose? If you miss a dose, use it as soon as you can. If it is almost time for your next dose, use only that dose. Do not use double or extra doses. What may interact with this medicine? -anti-infectives like chloroquine and pentamidine -caffeine -cisapride -diuretics -medicines for colds -medicines for depression or for emotional or psychotic conditions -medicines for weight loss including some herbal products -methadone -some antibiotics like clarithromycin, erythromycin, levofloxacin, and linezolid -some heart medicines -steroid hormones like dexamethasone, cortisone, hydrocortisone -theophylline -thyroid hormones This list may not describe all possible interactions. Give your health care provider a list of all the medicines, herbs, non-prescription drugs, or dietary supplements you use. Also tell them if you smoke, drink alcohol, or use illegal drugs. Some items may interact with your medicine. What should I watch for while using this medicine? Tell your doctor or health care professional if your symptoms do not improve. Do not use extra albuterol. If your asthma or bronchitis gets worse while you are using this medicine, call your doctor right away. If your mouth gets dry try chewing sugarless gum or sucking hard candy. Drink water as directed. What side effects may I notice from receiving this medicine? Side effects that you should report to your doctor or health care professional as soon as possible: -allergic reactions like skin rash, itching or hives, swelling of the face, lips, or tongue -breathing problems -chest pain -feeling faint or lightheaded, falls -high blood pressure -irregular heartbeat -fever -muscle cramps or weakness -pain, tingling, numbness in the hands or feet -vomiting Side effects that usually do not require medical attention  (report to your doctor or health care professional if they continue or are bothersome): -cough -difficulty sleeping -headache -nervousness or trembling -stomach upset -stuffy or runny nose -throat irritation -unusual taste This list may not describe all possible side effects. Call your doctor for medical advice about side effects. You may report side effects to FDA at 1-800-FDA-1088. Where should I keep my medicine? Keep out of the reach of children. Store at room temperature between 15 and 30 degrees C (59 and 86 degrees F). The contents are under pressure and may burst when exposed to heat or flame. Do not freeze. This medicine does not work as well if it is too cold. Throw away any unused medicine after the expiration date. Inhalers need to be thrown away after the labeled number of puffs have been used or by the expiration date; whichever comes first. Ventolin HFA should be thrown away 12 months after removing from foil pouch. Check the instructions that come with your medicine. NOTE: This sheet is a summary. It may not cover all possible information. If you have questions about this medicine, talk to your doctor, pharmacist, or health care provider.  2019 Elsevier/Gold Standard (2013-03-11 10:57:17) Furosemide tablets What is this medicine? FUROSEMIDE (fyoor [place] se mide) is a diuretic. It helps you make more urine and to lose salt and excess water from your body. This medicine is used to treat high blood pressure, and edema or  swelling from heart, kidney, or liver disease. This medicine may be used for other purposes; ask your health care provider or pharmacist if you have questions. COMMON BRAND NAME(S): Active-Medicated Specimen Kit, Delone, Diuscreen, Lasix, RX Specimen Collection Kit, Specimen Collection Kit, URINX Medicated Specimen Collection What should I tell my health care provider before I take this medicine? They need to know if you have any of these conditions: -abnormal blood  electrolytes -diarrhea or vomiting -gout -heart disease -kidney disease, small amounts of urine, or difficulty passing urine -liver disease -thyroid disease -an unusual or allergic reaction to furosemide, sulfa drugs, other medicines, foods, dyes, or preservatives -pregnant or trying to get pregnant -breast-feeding How should I use this medicine? Take this medicine by mouth with a glass of water. Follow the directions on the prescription label. You may take this medicine with or without food. If it upsets your stomach, take it with food or milk. Do not take your medicine more often than directed. Remember that you will need to pass more urine after taking this medicine. Do not take your medicine at a time of day that will cause you problems. Do not take at bedtime. Talk to your pediatrician regarding the use of this medicine in children. While this drug may be prescribed for selected conditions, precautions do apply. Overdosage: If you think you have taken too much of this medicine contact a poison control center or emergency room at once. NOTE: This medicine is only for you. Do not share this medicine with others. What if I miss a dose? If you miss a dose, take it as soon as you can. If it is almost time for your next dose, take only that dose. Do not take double or extra doses. What may interact with this medicine? -aspirin and aspirin-like medicines -certain antibiotics -chloral hydrate -cisplatin -cyclosporine -digoxin -diuretics -laxatives -lithium -medicines for blood pressure -medicines that relax muscles for surgery -methotrexate -NSAIDs, medicines for pain and inflammation like ibuprofen, naproxen, or indomethacin -phenytoin -steroid medicines like prednisone or cortisone -sucralfate -thyroid hormones This list may not describe all possible interactions. Give your health care provider a list of all the medicines, herbs, non-prescription drugs, or dietary supplements you  use. Also tell them if you smoke, drink alcohol, or use illegal drugs. Some items may interact with your medicine. What should I watch for while using this medicine? Visit your doctor or health care professional for regular checks on your progress. Check your blood pressure regularly. Ask your doctor or health care professional what your blood pressure should be, and when you should contact him or her. If you are a diabetic, check your blood sugar as directed. You may need to be on a special diet while taking this medicine. Check with your doctor. Also, ask how many glasses of fluid you need to drink a day. You must not get dehydrated. You may get drowsy or dizzy. Do not drive, use machinery, or do anything that needs mental alertness until you know how this drug affects you. Do not stand or sit up quickly, especially if you are an older patient. This reduces the risk of dizzy or fainting spells. Alcohol can make you more drowsy and dizzy. Avoid alcoholic drinks. This medicine can make you more sensitive to the sun. Keep out of the sun. If you cannot avoid being in the sun, wear protective clothing and use sunscreen. Do not use sun lamps or tanning beds/booths. What side effects may I notice from receiving this medicine? Side  effects that you should report to your doctor or health care professional as soon as possible: -blood in urine or stools -dry mouth -fever or chills -hearing loss or ringing in the ears -irregular heartbeat -muscle pain or weakness, cramps -skin rash -stomach upset, pain, or nausea -tingling or numbness in the hands or feet -unusually weak or tired -vomiting or diarrhea -yellowing of the eyes or skin Side effects that usually do not require medical attention (report to your doctor or health care professional if they continue or are bothersome): -headache -loss of appetite -unusual bleeding or bruising This list may not describe all possible side effects. Call your doctor  for medical advice about side effects. You may report side effects to FDA at 1-800-FDA-1088. Where should I keep my medicine? Keep out of the reach of children. Store at room temperature between 15 and 30 degrees C (59 and 86 degrees F). Protect from light. Throw away any unused medicine after the expiration date. NOTE: This sheet is a summary. It may not cover all possible information. If you have questions about this medicine, talk to your doctor, pharmacist, or health care provider.  2019 Elsevier/Gold Standard (2014-12-14 13:49:50) Edema  Edema is when you have too much fluid in your body or under your skin. Edema may make your legs, feet, and ankles swell up. Swelling is also common in looser tissues, like around your eyes. This is a common condition. It gets more common as you get older. There are many possible causes of edema. Eating too much salt (sodium) and being on your feet or sitting for a long time can cause edema in your legs, feet, and ankles. Hot weather may make edema worse. Edema is usually painless. Your skin may look swollen or shiny. Follow these instructions at home:  Keep the swollen body part raised (elevated) above the level of your heart when you are sitting or lying down.  Do not sit still or stand for a long time.  Do not wear tight clothes. Do not wear garters on your upper legs.  Exercise your legs. This can help the swelling go down.  Wear elastic bandages or support stockings as told by your doctor.  Eat a low-salt (low-sodium) diet to reduce fluid as told by your doctor.  Depending on the cause of your swelling, you may need to limit how much fluid you drink (fluid restriction).  Take over-the-counter and prescription medicines only as told by your doctor. Contact a doctor if:  Treatment is not working.  You have heart, liver, or kidney disease and have symptoms of edema.  You have sudden and unexplained weight gain. Get help right away if:  You  have shortness of breath or chest pain.  You cannot breathe when you lie down.  You have pain, redness, or warmth in the swollen areas.  You have heart, liver, or kidney disease and get edema all of a sudden.  You have a fever and your symptoms get worse all of a sudden. Summary  Edema is when you have too much fluid in your body or under your skin.  Edema may make your legs, feet, and ankles swell up. Swelling is also common in looser tissues, like around your eyes.  Raise (elevate) the swollen body part above the level of your heart when you are sitting or lying down.  Follow your doctor's instructions about diet and how much fluid you can drink (fluid restriction). This information is not intended to replace advice given to  you by your health care provider. Make sure you discuss any questions you have with your health care provider. Document Released: 03/11/2008 Document Revised: 10/11/2016 Document Reviewed: 10/11/2016 Elsevier Interactive Patient Education  2019 [place] DASH stands for "Dietary Approaches to Stop Hypertension." The DASH eating plan is a healthy eating plan that has been shown to reduce high blood pressure (hypertension). It may also reduce your risk for type 2 diabetes, heart disease, and stroke. The DASH eating plan may also help with weight loss. What are tips for following this plan?  General guidelines  Avoid eating more than 2,300 mg (milligrams) of salt (sodium) a day. If you have hypertension, you may need to reduce your sodium intake to 1,500 mg a day.  Limit alcohol intake to no more than 1 drink a day for nonpregnant women and 2 drinks a day for men. One drink equals 12 oz of beer, 5 oz of wine, or 1 oz of hard liquor.  Work with your health care provider to maintain a healthy body weight or to lose weight. Ask what an ideal weight is for you.  Get at least 30 minutes of exercise that causes your heart to beat faster (aerobic  exercise) most days of the week. Activities may include walking, swimming, or biking.  Work with your health care provider or diet and nutrition specialist (dietitian) to adjust your eating plan to your individual calorie needs. Reading food labels   Check food labels for the amount of sodium per serving. Choose foods with less than 5 percent of the Daily Value of sodium. Generally, foods with less than 300 mg of sodium per serving fit into this eating plan.  To find whole grains, look for the word "whole" as the first word in the ingredient list. Shopping  Buy products labeled as "low-sodium" or "no salt added."  Buy fresh foods. Avoid canned foods and premade or frozen meals. Cooking  Avoid adding salt when cooking. Use salt-free seasonings or herbs instead of table salt or sea salt. Check with your health care provider or pharmacist before using salt substitutes.  Do not fry foods. Cook foods using healthy methods such as baking, boiling, grilling, and broiling instead.  Cook with heart-healthy oils, such as olive, canola, soybean, or sunflower oil. Meal planning  Eat a balanced diet that includes: ? 5 or more servings of fruits and vegetables each day. At each meal, try to fill half of your plate with fruits and vegetables. ? Up to 6-8 servings of whole grains each day. ? Less than 6 oz of lean meat, poultry, or fish each day. A 3-oz serving of meat is about the same size as a deck of cards. One egg equals 1 oz. ? 2 servings of low-fat dairy each day. ? A serving of nuts, seeds, or beans 5 times each week. ? Heart-healthy fats. Healthy fats called Omega-3 fatty acids are found in foods such as flaxseeds and coldwater fish, like sardines, salmon, and mackerel.  Limit how much you eat of the following: ? Canned or prepackaged foods. ? Food that is high in trans fat, such as fried foods. ? Food that is high in saturated fat, such as fatty meat. ? Sweets, desserts, sugary drinks,  and other foods with added sugar. ? Full-fat dairy products.  Do not salt foods before eating.  Try to eat at least 2 vegetarian meals each week.  Eat more home-cooked food and less restaurant, buffet, and fast food.  When eating at a restaurant, ask that your food be prepared with less salt or no salt, if possible. What foods are recommended? The items listed may not be a complete list. Talk with your dietitian about what dietary choices are best for you. Grains Whole-grain or whole-wheat bread. Whole-grain or whole-wheat pasta. Brown rice. [place]. Bulgur. Whole-grain and low-sodium cereals. Pita bread. Low-fat, low-sodium crackers. Whole-wheat flour tortillas. Vegetables Fresh or frozen vegetables (raw, steamed, roasted, or grilled). Low-sodium or reduced-sodium tomato and vegetable juice. Low-sodium or reduced-sodium tomato sauce and tomato paste. Low-sodium or reduced-sodium canned vegetables. Fruits All fresh, dried, or frozen fruit. Canned fruit in natural juice (without added sugar). Meat and other protein foods Skinless chicken or [place]. Ground chicken or [place]. Pork with fat trimmed off. Fish and seafood. Egg whites. Dried beans, peas, or lentils. Unsalted nuts, nut butters, and seeds. Unsalted canned beans. Lean cuts of beef with fat trimmed off. Low-sodium, lean deli meat. Dairy Low-fat (1%) or fat-free (skim) milk. Fat-free, low-fat, or reduced-fat cheeses. Nonfat, low-sodium ricotta or cottage cheese. Low-fat or nonfat yogurt. Low-fat, low-sodium cheese. Fats and oils Soft margarine without trans fats. Vegetable oil. Low-fat, reduced-fat, or light mayonnaise and salad dressings (reduced-sodium). Canola, safflower, olive, soybean, and sunflower oils. Avocado. Seasoning and other foods Herbs. Spices. Seasoning mixes without salt. Unsalted popcorn and pretzels. Fat-free sweets. What foods are not recommended? The items listed may not be a complete list. Talk with your  dietitian about what dietary choices are best for you. Grains Baked goods made with fat, such as croissants, muffins, or some breads. Dry pasta or rice meal packs. Vegetables Creamed or fried vegetables. Vegetables in a cheese sauce. Regular canned vegetables (not low-sodium or reduced-sodium). Regular canned tomato sauce and paste (not low-sodium or reduced-sodium). Regular tomato and vegetable juice (not low-sodium or reduced-sodium). Angie Fava. Olives. Fruits Canned fruit in a light or heavy syrup. Fried fruit. Fruit in cream or butter sauce. Meat and other protein foods Fatty cuts of meat. Ribs. Fried meat. Berniece Salines. Sausage. Bologna and other processed lunch meats. Salami. Fatback. Hotdogs. Bratwurst. Salted nuts and seeds. Canned beans with added salt. Canned or smoked fish. Whole eggs or egg yolks. Chicken or [place] with skin. Dairy Whole or 2% milk, cream, and half-and-half. Whole or full-fat cream cheese. Whole-fat or sweetened yogurt. Full-fat cheese. Nondairy creamers. Whipped toppings. Processed cheese and cheese spreads. Fats and oils Butter. Stick margarine. Lard. Shortening. Ghee. Bacon fat. Tropical oils, such as coconut, palm kernel, or palm oil. Seasoning and other foods Salted popcorn and pretzels. Onion salt, garlic salt, seasoned salt, table salt, and sea salt. Worcestershire sauce. Tartar sauce. Barbecue sauce. Teriyaki sauce. Soy sauce, including reduced-sodium. Steak sauce. Canned and packaged gravies. Fish sauce. Oyster sauce. Cocktail sauce. Horseradish that you find on the shelf. Ketchup. Mustard. Meat flavorings and tenderizers. Bouillon cubes. Hot sauce and Tabasco sauce. Premade or packaged marinades. Premade or packaged taco seasonings. Relishes. Regular salad dressings. Where to find more information:  National Heart, Lung, and [place]: https://wilson-eaton.com/  American Heart Association: www.heart.org Summary  The DASH eating plan is a healthy eating plan that has  been shown to reduce high blood pressure (hypertension). It may also reduce your risk for type 2 diabetes, heart disease, and stroke.  With the DASH eating plan, you should limit salt (sodium) intake to 2,300 mg a day. If you have hypertension, you may need to reduce your sodium intake to 1,500 mg a day.  When on the DASH eating plan,  aim to eat more fresh fruits and vegetables, whole grains, lean proteins, low-fat dairy, and heart-healthy fats.  Work with your health care provider or diet and nutrition specialist (dietitian) to adjust your eating plan to your individual calorie needs. This information is not intended to replace advice given to you by your health care provider. Make sure you discuss any questions you have with your health care provider. Document Released: 09/12/2011 Document Revised: 09/16/2016 Document Reviewed: 09/16/2016 Elsevier Interactive Patient Education  2019 [place] Heart-healthy meal planning includes:  Eating less unhealthy fats.  Eating more healthy fats.  Making other changes in your diet. Talk with your doctor or a diet specialist (dietitian) to create an eating plan that is right for you. What is my plan? Your doctor may recommend an eating plan that includes:  Total fat: ______% or less of total calories a day.  Saturated fat: ______% or less of total calories a day.  Cholesterol: less than _________mg a day. What are tips for following this plan? Cooking Avoid frying your food. Try to bake, boil, grill, or broil it instead. You can also reduce fat by:  Removing the skin from poultry.  Removing all visible fats from meats.  Steaming vegetables in water or broth. Meal planning   At meals, divide your plate into four equal parts: ? Fill one-half of your plate with vegetables and green salads. ? Fill one-fourth of your plate with whole grains. ? Fill one-fourth of your plate with lean protein foods.  Eat 4-5  servings of vegetables per day. A serving of vegetables is: ? 1 cup of raw or cooked vegetables. ? 2 cups of raw leafy greens.  Eat 4-5 servings of fruit per day. A serving of fruit is: ? 1 medium whole fruit. ?  cup of dried fruit. ?  cup of fresh, frozen, or canned fruit. ?  cup of 100% fruit juice.  Eat more foods that have soluble fiber. These are apples, broccoli, carrots, beans, peas, and barley. Try to get 20-30 g of fiber per day.  Eat 4-5 servings of nuts, legumes, and seeds per week: ? 1 serving of dried beans or legumes equals  cup after being cooked. ? 1 serving of nuts is  cup. ? 1 serving of seeds equals 1 tablespoon. General information  Eat more home-cooked food. Eat less restaurant, buffet, and fast food.  Limit or avoid alcohol.  Limit foods that are high in starch and sugar.  Avoid fried foods.  Lose weight if you are overweight.  Keep track of how much salt (sodium) you eat. This is important if you have high blood pressure. Ask your doctor to tell you more about this.  Try to add vegetarian meals each week. Fats  Choose healthy fats. These include olive oil and canola oil, flaxseeds, walnuts, almonds, and seeds.  Eat more omega-3 fats. These include salmon, mackerel, sardines, tuna, flaxseed oil, and ground flaxseeds. Try to eat fish at least 2 times each week.  Check food labels. Avoid foods with trans fats or high amounts of saturated fat.  Limit saturated fats. ? These are often found in animal products, such as meats, butter, and cream. ? These are also found in plant foods, such as palm oil, palm kernel oil, and coconut oil.  Avoid foods with partially hydrogenated oils in them. These have trans fats. Examples are stick margarine, some tub margarines, cookies, crackers, and other baked goods. What foods can I eat? Fruits  All fresh, canned (in natural juice), or frozen fruits. Vegetables Fresh or frozen vegetables (raw, steamed, roasted,  or grilled). Green salads. Grains Most grains. Choose whole wheat and whole grains most of the time. Rice and pasta, including brown rice and pastas made with whole wheat. Meats and other proteins Lean, well-trimmed beef, veal, pork, and lamb. Chicken and [place] without skin. All fish and shellfish. Wild duck, rabbit, pheasant, and venison. Egg whites or low-cholesterol egg substitutes. Dried beans, peas, lentils, and tofu. Seeds and most nuts. Dairy Low-fat or nonfat cheeses, including ricotta and mozzarella. Skim or 1% milk that is liquid, powdered, or evaporated. Buttermilk that is made with low-fat milk. Nonfat or low-fat yogurt. Fats and oils Non-hydrogenated (trans-free) margarines. Vegetable oils, including soybean, sesame, sunflower, olive, peanut, safflower, corn, canola, and cottonseed. Salad dressings or mayonnaise made with a vegetable oil. Beverages Mineral water. Coffee and tea. Diet carbonated beverages. Sweets and desserts Sherbet, gelatin, and fruit ice. Small amounts of dark chocolate. Limit all sweets and desserts. Seasonings and condiments All seasonings and condiments. The items listed above may not be a complete list of foods and drinks you can eat. Contact a dietitian for more options. What foods should I avoid? Fruits Canned fruit in heavy syrup. Fruit in cream or butter sauce. Fried fruit. Limit coconut. Vegetables Vegetables cooked in cheese, cream, or butter sauce. Fried vegetables. Grains Breads that are made with saturated or trans fats, oils, or whole milk. Croissants. Sweet rolls. Donuts. High-fat crackers, such as cheese crackers. Meats and other proteins Fatty meats, such as hot dogs, ribs, sausage, bacon, rib-eye roast or steak. High-fat deli meats, such as salami and bologna. Caviar. Domestic duck and goose. Organ meats, such as liver. Dairy Cream, sour cream, cream cheese, and creamed cottage cheese. Whole-milk cheeses. Whole or 2% milk that is liquid,  evaporated, or condensed. Whole buttermilk. Cream sauce or high-fat cheese sauce. Yogurt that is made from whole milk. Fats and oils Meat fat, or shortening. Cocoa butter, hydrogenated oils, palm oil, coconut oil, palm kernel oil. Solid fats and shortenings, including bacon fat, salt pork, lard, and butter. Nondairy cream substitutes. Salad dressings with cheese or sour cream. Beverages Regular sodas and juice drinks with added sugar. Sweets and desserts Frosting. Pudding. Cookies. Cakes. Pies. Milk chocolate or white chocolate. Buttered syrups. Full-fat ice cream or ice cream drinks. The items listed above may not be a complete list of foods and drinks to avoid. Contact a dietitian for more information. Summary  Heart-healthy meal planning includes eating less unhealthy fats, eating more healthy fats, and making other changes in your diet.  Eat a balanced diet. This includes fruits and vegetables, low-fat or nonfat dairy, lean protein, nuts and legumes, whole grains, and heart-healthy oils and fats. This information is not intended to replace advice given to you by your health care provider. Make sure you discuss any questions you have with your health care provider. Document Released: 03/24/2012 Document Revised: 10/31/2017 Document Reviewed: 10/31/2017 Elsevier Interactive Patient Education  2019 [employer].

## 2018-11-24 ENCOUNTER — Encounter: Payer: Self-pay | Admitting: Family Medicine

## 2018-11-24 DIAGNOSIS — R600 Localized edema: Secondary | ICD-10-CM | POA: Insufficient documentation

## 2018-11-24 DIAGNOSIS — E119 Type 2 diabetes mellitus without complications: Secondary | ICD-10-CM | POA: Insufficient documentation

## 2018-12-25 ENCOUNTER — Ambulatory Visit: Payer: Self-pay | Admitting: Family Medicine

## 2018-12-30 ENCOUNTER — Other Ambulatory Visit: Payer: Self-pay

## 2018-12-30 ENCOUNTER — Telehealth (INDEPENDENT_AMBULATORY_CARE_PROVIDER_SITE_OTHER): Payer: Self-pay | Admitting: Family Medicine

## 2018-12-30 DIAGNOSIS — E119 Type 2 diabetes mellitus without complications: Secondary | ICD-10-CM

## 2018-12-30 DIAGNOSIS — R05 Cough: Secondary | ICD-10-CM

## 2018-12-30 DIAGNOSIS — Z91120 Patient's intentional underdosing of medication regimen due to financial hardship: Secondary | ICD-10-CM

## 2018-12-30 DIAGNOSIS — F17210 Nicotine dependence, cigarettes, uncomplicated: Secondary | ICD-10-CM

## 2018-12-30 MED ORDER — GLIPIZIDE 10 MG PO TABS
10.0000 mg | ORAL_TABLET | Freq: Two times a day (BID) | ORAL | 3 refills | Status: DC
Start: 2018-12-30 — End: 2019-05-11

## 2018-12-30 NOTE — Progress Notes (Signed)
Virtual Visit via Telephone Note  I connected with Misty Mccullough on 12/30/18 at  1:20 PM EDT by telephone and verified that I am speaking with the correct person using two identifiers.   I discussed the limitations, risks, security and privacy concerns of performing an evaluation and management service by telephone and the availability of in person appointments. I also discussed with the patient that there may be a patient responsible charge related to this service. The patient expressed understanding and agreed to proceed.   History of Present Illness:  Past Medical History:  Diagnosis Date  . Bilateral lower extremity edema   . Cough   . Diabetes mellitus without complication (HCC)   . Gallstones   . Heavy cigarette smoker   . Hypercholesteremia   . Hypertension   . Shortness of breath     Current Outpatient Medications on File Prior to Visit  Medication Sig Dispense Refill  . albuterol (PROVENTIL HFA;VENTOLIN HFA) 108 (90 Base) MCG/ACT inhaler Inhale 2 puffs into the lungs every 6 (six) hours as needed for wheezing or shortness of breath. 1 Inhaler 6  . aspirin 325 MG EC tablet Take 1 tablet (325 mg total) by mouth daily. 30 tablet 0  . atorvastatin (LIPITOR) 10 MG tablet Take 1 tablet (10 mg total) by mouth daily. 90 tablet 1  . furosemide (LASIX) 20 MG tablet Take 1 tablet (20 mg total) by mouth daily. 30 tablet 1  . insulin glargine (LANTUS) 100 UNIT/ML injection Inject 0.2 mLs (20 Units total) into the skin at bedtime. (Patient not taking: Reported on 11/04/2018) 10 mL 11  . Insulin Pen Needle (BD ULTRA-FINE PEN NEEDLES) 29G X 12.7MM MISC ICD10 E11.9 for use with insulin administration (Patient not taking: Reported on 11/04/2018) 200 each 2  . Insulin Syringes, Disposable, U-100 0.3 ML MISC 1 each by Does not apply route at bedtime. 100 each 11  . lisinopril (PRINIVIL,ZESTRIL) 40 MG tablet Take 1 tablet (40 mg total) by mouth daily. 90 tablet 2  . metFORMIN (GLUCOPHAGE) 1000 MG  tablet Take 1 tablet (1,000 mg total) by mouth 2 (two) times daily with a meal. 120 tablet 2  . omeprazole (PRILOSEC) 20 MG capsule Take 1 capsule (20 mg total) by mouth daily. 30 capsule 3  . thiamine 100 MG tablet Take 1 tablet (100 mg total) by mouth daily. 30 tablet 0  . traZODone (DESYREL) 100 MG tablet Take 1 tablet (100 mg total) by mouth at bedtime as needed for sleep. 30 tablet 3   No current facility-administered medications on file prior to visit.    Current Status: Since her last office visit, she is doing well with no complaints. She continues to smoke 1 pack of cigarettes a day, which she r/t her chronic cough. Her blood glucose level is elevated, but she states that she cannot afford Lantus at this time. She has not completed paperwork at [facility] for pharmacy benefits. She has not been taking her preprandial blood glucose levels lately. She denies fatigue, frequent urination, blurred vision, excessive hunger, excessive thirst, weight gain, weight loss, and poor wound healing. She denies visual changes, chest pain, cough, shortness of breath, heart palpitations, and falls. She has occasional headaches and dizziness with position changes. Denies severe headaches, confusion, seizures, double vision, and blurred vision, nausea and vomiting.  She denies fevers, chills, recent infections, weight loss, and night sweats. She has not had any visual changes, and falls. No chest pain, heart palpitations, cough and shortness  of breath reported. No reports of GI problems such as diarrhea, and constipation. She has no reports of blood in stools, dysuria and hematuria. No depression or anxiety reported. She denies pain today.   Observations/Objective:  Telephone Virtual Visit   Assessment and Plan:  1. Type 2 diabetes mellitus without complication, without long-term current use of insulin (HCC) Most recent Hgb A1c is elevated at 12.3 on 2/14/202, from 9.3 on 08/17/2018.  Patient unable to afford Insulin. We will add Glucotrol 10 mg BID today. She will continue Metformin as prescribed. She will complete application for Prescription Assistance at [facility]).  - glipiZIDE (GLUCOTROL) 10 MG tablet; Take 1 tablet (10 mg total) by mouth 2 (two) times daily before a meal.  Dispense: 60 tablet; Refill: 3  2. Follow Up She will follow up in 3 months.    Follow Up Instructions: She will follow up in 3 month.    Meds ordered this encounter  Medications  . glipiZIDE (GLUCOTROL) 10 MG tablet    Sig: Take 1 tablet (10 mg total) by mouth 2 (two) times daily before a meal.    Dispense:  60 tablet    Refill:  3    No orders of the defined types were placed in this encounter.   Referral Orders  No referral(s) requested today    Raliegh Ip,  MSN, FNP-C [facility] [address] [place], [place] [address] [phone]    I discussed the assessment and treatment plan with the patient. The patient was provided an opportunity to ask questions and all were answered. The patient agreed with the plan and demonstrated an understanding of the instructions.   The patient was advised to call back or seek an in-person evaluation if the symptoms worsen or if the condition fails to improve as anticipated.  I provided 15-20 minutes of non-face-to-face time during this encounter.   Kallie Locks, FNP

## 2019-01-11 ENCOUNTER — Telehealth: Payer: Self-pay

## 2019-01-11 ENCOUNTER — Other Ambulatory Visit: Payer: Self-pay | Admitting: Family Medicine

## 2019-01-11 ENCOUNTER — Other Ambulatory Visit: Payer: Self-pay

## 2019-01-11 DIAGNOSIS — I10 Essential (primary) hypertension: Secondary | ICD-10-CM

## 2019-01-11 MED ORDER — LISINOPRIL 40 MG PO TABS: 40.0000 mg | ORAL_TABLET | Freq: Every day | ORAL | 3 refills | Status: DC

## 2019-01-11 NOTE — Telephone Encounter (Signed)
Tried to contact patient no answer 

## 2019-01-12 NOTE — Telephone Encounter (Signed)
Patient notified

## 2019-01-13 ENCOUNTER — Telehealth: Payer: Self-pay

## 2019-01-13 ENCOUNTER — Other Ambulatory Visit: Payer: Self-pay | Admitting: Family Medicine

## 2019-01-13 DIAGNOSIS — R600 Localized edema: Secondary | ICD-10-CM

## 2019-01-13 MED ORDER — FUROSEMIDE 20 MG PO TABS: 20.0000 mg | ORAL_TABLET | Freq: Two times a day (BID) | ORAL | 3 refills | Status: DC | PRN

## 2019-01-13 NOTE — Telephone Encounter (Signed)
Patient notified

## 2019-01-13 NOTE — Telephone Encounter (Signed)
Patient states that she is still having some foot swelling and would like to know if she can up the dose on her Lasix. Patient is aware that you are out of office

## 2019-02-03 ENCOUNTER — Other Ambulatory Visit: Payer: Self-pay | Admitting: Family Medicine

## 2019-02-03 DIAGNOSIS — E1165 Type 2 diabetes mellitus with hyperglycemia: Principal | ICD-10-CM

## 2019-02-03 DIAGNOSIS — IMO0001 Reserved for inherently not codable concepts without codable children: Secondary | ICD-10-CM

## 2019-02-16 ENCOUNTER — Other Ambulatory Visit: Payer: Self-pay

## 2019-02-16 DIAGNOSIS — IMO0001 Reserved for inherently not codable concepts without codable children: Secondary | ICD-10-CM

## 2019-02-16 DIAGNOSIS — E119 Type 2 diabetes mellitus without complications: Secondary | ICD-10-CM

## 2019-02-16 MED ORDER — ATORVASTATIN CALCIUM 10 MG PO TABS: 10.0000 mg | ORAL_TABLET | Freq: Every day | ORAL | 1 refills | Status: DC

## 2019-02-16 MED ORDER — METFORMIN HCL 1000 MG PO TABS: 1000.0000 mg | ORAL_TABLET | Freq: Two times a day (BID) | ORAL | 2 refills | Status: DC

## 2019-02-16 NOTE — Telephone Encounter (Signed)
Medications sent to pharmacy

## 2019-03-23 ENCOUNTER — Telehealth: Payer: Self-pay

## 2019-03-23 NOTE — Telephone Encounter (Signed)
Patient advise that she still has refills on the Furosemide and can contact pharmacy for refill

## 2019-03-29 ENCOUNTER — Other Ambulatory Visit: Payer: Self-pay | Admitting: Family Medicine

## 2019-03-29 ENCOUNTER — Telehealth: Payer: Self-pay | Admitting: Family Medicine

## 2019-03-29 DIAGNOSIS — R600 Localized edema: Secondary | ICD-10-CM

## 2019-03-29 NOTE — Telephone Encounter (Signed)
Patient needs the Lasix refill. Patient states that pharmacy showed no refills on last script that was sent in.

## 2019-03-30 ENCOUNTER — Other Ambulatory Visit: Payer: Self-pay | Admitting: Family Medicine

## 2019-03-30 DIAGNOSIS — R600 Localized edema: Secondary | ICD-10-CM

## 2019-03-30 MED ORDER — FUROSEMIDE 20 MG PO TABS: 20.0000 mg | ORAL_TABLET | Freq: Two times a day (BID) | ORAL | 3 refills | Status: DC | PRN

## 2019-03-30 NOTE — Addendum Note (Signed)
Addended by: Jefferson Fuel on: 03/30/2019 01:39 PM   Modules accepted: Orders

## 2019-03-30 NOTE — Telephone Encounter (Signed)
Medication sen to pharmacy and vm left for patient

## 2019-04-01 ENCOUNTER — Ambulatory Visit: Payer: Self-pay | Admitting: Family Medicine

## 2019-04-21 ENCOUNTER — Encounter: Payer: Self-pay | Admitting: Family Medicine

## 2019-04-21 ENCOUNTER — Ambulatory Visit (INDEPENDENT_AMBULATORY_CARE_PROVIDER_SITE_OTHER): Payer: Self-pay | Admitting: Family Medicine

## 2019-04-21 ENCOUNTER — Other Ambulatory Visit: Payer: Self-pay

## 2019-04-21 VITALS — BP 140/76 | HR 96 | Temp 98.7°F | Ht 64.0 in | Wt 165.8 lb

## 2019-04-21 DIAGNOSIS — E119 Type 2 diabetes mellitus without complications: Secondary | ICD-10-CM

## 2019-04-21 DIAGNOSIS — G4700 Insomnia, unspecified: Secondary | ICD-10-CM

## 2019-04-21 DIAGNOSIS — Z09 Encounter for follow-up examination after completed treatment for conditions other than malignant neoplasm: Secondary | ICD-10-CM

## 2019-04-21 DIAGNOSIS — R600 Localized edema: Secondary | ICD-10-CM

## 2019-04-21 DIAGNOSIS — I10 Essential (primary) hypertension: Secondary | ICD-10-CM

## 2019-04-21 LAB — POCT URINALYSIS DIP (MANUAL ENTRY)
Bilirubin, UA: NEGATIVE
Blood, UA: NEGATIVE
Glucose, UA: NEGATIVE mg/dL
Ketones, POC UA: NEGATIVE mg/dL
Leukocytes, UA: NEGATIVE
Nitrite, UA: NEGATIVE
Protein Ur, POC: NEGATIVE mg/dL
Spec Grav, UA: 1.01 (ref 1.010–1.025)
Urobilinogen, UA: 0.2 E.U./dL
pH, UA: 5.5 (ref 5.0–8.0)

## 2019-04-21 LAB — GLUCOSE, POCT (MANUAL RESULT ENTRY): POC Glucose: 105 mg/dl — AB (ref 70–99)

## 2019-04-21 LAB — POCT GLYCOSYLATED HEMOGLOBIN (HGB A1C): Hemoglobin A1C: 9.1 % — AB (ref 4.0–5.6)

## 2019-04-21 MED ORDER — FUROSEMIDE 20 MG PO TABS: 20.0000 mg | ORAL_TABLET | Freq: Two times a day (BID) | ORAL | 6 refills | Status: DC | PRN

## 2019-04-21 MED ORDER — TRAZODONE HCL 100 MG PO TABS: 100.0000 mg | ORAL_TABLET | Freq: Every evening | ORAL | 3 refills | Status: DC | PRN

## 2019-04-21 NOTE — Progress Notes (Signed)
[name]   Established Patient Office Visit  Subjective:  Patient ID: Misty Mccullough, female    DOB: 05-May-1971  Age: 48 y.o. MRN: [id number]  CC:  Chief Complaint  Patient presents with  . Follow-up    chronic condition     HPI Misty Mccullough is a 48 year old female who presents for follow up today.   Past Medical History:  Diagnosis Date  . Bilateral lower extremity edema   . Cough   . Diabetes mellitus without complication ([place])   . Gallstones   . Heavy cigarette smoker   . Hypercholesteremia   . Hypertension   . Shortness of breath     Current Status: Since her last office visit, she is doing well with no complaints. She denies fatigue, frequent urination, blurred vision, excessive hunger, excessive thirst, weight gain, weight loss, and poor wound healing. She continues to check her feet regularly. She denies visual changes, chest pain, cough, shortness of breath, heart palpitations, and falls. She has occasional headaches and dizziness with position changes. Denies severe headaches, confusion, seizures, double vision, and blurred vision, nausea and vomiting. She has c/o swelling in her feet.    She denies fevers, chills, fatigue, recent infections, weight loss, and night sweats. She has not had any headaches, visual changes, dizziness, and falls. No chest pain, heart palpitations, cough and shortness of breath reported. No reports of GI problems such as nausea, vomiting, diarrhea, and constipation. She has no reports of blood in stools, dysuria and hematuria. No depression or anxiety reported.  She denies pain today.   Past Surgical History:  Procedure Laterality Date  . TUBAL LIGATION      Family History  Problem Relation Age of Onset  . Diabetes Mother   . Uterine cancer Mother   . Diabetes Father   . Heart attack Father 61  . Stroke Father   . Heart attack Paternal Grandmother   . Heart attack Paternal Grandfather    . Stroke Sister   . Heart attack Paternal Uncle   . Bone cancer Paternal Uncle     Social History   Socioeconomic History  . Marital status: Married    Spouse name: Not on file  . Number of children: Not on file  . Years of education: Not on file  . Highest education level: Not on file  Occupational History  . Not on file  Social Needs  . Financial resource strain: Not on file  . Food insecurity    Worry: Not on file    Inability: Not on file  . Transportation needs    Medical: Not on file    Non-medical: Not on file  Tobacco Use  . Smoking status: Current Every Day Smoker    Packs/day: 1.50  . Smokeless tobacco: Never Used  . Tobacco comment: Since age 11.  Substance and Sexual Activity  . Alcohol use: Yes    Comment: Occasional  . Drug use: No  . Sexual activity: Not on file  Lifestyle  . Physical activity    Days per week: Not on file    Minutes per session: Not on file  . Stress: Not on file  Relationships  . Social Herbalist on phone: Not on file    Gets together: Not on file    Attends religious service: Not on file    Active member of club or organization: Not on file    Attends  meetings of clubs or organizations: Not on file    Relationship status: Not on file  . Intimate partner violence    Fear of current or ex partner: Not on file    Emotionally abused: Not on file    Physically abused: Not on file    Forced sexual activity: Not on file  Other Topics Concern  . Not on file  Social History Narrative  . Not on file    Outpatient Medications Prior to Visit  Medication Sig Dispense Refill  . albuterol (PROVENTIL HFA;VENTOLIN HFA) 108 (90 Base) MCG/ACT inhaler Inhale 2 puffs into the lungs every 6 (six) hours as needed for wheezing or shortness of breath. 1 Inhaler 6  . aspirin 325 MG EC tablet Take 1 tablet (325 mg total) by mouth daily. 30 tablet 0  . atorvastatin (LIPITOR) 10 MG tablet Take 1 tablet (10 mg total) by mouth daily. 90  tablet 1  . glipiZIDE (GLUCOTROL) 10 MG tablet Take 1 tablet (10 mg total) by mouth 2 (two) times daily before a meal. 60 tablet 3  . Insulin Syringes, Disposable, U-100 0.3 ML MISC 1 each by Does not apply route at bedtime. 100 each 11  . lisinopril (PRINIVIL,ZESTRIL) 40 MG tablet Take 1 tablet (40 mg total) by mouth daily. 30 tablet 3  . metFORMIN (GLUCOPHAGE) 1000 MG tablet Take 1 tablet (1,000 mg total) by mouth 2 (two) times daily with a meal. 120 tablet 2  . omeprazole (PRILOSEC) 20 MG capsule Take 1 capsule (20 mg total) by mouth daily. 30 capsule 3  . thiamine 100 MG tablet Take 1 tablet (100 mg total) by mouth daily. 30 tablet 0  . furosemide (LASIX) 20 MG tablet Take 1 tablet (20 mg total) by mouth 2 (two) times daily as needed. 30 tablet 3  . traZODone (DESYREL) 100 MG tablet Take 1 tablet (100 mg total) by mouth at bedtime as needed for sleep. 30 tablet 3  . insulin glargine (LANTUS) 100 UNIT/ML injection Inject 0.2 mLs (20 Units total) into the skin at bedtime. (Patient not taking: Reported on 11/04/2018) 10 mL 11  . Insulin Pen Needle (BD ULTRA-FINE PEN NEEDLES) 29G X 12.7MM MISC ICD10 E11.9 for use with insulin administration (Patient not taking: Reported on 11/04/2018) 200 each 2   No facility-administered medications prior to visit.     No Known Allergies  ROS Review of Systems  Constitutional: Negative.   HENT: Negative.   Eyes: Negative.   Respiratory: Negative.   Cardiovascular: Negative.   Gastrointestinal: Negative.   Endocrine: Negative.   Genitourinary: Negative.   Musculoskeletal: Negative.   Skin: Negative.   Allergic/Immunologic: Negative.   Neurological: Positive for dizziness (occasional ) and headaches (occassional ).  Hematological: Negative.   Psychiatric/Behavioral: Negative.       Objective:    Physical Exam  Constitutional: She is oriented to person, place, and time. She appears well-developed and well-nourished.  HENT:  Head: Normocephalic  and atraumatic.  Eyes: Conjunctivae are normal.  Neck: Normal range of motion. Neck supple.  Cardiovascular: Normal rate, regular rhythm, normal heart sounds and intact distal pulses.  Pulmonary/Chest: Effort normal and breath sounds normal.  Abdominal: Soft. Bowel sounds are normal.  Musculoskeletal: Normal range of motion.  Neurological: She is alert and oriented to person, place, and time. She has normal reflexes.  Skin: Skin is warm and dry.  Psychiatric: She has a normal mood and affect. Her behavior is normal. Judgment and thought content normal.  Nursing note  and vitals reviewed.   BP 140/76 (BP Location: Left Arm, Patient Position: Sitting, Cuff Size: Small)   Pulse 96   Temp 98.7 F (37.1 C) (Oral)   Ht 5\' 4"  (1.626 m)   Wt 165 lb 12.8 oz (75.2 kg)   SpO2 100%   BMI 28.46 kg/m  Wt Readings from Last 3 Encounters:  04/21/19 165 lb 12.8 oz (75.2 kg)  11/20/18 163 lb (73.9 kg)  11/04/18 163 lb 3.2 oz (74 kg)     Health Maintenance Due  Topic Date Due  . OPHTHALMOLOGY EXAM  11/28/1979  . FOOT EXAM  01/31/2019    There are no preventive care reminders to display for this patient.  Lab Results  Component Value Date   TSH 1.530 12/29/2017   Lab Results  Component Value Date   WBC 5.9 12/29/2017   HGB 12.3 12/29/2017   HCT 38.1 12/29/2017   MCV 88 12/29/2017   PLT 189 12/29/2017   Lab Results  Component Value Date   NA 140 12/29/2017   K 4.2 12/29/2017   CO2 22 12/29/2017   GLUCOSE 211 (H) 12/29/2017   BUN 10 12/29/2017   CREATININE 0.56 (L) 12/29/2017   BILITOT 0.3 12/29/2017   ALKPHOS 102 12/29/2017   AST 15 12/29/2017   ALT 20 12/29/2017   PROT 7.0 12/29/2017   ALBUMIN 4.1 12/29/2017   CALCIUM 9.4 12/29/2017   ANIONGAP 7 12/16/2017   Lab Results  Component Value Date   CHOL 181 12/16/2017   Lab Results  Component Value Date   HDL 31 (L) 12/16/2017   Lab Results  Component Value Date   LDLCALC 133 (H) 12/16/2017   Lab Results   Component Value Date   TRIG 86 12/16/2017   Lab Results  Component Value Date   CHOLHDL 5.8 12/16/2017   Lab Results  Component Value Date   HGBA1C 9.1 (A) 04/21/2019      Assessment & Plan:   1. Type 2 diabetes mellitus without complication, without long-term current use of insulin (HCC) Improved. Hgb A1c is decreased at 9.1 today, from 12.3 on 11/20/2018. She will continue to decrease foods/beverages high in sugars and carbs and follow Heart Healthy or DASH diet. Increase physical activity to at least 30 minutes cardio exercise daily.  - POCT urinalysis dipstick - POCT glycosylated hemoglobin (Hb A1C) - POCT glucose (manual entry) - CBC with Differential - Comprehensive metabolic panel - Lipid Panel - TSH - Vitamin D, 25-hydroxy - Vitamin B12  2. Bilateral lower extremity edema - furosemide (LASIX) 20 MG tablet; Take 1 tablet (20 mg total) by mouth 2 (two) times daily as needed.  Dispense: 60 tablet; Refill: 6  3. Insomnia, unspecified type - traZODone (DESYREL) 100 MG tablet; Take 1 tablet (100 mg total) by mouth at bedtime as needed for sleep.  Dispense: 30 tablet; Refill: 3  4. Essential hypertension The current medical regimen is effective; blood pressure is stable at 140/76 today; continue present plan and medications as prescribed. She will continue to decrease high sodium intake, excessive alcohol intake, increase potassium intake, smoking cessation, and increase physical activity of at least 30 minutes of cardio activity daily. She will continue to follow Heart Healthy or DASH diet.  5. Follow up She will follow up in 3 months.  Meds ordered this encounter  Medications  . furosemide (LASIX) 20 MG tablet    Sig: Take 1 tablet (20 mg total) by mouth 2 (two) times daily as needed.  Dispense:  60 tablet    Refill:  6  . traZODone (DESYREL) 100 MG tablet    Sig: Take 1 tablet (100 mg total) by mouth at bedtime as needed for sleep.    Dispense:  30 tablet     Refill:  3    Orders Placed This Encounter  Procedures  . CBC with Differential  . Comprehensive metabolic panel  . Lipid Panel  . TSH  . Vitamin D, 25-hydroxy  . Vitamin B12  . POCT urinalysis dipstick  . POCT glycosylated hemoglobin (Hb A1C)  . POCT glucose (manual entry)    Referral Orders  No referral(s) requested today      Raliegh IpNatalie Lucindy Borel,  MSN, FNP-BC [facility] [address]  [place], [place] [id number] [phone]  Problem List Items Addressed This Visit      Cardiovascular and Mediastinum   Essential hypertension   Relevant Medications   furosemide (LASIX) 20 MG tablet     Endocrine   Type 2 diabetes mellitus without complication, without long-term current use of insulin (HCC) - Primary   Relevant Orders   POCT urinalysis dipstick (Completed)   POCT glycosylated hemoglobin (Hb A1C) (Completed)   POCT glucose (manual entry) (Completed)   CBC with Differential   Comprehensive metabolic panel   Lipid Panel   TSH   Vitamin D, 25-hydroxy   Vitamin B12     Other   Bilateral lower extremity edema   Relevant Medications   furosemide (LASIX) 20 MG tablet   Insomnia   Relevant Medications   traZODone (DESYREL) 100 MG tablet    Other Visit Diagnoses    Follow up          Meds ordered this encounter  Medications  . furosemide (LASIX) 20 MG tablet    Sig: Take 1 tablet (20 mg total) by mouth 2 (two) times daily as needed.    Dispense:  60 tablet    Refill:  6  . traZODone (DESYREL) 100 MG tablet    Sig: Take 1 tablet (100 mg total) by mouth at bedtime as needed for sleep.    Dispense:  30 tablet    Refill:  3    Follow-up: Return in about 3 months (around 07/22/2019).    Kallie LocksNatalie M Melysa Schroyer, FNP

## 2019-04-22 LAB — LIPID PANEL
Chol/HDL Ratio: 3.1 ratio (ref 0.0–4.4)
Cholesterol, Total: 106 mg/dL (ref 100–199)
HDL: 34 mg/dL — ABNORMAL LOW (ref >39)
LDL Calculated: 60 mg/dL (ref 0–99)
Triglycerides: 59 mg/dL (ref 0–149)
VLDL Cholesterol Cal: 12 mg/dL (ref 5–40)

## 2019-04-22 LAB — CBC WITH DIFFERENTIAL/PLATELET
Basophils Absolute: 0 10*3/uL (ref 0.0–0.2)
Basos: 1 %
EOS (ABSOLUTE): 0.1 10*3/uL (ref 0.0–0.4)
Eos: 2 %
Hematocrit: 33.2 % — ABNORMAL LOW (ref 34.0–46.6)
Hemoglobin: 11.5 g/dL (ref 11.1–15.9)
Immature Grans (Abs): 0 10*3/uL (ref 0.0–0.1)
Immature Granulocytes: 0 %
Lymphocytes Absolute: 1.9 10*3/uL (ref 0.7–3.1)
Lymphs: 34 %
MCH: 29.6 pg (ref 26.6–33.0)
MCHC: 34.6 g/dL (ref 31.5–35.7)
MCV: 86 fL (ref 79–97)
Monocytes Absolute: 0.3 10*3/uL (ref 0.1–0.9)
Monocytes: 6 %
Neutrophils Absolute: 3.2 10*3/uL (ref 1.4–7.0)
Neutrophils: 57 %
Platelets: 223 10*3/uL (ref 150–450)
RBC: 3.88 x10E6/uL (ref 3.77–5.28)
RDW: 15.5 % — ABNORMAL HIGH (ref 11.7–15.4)
WBC: 5.6 10*3/uL (ref 3.4–10.8)

## 2019-04-22 LAB — COMPREHENSIVE METABOLIC PANEL
ALT: 31 IU/L (ref 0–32)
AST: 30 IU/L (ref 0–40)
Albumin/Globulin Ratio: 1.6 (ref 1.2–2.2)
Albumin: 4.1 g/dL (ref 3.8–4.8)
Alkaline Phosphatase: 72 IU/L (ref 39–117)
BUN/Creatinine Ratio: 12 (ref 9–23)
BUN: 7 mg/dL (ref 6–24)
Bilirubin Total: 0.2 mg/dL (ref 0.0–1.2)
CO2: 19 mmol/L — ABNORMAL LOW (ref 20–29)
Calcium: 9.5 mg/dL (ref 8.7–10.2)
Chloride: 108 mmol/L — ABNORMAL HIGH (ref 96–106)
Creatinine, Ser: 0.6 mg/dL (ref 0.57–1.00)
GFR calc Af Amer: 124 mL/min/{1.73_m2} (ref >59)
GFR calc non Af Amer: 107 mL/min/{1.73_m2} (ref >59)
Globulin, Total: 2.6 g/dL (ref 1.5–4.5)
Glucose: 151 mg/dL — ABNORMAL HIGH (ref 65–99)
Potassium: 4.5 mmol/L (ref 3.5–5.2)
Sodium: 144 mmol/L (ref 134–144)
Total Protein: 6.7 g/dL (ref 6.0–8.5)

## 2019-04-22 LAB — VITAMIN D 25 HYDROXY (VIT D DEFICIENCY, FRACTURES): Vit D, 25-Hydroxy: 20.5 ng/mL — ABNORMAL LOW (ref 30.0–100.0)

## 2019-04-22 LAB — TSH: TSH: 1.08 u[IU]/mL (ref 0.450–4.500)

## 2019-05-11 ENCOUNTER — Other Ambulatory Visit: Payer: Self-pay

## 2019-05-11 DIAGNOSIS — I10 Essential (primary) hypertension: Secondary | ICD-10-CM

## 2019-05-11 DIAGNOSIS — E119 Type 2 diabetes mellitus without complications: Secondary | ICD-10-CM

## 2019-05-11 MED ORDER — GLIPIZIDE 10 MG PO TABS: 10.0000 mg | ORAL_TABLET | Freq: Two times a day (BID) | ORAL | 3 refills | Status: DC

## 2019-05-11 MED ORDER — LISINOPRIL 40 MG PO TABS: 40.0000 mg | ORAL_TABLET | Freq: Every day | ORAL | 3 refills | Status: DC

## 2019-07-23 ENCOUNTER — Ambulatory Visit: Payer: Self-pay | Admitting: Family Medicine

## 2019-08-07 ENCOUNTER — Other Ambulatory Visit: Payer: Self-pay | Admitting: Family Medicine

## 2019-08-08 DIAGNOSIS — E119 Type 2 diabetes mellitus without complications: Secondary | ICD-10-CM

## 2019-08-08 DIAGNOSIS — R7303 Prediabetes: Secondary | ICD-10-CM

## 2019-08-08 HISTORY — DX: Type 2 diabetes mellitus without complications: E11.9

## 2019-08-08 HISTORY — DX: Prediabetes: R73.03

## 2019-08-09 ENCOUNTER — Other Ambulatory Visit: Payer: Self-pay

## 2019-08-09 DIAGNOSIS — I10 Essential (primary) hypertension: Secondary | ICD-10-CM

## 2019-08-09 MED ORDER — LISINOPRIL 40 MG PO TABS: 40.0000 mg | ORAL_TABLET | Freq: Every day | ORAL | 3 refills | Status: DC

## 2019-08-11 ENCOUNTER — Other Ambulatory Visit: Payer: Self-pay | Admitting: Family Medicine

## 2019-08-25 ENCOUNTER — Encounter: Payer: Self-pay | Admitting: Family Medicine

## 2019-08-25 ENCOUNTER — Other Ambulatory Visit: Payer: Self-pay

## 2019-08-25 ENCOUNTER — Ambulatory Visit (INDEPENDENT_AMBULATORY_CARE_PROVIDER_SITE_OTHER): Payer: Self-pay | Admitting: Family Medicine

## 2019-08-25 VITALS — BP 139/71 | HR 71 | Temp 98.6°F | Ht 64.0 in | Wt 167.8 lb

## 2019-08-25 DIAGNOSIS — R600 Localized edema: Secondary | ICD-10-CM

## 2019-08-25 DIAGNOSIS — K219 Gastro-esophageal reflux disease without esophagitis: Secondary | ICD-10-CM

## 2019-08-25 DIAGNOSIS — I10 Essential (primary) hypertension: Secondary | ICD-10-CM

## 2019-08-25 DIAGNOSIS — F419 Anxiety disorder, unspecified: Secondary | ICD-10-CM | POA: Insufficient documentation

## 2019-08-25 DIAGNOSIS — R739 Hyperglycemia, unspecified: Secondary | ICD-10-CM | POA: Insufficient documentation

## 2019-08-25 DIAGNOSIS — E119 Type 2 diabetes mellitus without complications: Secondary | ICD-10-CM | POA: Insufficient documentation

## 2019-08-25 DIAGNOSIS — Z09 Encounter for follow-up examination after completed treatment for conditions other than malignant neoplasm: Secondary | ICD-10-CM

## 2019-08-25 DIAGNOSIS — R0602 Shortness of breath: Secondary | ICD-10-CM | POA: Insufficient documentation

## 2019-08-25 DIAGNOSIS — R7303 Prediabetes: Secondary | ICD-10-CM

## 2019-08-25 DIAGNOSIS — Z23 Encounter for immunization: Secondary | ICD-10-CM

## 2019-08-25 DIAGNOSIS — Z720 Tobacco use: Secondary | ICD-10-CM

## 2019-08-25 DIAGNOSIS — G4700 Insomnia, unspecified: Secondary | ICD-10-CM

## 2019-08-25 DIAGNOSIS — Z716 Tobacco abuse counseling: Secondary | ICD-10-CM

## 2019-08-25 LAB — GLUCOSE, POCT (MANUAL RESULT ENTRY): POC Glucose: 243 mg/dl — AB (ref 70–99)

## 2019-08-25 LAB — POCT URINALYSIS DIPSTICK
Bilirubin, UA: NEGATIVE
Blood, UA: NEGATIVE
Glucose, UA: NEGATIVE
Ketones, UA: NEGATIVE
Leukocytes, UA: NEGATIVE
Nitrite, UA: NEGATIVE
Protein, UA: NEGATIVE
Spec Grav, UA: 1.01 (ref 1.010–1.025)
Urobilinogen, UA: 0.2 E.U./dL
pH, UA: 5.5 (ref 5.0–8.0)

## 2019-08-25 LAB — POCT GLYCOSYLATED HEMOGLOBIN (HGB A1C): Hemoglobin A1C: 7.7 % — AB (ref 4.0–5.6)

## 2019-08-25 MED ORDER — ASPIRIN 325 MG PO TBEC: 325.0000 mg | DELAYED_RELEASE_TABLET | Freq: Every day | ORAL | 3 refills | Status: DC

## 2019-08-25 MED ORDER — BUSPIRONE HCL 5 MG PO TABS: 5.0000 mg | ORAL_TABLET | Freq: Two times a day (BID) | ORAL | 3 refills | Status: DC

## 2019-08-25 MED ORDER — OMEPRAZOLE 20 MG PO CPDR: 20.0000 mg | DELAYED_RELEASE_CAPSULE | Freq: Every day | ORAL | 3 refills | Status: DC

## 2019-08-25 MED ORDER — FUROSEMIDE 20 MG PO TABS: 20.0000 mg | ORAL_TABLET | Freq: Two times a day (BID) | ORAL | 3 refills | Status: DC | PRN

## 2019-08-25 MED ORDER — ATORVASTATIN CALCIUM 10 MG PO TABS: 10.0000 mg | ORAL_TABLET | Freq: Every day | ORAL | 3 refills | Status: DC

## 2019-08-25 MED ORDER — GLIPIZIDE 10 MG PO TABS: 10.0000 mg | ORAL_TABLET | Freq: Two times a day (BID) | ORAL | 3 refills | Status: DC

## 2019-08-25 MED ORDER — LISINOPRIL 40 MG PO TABS: 40.0000 mg | ORAL_TABLET | Freq: Every day | ORAL | 3 refills | Status: DC

## 2019-08-25 MED ORDER — METFORMIN HCL 1000 MG PO TABS: 1000.0000 mg | ORAL_TABLET | Freq: Two times a day (BID) | ORAL | 3 refills | Status: DC

## 2019-08-25 NOTE — Progress Notes (Signed)
Patient [facility]   Established Patient Office Visit  Subjective:  Patient ID: Misty Mccullough, female    DOB: [birth year]  Age: 48 y.o. MRN: [id number]  CC:  Chief Complaint  Patient presents with  . Follow-up    DM    HPI Misty Mccullough is a 48 year old female who presents for Follow Up today.   Past Medical History:  Diagnosis Date  . Bilateral lower extremity edema   . Cough   . Diabetes mellitus without complication ([place])   . Gallstones   . Heavy cigarette smoker   . Hemoglobin A1C between 7% and 9% indicating borderline diabetic control 08/2019  . Hypercholesteremia   . Hyperglycemia   . Hypertension   . Shortness of breath    Current Status: Since her last office visit, she is doing well with no complaints. She is currently smoking 1 pack of cigarettes daily. She has had increased anxiety reported today. She denies suicidal ideations, homicidal ideations, or auditory hallucinations. She denies pain today. She has seen low range of 156 and high of 300 since his last office visit. She denies fatigue, frequent urination, blurred vision, excessive hunger, excessive thirst, weight gain, weight loss, and poor wound healing. She continues to check her  feet regularly. She denies visual changes, chest pain, cough, shortness of breath, heart palpitations, and falls. She has occasional headaches and dizziness with position changes. Denies severe headaches, confusion, seizures, double vision, and blurred vision, nausea and vomiting. She denies fevers, chills, recent infections, weight loss, and night sweats. No reports of GI problems such as diarrhea, and constipation. She has no reports of blood in stools, dysuria and hematuria.   Past Surgical History:  Procedure Laterality Date  . TUBAL LIGATION      Family History  Problem Relation Age of Onset  . Diabetes Mother   . Uterine cancer Mother   . Diabetes Father   . Heart attack Father 89   . Stroke Father   . Heart attack Paternal Grandmother   . Heart attack Paternal Grandfather   . Stroke Sister   . Heart attack Paternal Uncle   . Bone cancer Paternal Uncle     Social History   Socioeconomic History  . Marital status: Married    Spouse name: Not on file  . Number of children: Not on file  . Years of education: Not on file  . Highest education level: Not on file  Occupational History  . Not on file  Social Needs  . Financial resource strain: Not on file  . Food insecurity    Worry: Not on file    Inability: Not on file  . Transportation needs    Medical: Not on file    Non-medical: Not on file  Tobacco Use  . Smoking status: Current Every Day Smoker    Packs/day: 1.50  . Smokeless tobacco: Never Used  . Tobacco comment: Since age 37.  Substance and Sexual Activity  . Alcohol use: Not Currently    Comment: Occasional  . Drug use: No  . Sexual activity: Yes    Partners: Male  Lifestyle  . Physical activity    Days per week: Not on file    Minutes per session: Not on file  . Stress: Not on file  Relationships  . Social Herbalist on phone: Not on file    Gets together: Not on file    Attends religious service:  Not on file    Active member of club or organization: Not on file    Attends meetings of clubs or organizations: Not on file    Relationship status: Not on file  . Intimate partner violence    Fear of current or ex partner: Not on file    Emotionally abused: Not on file    Physically abused: Not on file    Forced sexual activity: Not on file  Other Topics Concern  . Not on file  Social History Narrative  . Not on file    Outpatient Medications Prior to Visit  Medication Sig Dispense Refill  . albuterol (PROVENTIL HFA;VENTOLIN HFA) 108 (90 Base) MCG/ACT inhaler Inhale 2 puffs into the lungs every 6 (six) hours as needed for wheezing or shortness of breath. 1 Inhaler 6  . thiamine 100 MG tablet Take 1 tablet (100 mg total) by  mouth daily. 30 tablet 0  . traZODone (DESYREL) 100 MG tablet Take 1 tablet (100 mg total) by mouth at bedtime as needed for sleep. 30 tablet 3  . aspirin 325 MG EC tablet Take 1 tablet (325 mg total) by mouth daily. 30 tablet 0  . atorvastatin (LIPITOR) 10 MG tablet Take 1 tablet by mouth once daily 90 tablet 0  . furosemide (LASIX) 20 MG tablet Take 1 tablet (20 mg total) by mouth 2 (two) times daily as needed. 60 tablet 6  . glipiZIDE (GLUCOTROL) 10 MG tablet Take 1 tablet (10 mg total) by mouth 2 (two) times daily before a meal. 60 tablet 3  . lisinopril (ZESTRIL) 40 MG tablet Take 1 tablet (40 mg total) by mouth daily. 30 tablet 3  . metFORMIN (GLUCOPHAGE) 1000 MG tablet Take 1 tablet (1,000 mg total) by mouth 2 (two) times daily with a meal. 120 tablet 2  . omeprazole (PRILOSEC) 20 MG capsule Take 1 capsule (20 mg total) by mouth daily. 30 capsule 3  . insulin glargine (LANTUS) 100 UNIT/ML injection Inject 0.2 mLs (20 Units total) into the skin at bedtime. (Patient not taking: Reported on 11/04/2018) 10 mL 11  . Insulin Pen Needle (BD ULTRA-FINE PEN NEEDLES) 29G X 12.7MM MISC ICD10 E11.9 for use with insulin administration (Patient not taking: Reported on 11/04/2018) 200 each 2  . Insulin Syringes, Disposable, U-100 0.3 ML MISC 1 each by Does not apply route at bedtime. (Patient not taking: Reported on 08/25/2019) 100 each 11   No facility-administered medications prior to visit.     No Known Allergies  ROS Review of Systems  Constitutional: Negative.   HENT: Negative.   Eyes: Negative.   Respiratory: Negative.   Cardiovascular: Negative.   Gastrointestinal: Negative.   Endocrine: Negative.   Genitourinary: Negative.   Musculoskeletal: Positive for arthralgias (generalized joint pain).  Skin: Negative.   Allergic/Immunologic: Negative.   Neurological: Positive for dizziness (occasional ) and headaches (Occasional ).  Hematological: Negative.   Psychiatric/Behavioral: Negative.         Increased anxiety.       Objective:    Physical Exam  Constitutional: She is oriented to person, place, and time. She appears well-developed and well-nourished.  HENT:  Head: Normocephalic and atraumatic.  Eyes: Conjunctivae are normal.  Neck: Normal range of motion. Neck supple.  Cardiovascular: Normal rate, regular rhythm, normal heart sounds and intact distal pulses.  Pulmonary/Chest: Effort normal and breath sounds normal.  Abdominal: Soft. Bowel sounds are normal.  Musculoskeletal: Normal range of motion.  Neurological: She is alert and oriented to  person, place, and time. She has normal reflexes.  Skin: Skin is warm.  Psychiatric: She has a normal mood and affect. Her behavior is normal. Judgment and thought content normal.  Mild anxiety today.   Nursing note and vitals reviewed.   BP 139/71   Pulse 71   Temp 98.6 F (37 C) (Oral)   Ht  (1.626 m)   Wt 167 lb 12.8 oz (76.1 kg)   SpO2 98%   BMI 28.80 kg/m  Wt Readings from Last 3 Encounters:  08/25/19 167 lb 12.8 oz (76.1 kg)  04/21/19 165 lb 12.8 oz (75.2 kg)  11/20/18 163 lb (73.9 kg)     Health Maintenance Due  Topic Date Due  . OPHTHALMOLOGY EXAM  11/28/1979  . FOOT EXAM  01/31/2019  . PAP SMEAR-Modifier  10/15/2019    There are no preventive care reminders to display for this patient.  Lab Results  Component Value Date   TSH 1.080 04/21/2019   Lab Results  Component Value Date   WBC 5.6 04/21/2019   HGB 11.5 04/21/2019   HCT 33.2 (L) 04/21/2019   MCV 86 04/21/2019   PLT 223 04/21/2019   Lab Results  Component Value Date   NA 144 04/21/2019   K 4.5 04/21/2019   CO2 19 (L) 04/21/2019   GLUCOSE 151 (H) 04/21/2019   BUN 7 04/21/2019   CREATININE 0.60 04/21/2019   BILITOT 0.2 04/21/2019   ALKPHOS 72 04/21/2019   AST 30 04/21/2019   ALT 31 04/21/2019   PROT 6.7 04/21/2019   ALBUMIN 4.1 04/21/2019   CALCIUM 9.5 04/21/2019   ANIONGAP 7 12/16/2017   Lab Results  Component Value  Date   CHOL 106 04/21/2019   Lab Results  Component Value Date   HDL 34 (L) 04/21/2019   Lab Results  Component Value Date   LDLCALC 60 04/21/2019   Lab Results  Component Value Date   TRIG 59 04/21/2019   Lab Results  Component Value Date   CHOLHDL 3.1 04/21/2019   Lab Results  Component Value Date   HGBA1C 7.7 (A) 08/25/2019      Assessment & Plan:   1. Type 2 diabetes mellitus without complication, without long-term current use of insulin (HCC) She will continue medication as prescribed, to decrease foods/beverages high in sugars and carbs and follow Heart Healthy or DASH diet. Increase physical activity to at least 30 minutes cardio exercise daily.  - Urinalysis Dipstick - POCT HgB A1C - Glucose (CBG)  2. Hemoglobin A1C between 7% and 9% indicating borderline diabetic control Hgb A1c at 7.7 today, from 12.3 on 11/20/2018.   3. Hyperglycemia  4. Essential hypertension The current medical regimen is effective; blood pressure is stable at 139/71 today; continue present plan and medications as prescribed. She will continue to take medications as prescribed, to decrease high sodium intake, excessive alcohol intake, increase potassium intake, smoking cessation, and increase physical activity of at least 30 minutes of cardio activity daily. She will continue to follow Heart Healthy or DASH diet.  5. Bilateral lower extremity edema  6. Insomnia, unspecified type  7. Anxiety Stable today.   8. Shortness of breath Stable today. No signs or symptoms of respiratory distress noted or reported.   9. Tobacco use  10. Encounter for smoking cessation counseling  11. Follow up She will follow up in 3 months.   Meds ordered this encounter  Medications  . DISCONTD: busPIRone (BUSPAR) 5 MG tablet    Sig: Take  1 tablet (5 mg total) by mouth 2 (two) times daily.    Dispense:  60 tablet    Refill:  3    Orders Placed This Encounter  Procedures  . Flu Vaccine QUAD 6+ mos  PF IM (Fluarix Quad PF)  . Pneumococcal conjugate vaccine 13-valent  . Urinalysis Dipstick  . POCT HgB A1C  . Glucose (CBG)    Referral Orders  No referral(s) requested today    Raliegh IpNatalie Cashius Grandstaff,  MSN, FNP-BC [facility] [address]  [place], [place] [address] [phone]  Problem List Items Addressed This Visit      Cardiovascular and Mediastinum   Essential hypertension     Endocrine   Type 2 diabetes mellitus without complication, without long-term current use of insulin (HCC) - Primary   Relevant Orders   Urinalysis Dipstick (Completed)   POCT HgB A1C (Completed)   Glucose (CBG) (Completed)     Other   Bilateral lower extremity edema   Insomnia   Tobacco use    Other Visit Diagnoses    Hemoglobin A1C between 7% and 9% indicating borderline diabetic control       Hyperglycemia       Anxiety       Shortness of breath       Encounter for smoking cessation counseling       Follow up          Meds ordered this encounter  Medications  . DISCONTD: busPIRone (BUSPAR) 5 MG tablet    Sig: Take 1 tablet (5 mg total) by mouth 2 (two) times daily.    Dispense:  60 tablet    Refill:  3    Follow-up: Return in about 3 months (around 11/25/2019).    Kallie LocksNatalie M Breylen Agyeman, FNP

## 2019-08-25 NOTE — Patient Instructions (Signed)

## 2019-11-24 ENCOUNTER — Encounter: Payer: Self-pay | Admitting: Family Medicine

## 2019-11-24 ENCOUNTER — Ambulatory Visit (INDEPENDENT_AMBULATORY_CARE_PROVIDER_SITE_OTHER): Payer: Self-pay | Admitting: Family Medicine

## 2019-11-24 ENCOUNTER — Other Ambulatory Visit: Payer: Self-pay

## 2019-11-24 VITALS — BP 101/62 | HR 86 | Temp 98.0°F | Ht 64.0 in | Wt 162.2 lb

## 2019-11-24 DIAGNOSIS — E119 Type 2 diabetes mellitus without complications: Secondary | ICD-10-CM

## 2019-11-24 DIAGNOSIS — R7309 Other abnormal glucose: Secondary | ICD-10-CM

## 2019-11-24 DIAGNOSIS — I10 Essential (primary) hypertension: Secondary | ICD-10-CM

## 2019-11-24 DIAGNOSIS — L089 Local infection of the skin and subcutaneous tissue, unspecified: Secondary | ICD-10-CM

## 2019-11-24 DIAGNOSIS — F419 Anxiety disorder, unspecified: Secondary | ICD-10-CM

## 2019-11-24 DIAGNOSIS — R739 Hyperglycemia, unspecified: Secondary | ICD-10-CM

## 2019-11-24 DIAGNOSIS — K219 Gastro-esophageal reflux disease without esophagitis: Secondary | ICD-10-CM

## 2019-11-24 DIAGNOSIS — R600 Localized edema: Secondary | ICD-10-CM

## 2019-11-24 DIAGNOSIS — Z09 Encounter for follow-up examination after completed treatment for conditions other than malignant neoplasm: Secondary | ICD-10-CM

## 2019-11-24 LAB — POCT URINALYSIS DIPSTICK
Bilirubin, UA: NEGATIVE
Blood, UA: NEGATIVE
Glucose, UA: NEGATIVE
Ketones, UA: NEGATIVE
Leukocytes, UA: NEGATIVE
Nitrite, UA: NEGATIVE
Protein, UA: NEGATIVE
Spec Grav, UA: 1.025 (ref 1.010–1.025)
Urobilinogen, UA: 1 E.U./dL
pH, UA: 5 (ref 5.0–8.0)

## 2019-11-24 LAB — POCT GLYCOSYLATED HEMOGLOBIN (HGB A1C): Hemoglobin A1C: 11 % — AB (ref 4.0–5.6)

## 2019-11-24 LAB — GLUCOSE, POCT (MANUAL RESULT ENTRY): POC Glucose: 195 mg/dl — AB (ref 70–99)

## 2019-11-24 MED ORDER — OMEPRAZOLE 40 MG PO CPDR: 40.0000 mg | DELAYED_RELEASE_CAPSULE | Freq: Every day | ORAL | 3 refills | Status: DC

## 2019-11-24 MED ORDER — DOXYCYCLINE HYCLATE 100 MG PO TABS: 100.0000 mg | ORAL_TABLET | Freq: Two times a day (BID) | ORAL | 0 refills | Status: AC

## 2019-11-24 NOTE — Progress Notes (Signed)
[facility]    Established Patient Office Visit  Subjective:  Patient ID: Misty Mccullough, female    DOB: 1970/11/20  Age: 49 y.o. MRN: [id number]  CC:  Chief Complaint  Patient presents with  . Follow-up    DM  . Abrasion    Right hand    HPI Misty Mccullough is a 49 year old female who presents for Follow Up today.   Past Medical History:  Diagnosis Date  . Bilateral lower extremity edema   . Cough   . Diabetes mellitus without complication (HCC)   . Gallstones   . Heavy cigarette smoker   . Hemoglobin A1C between 7% and 9% indicating borderline diabetic control 08/2019  . Hypercholesteremia   . Hyperglycemia   . Hypertension   . Shortness of breath    Current Status: Since her last office visit, she has c/o left hand lesion X 1 week. She describes site as sore and red. She has used Neosporin with minimal relief of symptoms. She denies fatigue, frequent urination, blurred vision, excessive hunger, excessive thirst, weight gain, weight loss, and poor wound healing. She continues to check her feet regularly. She denies visual changes, chest pain, cough, shortness of breath, heart palpitations, and falls. she has occasional headaches and dizziness with position changes. Denies severe headaches, confusion, seizures, double vision, and blurred vision. She denies fevers, chills, recent infections, weight loss, and night sweats. No reports of GI problems such as nausea, vomiting, diarrhea, and constipation. She has no reports of blood in stools, dysuria and hematuria. No depression or anxiety reported today. She denies suicidal ideations, homicidal ideations, or auditory hallucinations. She denies pain today.   Past Surgical History:  Procedure Laterality Date  . TUBAL LIGATION      Family History  Problem Relation Age of Onset  . Diabetes Mother   . Uterine cancer Mother   . Diabetes Father   . Heart attack Father 26  . Stroke Father    . Heart attack Paternal Grandmother   . Heart attack Paternal Grandfather   . Stroke Sister   . Heart attack Paternal Uncle   . Bone cancer Paternal Uncle     Social History   Socioeconomic History  . Marital status: Married    Spouse name: Not on file  . Number of children: Not on file  . Years of education: Not on file  . Highest education level: Not on file  Occupational History  . Not on file  Tobacco Use  . Smoking status: Current Every Day Smoker    Packs/day: 1.50  . Smokeless tobacco: Never Used  . Tobacco comment: Since age 23.  Substance and Sexual Activity  . Alcohol use: Not Currently    Comment: Occasional  . Drug use: No  . Sexual activity: Yes    Partners: Male  Other Topics Concern  . Not on file  Social History Narrative  . Not on file   Social Determinants of Health   Financial Resource Strain:   . Difficulty of Paying Living Expenses: Not on file  Food Insecurity:   . Worried About Programme researcher, broadcasting/film/video in the Last Year: Not on file  . Ran Out of Food in the Last Year: Not on file  Transportation Needs:   . Lack of Transportation (Medical): Not on file  . Lack of Transportation (Non-Medical): Not on file  Physical Activity:   . Days of Exercise per Week: Not on file  .  Minutes of Exercise per Session: Not on file  Stress:   . Feeling of Stress : Not on file  Social Connections:   . Frequency of Communication with Friends and Family: Not on file  . Frequency of Social Gatherings with Friends and Family: Not on file  . Attends Religious Services: Not on file  . Active Member of Clubs or Organizations: Not on file  . Attends Banker Meetings: Not on file  . Marital Status: Not on file  Intimate Partner Violence:   . Fear of Current or Ex-Partner: Not on file  . Emotionally Abused: Not on file  . Physically Abused: Not on file  . Sexually Abused: Not on file    Outpatient Medications Prior to Visit  Medication Sig Dispense  Refill  . albuterol (PROVENTIL HFA;VENTOLIN HFA) 108 (90 Base) MCG/ACT inhaler Inhale 2 puffs into the lungs every 6 (six) hours as needed for wheezing or shortness of breath. 1 Inhaler 6  . aspirin 325 MG EC tablet Take 1 tablet (325 mg total) by mouth daily. 30 tablet 3  . atorvastatin (LIPITOR) 10 MG tablet Take 1 tablet (10 mg total) by mouth daily. 30 tablet 3  . busPIRone (BUSPAR) 5 MG tablet Take 1 tablet (5 mg total) by mouth 2 (two) times daily. 60 tablet 3  . furosemide (LASIX) 20 MG tablet Take 1 tablet (20 mg total) by mouth 2 (two) times daily as needed. 60 tablet 3  . glipiZIDE (GLUCOTROL) 10 MG tablet Take 1 tablet (10 mg total) by mouth 2 (two) times daily before a meal. 60 tablet 3  . lisinopril (ZESTRIL) 40 MG tablet Take 1 tablet (40 mg total) by mouth daily. 30 tablet 3  . metFORMIN (GLUCOPHAGE) 1000 MG tablet Take 1 tablet (1,000 mg total) by mouth 2 (two) times daily with a meal. 60 tablet 3  . thiamine 100 MG tablet Take 1 tablet (100 mg total) by mouth daily. 30 tablet 0  . traZODone (DESYREL) 100 MG tablet Take 1 tablet (100 mg total) by mouth at bedtime as needed for sleep. 30 tablet 3  . insulin glargine (LANTUS) 100 UNIT/ML injection Inject 0.2 mLs (20 Units total) into the skin at bedtime. (Patient not taking: Reported on 11/04/2018) 10 mL 11  . Insulin Pen Needle (BD ULTRA-FINE PEN NEEDLES) 29G X 12.7MM MISC ICD10 E11.9 for use with insulin administration (Patient not taking: Reported on 11/24/2019) 200 each 2  . Insulin Syringes, Disposable, U-100 0.3 ML MISC 1 each by Does not apply route at bedtime. (Patient not taking: Reported on 08/25/2019) 100 each 11  . omeprazole (PRILOSEC) 20 MG capsule Take 1 capsule (20 mg total) by mouth daily. 30 capsule 3   No facility-administered medications prior to visit.    Patient Active Problem List   Diagnosis Date Noted  . Shortness of breath 08/25/2019  . Anxiety 08/25/2019  . Hyperglycemia 08/25/2019  . Hemoglobin A1C  between 7% and 9% indicating borderline diabetic control 08/25/2019  . Type 2 diabetes mellitus without complication, without long-term current use of insulin (HCC) 11/24/2018  . Bilateral lower extremity edema 11/24/2018  . Pain and swelling of toe, left 11/04/2018  . Insomnia 11/04/2018  . Tobacco dependence 02/05/2018  . History of CVA (cerebrovascular accident) 02/05/2018  . Tobacco use 12/16/2017  . Acute CVA (cerebrovascular accident) (HCC) 12/15/2017  . Malnutrition of moderate degree 08/14/2016  . Chest pain 08/13/2016  . Back pain 08/13/2016  . Essential hypertension 08/13/2016  . HLD (  hyperlipidemia) 08/13/2016  . Diabetes mellitus with complication (HCC) 08/13/2016  . Chest pain, exertional    No Known Allergies  ROS Review of Systems  Constitutional: Negative.   HENT: Negative.   Eyes: Negative.   Respiratory: Negative.   Cardiovascular: Negative.   Gastrointestinal: Negative.   Endocrine: Negative.   Genitourinary: Negative.   Musculoskeletal: Negative.   Skin: Negative.   Allergic/Immunologic: Negative.   Neurological: Positive for dizziness (occasional) and headaches (occasional).  Hematological: Negative.   Psychiatric/Behavioral: Negative.     Objective:    Physical Exam  Constitutional: She is oriented to person, place, and time. She appears well-developed and well-nourished.  HENT:  Head: Normocephalic and atraumatic.  Eyes: Conjunctivae are normal.  Cardiovascular: Normal rate, regular rhythm, normal heart sounds and intact distal pulses.  Pulmonary/Chest: Effort normal and breath sounds normal.  Abdominal: Soft. Bowel sounds are normal.  Musculoskeletal:        General: Normal range of motion.     Cervical back: Normal range of motion and neck supple.  Neurological: She is alert and oriented to person, place, and time. She has normal reflexes.  Skin: Skin is warm and dry.  Psychiatric: She has a normal mood and affect. Her behavior is normal.  Judgment and thought content normal.  Nursing note and vitals reviewed.   BP 101/62   Pulse 86   Temp 98 F (36.7 C) (Oral)   Ht 5\' 4"  (1.626 m)   Wt 162 lb 3.2 oz (73.6 kg)   SpO2 97%   BMI 27.84 kg/m  Wt Readings from Last 3 Encounters:  11/24/19 162 lb 3.2 oz (73.6 kg)  08/25/19 167 lb 12.8 oz (76.1 kg)  04/21/19 165 lb 12.8 oz (75.2 kg)     Health Maintenance Due  Topic Date Due  . OPHTHALMOLOGY EXAM  11/28/1979  . FOOT EXAM  01/31/2019  . MAMMOGRAM  11/28/2019  . COLONOSCOPY  11/28/2019  . PAP SMEAR-Modifier  10/15/2019    There are no preventive care reminders to display for this patient.  Lab Results  Component Value Date   TSH 1.080 04/21/2019   Lab Results  Component Value Date   WBC 5.6 04/21/2019   HGB 11.5 04/21/2019   HCT 33.2 (L) 04/21/2019   MCV 86 04/21/2019   PLT 223 04/21/2019   Lab Results  Component Value Date   NA 144 04/21/2019   K 4.5 04/21/2019   CO2 19 (L) 04/21/2019   GLUCOSE 151 (H) 04/21/2019   BUN 7 04/21/2019   CREATININE 0.60 04/21/2019   BILITOT 0.2 04/21/2019   ALKPHOS 72 04/21/2019   AST 30 04/21/2019   ALT 31 04/21/2019   PROT 6.7 04/21/2019   ALBUMIN 4.1 04/21/2019   CALCIUM 9.5 04/21/2019   ANIONGAP 7 12/16/2017   Lab Results  Component Value Date   CHOL 106 04/21/2019   Lab Results  Component Value Date   HDL 34 (L) 04/21/2019   Lab Results  Component Value Date   LDLCALC 60 04/21/2019   Lab Results  Component Value Date   TRIG 59 04/21/2019   Lab Results  Component Value Date   CHOLHDL 3.1 04/21/2019   Lab Results  Component Value Date   HGBA1C 11.0 (A) 11/24/2019      Assessment & Plan:   1. Type 2 diabetes mellitus without complication, without long-term current use of insulin (HCC) She will continue medication as prescribed, to decrease foods/beverages high in sugars and carbs and follow Heart Healthy or  DASH diet. Increase physical activity to at least 30 minutes cardio exercise daily.    - POCT glycosylated hemoglobin (Hb A1C) - POCT urinalysis dipstick - POCT glucose (manual entry)  2. Hemoglobin A1C greater than 9%, indicating poor diabetic control Worsened. Hgb A1c is increased at 11.0 today, from 7.7 on 08/25/2019. Continue to monitor.   3. Hyperglycemia  4. Encounter for diabetic foot exam ([place]) Negative. Foot exam tolerated well. No areas of decreased sensitivity noted upon foot exam. Patient counseled on proper foot hygiene. She is encouraged to exam feet often (daily), using mirror if necessary; keep feet clean and dry (especially between toes), keep feet moistened, wear cotton socks, and avoid wearing open-toed shoes, high-heel shoes, and sandals. Patient verbalized understanding.   5. Skin infection We will initiate antibiotic today.  - doxycycline (VIBRA-TABS) 100 MG tablet; Take 1 tablet (100 mg total) by mouth 2 (two) times daily for 10 days.  Dispense: 20 tablet; Refill: 0  6. Essential hypertension The current medical regimen is effective; blood pressure is stable at 101/62 today; continue present plan and medications as prescribed. She will continue to take medications as prescribed, to decrease high sodium intake, excessive alcohol intake, increase potassium intake, smoking cessation, and increase physical activity of at least 30 minutes of cardio activity daily. She will continue to follow Heart Healthy or DASH diet.  7. Anxiety  8. Gastroesophageal reflux disease without esophagitis We will increased Omeprazole today.  - omeprazole (PRILOSEC) 40 MG capsule; Take 1 capsule (40 mg total) by mouth daily.  Dispense: 30 capsule; Refill: 3  9. Bilateral lower extremity edema Stable.   10. Follow up She will follow up in 3 months.   Meds ordered this encounter  Medications  . doxycycline (VIBRA-TABS) 100 MG tablet    Sig: Take 1 tablet (100 mg total) by mouth 2 (two) times daily for 10 days.    Dispense:  20 tablet    Refill:  0  . omeprazole  (PRILOSEC) 40 MG capsule    Sig: Take 1 capsule (40 mg total) by mouth daily.    Dispense:  30 capsule    Refill:  3    Orders Placed This Encounter  Procedures  . POCT glycosylated hemoglobin (Hb A1C)  . POCT urinalysis dipstick  . POCT glucose (manual entry)    Referral Orders  No referral(s) requested today    Kathe Becton,  MSN, FNP-BC [address] [place], [place] [address] [phone]- fax  Problem List Items Addressed This Visit      Cardiovascular and Mediastinum   Essential hypertension     Endocrine   Type 2 diabetes mellitus without complication, without long-term current use of insulin ([place]) - Primary   Relevant Orders   POCT glycosylated hemoglobin (Hb A1C) (Completed)   POCT urinalysis dipstick (Completed)   POCT glucose (manual entry) (Completed)     Other   Anxiety   Bilateral lower extremity edema   Hyperglycemia    Other Visit Diagnoses    Hemoglobin A1C greater than 9%, indicating poor diabetic control       Encounter for diabetic foot exam ([place])       Skin infection       Relevant Medications   doxycycline (VIBRA-TABS) 100 MG tablet   Gastroesophageal reflux disease without esophagitis       Relevant Medications   omeprazole (PRILOSEC) 40 MG capsule   Follow up  Meds ordered this encounter  Medications  . doxycycline (VIBRA-TABS) 100 MG tablet    Sig: Take 1 tablet (100 mg total) by mouth 2 (two) times daily for 10 days.    Dispense:  20 tablet    Refill:  0  . omeprazole (PRILOSEC) 40 MG capsule    Sig: Take 1 capsule (40 mg total) by mouth daily.    Dispense:  30 capsule    Refill:  3    Follow-up: Return in about 3 months (around 02/21/2020).    Kallie Locks, FNP

## 2019-12-23 ENCOUNTER — Telehealth: Payer: Self-pay

## 2019-12-23 ENCOUNTER — Other Ambulatory Visit: Payer: Self-pay | Admitting: Family Medicine

## 2019-12-23 DIAGNOSIS — F419 Anxiety disorder, unspecified: Secondary | ICD-10-CM

## 2019-12-23 DIAGNOSIS — E119 Type 2 diabetes mellitus without complications: Secondary | ICD-10-CM

## 2019-12-28 NOTE — Telephone Encounter (Signed)
done

## 2020-01-11 ENCOUNTER — Telehealth: Payer: Self-pay | Admitting: Family Medicine

## 2020-01-12 NOTE — Telephone Encounter (Signed)
Should Misty Mccullough get the COVID vaccine? Please advise.

## 2020-01-13 ENCOUNTER — Other Ambulatory Visit: Payer: Self-pay | Admitting: Family Medicine

## 2020-01-13 DIAGNOSIS — E119 Type 2 diabetes mellitus without complications: Secondary | ICD-10-CM

## 2020-01-21 ENCOUNTER — Other Ambulatory Visit: Payer: Self-pay | Admitting: Family Medicine

## 2020-01-21 DIAGNOSIS — E119 Type 2 diabetes mellitus without complications: Secondary | ICD-10-CM

## 2020-01-24 ENCOUNTER — Other Ambulatory Visit: Payer: Self-pay

## 2020-01-24 MED ORDER — ATORVASTATIN CALCIUM 10 MG PO TABS: 10.0000 mg | ORAL_TABLET | Freq: Every day | ORAL | 0 refills | Status: DC

## 2020-01-25 ENCOUNTER — Other Ambulatory Visit: Payer: Self-pay

## 2020-01-25 MED ORDER — ATORVASTATIN CALCIUM 10 MG PO TABS: 10.0000 mg | ORAL_TABLET | Freq: Every day | ORAL | 0 refills | Status: DC

## 2020-02-23 ENCOUNTER — Ambulatory Visit: Payer: Self-pay | Admitting: Family Medicine

## 2020-02-23 ENCOUNTER — Other Ambulatory Visit: Payer: Self-pay | Admitting: Family Medicine

## 2020-02-23 DIAGNOSIS — E119 Type 2 diabetes mellitus without complications: Secondary | ICD-10-CM

## 2020-03-27 ENCOUNTER — Telehealth: Payer: Self-pay | Admitting: Family Medicine

## 2020-03-27 ENCOUNTER — Other Ambulatory Visit: Payer: Self-pay | Admitting: Family Medicine

## 2020-03-27 DIAGNOSIS — E119 Type 2 diabetes mellitus without complications: Secondary | ICD-10-CM

## 2020-03-27 DIAGNOSIS — R600 Localized edema: Secondary | ICD-10-CM

## 2020-03-27 DIAGNOSIS — I10 Essential (primary) hypertension: Secondary | ICD-10-CM

## 2020-03-27 DIAGNOSIS — K219 Gastro-esophageal reflux disease without esophagitis: Secondary | ICD-10-CM

## 2020-03-27 NOTE — Telephone Encounter (Signed)
Informed pt to contact the pharmacy regarding her refills, she said that her bottle said that she didn't have anymore refills.

## 2020-03-27 NOTE — Telephone Encounter (Signed)
Pt stated that her lasix is only a 15 day supply , and not a 30 day supply updated sent to the pharmacy

## 2020-04-07 ENCOUNTER — Other Ambulatory Visit: Payer: Self-pay

## 2020-04-07 ENCOUNTER — Ambulatory Visit (INDEPENDENT_AMBULATORY_CARE_PROVIDER_SITE_OTHER): Payer: Self-pay | Admitting: Family Medicine

## 2020-04-07 ENCOUNTER — Encounter: Payer: Self-pay | Admitting: Family Medicine

## 2020-04-07 VITALS — BP 164/67 | HR 79 | Temp 97.2°F | Ht 64.0 in | Wt 157.8 lb

## 2020-04-07 DIAGNOSIS — R0602 Shortness of breath: Secondary | ICD-10-CM

## 2020-04-07 DIAGNOSIS — Z09 Encounter for follow-up examination after completed treatment for conditions other than malignant neoplasm: Secondary | ICD-10-CM

## 2020-04-07 DIAGNOSIS — I10 Essential (primary) hypertension: Secondary | ICD-10-CM

## 2020-04-07 DIAGNOSIS — E119 Type 2 diabetes mellitus without complications: Secondary | ICD-10-CM

## 2020-04-07 DIAGNOSIS — R7309 Other abnormal glucose: Secondary | ICD-10-CM

## 2020-04-07 DIAGNOSIS — F419 Anxiety disorder, unspecified: Secondary | ICD-10-CM

## 2020-04-07 DIAGNOSIS — Z720 Tobacco use: Secondary | ICD-10-CM

## 2020-04-07 DIAGNOSIS — Z716 Tobacco abuse counseling: Secondary | ICD-10-CM

## 2020-04-07 LAB — POCT GLYCOSYLATED HEMOGLOBIN (HGB A1C)
HbA1c POC (<> result, manual entry): 9.6 % (ref 4.0–5.6)
HbA1c, POC (controlled diabetic range): 9.6 % — AB (ref 0.0–7.0)
HbA1c, POC (prediabetic range): 9.6 % — AB (ref 5.7–6.4)
Hemoglobin A1C: 9.6 % — AB (ref 4.0–5.6)

## 2020-04-07 LAB — POCT URINALYSIS DIPSTICK
Bilirubin, UA: NEGATIVE
Blood, UA: NEGATIVE
Glucose, UA: NEGATIVE
Ketones, UA: NEGATIVE
Leukocytes, UA: NEGATIVE
Nitrite, UA: NEGATIVE
Protein, UA: NEGATIVE
Spec Grav, UA: 1.01 (ref 1.010–1.025)
Urobilinogen, UA: 1 E.U./dL
pH, UA: 5.5 (ref 5.0–8.0)

## 2020-04-07 NOTE — Progress Notes (Signed)
[facility]   Established Patient Office Visit  Subjective:  Patient ID: Misty Mccullough, female    DOB: [birth year]  Age: 49 y.o. MRN: [id number]  CC:  Chief Complaint  Patient presents with  . Follow-up    follow up 3 month dm     HPI Misty Mccullough is a 49 year old female who presents for Follow Up today.    Patient Active Problem List   Diagnosis Date Noted  . Shortness of breath 08/25/2019  . Anxiety 08/25/2019  . Hyperglycemia 08/25/2019  . Hemoglobin A1C between 7% and 9% indicating borderline diabetic control 08/25/2019  . Type 2 diabetes mellitus without complication, without long-term current use of insulin (HCC) 11/24/2018  . Bilateral lower extremity edema 11/24/2018  . Pain and swelling of toe, left 11/04/2018  . Insomnia 11/04/2018  . Tobacco dependence 02/05/2018  . History of CVA (cerebrovascular accident) 02/05/2018  . Tobacco use 12/16/2017  . Acute CVA (cerebrovascular accident) (HCC) 12/15/2017  . Malnutrition of moderate degree 08/14/2016  . Chest pain 08/13/2016  . Back pain 08/13/2016  . Essential hypertension 08/13/2016  . HLD (hyperlipidemia) 08/13/2016  . Diabetes mellitus with complication (HCC) 08/13/2016  . Chest pain, exertional     Past Medical History:  Diagnosis Date  . Bilateral lower extremity edema   . Cough   . Diabetes mellitus without complication (HCC)   . Gallstones   . Heavy cigarette smoker   . Hemoglobin A1C between 7% and 9% indicating borderline diabetic control 08/2019  . Hypercholesteremia   . Hyperglycemia   . Hypertension   . Shortness of breath    Current Status: Since her last office visit, she is doing well with no complaint. She has not been monitoring her blood glucose levels regularly lately. She denies fatigue, frequent urination, excessive hunger, excessive thirst, weight gain, weight loss, and poor wound healing. She continues to check her feet regularly. Blood  pressure readings are elevated today. She states that she is taking her hypertensive medications daily as prescribed. She denies chest pain, cough, shortness of breath, heart palpitations, and falls. She has occasional headaches and dizziness with position changes. Denies severe headaches, confusion, seizures, double vision, and blurred vision, nausea and vomiting. She denies fevers, chills, fatigue, recent infections, weight loss, and night sweats. Denies GI problems such as nausea, vomiting, diarrhea, and constipation. She has no reports of blood in stools, dysuria and hematuria. No depression or anxiety, and denies suicidal ideations, homicidal ideations, or auditory hallucinations. She is taking all medications as prescribed. She denies pain today.   Past Surgical History:  Procedure Laterality Date  . TUBAL LIGATION      Family History  Problem Relation Age of Onset  . Diabetes Mother   . Uterine cancer Mother   . Diabetes Father   . Heart attack Father 6  . Stroke Father   . Heart attack Paternal Grandmother   . Heart attack Paternal Grandfather   . Stroke Sister   . Heart attack Paternal Uncle   . Bone cancer Paternal Uncle     Social History   Socioeconomic History  . Marital status: Married    Spouse name: Not on file  . Number of children: Not on file  . Years of education: Not on file  . Highest education level: Not on file  Occupational History  . Not on file  Tobacco Use  . Smoking status: Current Every Day Smoker  Packs/day: 1.50  . Smokeless tobacco: Never Used  . Tobacco comment: Since age 49.  Vaping Use  . Vaping Use: Never used  Substance and Sexual Activity  . Alcohol use: Not Currently    Comment: Occasional  . Drug use: No  . Sexual activity: Yes    Partners: Male  Other Topics Concern  . Not on file  Social History Narrative  . Not on file   Social Determinants of Health   Financial Resource Strain:   . Difficulty of Paying Living  Expenses:   Food Insecurity:   . Worried About Programme researcher, broadcasting/film/videounning Out of Food in the Last Year:   . Baristaan Out of Food in the Last Year:   Transportation Needs:   . Freight forwarderLack of Transportation (Medical):   Marland Kitchen. Lack of Transportation (Non-Medical):   Physical Activity:   . Days of Exercise per Week:   . Minutes of Exercise per Session:   Stress:   . Feeling of Stress :   Social Connections:   . Frequency of Communication with Friends and Family:   . Frequency of Social Gatherings with Friends and Family:   . Attends Religious Services:   . Active Member of Clubs or Organizations:   . Attends BankerClub or Organization Meetings:   Marland Kitchen. Marital Status:   Intimate Partner Violence:   . Fear of Current or Ex-Partner:   . Emotionally Abused:   Marland Kitchen. Physically Abused:   . Sexually Abused:     Outpatient Medications Prior to Visit  Medication Sig Dispense Refill  . albuterol (PROVENTIL HFA;VENTOLIN HFA) 108 (90 Base) MCG/ACT inhaler Inhale 2 puffs into the lungs every 6 (six) hours as needed for wheezing or shortness of breath. 1 Inhaler 6  . aspirin 325 MG EC tablet Take 1 tablet (325 mg total) by mouth daily. 30 tablet 3  . atorvastatin (LIPITOR) 10 MG tablet Take 1 tablet by mouth once daily 30 tablet 0  . busPIRone (BUSPAR) 5 MG tablet Take 1 tablet by mouth twice daily 60 tablet 0  . furosemide (LASIX) 20 MG tablet Take 1 tablet by mouth twice daily as needed 60 tablet 6  . glipiZIDE (GLUCOTROL) 10 MG tablet TAKE 1 TABLET BY MOUTH TWICE DAILY BEFORE A MEAL 60 tablet 6  . insulin glargine (LANTUS) 100 UNIT/ML injection Inject 0.2 mLs (20 Units total) into the skin at bedtime. 10 mL 11  . Insulin Pen Needle (BD ULTRA-FINE PEN NEEDLES) 29G X 12.7MM MISC ICD10 E11.9 for use with insulin administration 200 each 2  . Insulin Syringes, Disposable, U-100 0.3 ML MISC 1 each by Does not apply route at bedtime. 100 each 11  . lisinopril (ZESTRIL) 40 MG tablet Take 1 tablet by mouth once daily 30 tablet 6  . metFORMIN  (GLUCOPHAGE) 1000 MG tablet TAKE 1 TABLET BY MOUTH TWICE DAILY WITH A MEAL 60 tablet 0  . omeprazole (PRILOSEC) 40 MG capsule Take 1 capsule by mouth once daily 30 capsule 6  . thiamine 100 MG tablet Take 1 tablet (100 mg total) by mouth daily. 30 tablet 0  . traZODone (DESYREL) 100 MG tablet Take 1 tablet (100 mg total) by mouth at bedtime as needed for sleep. 30 tablet 3   No facility-administered medications prior to visit.    No Known Allergies  ROS Review of Systems  Constitutional: Negative.   HENT: Negative.   Eyes: Negative.   Respiratory: Negative.   Cardiovascular: Negative.   Gastrointestinal: Negative.   Endocrine: Negative.   Genitourinary:  Negative.   Musculoskeletal: Positive for arthralgias (generalized joint pain).  Skin: Negative.   Allergic/Immunologic: Negative.   Neurological: Positive for dizziness (occasional ) and headaches (occasional).  Hematological: Negative.   Psychiatric/Behavioral: Negative.       Objective:    Physical Exam Vitals and nursing note reviewed.  Constitutional:      Appearance: Normal appearance.  HENT:     Head: Normocephalic and atraumatic.     Nose: Nose normal.     Mouth/Throat:     Mouth: Mucous membranes are moist.     Pharynx: Oropharynx is clear.  Cardiovascular:     Rate and Rhythm: Normal rate and regular rhythm.     Pulses: Normal pulses.     Heart sounds: Normal heart sounds.  Pulmonary:     Effort: Pulmonary effort is normal.     Breath sounds: Normal breath sounds.  Abdominal:     General: Bowel sounds are normal.     Palpations: Abdomen is soft.  Musculoskeletal:        General: Normal range of motion.     Cervical back: Normal range of motion and neck supple.  Skin:    General: Skin is warm and dry.  Neurological:     General: No focal deficit present.     Mental Status: She is alert and oriented to person, place, and time.  Psychiatric:        Mood and Affect: Mood normal.        Behavior:  Behavior normal.        Thought Content: Thought content normal.        Judgment: Judgment normal.     BP (!) 164/67   Pulse 79   Temp (!) 97.2 F (36.2 C) (Temporal)   Ht 5\' 4"  (1.626 m)   Wt 157 lb 12.8 oz (71.6 kg)   LMP  (LMP Unknown)   SpO2 100%   BMI 27.09 kg/m  Wt Readings from Last 3 Encounters:  04/07/20 157 lb 12.8 oz (71.6 kg)  11/24/19 162 lb 3.2 oz (73.6 kg)  08/25/19 167 lb 12.8 oz (76.1 kg)     Health Maintenance Due  Topic Date Due  . OPHTHALMOLOGY EXAM  Never done  . COVID-19 Vaccine (1) Never done  . FOOT EXAM  01/31/2019  . PAP SMEAR-Modifier  10/15/2019  . MAMMOGRAM  Never done  . COLONOSCOPY  Never done    There are no preventive care reminders to display for this patient.  Lab Results  Component Value Date   TSH 1.080 04/21/2019   Lab Results  Component Value Date   WBC 5.6 04/21/2019   HGB 11.5 04/21/2019   HCT 33.2 (L) 04/21/2019   MCV 86 04/21/2019   PLT 223 04/21/2019   Lab Results  Component Value Date   NA 144 04/21/2019   K 4.5 04/21/2019   CO2 19 (L) 04/21/2019   GLUCOSE 151 (H) 04/21/2019   BUN 7 04/21/2019   CREATININE 0.60 04/21/2019   BILITOT 0.2 04/21/2019   ALKPHOS 72 04/21/2019   AST 30 04/21/2019   ALT 31 04/21/2019   PROT 6.7 04/21/2019   ALBUMIN 4.1 04/21/2019   CALCIUM 9.5 04/21/2019   ANIONGAP 7 12/16/2017   Lab Results  Component Value Date   CHOL 106 04/21/2019   Lab Results  Component Value Date   HDL 34 (L) 04/21/2019   Lab Results  Component Value Date   LDLCALC 60 04/21/2019   Lab Results  Component Value  Date   TRIG 59 04/21/2019   Lab Results  Component Value Date   CHOLHDL 3.1 04/21/2019   Lab Results  Component Value Date   HGBA1C 9.6 (A) 04/07/2020   HGBA1C 9.6 04/07/2020   HGBA1C 9.6 (A) 04/07/2020   HGBA1C 9.6 (A) 04/07/2020      Assessment & Plan:   1. Type 2 diabetes mellitus without complication, without long-term current use of insulin (HCC) She will continue  medication as prescribed, to decrease foods/beverages high in sugars and carbs and follow Heart Healthy or DASH diet. Increase physical activity to at least 30 minutes cardio exercise daily.  - HgB A1c - POCT Urinalysis Dipstick - CBC; Future - Comprehensive metabolic panel - Lipid Panel - TSH - Vitamin B12 - Vitamin D, 25-hydroxy  2. Hemoglobin A1C greater than 9%, indicating poor diabetic control Improved. Hgb A1c decreased at 9.6 today, from 11.0 on 11/24/2019. Monitor.   3. Tobacco use  4. Encounter for smoking cessation counseling  5. Shortness of breath Stable. No signs or symptoms noted or reported.   6. Anxiety  7. Elevated blood pressure reading in office with diagnosis of hypertension Blood pressures are elevated today. She denies severe headaches, confusion, seizures, double vision, and blurred vision, nausea and vomiting. She will report to ED if she experiences these symptoms. Patient verbalized understanding. She will continue hypertensive medications as prescribed.   8. Essential hypertension  9. Follow up She will follow up in 3 months.    No orders of the defined types were placed in this encounter.   Orders Placed This Encounter  Procedures  . CBC  . Comprehensive metabolic panel  . Lipid Panel  . TSH  . Vitamin B12  . Vitamin D, 25-hydroxy  . HgB A1c  . POCT Urinalysis Dipstick    Referral Orders  No referral(s) requested today    Raliegh Ip,  MSN, FNP-BC [facility] [address] [place], [place] [address] [phone] fax   Problem List Items Addressed This Visit      Cardiovascular and Mediastinum   Essential hypertension     Endocrine   Type 2 diabetes mellitus without complication, without long-term current use of insulin (HCC) - Primary   Relevant Orders   HgB A1c (Completed)   POCT Urinalysis Dipstick (Completed)   CBC    Comprehensive metabolic panel   Lipid Panel   TSH   Vitamin B12   Vitamin D, 25-hydroxy     Other   Anxiety   Shortness of breath   Tobacco use    Other Visit Diagnoses    Hemoglobin A1C greater than 9%, indicating poor diabetic control       Encounter for smoking cessation counseling       Elevated blood pressure reading in office with diagnosis of hypertension       Follow up          No orders of the defined types were placed in this encounter.   Follow-up: Return in about 3 months (around 07/08/2020).    Kallie Locks, FNP

## 2020-04-14 ENCOUNTER — Ambulatory Visit: Payer: Self-pay

## 2020-04-14 ENCOUNTER — Other Ambulatory Visit: Payer: Self-pay

## 2020-04-14 VITALS — BP 146/75

## 2020-04-14 DIAGNOSIS — I10 Essential (primary) hypertension: Secondary | ICD-10-CM

## 2020-04-14 NOTE — Progress Notes (Unsigned)
Pt states she is here for her BP check which is 162/74 2nd time checked her BP was 146/75

## 2020-04-28 ENCOUNTER — Other Ambulatory Visit: Payer: Self-pay | Admitting: Family Medicine

## 2020-04-28 DIAGNOSIS — E119 Type 2 diabetes mellitus without complications: Secondary | ICD-10-CM

## 2020-05-29 ENCOUNTER — Other Ambulatory Visit: Payer: Self-pay | Admitting: Family Medicine

## 2020-05-29 DIAGNOSIS — E119 Type 2 diabetes mellitus without complications: Secondary | ICD-10-CM

## 2020-07-07 ENCOUNTER — Telehealth (INDEPENDENT_AMBULATORY_CARE_PROVIDER_SITE_OTHER): Payer: Self-pay | Admitting: Family Medicine

## 2020-07-07 DIAGNOSIS — Z09 Encounter for follow-up examination after completed treatment for conditions other than malignant neoplasm: Secondary | ICD-10-CM

## 2020-07-07 DIAGNOSIS — I10 Essential (primary) hypertension: Secondary | ICD-10-CM

## 2020-07-07 DIAGNOSIS — R7309 Other abnormal glucose: Secondary | ICD-10-CM

## 2020-07-07 DIAGNOSIS — F419 Anxiety disorder, unspecified: Secondary | ICD-10-CM

## 2020-07-07 DIAGNOSIS — E119 Type 2 diabetes mellitus without complications: Secondary | ICD-10-CM

## 2020-07-07 NOTE — Progress Notes (Signed)
Virtual Visit via Telephone Note  I connected with Misty Mccullough on 07/13/20 at  3:00 PM EDT by telephone and verified that I am speaking with the correct person using two identifiers.   I discussed the limitations, risks, security and privacy concerns of performing an evaluation and management service by telephone and the availability of in person appointments. I also discussed with the patient that there may be a patient responsible charge related to this service. The patient expressed understanding and agreed to proceed.  Televisit Today Patient Location: Home Provider Location: Office   History of Present Illness:  Patient Active Problem List   Diagnosis Date Noted  . Shortness of breath 08/25/2019  . Anxiety 08/25/2019  . Hyperglycemia 08/25/2019  . Hemoglobin A1C between 7% and 9% indicating borderline diabetic control 08/25/2019  . Type 2 diabetes mellitus without complication, without long-term current use of insulin (HCC) 11/24/2018  . Bilateral lower extremity edema 11/24/2018  . Pain and swelling of toe, left 11/04/2018  . Insomnia 11/04/2018  . Tobacco dependence 02/05/2018  . History of CVA (cerebrovascular accident) 02/05/2018  . Tobacco use 12/16/2017  . Acute CVA (cerebrovascular accident) (HCC) 12/15/2017  . Malnutrition of moderate degree 08/14/2016  . Chest pain 08/13/2016  . Back pain 08/13/2016  . Essential hypertension 08/13/2016  . HLD (hyperlipidemia) 08/13/2016  . Diabetes mellitus with complication (HCC) 08/13/2016  . Chest pain, exertional    Current Status: Since her last office visit, she is doing well with no complaints. She denies fevers, chills, fatigue, recent infections, weight loss, and night sweats. She has not had any headaches, visual changes, dizziness, and falls. No chest pain, heart palpitations, cough and shortness of breath reported. Denies GI problems such as nausea, vomiting, diarrhea, and constipation. She has no reports of blood in  stools, dysuria and hematuria. No depression or anxiety, and denies suicidal ideations, homicidal ideations, or auditory hallucinations. She is taking all medications as prescribed. She denies pain today.   Observations/Objective:  Telephone Visit  Assessment and Plan:  1. Essential hypertension She will continue to take medications as prescribed, to decrease high sodium intake, excessive alcohol intake, increase potassium intake, smoking cessation, and increase physical activity of at least 30 minutes of cardio activity daily. She will continue to follow Heart Healthy or DASH diet.  2. Type 2 diabetes mellitus without complication, without long-term current use of insulin (HCC) She will continue medication as prescribed, to decrease foods/beverages high in sugars and carbs and follow Heart Healthy or DASH diet. Increase physical activity to at least 30 minutes cardio exercise daily.   3. Hemoglobin A1C greater than 9%, indicating poor diabetic control Most recent Hgb A1c at 9.6. Monitor.   4. Anxiety  5. Follow up She will follow up in 1 month.   No orders of the defined types were placed in this encounter.   No orders of the defined types were placed in this encounter.   Referral Orders  No referral(s) requested today    Raliegh Ip,  MSN, FNP-BC [facility] [address] [place], [place] [address] [phone]- fax  I discussed the assessment and treatment plan with the patient. The patient was provided an opportunity to ask questions and all were answered. The patient agreed with the plan and demonstrated an understanding of the instructions.   The patient was advised to call back or seek an in-person evaluation if the symptoms worsen or if the  condition fails to improve as anticipated.  I provided 20 minutes of non-face-to-face time during this encounter.   Kallie Locks, FNP

## 2020-07-13 ENCOUNTER — Encounter: Payer: Self-pay | Admitting: Family Medicine

## 2020-08-09 ENCOUNTER — Encounter: Payer: Self-pay | Admitting: Family Medicine

## 2020-08-09 ENCOUNTER — Other Ambulatory Visit: Payer: Self-pay | Admitting: Family Medicine

## 2020-08-09 ENCOUNTER — Other Ambulatory Visit: Payer: Self-pay

## 2020-08-09 ENCOUNTER — Ambulatory Visit (INDEPENDENT_AMBULATORY_CARE_PROVIDER_SITE_OTHER): Payer: Self-pay | Admitting: Family Medicine

## 2020-08-09 VITALS — BP 152/72 | HR 80 | Resp 17 | Ht 64.0 in | Wt 163.8 lb

## 2020-08-09 DIAGNOSIS — Z09 Encounter for follow-up examination after completed treatment for conditions other than malignant neoplasm: Secondary | ICD-10-CM

## 2020-08-09 DIAGNOSIS — R7309 Other abnormal glucose: Secondary | ICD-10-CM

## 2020-08-09 DIAGNOSIS — R739 Hyperglycemia, unspecified: Secondary | ICD-10-CM

## 2020-08-09 DIAGNOSIS — E119 Type 2 diabetes mellitus without complications: Secondary | ICD-10-CM

## 2020-08-09 DIAGNOSIS — E118 Type 2 diabetes mellitus with unspecified complications: Secondary | ICD-10-CM

## 2020-08-09 DIAGNOSIS — I10 Essential (primary) hypertension: Secondary | ICD-10-CM

## 2020-08-09 DIAGNOSIS — F419 Anxiety disorder, unspecified: Secondary | ICD-10-CM

## 2020-08-09 LAB — GLUCOSE, POCT (MANUAL RESULT ENTRY): POC Glucose: 345 mg/dl — AB (ref 70–99)

## 2020-08-09 LAB — POCT GLYCOSYLATED HEMOGLOBIN (HGB A1C)
HbA1c POC (<> result, manual entry): 9.3 % (ref 4.0–5.6)
HbA1c, POC (controlled diabetic range): 9.3 % — AB (ref 0.0–7.0)
HbA1c, POC (prediabetic range): 9.3 % — AB (ref 5.7–6.4)
Hemoglobin A1C: 9.3 % — AB (ref 4.0–5.6)

## 2020-08-09 LAB — POCT URINALYSIS DIPSTICK
Bilirubin, UA: NEGATIVE
Glucose, UA: NEGATIVE
Ketones, UA: NEGATIVE
Leukocytes, UA: NEGATIVE
Nitrite, UA: NEGATIVE
Protein, UA: NEGATIVE
Spec Grav, UA: 1.01 (ref 1.010–1.025)
Urobilinogen, UA: 0.2 E.U./dL
pH, UA: 5.5 (ref 5.0–8.0)

## 2020-08-09 MED ORDER — ALPRAZOLAM 0.5 MG PO TABS: 0.5000 mg | ORAL_TABLET | Freq: Every evening | ORAL | 0 refills | Status: DC | PRN

## 2020-08-09 MED ORDER — INSULIN GLARGINE 100 UNIT/ML ~~LOC~~ SOLN: 20.0000 [IU] | Freq: Every day | SUBCUTANEOUS | 11 refills | Status: DC

## 2020-08-09 MED ORDER — BUSPIRONE HCL 10 MG PO TABS: 10.0000 mg | ORAL_TABLET | Freq: Three times a day (TID) | ORAL | 6 refills | Status: DC

## 2020-08-09 MED FILL — !LANTUS 100 UNITS/ML VIAL: 100 | 28 days supply | Qty: 10 | Fill #0

## 2020-08-09 MED FILL — busPIRone HCL 10 MG TABS: 10 | 20 days supply | Qty: 60 | Fill #0

## 2020-08-09 NOTE — Progress Notes (Signed)
[facility]   Established Patient Office Visit  Subjective:  Patient ID: Misty Mccullough, female    DOB: 09/17/1971  Age: 49 y.o. MRN: [id number]  CC:  Chief Complaint  Patient presents with  . Follow-up    HPI Misty Mccullough is a 49 year old female who presents for Follow Up today.   Patient Active Problem List   Diagnosis Date Noted  . Shortness of breath 08/25/2019  . Anxiety 08/25/2019  . Hyperglycemia 08/25/2019  . Hemoglobin A1C between 7% and 9% indicating borderline diabetic control 08/25/2019  . Type 2 diabetes mellitus without complication, without long-term current use of insulin (HCC) 11/24/2018  . Bilateral lower extremity edema 11/24/2018  . Pain and swelling of toe, left 11/04/2018  . Insomnia 11/04/2018  . Tobacco dependence 02/05/2018  . History of CVA (cerebrovascular accident) 02/05/2018  . Tobacco use 12/16/2017  . Acute CVA (cerebrovascular accident) (HCC) 12/15/2017  . Malnutrition of moderate degree 08/14/2016  . Chest pain 08/13/2016  . Back pain 08/13/2016  . Essential hypertension 08/13/2016  . HLD (hyperlipidemia) 08/13/2016  . Diabetes mellitus with complication (HCC) 08/13/2016  . Chest pain, exertional    Current Status: Since her last office visit, she is doing well with no complaints. Her anxiety is moderated today r/t personal and family stressors. She denies suicidal ideations, homicidal ideations, or auditory hallucinations. She denies fevers, chills, fatigue, recent infections, weight loss, and night sweats. She has not had any headaches, visual changes, dizziness, and falls. No chest pain, heart palpitations, cough and shortness of breath reported. Denies GI problems such as nausea, vomiting, diarrhea, and constipation. She has no reports of blood in stools, dysuria and hematuria.  She is taking all medications as prescribed. She denies pain today.   Past Medical History:  Diagnosis Date  .  Bilateral lower extremity edema   . Cough   . Diabetes mellitus without complication (HCC)   . Gallstones   . Heavy cigarette smoker   . Hemoglobin A1C between 7% and 9% indicating borderline diabetic control 08/2019  . Hypercholesteremia   . Hyperglycemia   . Hypertension   . Shortness of breath     Past Surgical History:  Procedure Laterality Date  . TUBAL LIGATION      Family History  Problem Relation Age of Onset  . Diabetes Mother   . Uterine cancer Mother   . Diabetes Father   . Heart attack Father 66  . Stroke Father   . Heart attack Paternal Grandmother   . Heart attack Paternal Grandfather   . Stroke Sister   . Heart attack Paternal Uncle   . Bone cancer Paternal Uncle     Social History   Socioeconomic History  . Marital status: Married    Spouse name: Not on file  . Number of children: Not on file  . Years of education: Not on file  . Highest education level: Not on file  Occupational History  . Not on file  Tobacco Use  . Smoking status: Current Every Day Smoker    Packs/day: 1.50  . Smokeless tobacco: Never Used  . Tobacco comment: Since age 4.  Vaping Use  . Vaping Use: Never used  Substance and Sexual Activity  . Alcohol use: Not Currently    Comment: Occasional  . Drug use: No  . Sexual activity: Yes    Partners: Male  Other Topics Concern  . Not on file  Social History Narrative  .  Not on file   Social Determinants of Health   Financial Resource Strain:   . Difficulty of Paying Living Expenses: Not on file  Food Insecurity:   . Worried About Programme researcher, broadcasting/film/video in the Last Year: Not on file  . Ran Out of Food in the Last Year: Not on file  Transportation Needs:   . Lack of Transportation (Medical): Not on file  . Lack of Transportation (Non-Medical): Not on file  Physical Activity:   . Days of Exercise per Week: Not on file  . Minutes of Exercise per Session: Not on file  Stress:   . Feeling of Stress : Not on file  Social  Connections:   . Frequency of Communication with Friends and Family: Not on file  . Frequency of Social Gatherings with Friends and Family: Not on file  . Attends Religious Services: Not on file  . Active Member of Clubs or Organizations: Not on file  . Attends Banker Meetings: Not on file  . Marital Status: Not on file  Intimate Partner Violence:   . Fear of Current or Ex-Partner: Not on file  . Emotionally Abused: Not on file  . Physically Abused: Not on file  . Sexually Abused: Not on file    Outpatient Medications Prior to Visit  Medication Sig Dispense Refill  . albuterol (PROVENTIL HFA;VENTOLIN HFA) 108 (90 Base) MCG/ACT inhaler Inhale 2 puffs into the lungs every 6 (six) hours as needed for wheezing or shortness of breath. 1 Inhaler 6  . aspirin 325 MG EC tablet Take 1 tablet (325 mg total) by mouth daily. 30 tablet 3  . atorvastatin (LIPITOR) 10 MG tablet Take 1 tablet by mouth once daily 30 tablet 0  . furosemide (LASIX) 20 MG tablet Take 1 tablet by mouth twice daily as needed 60 tablet 6  . glipiZIDE (GLUCOTROL) 10 MG tablet TAKE 1 TABLET BY MOUTH TWICE DAILY BEFORE A MEAL 60 tablet 6  . Insulin Pen Needle (BD ULTRA-FINE PEN NEEDLES) 29G X 12.7MM MISC ICD10 E11.9 for use with insulin administration 200 each 2  . Insulin Syringes, Disposable, U-100 0.3 ML MISC 1 each by Does not apply route at bedtime. 100 each 11  . lisinopril (ZESTRIL) 40 MG tablet Take 1 tablet by mouth once daily 30 tablet 6  . metFORMIN (GLUCOPHAGE) 1000 MG tablet TAKE 1 TABLET BY MOUTH TWICE DAILY WITH A MEAL 60 tablet 0  . omeprazole (PRILOSEC) 40 MG capsule Take 1 capsule by mouth once daily 30 capsule 6  . thiamine 100 MG tablet Take 1 tablet (100 mg total) by mouth daily. 30 tablet 0  . traZODone (DESYREL) 100 MG tablet Take 1 tablet (100 mg total) by mouth at bedtime as needed for sleep. 30 tablet 3  . busPIRone (BUSPAR) 5 MG tablet Take 1 tablet by mouth twice daily (Patient not taking:  Reported on 07/07/2020) 60 tablet 0  . insulin glargine (LANTUS) 100 UNIT/ML injection Inject 0.2 mLs (20 Units total) into the skin at bedtime. (Patient not taking: Reported on 08/09/2020) 10 mL 11   No facility-administered medications prior to visit.    No Known Allergies  ROS Review of Systems  Constitutional: Negative.   HENT: Negative.   Eyes: Negative.   Respiratory: Negative.   Cardiovascular: Negative.   Gastrointestinal: Negative.   Endocrine: Negative.   Genitourinary: Negative.   Musculoskeletal: Positive for arthralgias (generalized ).  Skin: Negative.   Allergic/Immunologic: Negative.   Neurological: Positive for dizziness (  occasional ) and headaches (occasional ).  Hematological: Negative.   Psychiatric/Behavioral: The patient is nervous/anxious.        Increased family anxiety      Objective:    Physical Exam Vitals and nursing note reviewed.  Constitutional:      Appearance: Normal appearance.  HENT:     Head: Normocephalic and atraumatic.     Nose: Nose normal.     Mouth/Throat:     Mouth: Mucous membranes are moist.     Pharynx: Oropharynx is clear.  Cardiovascular:     Rate and Rhythm: Normal rate and regular rhythm.     Pulses: Normal pulses.     Heart sounds: Normal heart sounds.  Pulmonary:     Effort: Pulmonary effort is normal.     Breath sounds: Normal breath sounds.  Abdominal:     General: Bowel sounds are normal.     Palpations: Abdomen is soft.  Musculoskeletal:        General: Normal range of motion.     Cervical back: Normal range of motion and neck supple.  Skin:    General: Skin is warm and dry.  Neurological:     General: No focal deficit present.     Mental Status: She is alert and oriented to person, place, and time.  Psychiatric:     Comments: Anxiety r/t personal and family stressors     BP (!) 152/72 (BP Location: Left Arm, Patient Position: Sitting, Cuff Size: Normal)   Pulse 80   Resp 17   Wt 163 lb 12.8 oz  (74.3 kg)   SpO2 100%   BMI 28.12 kg/m  Wt Readings from Last 3 Encounters:  08/09/20 163 lb 12.8 oz (74.3 kg)  04/07/20 157 lb 12.8 oz (71.6 kg)  11/24/19 162 lb 3.2 oz (73.6 kg)     Health Maintenance Due  Topic Date Due  . OPHTHALMOLOGY EXAM  Never done  . COVID-19 Vaccine (1) Never done  . FOOT EXAM  01/31/2019  . PAP SMEAR-Modifier  10/15/2019  . MAMMOGRAM  Never done  . COLONOSCOPY  Never done  . INFLUENZA VACCINE  05/07/2020    There are no preventive care reminders to display for this patient.  Lab Results  Component Value Date   TSH 1.080 04/21/2019   Lab Results  Component Value Date   WBC 5.6 04/21/2019   HGB 11.5 04/21/2019   HCT 33.2 (L) 04/21/2019   MCV 86 04/21/2019   PLT 223 04/21/2019   Lab Results  Component Value Date   NA 144 04/21/2019   K 4.5 04/21/2019   CO2 19 (L) 04/21/2019   GLUCOSE 151 (H) 04/21/2019   BUN 7 04/21/2019   CREATININE 0.60 04/21/2019   BILITOT 0.2 04/21/2019   ALKPHOS 72 04/21/2019   AST 30 04/21/2019   ALT 31 04/21/2019   PROT 6.7 04/21/2019   ALBUMIN 4.1 04/21/2019   CALCIUM 9.5 04/21/2019   ANIONGAP 7 12/16/2017   Lab Results  Component Value Date   CHOL 106 04/21/2019   Lab Results  Component Value Date   HDL 34 (L) 04/21/2019   Lab Results  Component Value Date   LDLCALC 60 04/21/2019   Lab Results  Component Value Date   TRIG 59 04/21/2019   Lab Results  Component Value Date   CHOLHDL 3.1 04/21/2019   Lab Results  Component Value Date   HGBA1C 9.3 (A) 08/09/2020   HGBA1C 9.3 08/09/2020   HGBA1C 9.3 (A) 08/09/2020  HGBA1C 9.3 (A) 08/09/2020      Assessment & Plan:   1. Type 2 diabetes mellitus without complication, without long-term current use of insulin (HCC) She will continue medication as prescribed, to decrease foods/beverages high in sugars and carbs and follow Heart Healthy or DASH diet. Increase physical activity to at least 30 minutes cardio exercise daily.  - Urinalysis  Dipstick - POC Glucose (CBG) - POC HgB A1c  2. Hemoglobin A1C greater than 9%, indicating poor diabetic control Hgb A1c at 9.3 today. Monitor.  3. Diabetes mellitus with complication (HCC) She will continue medication as prescribed, to decrease foods/beverages high in sugars and carbs and follow Heart Healthy or DASH diet. Increase physical activity to at least 30 minutes cardio exercise daily.  - insulin glargine (LANTUS) 100 UNIT/ML injection; Inject 0.2 mLs (20 Units total) into the skin at bedtime.  Dispense: 10 mL; Refill: 11  4. Hyperglycemia  5. Essential hypertension The current medical regimen is effective; blood pressure is stable today; continue present plan and medications as prescribed. She will continue to take medications as prescribed, to decrease high sodium intake, excessive alcohol intake, increase potassium intake, smoking cessation, and increase physical activity of at least 30 minutes of cardio activity daily. She will continue to follow Heart Healthy or DASH diet.  6. Anxiety - ALPRAZolam (XANAX) 0.5 MG tablet; Take 1 tablet (0.5 mg total) by mouth at bedtime as needed.  Dispense: 30 tablet; Refill: 0 - busPIRone (BUSPAR) 10 MG tablet; Take 1 tablet (10 mg total) by mouth 3 (three) times daily.  Dispense: 60 tablet; Refill: 6  7. Follow up She will follow up in 3 months.   Meds ordered this encounter  Medications  . ALPRAZolam (XANAX) 0.5 MG tablet    Sig: Take 1 tablet (0.5 mg total) by mouth at bedtime as needed.    Dispense:  30 tablet    Refill:  0  . busPIRone (BUSPAR) 10 MG tablet    Sig: Take 1 tablet (10 mg total) by mouth 3 (three) times daily.    Dispense:  60 tablet    Refill:  6  . insulin glargine (LANTUS) 100 UNIT/ML injection    Sig: Inject 0.2 mLs (20 Units total) into the skin at bedtime.    Dispense:  10 mL    Refill:  11    Orders Placed This Encounter  Procedures  . Urinalysis Dipstick  . POC Glucose (CBG)  . POC HgB A1c     Referral Orders  No referral(s) requested today    Raliegh Ip,  MSN, FNP-BC [facility] [address] [place], [place] [address] [phone] fax  Problem List Items Addressed This Visit      Cardiovascular and Mediastinum   Essential hypertension     Endocrine   Diabetes mellitus with complication (HCC)   Relevant Medications   insulin glargine (LANTUS) 100 UNIT/ML injection   Type 2 diabetes mellitus without complication, without long-term current use of insulin (HCC) - Primary   Relevant Medications   insulin glargine (LANTUS) 100 UNIT/ML injection   Other Relevant Orders   Urinalysis Dipstick (Completed)   POC Glucose (CBG) (Completed)   POC HgB A1c (Completed)     Other   Anxiety   Relevant Medications   ALPRAZolam (XANAX) 0.5 MG tablet   busPIRone (BUSPAR) 10 MG tablet   Hyperglycemia    Other Visit Diagnoses    Hemoglobin A1C  greater than 9%, indicating poor diabetic control       Relevant Medications   insulin glargine (LANTUS) 100 UNIT/ML injection   Follow up          Meds ordered this encounter  Medications  . ALPRAZolam (XANAX) 0.5 MG tablet    Sig: Take 1 tablet (0.5 mg total) by mouth at bedtime as needed.    Dispense:  30 tablet    Refill:  0  . busPIRone (BUSPAR) 10 MG tablet    Sig: Take 1 tablet (10 mg total) by mouth 3 (three) times daily.    Dispense:  60 tablet    Refill:  6  . insulin glargine (LANTUS) 100 UNIT/ML injection    Sig: Inject 0.2 mLs (20 Units total) into the skin at bedtime.    Dispense:  10 mL    Refill:  11    Follow-up: Return in about 3 months (around 11/09/2020).    Kallie LocksNatalie M Chia Mowers, FNP

## 2020-08-09 NOTE — Patient Instructions (Signed)
Generalized Anxiety Disorder, Adult Generalized anxiety disorder (GAD) is a mental health disorder. People with this condition constantly worry about everyday events. Unlike normal anxiety, worry related to GAD is not triggered by a specific event. These worries also do not fade or get better with time. GAD interferes with life functions, including relationships, work, and school. GAD can vary from mild to severe. People with severe GAD can have intense waves of anxiety with physical symptoms (panic attacks). What are the causes? The exact cause of GAD is not known. What increases the risk? This condition is more likely to develop in:  Women.  People who have a family history of anxiety disorders.  People who are very shy.  People who experience very stressful life events, such as the death of a loved one.  People who have a very stressful family environment. What are the signs or symptoms? People with GAD often worry excessively about many things in their lives, such as their health and family. They may also be overly concerned about:  Doing well at work.  Being on time.  Natural disasters.  Friendships. Physical symptoms of GAD include:  Fatigue.  Muscle tension or having muscle twitches.  Trembling or feeling shaky.  Being easily startled.  Feeling like your heart is pounding or racing.  Feeling out of breath or like you cannot take a deep breath.  Having trouble falling asleep or staying asleep.  Sweating.  Nausea, diarrhea, or irritable bowel syndrome (IBS).  Headaches.  Trouble concentrating or remembering facts.  Restlessness.  Irritability. How is this diagnosed? Your health care provider can diagnose GAD based on your symptoms and medical history. You will also have a physical exam. The health care provider will ask specific questions about your symptoms, including how severe they are, when they started, and if they come and go. Your health care  provider may ask you about your use of alcohol or drugs, including prescription medicines. Your health care provider may refer you to a mental health specialist for further evaluation. Your health care provider will do a thorough examination and may perform additional tests to rule out other possible causes of your symptoms. To be diagnosed with GAD, a person must have anxiety that:  Is out of his or her control.  Affects several different aspects of his or her life, such as work and relationships.  Causes distress that makes him or her unable to take part in normal activities.  Includes at least three physical symptoms of GAD, such as restlessness, fatigue, trouble concentrating, irritability, muscle tension, or sleep problems. Before your health care provider can confirm a diagnosis of GAD, these symptoms must be present more days than they are not, and they must last for six months or longer. How is this treated? The following therapies are usually used to treat GAD:  Medicine. Antidepressant medicine is usually prescribed for long-term daily control. Antianxiety medicines may be added in severe cases, especially when panic attacks occur.  Talk therapy (psychotherapy). Certain types of talk therapy can be helpful in treating GAD by providing support, education, and guidance. Options include: ? Cognitive behavioral therapy (CBT). People learn coping skills and techniques to ease their anxiety. They learn to identify unrealistic or negative thoughts and behaviors and to replace them with positive ones. ? Acceptance and commitment therapy (ACT). This treatment teaches people how to be mindful as a way to cope with unwanted thoughts and feelings. ? Biofeedback. This process trains you to manage your body's response (  physiological response) through breathing techniques and relaxation methods. You will work with a therapist while machines are used to monitor your physical symptoms.  Stress  management techniques. These include yoga, meditation, and exercise. A mental health specialist can help determine which treatment is best for you. Some people see improvement with one type of therapy. However, other people require a combination of therapies. Follow these instructions at home:  Take over-the-counter and prescription medicines only as told by your health care provider.  Try to maintain a normal routine.  Try to anticipate stressful situations and allow extra time to manage them.  Practice any stress management or self-calming techniques as taught by your health care provider.  Do not punish yourself for setbacks or for not making progress.  Try to recognize your accomplishments, even if they are small.  Keep all follow-up visits as told by your health care provider. This is important. Contact a health care provider if:  Your symptoms do not get better.  Your symptoms get worse.  You have signs of depression, such as: ? A persistently sad, cranky, or irritable mood. ? Loss of enjoyment in activities that used to bring you joy. ? Change in weight or eating. ? Changes in sleeping habits. ? Avoiding friends or family members. ? Loss of energy for normal tasks. ? Feelings of guilt or worthlessness. Get help right away if:  You have serious thoughts about hurting yourself or others. If you ever feel like you may hurt yourself or others, or have thoughts about taking your own life, get help right away. You can go to your nearest emergency department or call:  Your local emergency services (911 in the [place]  A suicide crisis helpline, such as the National Suicide Prevention Lifeline at 1-800-273-8255. This is open 24 hours a day. Summary  Generalized anxiety disorder (GAD) is a mental health disorder that involves worry that is not triggered by a specific event.  People with GAD often worry excessively about many things in their lives, such as their health and  family.  GAD may cause physical symptoms such as restlessness, trouble concentrating, sleep problems, frequent sweating, nausea, diarrhea, headaches, and trembling or muscle twitching.  A mental health specialist can help determine which treatment is best for you. Some people see improvement with one type of therapy. However, other people require a combination of therapies. This information is not intended to replace advice given to you by your health care provider. Make sure you discuss any questions you have with your health care provider. Document Revised: 09/05/2017 Document Reviewed: 08/13/2016 Elsevier Patient Education  2020 [employer] Alprazolam tablets What is this medicine? ALPRAZOLAM (al PRAY zoe lam) is a benzodiazepine. It is used to treat anxiety and panic attacks. This medicine may be used for other purposes; ask your health care provider or pharmacist if you have questions. COMMON BRAND NAME(S): Xanax What should I tell my health care provider before I take this medicine? They need to know if you have any of these conditions:  an alcohol or drug abuse problem  bipolar disorder, depression, psychosis or other mental health conditions  glaucoma  kidney or liver disease  lung or breathing disease  myasthenia gravis  Parkinson's disease  porphyria  seizures or a history of seizures  suicidal thoughts  an unusual or allergic reaction to alprazolam, other benzodiazepines, foods, dyes, or preservatives  pregnant or trying to get pregnant  breast-feeding How should I use this medicine? Take this medicine by mouth with   a glass of water. Follow the directions on the prescription label. Take your medicine at regular intervals. Do not take it more often than directed. Do not stop taking except on your doctor's advice. A special MedGuide will be given to you by the pharmacist with each prescription and refill. Be sure to read this information carefully each  time. Talk to your pediatrician regarding the use of this medicine in children. Special care may be needed. Overdosage: If you think you have taken too much of this medicine contact a poison control center or emergency room at once. NOTE: This medicine is only for you. Do not share this medicine with others. What if I miss a dose? If you miss a dose, take it as soon as you can. If it is almost time for your next dose, take only that dose. Do not take double or extra doses. What may interact with this medicine? Do not take this medicine with any of the following medications:  certain antiviral medicines for HIV or AIDS like delavirdine, indinavir  certain medicines for fungal infections like ketoconazole and itraconazole  narcotic medicines for cough  sodium oxybate This medicine may also interact with the following medications:  alcohol  antihistamines for allergy, cough and cold  certain antibiotics like clarithromycin, erythromycin, isoniazid, rifampin, rifapentine, rifabutin, and troleandomycin  certain medicines for blood pressure, heart disease, irregular heart beat  certain medicines for depression, like amitriptyline, fluoxetine, sertraline  certain medicines for seizures like carbamazepine, oxcarbazepine, phenobarbital, phenytoin, primidone  cimetidine  cyclosporine  female hormones, like estrogens or progestins and birth control pills, patches, rings, or injections  general anesthetics like halothane, isoflurane, methoxyflurane, propofol  grapefruit juice  local anesthetics like lidocaine, pramoxine, tetracaine  medicines that relax muscles for surgery  narcotic medicines for pain  other antiviral medicines for HIV or AIDS  phenothiazines like chlorpromazine, mesoridazine, prochlorperazine, thioridazine This list may not describe all possible interactions. Give your health care provider a list of all the medicines, herbs, non-prescription drugs, or dietary  supplements you use. Also tell them if you smoke, drink alcohol, or use illegal drugs. Some items may interact with your medicine. What should I watch for while using this medicine? Tell your doctor or health care professional if your symptoms do not start to get better or if they get worse. Do not stop taking except on your doctor's advice. You may develop a severe reaction. Your doctor will tell you how much medicine to take. You may get drowsy or dizzy. Do not drive, use machinery, or do anything that needs mental alertness until you know how this medicine affects you. To reduce the risk of dizzy and fainting spells, do not stand or sit up quickly, especially if you are an older patient. Alcohol may increase dizziness and drowsiness. Avoid alcoholic drinks. If you are taking another medicine that also causes drowsiness, you may have more side effects. Give your health care provider a list of all medicines you use. Your doctor will tell you how much medicine to take. Do not take more medicine than directed. Call emergency for help if you have problems breathing or unusual sleepiness. What side effects may I notice from receiving this medicine? Side effects that you should report to your doctor or health care professional as soon as possible:  allergic reactions like skin rash, itching or hives, swelling of the face, lips, or tongue  breathing problems  confusion  loss of balance or coordination  signs and symptoms of low blood   pressure like dizziness; feeling faint or lightheaded, falls; unusually weak or tired  suicidal thoughts or other mood changes Side effects that usually do not require medical attention (report to your doctor or health care professional if they continue or are bothersome):  dizziness  dry mouth  nausea, vomiting  tiredness This list may not describe all possible side effects. Call your doctor for medical advice about side effects. You may report side effects to  FDA at 1-800-FDA-1088. Where should I keep my medicine? Keep out of the reach of children. This medicine can be abused. Keep your medicine in a safe place to protect it from theft. Do not share this medicine with anyone. Selling or giving away this medicine is dangerous and against the law. Store at room temperature between 20 and 25 degrees C (68 and 77 degrees F). This medicine may cause accidental overdose and death if taken by other adults, children, or pets. Mix any unused medicine with a substance like cat litter or coffee grounds. Then throw the medicine away in a sealed container like a sealed bag or a coffee can with a lid. Do not use the medicine after the expiration date. NOTE: This sheet is a summary. It may not cover all possible information. If you have questions about this medicine, talk to your doctor, pharmacist, or health care provider.  2020 Elsevier/Gold Standard (2015-06-22 13:47:25)  

## 2020-08-14 ENCOUNTER — Encounter: Payer: Self-pay | Admitting: Family Medicine

## 2020-09-25 ENCOUNTER — Telehealth: Payer: Self-pay | Admitting: Unknown Physician Specialty

## 2020-09-25 NOTE — Telephone Encounter (Signed)
Called to discuss with Misty Mccullough about Covid symptoms and the use of  monoclonal antibody infusion for those with mild to moderate Covid symptoms and at a high risk of hospitalization.     Pt is qualified for this infusion due to co-morbid conditions and/or a member of an at-risk group, however is fully vaccinated and feeling better following 5 days of viral symptoms.  MAB is therefore not recommended  Patient Active Problem List   Diagnosis Date Noted  . Shortness of breath 08/25/2019  . Anxiety 08/25/2019  . Hyperglycemia 08/25/2019  . Hemoglobin A1C between 7% and 9% indicating borderline diabetic control 08/25/2019  . Type 2 diabetes mellitus without complication, without long-term current use of insulin (HCC) 11/24/2018  . Bilateral lower extremity edema 11/24/2018  . Pain and swelling of toe, left 11/04/2018  . Insomnia 11/04/2018  . Tobacco dependence 02/05/2018  . History of CVA (cerebrovascular accident) 02/05/2018  . Tobacco use 12/16/2017  . Acute CVA (cerebrovascular accident) (HCC) 12/15/2017  . Malnutrition of moderate degree 08/14/2016  . Chest pain 08/13/2016  . Back pain 08/13/2016  . Essential hypertension 08/13/2016  . HLD (hyperlipidemia) 08/13/2016  . Diabetes mellitus with complication (HCC) 08/13/2016  . Chest pain, exertional

## 2020-09-27 ENCOUNTER — Other Ambulatory Visit: Payer: Self-pay | Admitting: Family Medicine

## 2020-09-27 ENCOUNTER — Telehealth: Payer: Self-pay | Admitting: Family Medicine

## 2020-09-27 DIAGNOSIS — R059 Cough, unspecified: Secondary | ICD-10-CM

## 2020-09-27 MED ORDER — BENZONATATE 100 MG PO CAPS
100.0000 mg | ORAL_CAPSULE | Freq: Two times a day (BID) | ORAL | 0 refills | Status: DC | PRN
Start: 2020-09-27 — End: 2021-01-01

## 2020-09-27 NOTE — Telephone Encounter (Signed)
New Rx for Benzonatate sent to pharmacy today.

## 2020-10-03 ENCOUNTER — Other Ambulatory Visit: Payer: Self-pay | Admitting: Family Medicine

## 2020-10-03 DIAGNOSIS — E119 Type 2 diabetes mellitus without complications: Secondary | ICD-10-CM

## 2020-10-03 MED ORDER — GLIPIZIDE 10 MG PO TABS
10.0000 mg | ORAL_TABLET | Freq: Two times a day (BID) | ORAL | 3 refills | Status: DC
Start: 2020-10-03 — End: 2021-01-01

## 2020-11-02 ENCOUNTER — Other Ambulatory Visit: Payer: Self-pay | Admitting: Family Medicine

## 2020-11-02 ENCOUNTER — Telehealth: Payer: Self-pay | Admitting: Family Medicine

## 2020-11-02 DIAGNOSIS — K219 Gastro-esophageal reflux disease without esophagitis: Secondary | ICD-10-CM

## 2020-11-02 DIAGNOSIS — R600 Localized edema: Secondary | ICD-10-CM

## 2020-11-03 ENCOUNTER — Other Ambulatory Visit: Payer: Self-pay | Admitting: Family Medicine

## 2020-11-03 DIAGNOSIS — K219 Gastro-esophageal reflux disease without esophagitis: Secondary | ICD-10-CM

## 2020-11-03 DIAGNOSIS — R600 Localized edema: Secondary | ICD-10-CM

## 2020-11-03 DIAGNOSIS — I10 Essential (primary) hypertension: Secondary | ICD-10-CM

## 2020-11-03 MED ORDER — FUROSEMIDE 20 MG PO TABS: 20.0000 mg | ORAL_TABLET | Freq: Two times a day (BID) | ORAL | 0 refills | Status: DC | PRN

## 2020-11-03 MED ORDER — LISINOPRIL 40 MG PO TABS: 40.0000 mg | ORAL_TABLET | Freq: Every day | ORAL | 0 refills | Status: DC

## 2020-11-03 MED ORDER — OMEPRAZOLE 40 MG PO CPDR: 40.0000 mg | DELAYED_RELEASE_CAPSULE | Freq: Every day | ORAL | 0 refills | Status: DC

## 2020-11-03 NOTE — Telephone Encounter (Signed)
Done

## 2020-11-08 ENCOUNTER — Ambulatory Visit: Payer: Self-pay | Admitting: Family Medicine

## 2020-12-05 DIAGNOSIS — E559 Vitamin D deficiency, unspecified: Secondary | ICD-10-CM

## 2020-12-05 HISTORY — DX: Vitamin D deficiency, unspecified: E55.9

## 2021-01-01 ENCOUNTER — Encounter: Payer: Self-pay | Admitting: Family Medicine

## 2021-01-01 ENCOUNTER — Other Ambulatory Visit: Payer: Self-pay | Admitting: Family Medicine

## 2021-01-01 ENCOUNTER — Ambulatory Visit (INDEPENDENT_AMBULATORY_CARE_PROVIDER_SITE_OTHER): Payer: Self-pay | Admitting: Family Medicine

## 2021-01-01 ENCOUNTER — Other Ambulatory Visit: Payer: Self-pay

## 2021-01-01 VITALS — BP 138/76 | HR 63 | Temp 96.6°F | Ht 64.0 in | Wt 161.0 lb

## 2021-01-01 DIAGNOSIS — Z760 Encounter for issue of repeat prescription: Secondary | ICD-10-CM

## 2021-01-01 DIAGNOSIS — K219 Gastro-esophageal reflux disease without esophagitis: Secondary | ICD-10-CM

## 2021-01-01 DIAGNOSIS — E119 Type 2 diabetes mellitus without complications: Secondary | ICD-10-CM

## 2021-01-01 DIAGNOSIS — E118 Type 2 diabetes mellitus with unspecified complications: Secondary | ICD-10-CM

## 2021-01-01 DIAGNOSIS — R7309 Other abnormal glucose: Secondary | ICD-10-CM

## 2021-01-01 DIAGNOSIS — R059 Cough, unspecified: Secondary | ICD-10-CM

## 2021-01-01 DIAGNOSIS — R0602 Shortness of breath: Secondary | ICD-10-CM

## 2021-01-01 DIAGNOSIS — F419 Anxiety disorder, unspecified: Secondary | ICD-10-CM

## 2021-01-01 DIAGNOSIS — G4700 Insomnia, unspecified: Secondary | ICD-10-CM

## 2021-01-01 DIAGNOSIS — R600 Localized edema: Secondary | ICD-10-CM

## 2021-01-01 DIAGNOSIS — I10 Essential (primary) hypertension: Secondary | ICD-10-CM

## 2021-01-01 DIAGNOSIS — Z0000 Encounter for general adult medical examination without abnormal findings: Secondary | ICD-10-CM

## 2021-01-01 DIAGNOSIS — R739 Hyperglycemia, unspecified: Secondary | ICD-10-CM

## 2021-01-01 DIAGNOSIS — Z09 Encounter for follow-up examination after completed treatment for conditions other than malignant neoplasm: Secondary | ICD-10-CM

## 2021-01-01 LAB — POCT GLYCOSYLATED HEMOGLOBIN (HGB A1C)
HbA1c POC (<> result, manual entry): 11.5 % (ref 4.0–5.6)
HbA1c, POC (controlled diabetic range): 11.5 % — AB (ref 0.0–7.0)
HbA1c, POC (prediabetic range): 11.5 % — AB (ref 5.7–6.4)
Hemoglobin A1C: 11.5 % — AB (ref 4.0–5.6)

## 2021-01-01 LAB — POCT URINALYSIS DIPSTICK
Bilirubin, UA: NEGATIVE
Blood, UA: NEGATIVE
Glucose, UA: NEGATIVE
Ketones, UA: NEGATIVE
Leukocytes, UA: NEGATIVE
Nitrite, UA: NEGATIVE
Protein, UA: POSITIVE — AB
Spec Grav, UA: >=1.03 — AB (ref 1.010–1.025)
Urobilinogen, UA: NEGATIVE E.U./dL — AB
pH, UA: 5 (ref 5.0–8.0)

## 2021-01-01 MED ORDER — OMEPRAZOLE 40 MG PO CPDR: 40.0000 mg | DELAYED_RELEASE_CAPSULE | Freq: Every day | ORAL | 11 refills | Status: DC

## 2021-01-01 MED ORDER — GLIPIZIDE 10 MG PO TABS: 10.0000 mg | ORAL_TABLET | Freq: Two times a day (BID) | ORAL | 3 refills | Status: DC

## 2021-01-01 MED ORDER — LISINOPRIL 40 MG PO TABS
40.0000 mg | ORAL_TABLET | Freq: Every day | ORAL | 11 refills | Status: DC
Start: 2021-01-01 — End: 2021-01-01

## 2021-01-01 MED ORDER — THIAMINE HCL 100 MG PO TABS
100.0000 mg | ORAL_TABLET | Freq: Every day | ORAL | 11 refills | Status: DC
Start: 2021-01-01 — End: 2022-11-16

## 2021-01-01 MED ORDER — INSULIN GLARGINE 100 UNIT/ML ~~LOC~~ SOLN
20.0000 [IU] | Freq: Every day | SUBCUTANEOUS | 11 refills | Status: DC
Start: 2021-01-01 — End: 2021-06-29

## 2021-01-01 MED ORDER — TRAZODONE HCL 100 MG PO TABS: 100.0000 mg | ORAL_TABLET | Freq: Every evening | ORAL | 11 refills | Status: DC | PRN

## 2021-01-01 MED ORDER — BD PEN NEEDLE ORIGINAL U/F 29G X 12.7MM MISC: 11 refills | Status: DC

## 2021-01-01 MED ORDER — ASPIRIN 325 MG PO TBEC: 325.0000 mg | DELAYED_RELEASE_TABLET | Freq: Every day | ORAL | 11 refills | Status: DC

## 2021-01-01 MED ORDER — ATORVASTATIN CALCIUM 10 MG PO TABS
10.0000 mg | ORAL_TABLET | Freq: Every day | ORAL | 11 refills | Status: DC
Start: 2021-01-01 — End: 2021-01-01

## 2021-01-01 MED ORDER — ALBUTEROL SULFATE HFA 108 (90 BASE) MCG/ACT IN AERS: 2.0000 | INHALATION_SPRAY | Freq: Four times a day (QID) | RESPIRATORY_TRACT | 11 refills | Status: DC | PRN

## 2021-01-01 MED ORDER — FUROSEMIDE 20 MG PO TABS: 20.0000 mg | ORAL_TABLET | Freq: Two times a day (BID) | ORAL | 11 refills | Status: DC | PRN

## 2021-01-01 MED ORDER — ATORVASTATIN CALCIUM 10 MG PO TABS: 10.0000 mg | ORAL_TABLET | Freq: Every day | ORAL | 11 refills | Status: DC

## 2021-01-01 MED ORDER — GLIPIZIDE 10 MG PO TABS
10.0000 mg | ORAL_TABLET | Freq: Two times a day (BID) | ORAL | 3 refills | Status: DC
Start: 2021-01-01 — End: 2021-01-01

## 2021-01-01 MED ORDER — INSULIN GLARGINE 100 UNIT/ML ~~LOC~~ SOLN: 20.0000 [IU] | Freq: Every day | SUBCUTANEOUS | 11 refills | Status: DC

## 2021-01-01 MED ORDER — BUSPIRONE HCL 10 MG PO TABS: 10.0000 mg | ORAL_TABLET | Freq: Three times a day (TID) | ORAL | 11 refills | Status: DC

## 2021-01-01 MED ORDER — THIAMINE HCL 100 MG PO TABS: 100.0000 mg | ORAL_TABLET | Freq: Every day | ORAL | 11 refills | Status: DC

## 2021-01-01 MED ORDER — METFORMIN HCL 1000 MG PO TABS: 1000.0000 mg | ORAL_TABLET | Freq: Two times a day (BID) | ORAL | 11 refills | Status: DC

## 2021-01-01 MED ORDER — ASPIRIN 325 MG PO TBEC
325.0000 mg | DELAYED_RELEASE_TABLET | Freq: Every day | ORAL | 11 refills | Status: DC
Start: 2021-01-01 — End: 2022-11-16

## 2021-01-01 MED FILL — !VENTOLIN HFA INHALER: 108 (90 BAS | 25 days supply | Qty: 18 | Fill #0

## 2021-01-01 MED FILL — METFORMIN HCL 1000 MG TABS: 1000 | 30 days supply | Qty: 60 | Fill #0

## 2021-01-01 MED FILL — FUROSEMIDE 20 MG TABS: 20 | 30 days supply | Qty: 60 | Fill #0

## 2021-01-01 MED FILL — $LANTUS 100 UNITS/ML VIAL: 100 | 28 days supply | Qty: 10 | Fill #0

## 2021-01-01 MED FILL — LISINOPRIL 40 MG TAB: 40 | 30 days supply | Qty: 30 | Fill #0

## 2021-01-01 MED FILL — ATORVASTATIN 10 MG TABLET: 10 | 30 days supply | Qty: 30 | Fill #0

## 2021-01-01 MED FILL — TRAZODONE HCL 100 MG TABS: 100 | 30 days supply | Qty: 30 | Fill #0

## 2021-01-01 MED FILL — glipiZIDE 10 MG TABS: 10 | 30 days supply | Qty: 60 | Fill #0

## 2021-01-01 MED FILL — OMEPRAZOLE DR 40 MG CAPSULE: 40 | 30 days supply | Qty: 30 | Fill #0

## 2021-01-01 MED FILL — busPIRone HCL 10 MG TABS: 10 | 20 days supply | Qty: 60 | Fill #0

## 2021-01-01 NOTE — Progress Notes (Signed)
[facility]   Established Patient Office Visit  Subjective:  Patient ID: Misty Mccullough, female    DOB: 12-Sep-1971  Age: 50 y.o. MRN: [id number]  CC:  Chief Complaint  Patient presents with  . Follow-up    Follow up for diabetes     HPI Misty Mccullough is a 50 year old female who presents for Follow Up today.   Patient Active Problem List   Diagnosis Date Noted  . Shortness of breath 08/25/2019  . Anxiety 08/25/2019  . Hyperglycemia 08/25/2019  . Hemoglobin A1C between 7% and 9% indicating borderline diabetic control 08/25/2019  . Type 2 diabetes mellitus without complication, without long-term current use of insulin (HCC) 11/24/2018  . Bilateral lower extremity edema 11/24/2018  . Pain and swelling of toe, left 11/04/2018  . Insomnia 11/04/2018  . Tobacco dependence 02/05/2018  . History of CVA (cerebrovascular accident) 02/05/2018  . Tobacco use 12/16/2017  . Acute CVA (cerebrovascular accident) (HCC) 12/15/2017  . Malnutrition of moderate degree 08/14/2016  . Chest pain 08/13/2016  . Back pain 08/13/2016  . Essential hypertension 08/13/2016  . HLD (hyperlipidemia) 08/13/2016  . Diabetes mellitus with complication (HCC) 08/13/2016  . Chest pain, exertional    Current Status: Since her last office visit, she is doing well with no complaints. She denies fatigue, frequent urination, blurred vision, excessive hunger, excessive thirst, weight gain, weight loss, and poor wound healing. She continues to check her feet regularly. She denies visual changes, chest pain, cough, shortness of breath, heart palpitations, and falls. She has occasional headaches and dizziness with position changes. Denies severe headaches, confusion, seizures, double vision, and blurred vision, nausea and vomiting. She denies fevers, chills, recent infections, weight loss, and night sweats. Denies GI problems such as diarrhea, and constipation. She has no reports  of blood in stools, dysuria and hematuria. No depression or anxiety reported today. She is taking all medications as prescribed. She denies pain today.   Past Medical History:  Diagnosis Date  . Bilateral lower extremity edema   . Cough   . Diabetes mellitus without complication (HCC)   . Gallstones   . Heavy cigarette smoker   . Hemoglobin A1C between 7% and 9% indicating borderline diabetic control 08/2019  . Hypercholesteremia   . Hyperglycemia   . Hypertension   . Shortness of breath   . Vitamin D deficiency 12/2020    Past Surgical History:  Procedure Laterality Date  . TUBAL LIGATION      Family History  Problem Relation Age of Onset  . Diabetes Mother   . Uterine cancer Mother   . Diabetes Father   . Heart attack Father 65  . Stroke Father   . Heart attack Paternal Grandmother   . Heart attack Paternal Grandfather   . Stroke Sister   . Heart attack Paternal Uncle   . Bone cancer Paternal Uncle     Social History   Socioeconomic History  . Marital status: Married    Spouse name: Not on file  . Number of children: Not on file  . Years of education: Not on file  . Highest education level: Not on file  Occupational History  . Not on file  Tobacco Use  . Smoking status: Current Every Day Smoker    Packs/day: 1.50  . Smokeless tobacco: Never Used  . Tobacco comment: Since age 69.  Vaping Use  . Vaping Use: Never used  Substance and Sexual Activity  .  Alcohol use: Not Currently    Comment: Occasional  . Drug use: No  . Sexual activity: Yes    Partners: Male  Other Topics Concern  . Not on file  Social History Narrative  . Not on file   Social Determinants of Health   Financial Resource Strain: Not on file  Food Insecurity: Not on file  Transportation Needs: Not on file  Physical Activity: Not on file  Stress: Not on file  Social Connections: Not on file  Intimate Partner Violence: Not on file    Outpatient Medications Prior to Visit   Medication Sig Dispense Refill  . ALPRAZolam (XANAX) 0.5 MG tablet Take 1 tablet (0.5 mg total) by mouth at bedtime as needed. 30 tablet 0  . Insulin Syringes, Disposable, U-100 0.3 ML MISC 1 each by Does not apply route at bedtime. 100 each 11  . albuterol (PROVENTIL HFA;VENTOLIN HFA) 108 (90 Base) MCG/ACT inhaler Inhale 2 puffs into the lungs every 6 (six) hours as needed for wheezing or shortness of breath. 1 Inhaler 6  . aspirin 325 MG EC tablet Take 1 tablet (325 mg total) by mouth daily. 30 tablet 3  . atorvastatin (LIPITOR) 10 MG tablet Take 1 tablet by mouth once daily 90 tablet 0  . busPIRone (BUSPAR) 10 MG tablet Take 1 tablet (10 mg total) by mouth 3 (three) times daily. 60 tablet 6  . furosemide (LASIX) 20 MG tablet Take 1 tablet (20 mg total) by mouth 2 (two) times daily as needed. 60 tablet 0  . glipiZIDE (GLUCOTROL) 10 MG tablet Take 1 tablet (10 mg total) by mouth 2 (two) times daily before a meal. 180 tablet 3  . Insulin Pen Needle (BD ULTRA-FINE PEN NEEDLES) 29G X 12.7MM MISC ICD10 E11.9 for use with insulin administration 200 each 2  . lisinopril (ZESTRIL) 40 MG tablet Take 1 tablet (40 mg total) by mouth daily. 30 tablet 0  . metFORMIN (GLUCOPHAGE) 1000 MG tablet TAKE 1 TABLET BY MOUTH TWICE DAILY WITH A MEAL 60 tablet 0  . omeprazole (PRILOSEC) 40 MG capsule Take 1 capsule (40 mg total) by mouth daily. 30 capsule 0  . thiamine 100 MG tablet Take 1 tablet (100 mg total) by mouth daily. 30 tablet 0  . traZODone (DESYREL) 100 MG tablet Take 1 tablet (100 mg total) by mouth at bedtime as needed for sleep. 30 tablet 3  . benzonatate (TESSALON) 100 MG capsule Take 1 capsule (100 mg total) by mouth 2 (two) times daily as needed for cough. 20 capsule 0  . insulin glargine (LANTUS) 100 UNIT/ML injection Inject 0.2 mLs (20 Units total) into the skin at bedtime. (Patient not taking: Reported on 01/01/2021) 10 mL 11   No facility-administered medications prior to visit.    No Known  Allergies  ROS Review of Systems  Constitutional: Negative.   HENT: Negative.   Eyes: Negative.   Respiratory: Negative.   Cardiovascular: Negative.   Gastrointestinal: Negative.   Endocrine: Negative.   Genitourinary: Negative.   Musculoskeletal: Positive for arthralgias (generalized).  Skin: Negative.   Allergic/Immunologic: Negative.   Neurological: Positive for dizziness (occasional ) and headaches (occasional ).  Hematological: Negative.   Psychiatric/Behavioral: Negative.    Objective:    Physical Exam Vitals and nursing note reviewed.  Constitutional:      Appearance: Normal appearance.  HENT:     Head: Normocephalic and atraumatic.     Nose: Nose normal.     Mouth/Throat:     Mouth:  Mucous membranes are moist.     Pharynx: Oropharynx is clear.  Cardiovascular:     Rate and Rhythm: Normal rate and regular rhythm.     Pulses: Normal pulses.     Heart sounds: Normal heart sounds.  Pulmonary:     Effort: Pulmonary effort is normal.     Breath sounds: Normal breath sounds.  Abdominal:     General: Bowel sounds are normal.     Palpations: Abdomen is soft.  Musculoskeletal:        General: Normal range of motion.     Cervical back: Normal range of motion and neck supple.  Skin:    General: Skin is warm and dry.  Neurological:     General: No focal deficit present.     Mental Status: She is alert and oriented to person, place, and time.  Psychiatric:        Mood and Affect: Mood normal.        Behavior: Behavior normal.        Thought Content: Thought content normal.        Judgment: Judgment normal.    BP 138/76 (BP Location: Left Arm, Patient Position: Sitting, Cuff Size: Normal)   Pulse 63   Temp (!) 96.6 F (35.9 C) (Temporal)   Ht  (1.626 m)   Wt 161 lb (73 kg)   SpO2 99%   BMI 27.64 kg/m  Wt Readings from Last 3 Encounters:  01/01/21 161 lb (73 kg)  08/09/20 163 lb 12.8 oz (74.3 kg)  04/07/20 157 lb 12.8 oz (71.6 kg)   Health  Maintenance Due  Topic Date Due  . OPHTHALMOLOGY EXAM  Never done  . COLONOSCOPY (Pts 45-73yrs Insurance coverage will need to be confirmed)  Never done  . FOOT EXAM  01/31/2019  . PAP SMEAR-Modifier  10/15/2019  . MAMMOGRAM  Never done  . COVID-19 Vaccine (3 - Booster for Pfizer series) 08/11/2020    There are no preventive care reminders to display for this patient.  Lab Results  Component Value Date   TSH 0.980 01/01/2021   Lab Results  Component Value Date   WBC 4.6 01/01/2021   HGB 11.0 (L) 01/01/2021   HCT 32.9 (L) 01/01/2021   MCV 86 01/01/2021   PLT 217 01/01/2021   Lab Results  Component Value Date   NA 137 01/01/2021   K 4.4 01/01/2021   CO2 20 01/01/2021   GLUCOSE 305 (H) 01/01/2021   BUN 12 01/01/2021   CREATININE 0.76 01/01/2021   BILITOT <0.2 01/01/2021   ALKPHOS 83 01/01/2021   AST 31 01/01/2021   ALT 30 01/01/2021   PROT 6.2 01/01/2021   ALBUMIN 3.7 (L) 01/01/2021   CALCIUM 8.8 01/01/2021   ANIONGAP 7 12/16/2017   Lab Results  Component Value Date   CHOL 120 01/01/2021   Lab Results  Component Value Date   HDL 34 (L) 01/01/2021   Lab Results  Component Value Date   LDLCALC 67 01/01/2021   Lab Results  Component Value Date   TRIG 99 01/01/2021   Lab Results  Component Value Date   CHOLHDL 3.5 01/01/2021   Lab Results  Component Value Date   HGBA1C 11.5 (A) 01/01/2021   HGBA1C 11.5 01/01/2021   HGBA1C 11.5 (A) 01/01/2021   HGBA1C 11.5 (A) 01/01/2021   Assessment & Plan:   1. Type 2 diabetes mellitus without complication, without long-term current use of insulin (HCC) She will continue medication as prescribed, to decrease  foods/beverages high in sugars and carbs and follow Heart Healthy or DASH diet. Increase physical activity to at least 30 minutes cardio exercise daily.  - POCT Urinalysis Dipstick - HgB A1c - atorvastatin (LIPITOR) 10 MG tablet; Take 1 tablet (10 mg total) by mouth daily.  Dispense: 30 tablet; Refill: 11 -  glipiZIDE (GLUCOTROL) 10 MG tablet; Take 1 tablet (10 mg total) by mouth 2 (two) times daily before a meal.  Dispense: 180 tablet; Refill: 3 - insulin glargine (LANTUS) 100 UNIT/ML injection; Inject 0.2 mLs (20 Units total) into the skin at bedtime.  Dispense: 10 mL; Refill: 11 - metFORMIN (GLUCOPHAGE) 1000 MG tablet; Take 1 tablet (1,000 mg total) by mouth 2 (two) times daily with a meal.  Dispense: 60 tablet; Refill: 11  2. Hemoglobin A1C greater than 9%, indicating poor diabetic control Worsened. Hgb A1c increased at 11.5 today, from 9.3 on 08/09/2020. Monitor.   3. Hyperglycemia  4. Shortness of breath Stable. No signs or symptoms of respiratory distress noted or reported today.  - albuterol (VENTOLIN HFA) 108 (90 Base) MCG/ACT inhaler; Inhale 2 puffs into the lungs every 6 (six) hours as needed for wheezing or shortness of breath.  Dispense: 1 each; Refill: 11  5. Cough - albuterol (VENTOLIN HFA) 108 (90 Base) MCG/ACT inhaler; Inhale 2 puffs into the lungs every 6 (six) hours as needed for wheezing or shortness of breath.  Dispense: 1 each; Refill: 11  6. Anxiety Stable.  - busPIRone (BUSPAR) 10 MG tablet; Take 1 tablet (10 mg total) by mouth 3 (three) times daily.  Dispense: 60 tablet; Refill: 11  7. Bilateral lower extremity edema - furosemide (LASIX) 20 MG tablet; Take 1 tablet (20 mg total) by mouth 2 (two) times daily as needed.  Dispense: 60 tablet; Refill: 11  8. Essential hypertension The current medical regimen is effective; blood pressure is stable at 138/76 today; continue present plan and medications as prescribed. She will continue to take medications as prescribed, to decrease high sodium intake, excessive alcohol intake, increase potassium intake, smoking cessation, and increase physical activity of at least 30 minutes of cardio activity daily. She will continue to follow Heart Healthy or DASH diet. - lisinopril (ZESTRIL) 40 MG tablet; Take 1 tablet (40 mg total) by mouth  daily.  Dispense: 30 tablet; Refill: 11  9. Gastroesophageal reflux disease without esophagitis - omeprazole (PRILOSEC) 40 MG capsule; Take 1 capsule (40 mg total) by mouth daily.  Dispense: 30 capsule; Refill: 11  10. Insomnia, unspecified type - traZODone (DESYREL) 100 MG tablet; Take 1 tablet (100 mg total) by mouth at bedtime as needed for sleep.  Dispense: 30 tablet; Refill: 11  11. Medication refill - albuterol (VENTOLIN HFA) 108 (90 Base) MCG/ACT inhaler; Inhale 2 puffs into the lungs every 6 (six) hours as needed for wheezing or shortness of breath.  Dispense: 1 each; Refill: 11 - aspirin 325 MG EC tablet; Take 1 tablet (325 mg total) by mouth daily.  Dispense: 30 tablet; Refill: 11 - atorvastatin (LIPITOR) 10 MG tablet; Take 1 tablet (10 mg total) by mouth daily.  Dispense: 30 tablet; Refill: 11 - busPIRone (BUSPAR) 10 MG tablet; Take 1 tablet (10 mg total) by mouth 3 (three) times daily.  Dispense: 60 tablet; Refill: 11 - furosemide (LASIX) 20 MG tablet; Take 1 tablet (20 mg total) by mouth 2 (two) times daily as needed.  Dispense: 60 tablet; Refill: 11 - glipiZIDE (GLUCOTROL) 10 MG tablet; Take 1 tablet (10 mg total) by mouth 2 (  two) times daily before a meal.  Dispense: 180 tablet; Refill: 3 - Insulin Pen Needle (BD ULTRA-FINE PEN NEEDLES) 29G X 12.7MM MISC; ICD10 E11.9 for use with insulin administration  Dispense: 200 each; Refill: 11 - lisinopril (ZESTRIL) 40 MG tablet; Take 1 tablet (40 mg total) by mouth daily.  Dispense: 30 tablet; Refill: 11 - metFORMIN (GLUCOPHAGE) 1000 MG tablet; Take 1 tablet (1,000 mg total) by mouth 2 (two) times daily with a meal.  Dispense: 60 tablet; Refill: 11 - omeprazole (PRILOSEC) 40 MG capsule; Take 1 capsule (40 mg total) by mouth daily.  Dispense: 30 capsule; Refill: 11 - thiamine 100 MG tablet; Take 1 tablet (100 mg total) by mouth daily.  Dispense: 30 tablet; Refill: 11 - traZODone (DESYREL) 100 MG tablet; Take 1 tablet (100 mg total) by mouth  at bedtime as needed for sleep.  Dispense: 30 tablet; Refill: 11  12. Healthcare maintenance - CBC with Differential - Comprehensive metabolic panel - TSH - Lipid Panel - Vitamin B12 - Vitamin D, 25-hydroxy  13. Follow up She will follow up in 3 months.   Meds ordered this encounter  Medications  . DISCONTD: albuterol (VENTOLIN HFA) 108 (90 Base) MCG/ACT inhaler    Sig: Inhale 2 puffs into the lungs every 6 (six) hours as needed for wheezing or shortness of breath.    Dispense:  1 each    Refill:  11  . DISCONTD: aspirin 325 MG EC tablet    Sig: Take 1 tablet (325 mg total) by mouth daily.    Dispense:  30 tablet    Refill:  11  . DISCONTD: atorvastatin (LIPITOR) 10 MG tablet    Sig: Take 1 tablet (10 mg total) by mouth daily.    Dispense:  30 tablet    Refill:  11  . DISCONTD: busPIRone (BUSPAR) 10 MG tablet    Sig: Take 1 tablet (10 mg total) by mouth 3 (three) times daily.    Dispense:  60 tablet    Refill:  11  . DISCONTD: furosemide (LASIX) 20 MG tablet    Sig: Take 1 tablet (20 mg total) by mouth 2 (two) times daily as needed.    Dispense:  60 tablet    Refill:  11  . DISCONTD: glipiZIDE (GLUCOTROL) 10 MG tablet    Sig: Take 1 tablet (10 mg total) by mouth 2 (two) times daily before a meal.    Dispense:  180 tablet    Refill:  3  . DISCONTD: Insulin Pen Needle (BD ULTRA-FINE PEN NEEDLES) 29G X 12.7MM MISC    Sig: ICD10 E11.9 for use with insulin administration    Dispense:  200 each    Refill:  11  . DISCONTD: lisinopril (ZESTRIL) 40 MG tablet    Sig: Take 1 tablet (40 mg total) by mouth daily.    Dispense:  30 tablet    Refill:  11  . DISCONTD: metFORMIN (GLUCOPHAGE) 1000 MG tablet    Sig: Take 1 tablet (1,000 mg total) by mouth 2 (two) times daily with a meal.    Dispense:  60 tablet    Refill:  11  . DISCONTD: omeprazole (PRILOSEC) 40 MG capsule    Sig: Take 1 capsule (40 mg total) by mouth daily.    Dispense:  30 capsule    Refill:  11  . DISCONTD:  thiamine 100 MG tablet    Sig: Take 1 tablet (100 mg total) by mouth daily.    Dispense:  30 tablet  Refill:  11  . DISCONTD: traZODone (DESYREL) 100 MG tablet    Sig: Take 1 tablet (100 mg total) by mouth at bedtime as needed for sleep.    Dispense:  30 tablet    Refill:  11  . DISCONTD: insulin glargine (LANTUS) 100 UNIT/ML injection    Sig: Inject 0.2 mLs (20 Units total) into the skin at bedtime.    Dispense:  10 mL    Refill:  11  . albuterol (VENTOLIN HFA) 108 (90 Base) MCG/ACT inhaler    Sig: Inhale 2 puffs into the lungs every 6 (six) hours as needed for wheezing or shortness of breath.    Dispense:  1 each    Refill:  11  . aspirin 325 MG EC tablet    Sig: Take 1 tablet (325 mg total) by mouth daily.    Dispense:  30 tablet    Refill:  11  . atorvastatin (LIPITOR) 10 MG tablet    Sig: Take 1 tablet (10 mg total) by mouth daily.    Dispense:  30 tablet    Refill:  11  . busPIRone (BUSPAR) 10 MG tablet    Sig: Take 1 tablet (10 mg total) by mouth 3 (three) times daily.    Dispense:  60 tablet    Refill:  11  . furosemide (LASIX) 20 MG tablet    Sig: Take 1 tablet (20 mg total) by mouth 2 (two) times daily as needed.    Dispense:  60 tablet    Refill:  11  . glipiZIDE (GLUCOTROL) 10 MG tablet    Sig: Take 1 tablet (10 mg total) by mouth 2 (two) times daily before a meal.    Dispense:  180 tablet    Refill:  3  . insulin glargine (LANTUS) 100 UNIT/ML injection    Sig: Inject 0.2 mLs (20 Units total) into the skin at bedtime.    Dispense:  10 mL    Refill:  11  . Insulin Pen Needle (BD ULTRA-FINE PEN NEEDLES) 29G X 12.7MM MISC    Sig: ICD10 E11.9 for use with insulin administration    Dispense:  200 each    Refill:  11  . lisinopril (ZESTRIL) 40 MG tablet    Sig: Take 1 tablet (40 mg total) by mouth daily.    Dispense:  30 tablet    Refill:  11  . metFORMIN (GLUCOPHAGE) 1000 MG tablet    Sig: Take 1 tablet (1,000 mg total) by mouth 2 (two) times daily with a  meal.    Dispense:  60 tablet    Refill:  11  . omeprazole (PRILOSEC) 40 MG capsule    Sig: Take 1 capsule (40 mg total) by mouth daily.    Dispense:  30 capsule    Refill:  11  . thiamine 100 MG tablet    Sig: Take 1 tablet (100 mg total) by mouth daily.    Dispense:  30 tablet    Refill:  11  . traZODone (DESYREL) 100 MG tablet    Sig: Take 1 tablet (100 mg total) by mouth at bedtime as needed for sleep.    Dispense:  30 tablet    Refill:  11    Orders Placed This Encounter  Procedures  . CBC with Differential  . Comprehensive metabolic panel  . TSH  . Lipid Panel  . Vitamin B12  . Vitamin D, 25-hydroxy  . POCT Urinalysis Dipstick  . HgB A1c   Referral Orders  No referral(s) requested today    Raliegh IpNatalie Emslee Lopezmartinez, MSN, ANE, FNP-BC [facility] [address]  [place], [place] [id number] [phone]    Problem List Items Addressed This Visit      Cardiovascular and Mediastinum   Essential hypertension   Relevant Medications   aspirin 325 MG EC tablet   atorvastatin (LIPITOR) 10 MG tablet   furosemide (LASIX) 20 MG tablet   lisinopril (ZESTRIL) 40 MG tablet     Endocrine   Type 2 diabetes mellitus without complication, without long-term current use of insulin (HCC) - Primary   Relevant Medications   aspirin 325 MG EC tablet   atorvastatin (LIPITOR) 10 MG tablet   glipiZIDE (GLUCOTROL) 10 MG tablet   insulin glargine (LANTUS) 100 UNIT/ML injection   lisinopril (ZESTRIL) 40 MG tablet   metFORMIN (GLUCOPHAGE) 1000 MG tablet   Other Relevant Orders   POCT Urinalysis Dipstick (Completed)   HgB A1c (Completed)     Other   Anxiety   Relevant Medications   busPIRone (BUSPAR) 10 MG tablet   traZODone (DESYREL) 100 MG tablet   Bilateral lower extremity edema   Relevant Medications   furosemide (LASIX) 20 MG tablet   Hyperglycemia   Insomnia   Relevant  Medications   traZODone (DESYREL) 100 MG tablet   Shortness of breath   Relevant Medications   albuterol (VENTOLIN HFA) 108 (90 Base) MCG/ACT inhaler    Other Visit Diagnoses    Hemoglobin A1C greater than 9%, indicating poor diabetic control       Relevant Medications   aspirin 325 MG EC tablet   atorvastatin (LIPITOR) 10 MG tablet   glipiZIDE (GLUCOTROL) 10 MG tablet   insulin glargine (LANTUS) 100 UNIT/ML injection   lisinopril (ZESTRIL) 40 MG tablet   metFORMIN (GLUCOPHAGE) 1000 MG tablet   Cough       Relevant Medications   albuterol (VENTOLIN HFA) 108 (90 Base) MCG/ACT inhaler   Gastroesophageal reflux disease without esophagitis       Relevant Medications   omeprazole (PRILOSEC) 40 MG capsule   Medication refill       Relevant Medications   albuterol (VENTOLIN HFA) 108 (90 Base) MCG/ACT inhaler   aspirin 325 MG EC tablet   atorvastatin (LIPITOR) 10 MG tablet   busPIRone (BUSPAR) 10 MG tablet   furosemide (LASIX) 20 MG tablet   glipiZIDE (GLUCOTROL) 10 MG tablet   Insulin Pen Needle (BD ULTRA-FINE PEN NEEDLES) 29G X 12.7MM MISC   lisinopril (ZESTRIL) 40 MG tablet   metFORMIN (GLUCOPHAGE) 1000 MG tablet   omeprazole (PRILOSEC) 40 MG capsule   thiamine 100 MG tablet   traZODone (DESYREL) 100 MG tablet   Healthcare maintenance       Relevant Orders   CBC with Differential (Completed)   Comprehensive metabolic panel (Completed)   TSH (Completed)   Lipid Panel (Completed)   Vitamin B12 (Completed)   Vitamin D, 25-hydroxy (Completed)   Follow up          Meds ordered this encounter  Medications  . DISCONTD: albuterol (VENTOLIN HFA) 108 (90 Base) MCG/ACT inhaler    Sig: Inhale 2 puffs into the lungs every 6 (six) hours as needed for wheezing or shortness of breath.    Dispense:  1 each    Refill:  11  . DISCONTD: aspirin 325 MG EC tablet    Sig: Take 1 tablet (325 mg total) by mouth daily.  Dispense:  30 tablet    Refill:  11  . DISCONTD: atorvastatin  (LIPITOR) 10 MG tablet    Sig: Take 1 tablet (10 mg total) by mouth daily.    Dispense:  30 tablet    Refill:  11  . DISCONTD: busPIRone (BUSPAR) 10 MG tablet    Sig: Take 1 tablet (10 mg total) by mouth 3 (three) times daily.    Dispense:  60 tablet    Refill:  11  . DISCONTD: furosemide (LASIX) 20 MG tablet    Sig: Take 1 tablet (20 mg total) by mouth 2 (two) times daily as needed.    Dispense:  60 tablet    Refill:  11  . DISCONTD: glipiZIDE (GLUCOTROL) 10 MG tablet    Sig: Take 1 tablet (10 mg total) by mouth 2 (two) times daily before a meal.    Dispense:  180 tablet    Refill:  3  . DISCONTD: Insulin Pen Needle (BD ULTRA-FINE PEN NEEDLES) 29G X 12.7MM MISC    Sig: ICD10 E11.9 for use with insulin administration    Dispense:  200 each    Refill:  11  . DISCONTD: lisinopril (ZESTRIL) 40 MG tablet    Sig: Take 1 tablet (40 mg total) by mouth daily.    Dispense:  30 tablet    Refill:  11  . DISCONTD: metFORMIN (GLUCOPHAGE) 1000 MG tablet    Sig: Take 1 tablet (1,000 mg total) by mouth 2 (two) times daily with a meal.    Dispense:  60 tablet    Refill:  11  . DISCONTD: omeprazole (PRILOSEC) 40 MG capsule    Sig: Take 1 capsule (40 mg total) by mouth daily.    Dispense:  30 capsule    Refill:  11  . DISCONTD: thiamine 100 MG tablet    Sig: Take 1 tablet (100 mg total) by mouth daily.    Dispense:  30 tablet    Refill:  11  . DISCONTD: traZODone (DESYREL) 100 MG tablet    Sig: Take 1 tablet (100 mg total) by mouth at bedtime as needed for sleep.    Dispense:  30 tablet    Refill:  11  . DISCONTD: insulin glargine (LANTUS) 100 UNIT/ML injection    Sig: Inject 0.2 mLs (20 Units total) into the skin at bedtime.    Dispense:  10 mL    Refill:  11  . albuterol (VENTOLIN HFA) 108 (90 Base) MCG/ACT inhaler    Sig: Inhale 2 puffs into the lungs every 6 (six) hours as needed for wheezing or shortness of breath.    Dispense:  1 each    Refill:  11  . aspirin 325 MG EC tablet     Sig: Take 1 tablet (325 mg total) by mouth daily.    Dispense:  30 tablet    Refill:  11  . atorvastatin (LIPITOR) 10 MG tablet    Sig: Take 1 tablet (10 mg total) by mouth daily.    Dispense:  30 tablet    Refill:  11  . busPIRone (BUSPAR) 10 MG tablet    Sig: Take 1 tablet (10 mg total) by mouth 3 (three) times daily.    Dispense:  60 tablet    Refill:  11  . furosemide (LASIX) 20 MG tablet    Sig: Take 1 tablet (20 mg total) by mouth 2 (two) times daily as needed.    Dispense:  60 tablet    Refill:  11  . glipiZIDE (GLUCOTROL) 10 MG tablet    Sig: Take 1 tablet (10 mg total) by mouth 2 (two) times daily before a meal.    Dispense:  180 tablet    Refill:  3  . insulin glargine (LANTUS) 100 UNIT/ML injection    Sig: Inject 0.2 mLs (20 Units total) into the skin at bedtime.    Dispense:  10 mL    Refill:  11  . Insulin Pen Needle (BD ULTRA-FINE PEN NEEDLES) 29G X 12.7MM MISC    Sig: ICD10 E11.9 for use with insulin administration    Dispense:  200 each    Refill:  11  . lisinopril (ZESTRIL) 40 MG tablet    Sig: Take 1 tablet (40 mg total) by mouth daily.    Dispense:  30 tablet    Refill:  11  . metFORMIN (GLUCOPHAGE) 1000 MG tablet    Sig: Take 1 tablet (1,000 mg total) by mouth 2 (two) times daily with a meal.    Dispense:  60 tablet    Refill:  11  . omeprazole (PRILOSEC) 40 MG capsule    Sig: Take 1 capsule (40 mg total) by mouth daily.    Dispense:  30 capsule    Refill:  11  . thiamine 100 MG tablet    Sig: Take 1 tablet (100 mg total) by mouth daily.    Dispense:  30 tablet    Refill:  11  . traZODone (DESYREL) 100 MG tablet    Sig: Take 1 tablet (100 mg total) by mouth at bedtime as needed for sleep.    Dispense:  30 tablet    Refill:  11    Follow-up: No follow-ups on file.    Kallie Locks, FNP

## 2021-01-02 LAB — CBC WITH DIFFERENTIAL/PLATELET
Basophils Absolute: 0 10*3/uL (ref 0.0–0.2)
Basos: 1 %
EOS (ABSOLUTE): 0.1 10*3/uL (ref 0.0–0.4)
Eos: 2 %
Hematocrit: 32.9 % — ABNORMAL LOW (ref 34.0–46.6)
Hemoglobin: 11 g/dL — ABNORMAL LOW (ref 11.1–15.9)
Immature Grans (Abs): 0 10*3/uL (ref 0.0–0.1)
Immature Granulocytes: 0 %
Lymphocytes Absolute: 1.4 10*3/uL (ref 0.7–3.1)
Lymphs: 30 %
MCH: 28.6 pg (ref 26.6–33.0)
MCHC: 33.4 g/dL (ref 31.5–35.7)
MCV: 86 fL (ref 79–97)
Monocytes Absolute: 0.3 10*3/uL (ref 0.1–0.9)
Monocytes: 6 %
Neutrophils Absolute: 2.8 10*3/uL (ref 1.4–7.0)
Neutrophils: 61 %
Platelets: 217 10*3/uL (ref 150–450)
RBC: 3.84 x10E6/uL (ref 3.77–5.28)
RDW: 15.2 % (ref 11.7–15.4)
WBC: 4.6 10*3/uL (ref 3.4–10.8)

## 2021-01-02 LAB — LIPID PANEL
Chol/HDL Ratio: 3.5 ratio (ref 0.0–4.4)
Cholesterol, Total: 120 mg/dL (ref 100–199)
HDL: 34 mg/dL — ABNORMAL LOW (ref >39)
LDL Chol Calc (NIH): 67 mg/dL (ref 0–99)
Triglycerides: 99 mg/dL (ref 0–149)
VLDL Cholesterol Cal: 19 mg/dL (ref 5–40)

## 2021-01-02 LAB — COMPREHENSIVE METABOLIC PANEL
ALT: 30 IU/L (ref 0–32)
AST: 31 IU/L (ref 0–40)
Albumin/Globulin Ratio: 1.5 (ref 1.2–2.2)
Albumin: 3.7 g/dL — ABNORMAL LOW (ref 3.8–4.9)
Alkaline Phosphatase: 83 IU/L (ref 44–121)
BUN/Creatinine Ratio: 16 (ref 9–23)
BUN: 12 mg/dL (ref 6–24)
Bilirubin Total: <0.2 mg/dL (ref 0.0–1.2)
CO2: 20 mmol/L (ref 20–29)
Calcium: 8.8 mg/dL (ref 8.7–10.2)
Chloride: 100 mmol/L (ref 96–106)
Creatinine, Ser: 0.76 mg/dL (ref 0.57–1.00)
Globulin, Total: 2.5 g/dL (ref 1.5–4.5)
Glucose: 305 mg/dL — ABNORMAL HIGH (ref 65–99)
Potassium: 4.4 mmol/L (ref 3.5–5.2)
Sodium: 137 mmol/L (ref 134–144)
Total Protein: 6.2 g/dL (ref 6.0–8.5)
eGFR: 95 mL/min/{1.73_m2} (ref >59)

## 2021-01-02 LAB — VITAMIN B12: Vitamin B-12: 318 pg/mL (ref 232–1245)

## 2021-01-02 LAB — VITAMIN D 25 HYDROXY (VIT D DEFICIENCY, FRACTURES): Vit D, 25-Hydroxy: 11.4 ng/mL — ABNORMAL LOW (ref 30.0–100.0)

## 2021-01-02 LAB — TSH: TSH: 0.98 u[IU]/mL (ref 0.450–4.500)

## 2021-01-03 ENCOUNTER — Other Ambulatory Visit: Payer: Self-pay | Admitting: Family Medicine

## 2021-01-03 ENCOUNTER — Encounter: Payer: Self-pay | Admitting: Family Medicine

## 2021-01-03 DIAGNOSIS — E559 Vitamin D deficiency, unspecified: Secondary | ICD-10-CM

## 2021-01-03 MED ORDER — VITAMIN D (ERGOCALCIFEROL) 1.25 MG (50000 UNIT) PO CAPS: 50000.0000 [IU] | ORAL_CAPSULE | ORAL | 3 refills | Status: DC

## 2021-04-10 ENCOUNTER — Other Ambulatory Visit: Payer: Self-pay

## 2021-06-29 ENCOUNTER — Encounter: Payer: Self-pay | Admitting: Nurse Practitioner

## 2021-06-29 ENCOUNTER — Ambulatory Visit (INDEPENDENT_AMBULATORY_CARE_PROVIDER_SITE_OTHER): Payer: Self-pay | Admitting: Nurse Practitioner

## 2021-06-29 ENCOUNTER — Other Ambulatory Visit: Payer: Self-pay

## 2021-06-29 VITALS — BP 163/85 | HR 91 | Temp 97.4°F | Ht 64.0 in | Wt 154.0 lb

## 2021-06-29 DIAGNOSIS — Z0000 Encounter for general adult medical examination without abnormal findings: Secondary | ICD-10-CM

## 2021-06-29 DIAGNOSIS — I10 Essential (primary) hypertension: Secondary | ICD-10-CM

## 2021-06-29 DIAGNOSIS — Z720 Tobacco use: Secondary | ICD-10-CM

## 2021-06-29 DIAGNOSIS — Z77120 Contact with and (suspected) exposure to mold (toxic): Secondary | ICD-10-CM

## 2021-06-29 DIAGNOSIS — Z23 Encounter for immunization: Secondary | ICD-10-CM

## 2021-06-29 DIAGNOSIS — E119 Type 2 diabetes mellitus without complications: Secondary | ICD-10-CM

## 2021-06-29 DIAGNOSIS — R0602 Shortness of breath: Secondary | ICD-10-CM

## 2021-06-29 LAB — GLUCOSE, POCT (MANUAL RESULT ENTRY): POC Glucose: 150 mg/dl — AB (ref 70–99)

## 2021-06-29 LAB — POCT URINALYSIS DIP (CLINITEK)
Bilirubin, UA: NEGATIVE
Blood, UA: NEGATIVE
Glucose, UA: NEGATIVE mg/dL
Ketones, POC UA: NEGATIVE mg/dL
Leukocytes, UA: NEGATIVE
Nitrite, UA: NEGATIVE
POC PROTEIN,UA: NEGATIVE
Spec Grav, UA: 1.01 (ref 1.010–1.025)
Urobilinogen, UA: 0.2 E.U./dL
pH, UA: 5.5 (ref 5.0–8.0)

## 2021-06-29 LAB — POCT GLYCOSYLATED HEMOGLOBIN (HGB A1C)
HbA1c POC (<> result, manual entry): 11.7 % (ref 4.0–5.6)
HbA1c, POC (controlled diabetic range): 11.7 % — AB (ref 0.0–7.0)
HbA1c, POC (prediabetic range): 11.7 % — AB (ref 5.7–6.4)
Hemoglobin A1C: 11.7 % — AB (ref 4.0–5.6)

## 2021-06-29 MED ORDER — NICOTINE 14 MG/24HR TD PT24: 14.0000 mg | MEDICATED_PATCH | Freq: Every day | TRANSDERMAL | 0 refills | Status: DC

## 2021-06-29 MED ORDER — INSULIN GLARGINE 100 UNIT/ML ~~LOC~~ SOLN: 40.0000 [IU] | Freq: Every day | SUBCUTANEOUS | 3 refills | Status: DC

## 2021-06-29 MED ORDER — INSULIN GLARGINE 100 UNIT/ML ~~LOC~~ SOLN: 24.0000 [IU] | Freq: Every day | SUBCUTANEOUS | 3 refills | Status: DC

## 2021-06-29 MED ORDER — AMLODIPINE BESYLATE 5 MG PO TABS: 5.0000 mg | ORAL_TABLET | Freq: Every day | ORAL | 0 refills | Status: DC

## 2021-06-29 MED ORDER — FLUTICASONE-SALMETEROL 100-50 MCG/ACT IN AEPB: 1.0000 | INHALATION_SPRAY | Freq: Two times a day (BID) | RESPIRATORY_TRACT | 3 refills | Status: DC

## 2021-06-29 NOTE — Patient Instructions (Addendum)
You were seen today in the [facility] for evaluation for mold exposure and reevaluation of diabetes and hypertension. Labs were collected, results will be available via MyChart or, if abnormal, you will be contacted by clinic staff. You were prescribed medications, please take as directed. Please follow up in 1 mth for reevaluation.    Eye Doctors That Accept Medicaid and/or [facility]  [facility] [address], [address]  [place], [place] [address] [phone] [email or web address]   [facility] [address] [place], [place] [address] [phone] [email or web address]   [facility] [place] [address] [place], [place] [address] Located next to [employer] Phone: [phone] [facility], [facility] [address] [place], [place] [address] Located next to [employer] Phone: [phone] [email or web address]   [facility] [address], [address] [place], [place] [address] [phone] [email or web address]   [facility] [address] [place], [place] [address] [phone] [email or web address]   [facility] [address]  [place], [place] [address] [phone] [email or web address]

## 2021-06-29 NOTE — Progress Notes (Signed)
 [facility] [address] [place], [place]  [address] Phone:  [phone]   Fax:  [phone] Subjective:   Patient ID: Misty Mccullough, female    DOB: 03/16/1971, 50 y.o.   MRN: [id number]  Chief Complaint  Patient presents with   Follow-up    Coughing, wheezing going on for 8 months. Living in a home with mold, 3 years.    HPI Misty Mccullough 50 y.o. female with extensive medical history including hypertension, CVA, diabetes, chest pain, hyperlipidemia, tobacco use and dependence, insomnia, shortness of breath and anxiety to the [facility] for cough and wheezing.  Patient states she has had worsening cough and wheezing for the past 8 months.  Suspects it may be related to mold exposure.  States that she and her family moved into a home with mold 3 years ago.  Was informed by her landlord that the mold that was present but not cause health issues.  For the past 8 months she has had worsening productive cough with clear sputum.  States that the sputum smells like Clorox bleach.  Also has an increasing shortness of breath, unable to walk long distances without resting.  Denies any chest pain.  States that she is the only family member in the home that has had symptoms.  Denies any fever, denies any headache or dizziness.  Has been using her albuterol inhaler with no improvement in symptoms.  Currently smokes 1 pack/day x 3 years.  Denies being employed at this time.  States that she has been on disability for several years.  Denies adhering to any diet or exercise regimen.  Checks blood sugar every other day, last fasting blood sugar 260.  Checks BP at home regularly, systolic results typically between 155 and 165. Currently compliant with all medications. Denies any recent trauma or injury. Denies any other complaints today. Requesting testing for mold.  Past Medical History:  Diagnosis Date   Bilateral lower extremity edema    Cough    Diabetes mellitus without complication (HCC)     Gallstones    Heavy cigarette smoker    Hemoglobin A1C between 7% and 9% indicating borderline diabetic control 08/2019   Hypercholesteremia    Hyperglycemia    Hypertension    Shortness of breath    Vitamin D deficiency 12/2020    Past Surgical History:  Procedure Laterality Date   TUBAL LIGATION      Family History  Problem Relation Age of Onset   Diabetes Mother    Uterine cancer Mother    Diabetes Father    Heart attack Father 62   Stroke Father    Heart attack Paternal Grandmother    Heart attack Paternal Grandfather    Stroke Sister    Heart attack Paternal Uncle    Bone cancer Paternal Uncle     Social History   Socioeconomic History   Marital status: Married    Spouse name: Not on file   Number of children: Not on file   Years of education: Not on file   Highest education level: Not on file  Occupational History   Not on file  Tobacco Use   Smoking status: Every Day    Packs/day: 1.00    Types: Cigarettes   Smokeless tobacco: Never   Tobacco comments:    Since age 14.  Vaping Use   Vaping Use: Never used  Substance and Sexual Activity   Alcohol use: Not Currently    Comment: Occasional     Drug use: No   Sexual activity: Yes    Partners: Male  Other Topics Concern   Not on file  Social History Narrative   Not on file   Social Determinants of Health   Financial Resource Strain: Not on file  Food Insecurity: Not on file  Transportation Needs: Not on file  Physical Activity: Not on file  Stress: Not on file  Social Connections: Not on file  Intimate Partner Violence: Not on file    Outpatient Medications Prior to Visit  Medication Sig Dispense Refill   albuterol (VENTOLIN HFA) 108 (90 Base) MCG/ACT inhaler Inhale 2 puffs into the lungs every 6 (six) hours as needed for wheezing or shortness of breath. 1 each 11   ALPRAZolam (XANAX) 0.5 MG tablet Take 1 tablet (0.5 mg total) by mouth at bedtime as needed. 30 tablet 0   aspirin 325 MG EC  tablet Take 1 tablet (325 mg total) by mouth daily. 30 tablet 11   atorvastatin (LIPITOR) 10 MG tablet TAKE 1 TABLET (10 MG TOTAL) BY MOUTH DAILY. 30 tablet 11   busPIRone (BUSPAR) 10 MG tablet TAKE 1 TABLET (10 MG TOTAL) BY MOUTH 3 (THREE) TIMES DAILY. 60 tablet 11   furosemide (LASIX) 20 MG tablet TAKE 1 TABLET (20 MG TOTAL) BY MOUTH 2 (TWO) TIMES DAILY AS NEEDED. 60 tablet 11   glipiZIDE (GLUCOTROL) 10 MG tablet TAKE 1 TABLET (10 MG TOTAL) BY MOUTH 2 (TWO) TIMES DAILY BEFORE A MEAL. 180 tablet 3   Insulin Pen Needle (BD ULTRA-FINE PEN NEEDLES) 29G X 12.7MM MISC ICD10 E11.9 for use with insulin administration 200 each 11   Insulin Pen Needle 32G X 4 MM MISC FOR USE WITH INSULIN ADMINISTRATION 200 each 11   Insulin Syringes, Disposable, U-100 0.3 ML MISC 1 each by Does not apply route at bedtime. 100 each 11   lisinopril (ZESTRIL) 40 MG tablet TAKE 1 TABLET (40 MG TOTAL) BY MOUTH DAILY. 30 tablet 11   metFORMIN (GLUCOPHAGE) 1000 MG tablet TAKE 1 TABLET (1,000 MG TOTAL) BY MOUTH 2 (TWO) TIMES DAILY WITH A MEAL. 60 tablet 11   omeprazole (PRILOSEC) 40 MG capsule TAKE 1 CAPSULE (40 MG TOTAL) BY MOUTH DAILY. 30 capsule 11   traZODone (DESYREL) 100 MG tablet TAKE 1 TABLET (100 MG TOTAL) BY MOUTH AT BEDTIME AS NEEDED FOR SLEEP. 30 tablet 11   insulin glargine (LANTUS) 100 UNIT/ML injection Inject 0.2 mLs (20 Units total) into the skin at bedtime. 10 mL 11   Vitamin D, Ergocalciferol, (DRISDOL) 1.25 MG (50000 UNIT) CAPS capsule Take 1 capsule (50,000 Units total) by mouth every 7 (seven) days. 5 capsule 3   thiamine 100 MG tablet Take 1 tablet (100 mg total) by mouth daily. (Patient not taking: Reported on 06/29/2021) 30 tablet 11   No facility-administered medications prior to visit.    No Known Allergies  Review of Systems  Constitutional:  Negative for chills, fever and malaise/fatigue.  HENT: Negative.    Eyes: Negative.   Respiratory:  Positive for cough, sputum production, shortness of breath  and wheezing. Negative for hemoptysis.   Cardiovascular:  Negative for chest pain, palpitations and leg swelling.  Gastrointestinal:  Negative for abdominal pain, blood in stool, constipation, diarrhea, nausea and vomiting.  Musculoskeletal: Negative.   Skin: Negative.   Neurological: Negative.   Psychiatric/Behavioral:  Negative for depression. The patient is not nervous/anxious.   All other systems reviewed and are negative.     Objective:    Physical   Exam Vitals reviewed.  Constitutional:      General: She is not in acute distress.    Appearance: Normal appearance.  HENT:     Head: Normocephalic.     Right Ear: Tympanic membrane, ear canal and external ear normal.     Left Ear: Tympanic membrane, ear canal and external ear normal.     Nose: Nose normal.     Mouth/Throat:     Mouth: Mucous membranes are moist.  Eyes:     Extraocular Movements: Extraocular movements intact.     Conjunctiva/sclera: Conjunctivae normal.     Pupils: Pupils are equal, round, and reactive to light.  Cardiovascular:     Rate and Rhythm: Normal rate and regular rhythm.     Pulses: Normal pulses.     Heart sounds: Normal heart sounds.     Comments: No obvious peripheral edema Pulmonary:     Effort: Pulmonary effort is normal.     Comments: Diffuse wheezing, limited air movement, reduced lung sounds throughout Abdominal:     General: Abdomen is flat. Bowel sounds are normal.     Palpations: Abdomen is soft.  Musculoskeletal:        General: Normal range of motion.     Cervical back: Normal range of motion and neck supple.  Skin:    General: Skin is warm and dry.     Capillary Refill: Capillary refill takes less than 2 seconds.  Neurological:     General: No focal deficit present.     Mental Status: She is alert and oriented to person, place, and time.  Psychiatric:        Mood and Affect: Mood normal.        Behavior: Behavior normal.        Thought Content: Thought content normal.         Judgment: Judgment normal.    BP (!) 163/85   Pulse 91   Temp (!) 97.4 F (36.3 C)   Ht 5' 4" (1.626 m)   Wt 154 lb 0.2 oz (69.9 kg)   SpO2 95%   BMI 26.44 kg/m  Wt Readings from Last 3 Encounters:  06/29/21 154 lb 0.2 oz (69.9 kg)  01/01/21 161 lb (73 kg)  08/09/20 163 lb 12.8 oz (74.3 kg)    Immunization History  Administered Date(s) Administered   Influenza Whole 06/21/2011   Influenza,inj,Quad PF,6+ Mos 09/06/2016, 08/17/2018, 08/25/2019, 06/29/2021   PFIZER Comirnaty(Gray Top)Covid-19 Tri-Sucrose Vaccine 01/15/2020, 02/09/2020   Pneumococcal Conjugate-13 08/25/2019   Pneumococcal Polysaccharide-23 12/06/2016   Pneumococcal-Unspecified 06/21/2011   Tdap 12/06/2016    Diabetic Foot Exam - Simple   No data filed     Lab Results  Component Value Date   TSH 0.980 01/01/2021   Lab Results  Component Value Date   WBC 4.6 01/01/2021   HGB 11.0 (L) 01/01/2021   HCT 32.9 (L) 01/01/2021   MCV 86 01/01/2021   PLT 217 01/01/2021   Lab Results  Component Value Date   NA 137 01/01/2021   K 4.4 01/01/2021   CO2 20 01/01/2021   GLUCOSE 305 (H) 01/01/2021   BUN 12 01/01/2021   CREATININE 0.76 01/01/2021   BILITOT <0.2 01/01/2021   ALKPHOS 83 01/01/2021   AST 31 01/01/2021   ALT 30 01/01/2021   PROT 6.2 01/01/2021   ALBUMIN 3.7 (L) 01/01/2021   CALCIUM 8.8 01/01/2021   ANIONGAP 7 12/16/2017   EGFR 95 01/01/2021   Lab Results  Component Value Date     CHOL 120 01/01/2021   CHOL 106 04/21/2019   CHOL 181 12/16/2017   Lab Results  Component Value Date   HDL 34 (L) 01/01/2021   HDL 34 (L) 04/21/2019   HDL 31 (L) 12/16/2017   Lab Results  Component Value Date   LDLCALC 67 01/01/2021   LDLCALC 60 04/21/2019   LDLCALC 133 (H) 12/16/2017   Lab Results  Component Value Date   TRIG 99 01/01/2021   TRIG 59 04/21/2019   TRIG 86 12/16/2017   Lab Results  Component Value Date   CHOLHDL 3.5 01/01/2021   CHOLHDL 3.1 04/21/2019   CHOLHDL 5.8 12/16/2017    Lab Results  Component Value Date   HGBA1C 11.7 (A) 06/29/2021   HGBA1C 11.7 06/29/2021   HGBA1C 11.7 (A) 06/29/2021   HGBA1C 11.7 (A) 06/29/2021       Assessment & Plan:   Problem List Items Addressed This Visit       Cardiovascular and Mediastinum   Essential hypertension   Relevant Medications   amLODipine (NORVASC) 5 MG tablet   Other Relevant Orders   POCT URINALYSIS DIP (CLINITEK) (Completed)   CBC with Differential/Platelet   Comprehensive metabolic panel   Lipid panel Encouraged continued diet and exercise efforts  Encouraged continued compliance with medication       Endocrine   Type 2 diabetes mellitus without complication, without long-term current use of insulin (HCC) - Primary   Relevant Medications   insulin glargine (LANTUS) 100 UNIT/ML injection dose increased to 24 units, may trial Trulicty at follow up    Other Relevant Orders   HgB A1c (Completed)   CBC with Differential/Platelet   Comprehensive metabolic panel   Lipid panel   Glucose (CBG) (Completed)     Other   Shortness of breath   Relevant Medications   fluticasone-salmeterol (ADVAIR) 100-50 MCG/ACT AEPB   Other Relevant Orders   Ambulatory referral to Pulmonology  Informed patient that given her history, likely diagnosis of COPD as cause of symptoms, will needed further evaluation and management from specialist.   Other Visit Diagnoses     Healthcare maintenance       Relevant Orders   Flu Vaccine QUAD 4moIM (Fluarix, Fluzone & Alfiuria Quad PF) (Completed)   MM Digital Screening   Mold exposure       Relevant Orders   Fungus culture, blood  Discussed risk of continued mold exposure, new housing may be needed   Tobacco abuse       Relevant Medications   nicotine (NICODERM CQ - DOSED IN MG/24 HOURS) 14 mg/24hr patch Discussed risk factors of continued tobacco dependence, patient willing to trial nicotine patch    Follow up in 3 mths for reevaluation of diabetes and  hypertension    I have discontinued Kenika Zollars's insulin glargine and Vitamin D (Ergocalciferol). I have also changed her insulin glargine. Additionally, I am having her start on amLODipine, nicotine, and fluticasone-salmeterol. Lastly, I am having her maintain her Insulin Syringes (Disposable), ALPRAZolam, albuterol, aspirin, BD ULTRA-FINE PEN NEEDLES, thiamine, omeprazole, metFORMIN, traZODone, Insulin Pen Needle, glipiZIDE, furosemide, busPIRone, atorvastatin, and lisinopril.  Meds ordered this encounter  Medications   amLODipine (NORVASC) 5 MG tablet    Sig: Take 1 tablet (5 mg total) by mouth daily.    Dispense:  30 tablet    Refill:  0   DISCONTD: insulin glargine (LANTUS) 100 UNIT/ML injection    Sig: Inject 0.4 mLs (40 Units total) into the skin at bedtime.  Dispense:  10 mL    Refill:  3   nicotine (NICODERM CQ - DOSED IN MG/24 HOURS) 14 mg/24hr patch    Sig: Place 1 patch (14 mg total) onto the skin daily.    Dispense:  28 patch    Refill:  0   fluticasone-salmeterol (ADVAIR) 100-50 MCG/ACT AEPB    Sig: Inhale 1 puff into the lungs 2 (two) times daily.    Dispense:  1 each    Refill:  3   insulin glargine (LANTUS) 100 UNIT/ML injection    Sig: Inject 0.24 mLs (24 Units total) into the skin at bedtime.    Dispense:  10 mL    Refill:  3     Teena Dunk, NP

## 2021-06-30 LAB — CBC WITH DIFFERENTIAL/PLATELET
Basophils Absolute: 0.1 10*3/uL (ref 0.0–0.2)
Basos: 1 %
EOS (ABSOLUTE): 0.2 10*3/uL (ref 0.0–0.4)
Eos: 3 %
Hematocrit: 38.2 % (ref 34.0–46.6)
Hemoglobin: 12.5 g/dL (ref 11.1–15.9)
Immature Grans (Abs): 0 10*3/uL (ref 0.0–0.1)
Immature Granulocytes: 0 %
Lymphocytes Absolute: 1.9 10*3/uL (ref 0.7–3.1)
Lymphs: 37 %
MCH: 27.7 pg (ref 26.6–33.0)
MCHC: 32.7 g/dL (ref 31.5–35.7)
MCV: 85 fL (ref 79–97)
Monocytes Absolute: 0.3 10*3/uL (ref 0.1–0.9)
Monocytes: 6 %
Neutrophils Absolute: 2.7 10*3/uL (ref 1.4–7.0)
Neutrophils: 53 %
Platelets: 218 10*3/uL (ref 150–450)
RBC: 4.52 x10E6/uL (ref 3.77–5.28)
RDW: 16.7 % — ABNORMAL HIGH (ref 11.7–15.4)
WBC: 5.2 10*3/uL (ref 3.4–10.8)

## 2021-06-30 LAB — COMPREHENSIVE METABOLIC PANEL
ALT: 25 IU/L (ref 0–32)
AST: 31 IU/L (ref 0–40)
Albumin/Globulin Ratio: 1.8 (ref 1.2–2.2)
Albumin: 4.5 g/dL (ref 3.8–4.9)
Alkaline Phosphatase: 85 IU/L (ref 44–121)
BUN/Creatinine Ratio: 15 (ref 9–23)
BUN: 11 mg/dL (ref 6–24)
Bilirubin Total: 0.2 mg/dL (ref 0.0–1.2)
CO2: 22 mmol/L (ref 20–29)
Calcium: 9.6 mg/dL (ref 8.7–10.2)
Chloride: 97 mmol/L (ref 96–106)
Creatinine, Ser: 0.75 mg/dL (ref 0.57–1.00)
Globulin, Total: 2.5 g/dL (ref 1.5–4.5)
Glucose: 147 mg/dL — ABNORMAL HIGH (ref 65–99)
Potassium: 4.4 mmol/L (ref 3.5–5.2)
Sodium: 138 mmol/L (ref 134–144)
Total Protein: 7 g/dL (ref 6.0–8.5)
eGFR: 96 mL/min/{1.73_m2} (ref >59)

## 2021-06-30 LAB — LIPID PANEL
Chol/HDL Ratio: 4.1 ratio (ref 0.0–4.4)
Cholesterol, Total: 141 mg/dL (ref 100–199)
HDL: 34 mg/dL — ABNORMAL LOW (ref >39)
LDL Chol Calc (NIH): 91 mg/dL (ref 0–99)
Triglycerides: 82 mg/dL (ref 0–149)
VLDL Cholesterol Cal: 16 mg/dL (ref 5–40)

## 2021-07-10 ENCOUNTER — Other Ambulatory Visit: Payer: Self-pay

## 2021-07-11 ENCOUNTER — Encounter: Payer: Self-pay | Admitting: Nurse Practitioner

## 2021-07-11 ENCOUNTER — Telehealth (INDEPENDENT_AMBULATORY_CARE_PROVIDER_SITE_OTHER): Payer: Self-pay | Admitting: Nurse Practitioner

## 2021-07-11 ENCOUNTER — Other Ambulatory Visit: Payer: Self-pay

## 2021-07-11 DIAGNOSIS — F419 Anxiety disorder, unspecified: Secondary | ICD-10-CM

## 2021-07-11 MED ORDER — ALPRAZOLAM 0.5 MG PO TABS: 0.5000 mg | ORAL_TABLET | Freq: Two times a day (BID) | ORAL | 0 refills | Status: DC | PRN

## 2021-07-11 NOTE — Progress Notes (Signed)
Virtual Visit via Telephone Note  I connected with Misty Mccullough on 07/11/21 at 11:00 AM EDT by telephone and verified that I am speaking with the correct person using two identifiers.   I discussed the limitations, risks, security and privacy concerns of performing an evaluation and management service by telephone and the availability of in person appointments. I also discussed with the patient that there may be a patient responsible charge related to this service. The patient expressed understanding and agreed to proceed.  Patient home Provider Office  History of Present Illness:  Misty Mccullough  has a past medical history of Bilateral lower extremity edema, Cough, Diabetes mellitus without complication (HCC), Gallstones, Heavy cigarette smoker, Hemoglobin A1C between 7% and 9% indicating borderline diabetic control (08/2019), Hypercholesteremia, Hyperglycemia, Hypertension, Shortness of breath, and Vitamin D deficiency (12/2020).   States that her brother died from Cirrhosis 2 wks ago and it was a very painful death. Discovered yesterday that her husband was cheating on her yesterday. States that she has been very shaky and tearful. Concerned she may have another stroke. Denies any SI/HI. Has been taking prescribed Xanax at night, with temporary improvement, but states that she wakes up shaking again.    Review of Systems  Constitutional:  Negative for chills, fever and malaise/fatigue.  HENT: Negative.    Eyes: Negative.   Respiratory:  Negative for cough and shortness of breath.   Cardiovascular:  Negative for chest pain, palpitations and leg swelling.  Gastrointestinal:  Negative for abdominal pain, blood in stool, constipation, diarrhea, nausea and vomiting.  Skin: Negative.   Neurological: Negative.   Psychiatric/Behavioral:  Positive for depression. Negative for hallucinations and suicidal ideas. The patient is nervous/anxious.   All other systems reviewed and are negative.    Observations/Objective: No exam; telephone visit  Assessment and Plan: 1. Anxiety - ALPRAZolam (XANAX) 0.5 MG tablet; Take 1 tablet (0.5 mg total) by mouth 2 (two) times daily as needed for anxiety.  Dispense: 15 tablet; Refill: 0  Discussed other non pharmacological methods for management of anxiety/ grief  Will refer to in clinic LCSW Darl Pikes to initiate counseling and offer additional resources    Follow Up Instructions:  Maintain upcoming existing follow up, sooner as needed   I discussed the assessment and treatment plan with the patient. The patient was provided an opportunity to ask questions and all were answered. The patient agreed with the plan and demonstrated an understanding of the instructions.   The patient was advised to call back or seek an in-person evaluation if the symptoms worsen or if the condition fails to improve as anticipated.  I provided 20 minutes of telephone- visit time during this encounter.   Kathrynn Speed, NP

## 2021-07-13 ENCOUNTER — Telehealth: Payer: Self-pay | Admitting: Clinical

## 2021-07-13 NOTE — Telephone Encounter (Signed)
Integrated Behavioral Health Case Management Referral Note  07/13/2021 Name: Melana Hingle MRN: [id number] DOB: 10-Nov-1970 Verlean Allport is a 50 y.o. year old female who sees [place], Enid Derry I, NP for primary care. LCSW was consulted to assess patient's needs and assist the patient with Mental Health Counseling and Resources.  Interpreter: No.   Interpreter Name & Language: none  Assessment: Patient experiencing Mental Health Concerns  and Family and relationship dysfunction.   Intervention: CSW received referral for counseling resources from PCP after patient's last visit [facility]). CSW called patient today. Patient indicated she is ok and declined counseling resources. She indicated she is staying out of town with her daughter currently due to family issues and may stay there long term. Patient is considering applying for disability and Medicaid. Advised patient to contact Legal Aid for help with disability, as she has been denied in the past. Provided CSW contact information and advised patient to call with additional questions.     Review of patient status, including review of consultants reports, relevant laboratory and other test results, and collaboration with appropriate care team members and the patient's provider was performed as part of comprehensive patient evaluation and provision of services.    Abigail Butts, LCSW [facility] [phone]

## 2021-07-14 ENCOUNTER — Other Ambulatory Visit: Payer: Self-pay | Admitting: Nurse Practitioner

## 2021-07-14 DIAGNOSIS — I10 Essential (primary) hypertension: Secondary | ICD-10-CM

## 2021-07-30 ENCOUNTER — Ambulatory Visit: Payer: Self-pay | Admitting: Nurse Practitioner

## 2021-08-02 LAB — FUNGUS CULTURE, BLOOD

## 2021-11-27 ENCOUNTER — Ambulatory Visit: Payer: Self-pay | Admitting: Nurse Practitioner

## 2021-11-27 ENCOUNTER — Institutional Professional Consult (permissible substitution): Payer: Self-pay | Admitting: Pulmonary Disease

## 2021-11-30 ENCOUNTER — Other Ambulatory Visit: Payer: Self-pay

## 2021-11-30 ENCOUNTER — Ambulatory Visit (INDEPENDENT_AMBULATORY_CARE_PROVIDER_SITE_OTHER): Payer: Self-pay | Admitting: Nurse Practitioner

## 2021-11-30 VITALS — BP 190/80 | HR 79 | Temp 97.9°F | Ht 64.0 in | Wt 155.1 lb

## 2021-11-30 DIAGNOSIS — E119 Type 2 diabetes mellitus without complications: Secondary | ICD-10-CM

## 2021-11-30 DIAGNOSIS — Z720 Tobacco use: Secondary | ICD-10-CM

## 2021-11-30 DIAGNOSIS — R609 Edema, unspecified: Secondary | ICD-10-CM

## 2021-11-30 DIAGNOSIS — I10 Essential (primary) hypertension: Secondary | ICD-10-CM

## 2021-11-30 DIAGNOSIS — Z129 Encounter for screening for malignant neoplasm, site unspecified: Secondary | ICD-10-CM

## 2021-11-30 DIAGNOSIS — R600 Localized edema: Secondary | ICD-10-CM

## 2021-11-30 DIAGNOSIS — R143 Flatulence: Secondary | ICD-10-CM

## 2021-11-30 LAB — POCT URINALYSIS DIP (CLINITEK)
Bilirubin, UA: NEGATIVE
Blood, UA: NEGATIVE
Glucose, UA: NEGATIVE mg/dL
Ketones, POC UA: NEGATIVE mg/dL
Nitrite, UA: NEGATIVE
POC PROTEIN,UA: 30 — AB
Spec Grav, UA: <=1.005 — AB (ref 1.010–1.025)
Urobilinogen, UA: 1 E.U./dL
pH, UA: 5.5 (ref 5.0–8.0)

## 2021-11-30 LAB — POCT GLYCOSYLATED HEMOGLOBIN (HGB A1C)
HbA1c POC (<> result, manual entry): 10.5 % (ref 4.0–5.6)
HbA1c, POC (controlled diabetic range): 10.5 % — AB (ref 0.0–7.0)
HbA1c, POC (prediabetic range): 10.5 % — AB (ref 5.7–6.4)
Hemoglobin A1C: 10.5 % — AB (ref 4.0–5.6)

## 2021-11-30 MED ORDER — HYDROCHLOROTHIAZIDE 25 MG PO TABS: 25.0000 mg | ORAL_TABLET | Freq: Every day | ORAL | 2 refills | Status: DC

## 2021-11-30 MED ORDER — AMLODIPINE BESYLATE 10 MG PO TABS: 10.0000 mg | ORAL_TABLET | Freq: Every day | ORAL | 2 refills | Status: DC

## 2021-11-30 NOTE — Progress Notes (Signed)
[place],   [id number] Phone:  [phone]   Fax:  [phone] Subjective:   Patient ID: Misty Mccullough, female    DOB: June 01, 1971, 51 y.o.   MRN: [id number]  Chief Complaint  Patient presents with   Follow-up    Pt is here for 1 month follow up. Pt stated she has been feeling bloated since menstrual cycle has stopped    HPI Cassandre Oleksy 51 y.o. female  has a past medical history of Bilateral lower extremity edema, Cough, Diabetes mellitus without complication ([place]), Gallstones, Heavy cigarette smoker, Hemoglobin A1C between 7% and 9% indicating borderline diabetic control ([place]) (08/2019), Hypercholesteremia, Hyperglycemia, Hypertension, Shortness of breath, and Vitamin D deficiency (12/2020). To the [facility] for follow up for chronic illness and abdominal bloating.   Patient states that her symptoms have improved since her last visit, she no resides in the same location, has moved in with her daughter.  Concerned about increased flatulence with significant odor. Has taken Gas x with no improvement in symptoms. Denies any constipation, states that she has bowel movement daily. Denies any worsening or improving factors. LMP 2 yrs. Denies any stool abnormalities. Flatulence occurs throughout the day.  Endorses being compliant with metformin, unknown when she last took insulin, due to cost. States that it has been several months.   Verbalizes concern for bilateral foot swelling x 1.5 mths. States that she is compliant with B/P medications. Swelling is the most pronounced in the LLE, denies any pain.   Endorses smoking 1 PPD cigarettes, states that amount has decreased. Denies any desire to quit at this time. Currently in the process of getting disability. Generally eats healthy, but does not exercise regularly. Denies any other complaints today.  Denies any fever. Denies any fatigue, chest pain, shortness of breath, HA or dizziness. Denies any  blurred vision, numbness or tingling.   Past Medical History:  Diagnosis Date   Bilateral lower extremity edema    Cough    Diabetes mellitus without complication ([place])    Gallstones    Heavy cigarette smoker    Hemoglobin A1C between 7% and 9% indicating borderline diabetic control ([place]) 08/2019   Hypercholesteremia    Hyperglycemia    Hypertension    Shortness of breath    Vitamin D deficiency 12/2020    Past Surgical History:  Procedure Laterality Date   TUBAL LIGATION      Family History  Problem Relation Age of Onset   Diabetes Mother    Uterine cancer Mother    Diabetes Father    Heart attack Father 6   Stroke Father    Heart attack Paternal Grandmother    Heart attack Paternal Grandfather    Stroke Sister    Heart attack Paternal Uncle    Bone cancer Paternal Uncle     Social History   Socioeconomic History   Marital status: Married    Spouse name: Not on file   Number of children: Not on file   Years of education: Not on file   Highest education level: Not on file  Occupational History   Not on file  Tobacco Use   Smoking status: Every Day    Packs/day: 1.00    Types: Cigarettes   Smokeless tobacco: Never   Tobacco comments:    Since age 15.  Vaping Use   Vaping Use: Never used  Substance and Sexual Activity   Alcohol use: Not Currently    Comment:  Occasional   Drug use: No   Sexual activity: Yes    Partners: Male  Other Topics Concern   Not on file  Social History Narrative   Not on file   Social Determinants of Health   Financial Resource Strain: Not on file  Food Insecurity: Not on file  Transportation Needs: Not on file  Physical Activity: Not on file  Stress: Not on file  Social Connections: Not on file  Intimate Partner Violence: Not on file    Outpatient Medications Prior to Visit  Medication Sig Dispense Refill   ALPRAZolam (XANAX) 0.5 MG tablet Take 1 tablet (0.5 mg total) by mouth 2 (two) times daily as needed for  anxiety. 15 tablet 0   aspirin 325 MG EC tablet Take 1 tablet (325 mg total) by mouth daily. 30 tablet 11   atorvastatin (LIPITOR) 10 MG tablet TAKE 1 TABLET (10 MG TOTAL) BY MOUTH DAILY. 30 tablet 11   busPIRone (BUSPAR) 10 MG tablet TAKE 1 TABLET (10 MG TOTAL) BY MOUTH 3 (THREE) TIMES DAILY. 60 tablet 11   furosemide (LASIX) 20 MG tablet TAKE 1 TABLET (20 MG TOTAL) BY MOUTH 2 (TWO) TIMES DAILY AS NEEDED. 60 tablet 11   glipiZIDE (GLUCOTROL) 10 MG tablet TAKE 1 TABLET (10 MG TOTAL) BY MOUTH 2 (TWO) TIMES DAILY BEFORE A MEAL. 180 tablet 3   Insulin Syringes, Disposable, U-100 0.3 ML MISC 1 each by Does not apply route at bedtime. 100 each 11   lisinopril (ZESTRIL) 40 MG tablet TAKE 1 TABLET (40 MG TOTAL) BY MOUTH DAILY. 30 tablet 11   metFORMIN (GLUCOPHAGE) 1000 MG tablet TAKE 1 TABLET (1,000 MG TOTAL) BY MOUTH 2 (TWO) TIMES DAILY WITH A MEAL. 60 tablet 11   omeprazole (PRILOSEC) 40 MG capsule TAKE 1 CAPSULE (40 MG TOTAL) BY MOUTH DAILY. 30 capsule 11   thiamine 100 MG tablet Take 1 tablet (100 mg total) by mouth daily. 30 tablet 11   amLODipine (NORVASC) 5 MG tablet Take 1 tablet by mouth once daily 30 tablet 0   albuterol (VENTOLIN HFA) 108 (90 Base) MCG/ACT inhaler Inhale 2 puffs into the lungs every 6 (six) hours as needed for wheezing or shortness of breath. (Patient not taking: Reported on 11/30/2021) 1 each 11   fluticasone-salmeterol (ADVAIR) 100-50 MCG/ACT AEPB Inhale 1 puff into the lungs 2 (two) times daily. (Patient not taking: Reported on 11/30/2021) 1 each 3   insulin glargine (LANTUS) 100 UNIT/ML injection Inject 0.24 mLs (24 Units total) into the skin at bedtime. (Patient not taking: Reported on 11/30/2021) 10 mL 3   Insulin Pen Needle (BD ULTRA-FINE PEN NEEDLES) 29G X 12.7MM MISC ICD10 E11.9 for use with insulin administration (Patient not taking: Reported on 11/30/2021) 200 each 11   Insulin Pen Needle 32G X 4 MM MISC FOR USE WITH INSULIN ADMINISTRATION (Patient not taking: Reported on  11/30/2021) 200 each 11   nicotine (NICODERM CQ - DOSED IN MG/24 HOURS) 14 mg/24hr patch Place 1 patch (14 mg total) onto the skin daily. (Patient not taking: Reported on 11/30/2021) 28 patch 0   traZODone (DESYREL) 100 MG tablet TAKE 1 TABLET (100 MG TOTAL) BY MOUTH AT BEDTIME AS NEEDED FOR SLEEP. (Patient not taking: Reported on 11/30/2021) 30 tablet 11   No facility-administered medications prior to visit.    No Known Allergies  Review of Systems  Constitutional:  Negative for chills, fever and malaise/fatigue.  Respiratory:  Negative for cough and shortness of breath.   Cardiovascular:  Positive for leg swelling. Negative for chest pain and palpitations.  Gastrointestinal:  Negative for abdominal pain, blood in stool, constipation, diarrhea, nausea and vomiting.       See HPI  Musculoskeletal: Negative.   Skin: Negative.   Neurological: Negative.   Psychiatric/Behavioral:  Negative for depression. The patient is not nervous/anxious.   All other systems reviewed and are negative.     Objective:    Physical Exam Vitals reviewed.  Constitutional:      General: She is not in acute distress.    Appearance: Normal appearance.  HENT:     Head: Normocephalic.     Mouth/Throat:     Mouth: Mucous membranes are moist.     Pharynx: Oropharynx is clear. No oropharyngeal exudate or posterior oropharyngeal erythema.  Cardiovascular:     Rate and Rhythm: Normal rate and regular rhythm.     Pulses: Normal pulses.     Heart sounds: Normal heart sounds.  Pulmonary:     Effort: Pulmonary effort is normal. No respiratory distress.     Breath sounds: Normal breath sounds. No stridor. No wheezing, rhonchi or rales.  Chest:     Chest wall: No tenderness.  Abdominal:     General: Abdomen is flat. Bowel sounds are normal. There is no distension.     Palpations: Abdomen is soft. There is no mass.     Tenderness: There is no abdominal tenderness. There is no right CVA tenderness, left CVA  tenderness, guarding or rebound.     Hernia: No hernia is present.  Musculoskeletal:        General: Swelling present. Normal range of motion.     Right lower leg: Edema present.     Left lower leg: Edema present.     Comments: Moderate swelling noted to LLE and mild swelling to RLE  Skin:    General: Skin is warm and dry.     Capillary Refill: Capillary refill takes less than 2 seconds.  Neurological:     General: No focal deficit present.     Mental Status: She is alert and oriented to person, place, and time.  Psychiatric:        Mood and Affect: Mood normal.        Behavior: Behavior normal.        Thought Content: Thought content normal.        Judgment: Judgment normal.    BP (!) 190/80 (BP Location: Right Arm, Cuff Size: Normal)    Pulse 79    Temp 97.9 F (36.6 C)    Ht 5' 4"  (1.626 m)    Wt 155 lb 0.8 oz (70.3 kg)    SpO2 100%    BMI 26.61 kg/m  Wt Readings from Last 3 Encounters:  11/30/21 155 lb 0.8 oz (70.3 kg)  06/29/21 154 lb 0.2 oz (69.9 kg)  01/01/21 161 lb (73 kg)    Immunization History  Administered Date(s) Administered   Influenza Whole 06/21/2011   Influenza,inj,Quad PF,6+ Mos 09/06/2016, 08/17/2018, 08/25/2019, 06/29/2021   PFIZER Comirnaty(Gray Top)Covid-19 Tri-Sucrose Vaccine 01/15/2020, 02/09/2020   Pneumococcal Conjugate-13 08/25/2019   Pneumococcal Polysaccharide-23 12/06/2016   Pneumococcal-Unspecified 06/21/2011   Tdap 12/06/2016    Diabetic Foot Exam - Simple   No data filed     Lab Results  Component Value Date   TSH 0.980 01/01/2021   Lab Results  Component Value Date   WBC 4.6 11/30/2021   HGB 10.8 (L) 11/30/2021   HCT 33.4 (L) 11/30/2021  MCV 84 11/30/2021   PLT 244 11/30/2021   Lab Results  Component Value Date   NA 145 (H) 11/30/2021   K 4.3 11/30/2021   CO2 23 11/30/2021   GLUCOSE 81 11/30/2021   BUN 8 11/30/2021   CREATININE 0.66 11/30/2021   BILITOT 0.2 11/30/2021   ALKPHOS 82 11/30/2021   AST 24 11/30/2021    ALT 20 11/30/2021   PROT 6.6 11/30/2021   ALBUMIN 4.0 11/30/2021   CALCIUM 9.3 11/30/2021   ANIONGAP 7 12/16/2017   EGFR 105 11/30/2021   Lab Results  Component Value Date   CHOL 141 06/29/2021   CHOL 120 01/01/2021   CHOL 106 04/21/2019   Lab Results  Component Value Date   HDL 34 (L) 06/29/2021   HDL 34 (L) 01/01/2021   HDL 34 (L) 04/21/2019   Lab Results  Component Value Date   LDLCALC 91 06/29/2021   LDLCALC 67 01/01/2021   LDLCALC 60 04/21/2019   Lab Results  Component Value Date   TRIG 82 06/29/2021   TRIG 99 01/01/2021   TRIG 59 04/21/2019   Lab Results  Component Value Date   CHOLHDL 4.1 06/29/2021   CHOLHDL 3.5 01/01/2021   CHOLHDL 3.1 04/21/2019   Lab Results  Component Value Date   HGBA1C 10.5 (A) 11/30/2021   HGBA1C 10.5 11/30/2021   HGBA1C 10.5 (A) 11/30/2021   HGBA1C 10.5 (A) 11/30/2021       Assessment & Plan:   Problem List Items Addressed This Visit       Cardiovascular and Mediastinum   Essential hypertension   Relevant Medications   amLODipine (NORVASC) 10 MG tablet   hydrochlorothiazide (HYDRODIURIL) 25 MG tablet   Other Relevant Orders   CBC with Differential/Platelet (Completed)   Comprehensive metabolic panel (Completed) Discussed diet at length  Discussed non pharmacological methods for management of symptoms      Endocrine   Type 2 diabetes mellitus without complication, without long-term current use of insulin (HCC)   Relevant Orders   POCT glycosylated hemoglobin (Hb A1C) (Completed)   POCT URINALYSIS DIP (CLINITEK) (Completed)   CBC with Differential/Platelet (Completed)   Comprehensive metabolic panel (Completed) Encouraged continued diet and exercise efforts  Encouraged continued compliance with medication  In clinic social worker to provide patient with resources for insulin    Other Visit Diagnoses     Flatulence/wind     Discussed diet at length  Discussed non pharmacological methods for management of  symptoms  Informed to take OTC medications as needed for symptoms   Peripheral Edema HCTZ added to treatment plan Discussed diet at length Discussed non pharmacological methods for management of symptoms Tobacco Abuse Encouraged cessation Discussed methods for cessation Screening for cancer       Relevant Orders   Ambulatory referral to Gastroenterology   Follow up in 3 mths for reevaluation of symptoms, sooner as needed    I have changed Shakeitha Casebolt's amLODipine. I am also having her start on hydrochlorothiazide. Additionally, I am having her maintain her Insulin Syringes (Disposable), albuterol, aspirin, BD ULTRA-FINE PEN NEEDLES, thiamine, omeprazole, metFORMIN, traZODone, Insulin Pen Needle, glipiZIDE, furosemide, busPIRone, atorvastatin, lisinopril, nicotine, fluticasone-salmeterol, insulin glargine, and ALPRAZolam.  Meds ordered this encounter  Medications   amLODipine (NORVASC) 10 MG tablet    Sig: Take 1 tablet (10 mg total) by mouth daily.    Dispense:  30 tablet    Refill:  2   hydrochlorothiazide (HYDRODIURIL) 25 MG tablet    Sig: Take 1 tablet (  25 mg total) by mouth daily.    Dispense:  30 tablet    Refill:  2     Teena Dunk, NP

## 2021-11-30 NOTE — Patient Instructions (Signed)
You were seen today in the [facility] for reevaluation of chronic illness and bloating. Labs were collected, results will be available via MyChart or, if abnormal, you will be contacted by clinic staff. You were prescribed medications, please take as directed. Please follow up in 3 mths for reevaluation of symptoms

## 2021-12-01 LAB — CBC WITH DIFFERENTIAL/PLATELET
Basophils Absolute: 0 10*3/uL (ref 0.0–0.2)
Basos: 1 %
EOS (ABSOLUTE): 0.1 10*3/uL (ref 0.0–0.4)
Eos: 2 %
Hematocrit: 33.4 % — ABNORMAL LOW (ref 34.0–46.6)
Hemoglobin: 10.8 g/dL — ABNORMAL LOW (ref 11.1–15.9)
Immature Grans (Abs): 0 10*3/uL (ref 0.0–0.1)
Immature Granulocytes: 0 %
Lymphocytes Absolute: 1.4 10*3/uL (ref 0.7–3.1)
Lymphs: 31 %
MCH: 27 pg (ref 26.6–33.0)
MCHC: 32.3 g/dL (ref 31.5–35.7)
MCV: 84 fL (ref 79–97)
Monocytes Absolute: 0.3 10*3/uL (ref 0.1–0.9)
Monocytes: 6 %
Neutrophils Absolute: 2.8 10*3/uL (ref 1.4–7.0)
Neutrophils: 60 %
Platelets: 244 10*3/uL (ref 150–450)
RBC: 4 x10E6/uL (ref 3.77–5.28)
RDW: 15.2 % (ref 11.7–15.4)
WBC: 4.6 10*3/uL (ref 3.4–10.8)

## 2021-12-01 LAB — COMPREHENSIVE METABOLIC PANEL
ALT: 20 IU/L (ref 0–32)
AST: 24 IU/L (ref 0–40)
Albumin/Globulin Ratio: 1.5 (ref 1.2–2.2)
Albumin: 4 g/dL (ref 3.8–4.9)
Alkaline Phosphatase: 82 IU/L (ref 44–121)
BUN/Creatinine Ratio: 12 (ref 9–23)
BUN: 8 mg/dL (ref 6–24)
Bilirubin Total: 0.2 mg/dL (ref 0.0–1.2)
CO2: 23 mmol/L (ref 20–29)
Calcium: 9.3 mg/dL (ref 8.7–10.2)
Chloride: 104 mmol/L (ref 96–106)
Creatinine, Ser: 0.66 mg/dL (ref 0.57–1.00)
Globulin, Total: 2.6 g/dL (ref 1.5–4.5)
Glucose: 81 mg/dL (ref 70–99)
Potassium: 4.3 mmol/L (ref 3.5–5.2)
Sodium: 145 mmol/L — ABNORMAL HIGH (ref 134–144)
Total Protein: 6.6 g/dL (ref 6.0–8.5)
eGFR: 105 mL/min/{1.73_m2} (ref >59)

## 2021-12-03 ENCOUNTER — Encounter: Payer: Self-pay | Admitting: Nurse Practitioner

## 2021-12-03 ENCOUNTER — Telehealth: Payer: Self-pay | Admitting: Clinical

## 2021-12-03 NOTE — Telephone Encounter (Signed)
[facility] General Follow Up Note  12/03/2021 Name: Misty Mccullough MRN: [id number] DOB: 1970/10/13 [name] is a 51 y.o. year old female who sees [place], Enid Derry I, NP for primary care. LCSW was initially consulted to assist with financial difficulties.   Interpreter: No.   Interpreter Name & Language: none  Assessment: Patient experiencing financial difficulties due to lack of health coverage and income. She cannot afford her insulin.  Ongoing Intervention: Today CSW called patient to follow up. Advised patient to apply for [facility] free pharmacy program. Patient is not currently eligible to apply for CAFA or [employer] Card due to pending Medicaid and disability applications. Reviewed application with patient over the phone and mailed to patient. Advised patient to call with questions.   Review of patient status, including review of consultants reports, relevant laboratory and other test results, and collaboration with appropriate care team members and the patient's provider was performed as part of comprehensive patient evaluation and provision of services.    Abigail Butts, LCSW [facility] [phone]

## 2021-12-12 ENCOUNTER — Other Ambulatory Visit: Payer: Self-pay

## 2021-12-12 DIAGNOSIS — R0602 Shortness of breath: Secondary | ICD-10-CM

## 2021-12-13 ENCOUNTER — Ambulatory Visit (INDEPENDENT_AMBULATORY_CARE_PROVIDER_SITE_OTHER): Payer: Self-pay

## 2021-12-13 ENCOUNTER — Encounter: Payer: Self-pay | Admitting: Pulmonary Disease

## 2021-12-13 ENCOUNTER — Other Ambulatory Visit: Payer: Self-pay

## 2021-12-13 ENCOUNTER — Ambulatory Visit (INDEPENDENT_AMBULATORY_CARE_PROVIDER_SITE_OTHER): Payer: Self-pay | Admitting: Pulmonary Disease

## 2021-12-13 VITALS — BP 100/60 | HR 93 | Ht 66.0 in | Wt 148.2 lb

## 2021-12-13 DIAGNOSIS — R051 Acute cough: Secondary | ICD-10-CM

## 2021-12-13 DIAGNOSIS — R059 Cough, unspecified: Secondary | ICD-10-CM

## 2021-12-13 DIAGNOSIS — R0602 Shortness of breath: Secondary | ICD-10-CM

## 2021-12-13 DIAGNOSIS — J42 Unspecified chronic bronchitis: Secondary | ICD-10-CM

## 2021-12-13 DIAGNOSIS — Z760 Encounter for issue of repeat prescription: Secondary | ICD-10-CM

## 2021-12-13 MED ORDER — ALBUTEROL SULFATE HFA 108 (90 BASE) MCG/ACT IN AERS: 2.0000 | INHALATION_SPRAY | Freq: Four times a day (QID) | RESPIRATORY_TRACT | 11 refills | Status: DC | PRN

## 2021-12-13 MED ORDER — SPIRIVA RESPIMAT 2.5 MCG/ACT IN AERS: 2.0000 | INHALATION_SPRAY | Freq: Every day | RESPIRATORY_TRACT | 0 refills | Status: DC

## 2021-12-13 NOTE — Patient Instructions (Signed)
Chronic bronchitis/Suspected COPD ?--START Spiriva 2.5 mcg TWO puffs ONCE a day ?--START Albuterol AS NEEDED for shortness of breath or wheezing ?--Will order PFTs in the future ? ?Follow-up with me in 1 month ?

## 2021-12-13 NOTE — Progress Notes (Signed)
Subjective:   PATIENT ID: Misty Mccullough GENDER: female DOB: 03-Dec-1970, MRN: [id number]   HPI  Chief Complaint  Patient presents with   Consult    Mold in lungs, coughs up green junk    Reason for Visit: New consult for cough  [name] is a 51 year old female active smoker with hypertension, CVA, diabetes who presents for evaluation of cough.  PCP notes reviewed from 06/29/21 and incorporated into HPI as noted below.  Since last year she has had worsening productive cough with purulent sputum. In September 2022 she thought this was related to mold exposure and has moved out four months ago. After moving out her migraines improved but she to have cough during the day and night. Associated with wheezing and shortness of breath with short distances. Her prior stroke has limited her ambulation on her left side. She has been prescribed Advair however has not picked this up due to inability to pay for medications at this time.  She does not use her rescue inhaler because was told to only use it during emergency and even though she has been symptomatic has not used this.  Social History: Active smoker 1 ppd x 32 years. Currently not considering quitting.  I have personally reviewed patient's past medical/family/social history, allergies, current medications.  Past Medical History:  Diagnosis Date   Bilateral lower extremity edema    Cough    Diabetes mellitus without complication (HCC)    Gallstones    Heavy cigarette smoker    Hemoglobin A1C between 7% and 9% indicating borderline diabetic control (HCC) 08/2019   Hypercholesteremia    Hyperglycemia    Hypertension    Shortness of breath    Vitamin D deficiency 12/2020     Family History  Problem Relation Age of Onset   Diabetes Mother    Uterine cancer Mother    Diabetes Father    Heart attack Father 23   Stroke Father    Heart attack Paternal Grandmother    Heart attack Paternal Grandfather    Stroke Sister     Heart attack Paternal Uncle    Bone cancer Paternal Uncle      Social History   Occupational History   Not on file  Tobacco Use   Smoking status: Every Day    Packs/day: 1.00    Years: 32.00    Pack years: 32.00    Types: Cigarettes   Smokeless tobacco: Never   Tobacco comments:    Since age 51.  Vaping Use   Vaping Use: Never used  Substance and Sexual Activity   Alcohol use: Not Currently    Comment: Occasional   Drug use: No   Sexual activity: Yes    Partners: Male    No Known Allergies   Outpatient Medications Prior to Visit  Medication Sig Dispense Refill   amLODipine (NORVASC) 10 MG tablet Take 1 tablet (10 mg total) by mouth daily. 30 tablet 2   aspirin 325 MG EC tablet Take 1 tablet (325 mg total) by mouth daily. 30 tablet 11   atorvastatin (LIPITOR) 10 MG tablet TAKE 1 TABLET (10 MG TOTAL) BY MOUTH DAILY. 30 tablet 11   furosemide (LASIX) 20 MG tablet TAKE 1 TABLET (20 MG TOTAL) BY MOUTH 2 (TWO) TIMES DAILY AS NEEDED. 60 tablet 11   glipiZIDE (GLUCOTROL) 10 MG tablet TAKE 1 TABLET (10 MG TOTAL) BY MOUTH 2 (TWO) TIMES DAILY BEFORE A MEAL. 180 tablet 3   hydrochlorothiazide (HYDRODIURIL) 25  MG tablet Take 1 tablet (25 mg total) by mouth daily. 30 tablet 2   lisinopril (ZESTRIL) 40 MG tablet TAKE 1 TABLET (40 MG TOTAL) BY MOUTH DAILY. 30 tablet 11   metFORMIN (GLUCOPHAGE) 1000 MG tablet TAKE 1 TABLET (1,000 MG TOTAL) BY MOUTH 2 (TWO) TIMES DAILY WITH A MEAL. 60 tablet 11   omeprazole (PRILOSEC) 40 MG capsule TAKE 1 CAPSULE (40 MG TOTAL) BY MOUTH DAILY. 30 capsule 11   ALPRAZolam (XANAX) 0.5 MG tablet Take 1 tablet (0.5 mg total) by mouth 2 (two) times daily as needed for anxiety. (Patient not taking: Reported on 12/13/2021) 15 tablet 0   busPIRone (BUSPAR) 10 MG tablet TAKE 1 TABLET (10 MG TOTAL) BY MOUTH 3 (THREE) TIMES DAILY. (Patient not taking: Reported on 12/13/2021) 60 tablet 11   fluticasone-salmeterol (ADVAIR) 100-50 MCG/ACT AEPB Inhale 1 puff into the lungs 2  (two) times daily. (Patient not taking: Reported on 11/30/2021) 1 each 3   insulin glargine (LANTUS) 100 UNIT/ML injection Inject 0.24 mLs (24 Units total) into the skin at bedtime. (Patient not taking: Reported on 11/30/2021) 10 mL 3   Insulin Pen Needle (BD ULTRA-FINE PEN NEEDLES) 29G X 12.7MM MISC ICD10 E11.9 for use with insulin administration (Patient not taking: Reported on 11/30/2021) 200 each 11   Insulin Pen Needle 32G X 4 MM MISC FOR USE WITH INSULIN ADMINISTRATION (Patient not taking: Reported on 11/30/2021) 200 each 11   Insulin Syringes, Disposable, U-100 0.3 ML MISC 1 each by Does not apply route at bedtime. (Patient not taking: Reported on 12/13/2021) 100 each 11   nicotine (NICODERM CQ - DOSED IN MG/24 HOURS) 14 mg/24hr patch Place 1 patch (14 mg total) onto the skin daily. (Patient not taking: Reported on 11/30/2021) 28 patch 0   thiamine 100 MG tablet Take 1 tablet (100 mg total) by mouth daily. (Patient not taking: Reported on 12/13/2021) 30 tablet 11   traZODone (DESYREL) 100 MG tablet TAKE 1 TABLET (100 MG TOTAL) BY MOUTH AT BEDTIME AS NEEDED FOR SLEEP. (Patient not taking: Reported on 11/30/2021) 30 tablet 11   albuterol (VENTOLIN HFA) 108 (90 Base) MCG/ACT inhaler Inhale 2 puffs into the lungs every 6 (six) hours as needed for wheezing or shortness of breath. (Patient not taking: Reported on 11/30/2021) 1 each 11   No facility-administered medications prior to visit.    Review of Systems  Constitutional:  Positive for malaise/fatigue. Negative for chills, diaphoresis, fever and weight loss.  HENT:  Negative for congestion.   Respiratory:  Positive for cough, sputum production, shortness of breath and wheezing. Negative for hemoptysis.   Cardiovascular:  Negative for chest pain, palpitations and leg swelling.    Objective:   Vitals:   12/13/21 1135  BP: 100/60  Pulse: 93  SpO2: 97%  Weight: 148 lb 3.2 oz (67.2 kg)  Height: 5\' 6"  (1.676 m)   SpO2: 97 % O2 Device: None (Room  air)  Physical Exam: General: Well-appearing, no acute distress HENT: [place], AT Eyes: EOMI, no scleral icterus Respiratory: Clear to auscultation bilaterally.  No crackles, wheezing or rales Cardiovascular: RRR, -M/R/G, no JVD Extremities:-Edema,-tenderness Neuro: AAO x4, CNII-XII grossly intact Psych: Normal mood, normal affect  Data Reviewed:  Imaging: CXR 12/15/17 - No infiltrate, effusion, edema CXR 12/13/2021-normal chest x-ray with no infiltrate, effusion or edema  PFT: None on file  Labs: CBC    Component Value Date/Time   WBC 4.6 11/30/2021 1111   WBC 4.5 12/16/2017 0348   RBC 4.00 11/30/2021  1111   RBC 4.33 12/16/2017 0348   HGB 10.8 (L) 11/30/2021 1111   HCT 33.4 (L) 11/30/2021 1111   PLT 244 11/30/2021 1111   MCV 84 11/30/2021 1111   MCH 27.0 11/30/2021 1111   MCH 29.1 12/16/2017 0348   MCHC 32.3 11/30/2021 1111   MCHC 33.4 12/16/2017 0348   RDW 15.2 11/30/2021 1111   LYMPHSABS 1.4 11/30/2021 1111   MONOABS 0.3 12/15/2017 1329   EOSABS 0.1 11/30/2021 1111   BASOSABS 0.0 11/30/2021 1111   Absolute 12/13/21-100     Assessment & Plan:   Discussion: 51 year old female active smoker with hypertension, CVA, diabetes who presents for evaluation of cough.  In the setting of active tobacco use and concerned for COPD driving her symptoms.  She does have history of mold exposure and + rhodotorula mucilaginosa. Discussed findings with Infectious Disease and +culture likely contaminant without clinical significance or indication for treatment especially since >6 months. In clinic she is nontoxic-appearing and chest XR without infiltrate. Of noteshe currently does not have insurance and is under financial constraints.  Chronic bronchitis/Suspected COPD --START Spiriva 2.5 mcg TWO puffs ONCE a day --START Albuterol AS NEEDED for shortness of breath or wheezing --Will order PFTs in the future  Health Maintenance Immunization History  Administered Date(s) Administered    Influenza Whole 06/21/2011   Influenza,inj,Quad PF,6+ Mos 09/06/2016, 08/17/2018, 08/25/2019, 06/29/2021   PFIZER Comirnaty(Gray Top)Covid-19 Tri-Sucrose Vaccine 01/15/2020, 02/09/2020   Pneumococcal Conjugate-13 08/25/2019   Pneumococcal Polysaccharide-23 12/06/2016   Pneumococcal-Unspecified 06/21/2011   Tdap 12/06/2016   CT Lung Screen - not qualified due to age  Orders Placed This Encounter  Procedures   DG Chest 2 View    Standing Status:   Future    Number of Occurrences:   1    Standing Expiration Date:   12/14/2022    Order Specific Question:   Reason for Exam (SYMPTOM  OR DIAGNOSIS REQUIRED)    Answer:   cough    Order Specific Question:   Preferred imaging location?    Answer:   Internal    Order Specific Question:   Is patient pregnant?    Answer:   No   Pulmonary function test    Standing Status:   Future    Standing Expiration Date:   12/14/2022    Scheduling Instructions:     PFT In 1 mth. With f/u OV    Order Specific Question:   Where should this test be performed?    Answer:   [facility]    Order Specific Question:   Full PFT: includes the following: basic spirometry, spirometry pre & post bronchodilator, diffusion capacity (DLCO), lung volumes    Answer:   Full PFT    Order Specific Question:   MIP/MEP    Answer:   No    Order Specific Question:   6 minute walk    Answer:   No    Order Specific Question:   ABG    Answer:   No    Order Specific Question:   Diffusion capacity (DLCO)    Answer:   Yes    Order Specific Question:   Lung volumes    Answer:   Yes    Order Specific Question:   Methacholine challenge    Answer:   No   Meds ordered this encounter  Medications   albuterol (VENTOLIN HFA) 108 (90 Base) MCG/ACT inhaler    Sig: Inhale 2 puffs into the lungs every 6 (six) hours  as needed for wheezing or shortness of breath.    Dispense:  1 each    Refill:  11   Tiotropium Bromide Monohydrate (SPIRIVA RESPIMAT) 2.5 MCG/ACT AERS    Sig: Inhale 2  puffs into the lungs daily.    Dispense:  4 g    Refill:  0    Order Specific Question:   Lot Number?    Answer:   [id number]    Order Specific Question:   Expiration Date?    Answer:   11/07/2022    Order Specific Question:   Quantity    Answer:   2    Return in about 1 month (around 01/13/2022).  I have spent a total time of 45-minutes on the day of the appointment reviewing prior documentation, coordinating care and discussing medical diagnosis and plan with the patient/family. Imaging, labs and tests included in this note have been reviewed and interpreted independently by me.  Lamorris Knoblock Mechele CollinJane Raford Brissett, MD [facility] 12/13/2021 12:21 PM  Office Number [phone]

## 2021-12-26 ENCOUNTER — Other Ambulatory Visit: Payer: Self-pay

## 2021-12-26 DIAGNOSIS — I10 Essential (primary) hypertension: Secondary | ICD-10-CM

## 2021-12-26 DIAGNOSIS — Z760 Encounter for issue of repeat prescription: Secondary | ICD-10-CM

## 2021-12-26 MED ORDER — LISINOPRIL 40 MG PO TABS: ORAL_TABLET | Freq: Every day | ORAL | 2 refills | Status: DC

## 2022-01-24 ENCOUNTER — Ambulatory Visit: Payer: Medicaid Other | Admitting: Pulmonary Disease

## 2022-01-24 NOTE — Progress Notes (Deleted)
Subjective:   PATIENT ID: Misty Mccullough GENDER: female DOB: 01/06/1971, MRN: [id number]   HPI  No chief complaint on file.   Reason for Visit: Follow up  Misty Mccullough is a 51 year old female active smoker with HTN, CVA, DM2 who presents for follow-up  Synopsis: Since last year she has had worsening productive cough with purulent sputum. In September 2022 she thought this was related to mold exposure and has moved out four months ago. After moving out her migraines improved but she to have cough during the day and night. Associated with wheezing and shortness of breath with short distances. Her prior stroke has limited her ambulation on her left side. She has been prescribed Advair however has not picked this up due to inability to pay for medications at this time.  She does not use her rescue inhaler because was told to only use it during emergency and even though she has been symptomatic has not used this.  01/24/22  Social History: Active smoker 1 ppd x 32 years. Currently not considering quitting.   Past Medical History:  Diagnosis Date   Bilateral lower extremity edema    Cough    Diabetes mellitus without complication (HCC)    Gallstones    Heavy cigarette smoker    Hemoglobin A1C between 7% and 9% indicating borderline diabetic control (HCC) 08/2019   Hypercholesteremia    Hyperglycemia    Hypertension    Shortness of breath    Vitamin D deficiency 12/2020     Family History  Problem Relation Age of Onset   Diabetes Mother    Uterine cancer Mother    Diabetes Father    Heart attack Father [phone]   Stroke Father    Heart attack Paternal Grandmother    Heart attack Paternal Grandfather    Stroke Sister    Heart attack Paternal Uncle    Bone cancer Paternal Uncle      Social History   Occupational History   Not on file  Tobacco Use   Smoking status: Every Day    Packs/day: 1.00    Years: 32.00    Pack years: 32.00    Types: Cigarettes   Smokeless  tobacco: Never   Tobacco comments:    Since age 51.  Vaping Use   Vaping Use: Never used  Substance and Sexual Activity   Alcohol use: Not Currently    Comment: Occasional   Drug use: No   Sexual activity: Yes    Partners: Male    No Known Allergies   Outpatient Medications Prior to Visit  Medication Sig Dispense Refill   albuterol (VENTOLIN HFA) 108 (90 Base) MCG/ACT inhaler Inhale 2 puffs into the lungs every 6 (six) hours as needed for wheezing or shortness of breath. 1 each 11   ALPRAZolam (XANAX) 0.5 MG tablet Take 1 tablet (0.5 mg total) by mouth 2 (two) times daily as needed for anxiety. (Patient not taking: Reported on 12/13/2021) 15 tablet 0   amLODipine (NORVASC) 10 MG tablet Take 1 tablet (10 mg total) by mouth daily. 30 tablet 2   aspirin 325 MG EC tablet Take 1 tablet (325 mg total) by mouth daily. 30 tablet 11   atorvastatin (LIPITOR) 10 MG tablet TAKE 1 TABLET (10 MG TOTAL) BY MOUTH DAILY. 30 tablet 11   fluticasone-salmeterol (ADVAIR) 100-50 MCG/ACT AEPB Inhale 1 puff into the lungs 2 (two) times daily. (Patient not taking: Reported on 11/30/2021) 1 each 3   furosemide (  LASIX) 20 MG tablet TAKE 1 TABLET (20 MG TOTAL) BY MOUTH 2 (TWO) TIMES DAILY AS NEEDED. 60 tablet 11   glipiZIDE (GLUCOTROL) 10 MG tablet TAKE 1 TABLET (10 MG TOTAL) BY MOUTH 2 (TWO) TIMES DAILY BEFORE A MEAL. 180 tablet 3   hydrochlorothiazide (HYDRODIURIL) 25 MG tablet Take 1 tablet (25 mg total) by mouth daily. 30 tablet 2   insulin glargine (LANTUS) 100 UNIT/ML injection Inject 0.24 mLs (24 Units total) into the skin at bedtime. (Patient not taking: Reported on 11/30/2021) 10 mL 3   Insulin Pen Needle (BD ULTRA-FINE PEN NEEDLES) 29G X 12.7MM MISC ICD10 E11.9 for use with insulin administration (Patient not taking: Reported on 11/30/2021) 200 each 11   Insulin Syringes, Disposable, U-100 0.3 ML MISC 1 each by Does not apply route at bedtime. (Patient not taking: Reported on 12/13/2021) 100 each 11   lisinopril  (ZESTRIL) 40 MG tablet TAKE 1 TABLET (40 MG TOTAL) BY MOUTH DAILY. 30 tablet 2   metFORMIN (GLUCOPHAGE) 1000 MG tablet TAKE 1 TABLET (1,000 MG TOTAL) BY MOUTH 2 (TWO) TIMES DAILY WITH A MEAL. 60 tablet 11   nicotine (NICODERM CQ - DOSED IN MG/24 HOURS) 14 mg/24hr patch Place 1 patch (14 mg total) onto the skin daily. (Patient not taking: Reported on 11/30/2021) 28 patch 0   omeprazole (PRILOSEC) 40 MG capsule TAKE 1 CAPSULE (40 MG TOTAL) BY MOUTH DAILY. 30 capsule 11   thiamine 100 MG tablet Take 1 tablet (100 mg total) by mouth daily. (Patient not taking: Reported on 12/13/2021) 30 tablet 11   Tiotropium Bromide Monohydrate (SPIRIVA RESPIMAT) 2.5 MCG/ACT AERS Inhale 2 puffs into the lungs daily. 4 g 0   traZODone (DESYREL) 100 MG tablet TAKE 1 TABLET (100 MG TOTAL) BY MOUTH AT BEDTIME AS NEEDED FOR SLEEP. (Patient not taking: Reported on 11/30/2021) 30 tablet 11   No facility-administered medications prior to visit.    Review of Systems  Constitutional:  Positive for malaise/fatigue. Negative for chills, diaphoresis, fever and weight loss.  HENT:  Negative for congestion.   Respiratory:  Positive for cough, sputum production, shortness of breath and wheezing. Negative for hemoptysis.   Cardiovascular:  Negative for chest pain, palpitations and leg swelling.    Objective:   There were no vitals filed for this visit.     Physical Exam: General: Well-appearing, no acute distress HENT: [place], AT Eyes: EOMI, no scleral icterus Respiratory: Clear to auscultation bilaterally.  No crackles, wheezing or rales Cardiovascular: RRR, -M/R/G, no JVD Extremities:-Edema,-tenderness Neuro: AAO x4, CNII-XII grossly intact Psych: Normal mood, normal affect  Data Reviewed:  Imaging: CXR 12/15/17 - No infiltrate, effusion, edema CXR 12/13/2021-normal chest x-ray with no infiltrate, effusion or edema  PFT: None on file  Labs: CBC    Component Value Date/Time   WBC 4.6 11/30/2021 1111   WBC 4.5  12/16/2017 0348   RBC 4.00 11/30/2021 1111   RBC 4.33 12/16/2017 0348   HGB 10.8 (L) 11/30/2021 1111   HCT 33.4 (L) 11/30/2021 1111   PLT 244 11/30/2021 1111   MCV 84 11/30/2021 1111   MCH 27.0 11/30/2021 1111   MCH 29.1 12/16/2017 0348   MCHC 32.3 11/30/2021 1111   MCHC 33.4 12/16/2017 0348   RDW 15.2 11/30/2021 1111   LYMPHSABS 1.4 11/30/2021 1111   MONOABS 0.3 12/15/2017 1329   EOSABS 0.1 11/30/2021 1111   BASOSABS 0.0 11/30/2021 1111   Absolute 12/13/21-100     Assessment & Plan:   Discussion: 51 year old female  active smoker with hypertension, CVA, diabetes who presents for evaluation of cough.  In the setting of active tobacco use and concerned for COPD driving her symptoms.  She does have history of mold exposure and + rhodotorula mucilaginosa. Discussed findings with Infectious Disease and +culture likely contaminant without clinical significance or indication for treatment especially since >6 months. In clinic she is nontoxic-appearing and chest XR without infiltrate. Of noteshe currently does not have insurance and is under financial constraints.  51 year old female active smoker with HTN, CVA, DM2 who presents for follow-up. LAMA ***  Chronic bronchitis/Suspected COPD --START Spiriva 2.5 mcg TWO puffs ONCE a day --START Albuterol AS NEEDED for shortness of breath or wheezing --Will order PFTs in the future  Health Maintenance Immunization History  Administered Date(s) Administered   Influenza Whole 06/21/2011   Influenza,inj,Quad PF,6+ Mos 09/06/2016, 08/17/2018, 08/25/2019, 06/29/2021   PFIZER Comirnaty(Gray Top)Covid-19 Tri-Sucrose Vaccine 01/15/2020, 02/09/2020   Pneumococcal Conjugate-13 08/25/2019   Pneumococcal Polysaccharide-23 12/06/2016   Pneumococcal-Unspecified 06/21/2011   Tdap 12/06/2016   CT Lung Screen - not qualified due to age  No orders of the defined types were placed in this encounter.  No orders of the defined types were placed in this  encounter.   No follow-ups on file.  I have spent a total time of 45-minutes on the day of the appointment reviewing prior documentation, coordinating care and discussing medical diagnosis and plan with the patient/family. Imaging, labs and tests included in this note have been reviewed and interpreted independently by me.  Zorina Mallin Mechele Collin, MD [facility] 01/24/2022 8:56 AM  Office Number [phone]

## 2022-01-28 ENCOUNTER — Other Ambulatory Visit: Payer: Self-pay | Admitting: Nurse Practitioner

## 2022-01-28 ENCOUNTER — Telehealth: Payer: Self-pay | Admitting: Clinical

## 2022-01-28 DIAGNOSIS — I10 Essential (primary) hypertension: Secondary | ICD-10-CM

## 2022-01-28 DIAGNOSIS — K219 Gastro-esophageal reflux disease without esophagitis: Secondary | ICD-10-CM

## 2022-01-28 DIAGNOSIS — E119 Type 2 diabetes mellitus without complications: Secondary | ICD-10-CM

## 2022-01-28 DIAGNOSIS — Z760 Encounter for issue of repeat prescription: Secondary | ICD-10-CM

## 2022-01-28 MED ORDER — OMEPRAZOLE 40 MG PO CPDR
DELAYED_RELEASE_CAPSULE | Freq: Every day | ORAL | 11 refills | Status: DC
Start: 2022-01-28 — End: 2022-11-16

## 2022-01-28 MED ORDER — AMLODIPINE BESYLATE 10 MG PO TABS: 10.0000 mg | ORAL_TABLET | Freq: Every day | ORAL | 2 refills | Status: DC

## 2022-01-28 MED ORDER — ATORVASTATIN CALCIUM 10 MG PO TABS
ORAL_TABLET | Freq: Every day | ORAL | 11 refills | Status: DC
Start: 2022-01-28 — End: 2023-01-30

## 2022-01-28 NOTE — Telephone Encounter (Signed)
[facility] ?General Follow Up Note ? ?01/28/2022 ?Name: Misty Mccullough MRN: [id number] DOB: 1971/08/06 ?Misty Mccullough is a 51 y.o. year old female who sees [place], En[place] Derry I, NP for primary care. LCSW was initially consulted to assist with financial difficulties.  ? ?Interpreter: No.   Interpreter Name & Language: none ? ?Assessment: Patient experiencing financial difficulties due to no income.  ? ?Ongoing Intervention: Today CSW called patient to follow up on Med[employer]ist application. Patient reported that she now has health insurance. She reported she has [facility] Medicaid. Advised patient she will not need to apply for MedAssist then; her medications will be covered by insurance.  ? ?Patient has not yet received her insurance card in the mail and so is having problems getting her medications filled at the pharmacy. Advised patient of how to create a login on [facility] Medicaid website to access her insurance card information. Patient needs refills of three of her medications; sent this request to PCP.  ? ?Review of patient status, including review of consultants reports, relevant laboratory and other test results, and collaboration with appropriate care team members and the patient's provider was performed as part of comprehensive patient evaluation and provision of services.   ? ?Abigail Butts, LCSW ?[facility] ?[phone] ?  ? ?

## 2022-01-29 ENCOUNTER — Other Ambulatory Visit: Payer: Self-pay | Admitting: Nurse Practitioner

## 2022-01-29 DIAGNOSIS — R600 Localized edema: Secondary | ICD-10-CM

## 2022-01-29 DIAGNOSIS — Z760 Encounter for issue of repeat prescription: Secondary | ICD-10-CM

## 2022-01-29 MED ORDER — FUROSEMIDE 20 MG PO TABS: ORAL_TABLET | ORAL | 11 refills | Status: DC

## 2022-02-06 ENCOUNTER — Telehealth: Payer: Self-pay

## 2022-02-06 ENCOUNTER — Other Ambulatory Visit: Payer: Self-pay

## 2022-02-06 DIAGNOSIS — Z760 Encounter for issue of repeat prescription: Secondary | ICD-10-CM

## 2022-02-06 DIAGNOSIS — E119 Type 2 diabetes mellitus without complications: Secondary | ICD-10-CM

## 2022-02-06 MED ORDER — METFORMIN HCL 1000 MG PO TABS: ORAL_TABLET | Freq: Two times a day (BID) | ORAL | 11 refills | Status: DC

## 2022-02-06 NOTE — Telephone Encounter (Signed)
Metformin ? ? ?[facility] in [place] ? ?

## 2022-02-06 NOTE — Telephone Encounter (Signed)
Refill request has been sent to the provider for review. ?

## 2022-02-27 ENCOUNTER — Ambulatory Visit (INDEPENDENT_AMBULATORY_CARE_PROVIDER_SITE_OTHER): Payer: Medicaid Other | Admitting: Nurse Practitioner

## 2022-02-27 ENCOUNTER — Encounter: Payer: Self-pay | Admitting: Nurse Practitioner

## 2022-02-27 VITALS — BP 119/67 | HR 91 | Temp 97.7°F | Ht 64.0 in | Wt 147.0 lb

## 2022-02-27 DIAGNOSIS — Z760 Encounter for issue of repeat prescription: Secondary | ICD-10-CM

## 2022-02-27 DIAGNOSIS — E119 Type 2 diabetes mellitus without complications: Secondary | ICD-10-CM | POA: Diagnosis not present

## 2022-02-27 DIAGNOSIS — Z720 Tobacco use: Secondary | ICD-10-CM | POA: Diagnosis not present

## 2022-02-27 DIAGNOSIS — R82998 Other abnormal findings in urine: Secondary | ICD-10-CM | POA: Diagnosis not present

## 2022-02-27 DIAGNOSIS — I10 Essential (primary) hypertension: Secondary | ICD-10-CM

## 2022-02-27 LAB — POCT URINALYSIS DIP (CLINITEK)
Bilirubin, UA: NEGATIVE
Blood, UA: NEGATIVE
Glucose, UA: NEGATIVE mg/dL
Ketones, POC UA: NEGATIVE mg/dL
Nitrite, UA: NEGATIVE
POC PROTEIN,UA: NEGATIVE
Spec Grav, UA: 1.02 (ref 1.010–1.025)
Urobilinogen, UA: 0.2 E.U./dL
pH, UA: 5.5 (ref 5.0–8.0)

## 2022-02-27 LAB — POCT GLYCOSYLATED HEMOGLOBIN (HGB A1C)
HbA1c POC (<> result, manual entry): 10.3 % (ref 4.0–5.6)
HbA1c, POC (controlled diabetic range): 10.3 % — AB (ref 0.0–7.0)
HbA1c, POC (prediabetic range): 10.3 % — AB (ref 5.7–6.4)
Hemoglobin A1C: 10.3 % — AB (ref 4.0–5.6)

## 2022-02-27 MED ORDER — GLIPIZIDE 10 MG PO TABS: ORAL_TABLET | Freq: Two times a day (BID) | ORAL | 3 refills | Status: DC

## 2022-02-27 MED ORDER — INSULIN GLARGINE 100 UNIT/ML SOLOSTAR PEN: 34.0000 [IU] | PEN_INJECTOR | Freq: Every day | SUBCUTANEOUS | 0 refills | Status: DC

## 2022-02-27 MED ORDER — METFORMIN HCL 1000 MG PO TABS: ORAL_TABLET | Freq: Two times a day (BID) | ORAL | 11 refills | Status: DC

## 2022-02-27 NOTE — Progress Notes (Signed)
[address], [place]  [address] Phone:  [phone]   Fax:  [phone] Subjective:   Patient ID: Misty Mccullough, female    DOB: [birth year], 51 y.o.   MRN: [id number]  Chief Complaint  Patient presents with   Follow-up    3 month follow up; Patient has no issues to address today.   HPI Misty Mccullough 51 y.o. female  has a past medical history of Bilateral lower extremity edema, Cough, Diabetes mellitus without complication ([place]), Gallstones, Heavy cigarette smoker, Hemoglobin A1C between 7% and 9% indicating borderline diabetic control ([place]) (08/2019), Hypercholesteremia, Hyperglycemia, Hypertension, Shortness of breath, and Vitamin D deficiency (12/2020). To the [facility] for follow up visit.   States that she completed follow up with pulmonology and is not taking Spiriva, which has been effective in managing cough. Denies any chest pain or shortness of breath. Cigarette usage decreased, now she smokes one PPD instead of two PPD.   Diabetes Mellitus: Patient presents for follow up of diabetes. Symptoms: none. Denies any symptoms. Patient denies none.  Evaluation to date has been included: hemoglobin A1C.  Home sugars: patient does not check sugars. Treatment to date: no recent interventions.   Currently continues to reside with her daughter. Since last visit, flatulence subsided after she quit using straws. Attempted using OTC medications with no improvement in symptoms. Excited about new grandchild, daughter is pregnant.  Denies any other concerns today.   Denies any fatigue, chest pain, shortness of breath, HA or dizziness. Denies any blurred vision, numbness or tingling.  Past Medical History:  Diagnosis Date   Bilateral lower extremity edema    Cough    Diabetes mellitus without complication ([place])    Gallstones    Heavy cigarette smoker    Hemoglobin A1C between 7% and 9% indicating borderline diabetic control ([place]) 08/2019    Hypercholesteremia    Hyperglycemia    Hypertension    Shortness of breath    Vitamin D deficiency 12/2020    Past Surgical History:  Procedure Laterality Date   TUBAL LIGATION      Family History  Problem Relation Age of Onset   Diabetes Mother    Uterine cancer Mother    Diabetes Father    Heart attack Father 80   Stroke Father    Heart attack Paternal Grandmother    Heart attack Paternal Grandfather    Stroke Sister    Heart attack Paternal Uncle    Bone cancer Paternal Uncle     Social History   Socioeconomic History   Marital status: Married    Spouse name: Not on file   Number of children: Not on file   Years of education: Not on file   Highest education level: Not on file  Occupational History   Not on file  Tobacco Use   Smoking status: Every Day    Packs/day: 1.00    Years: 32.00    Pack years: 32.00    Types: Cigarettes   Smokeless tobacco: Never   Tobacco comments:    Since age 21.  Vaping Use   Vaping Use: Never used  Substance and Sexual Activity   Alcohol use: Not Currently    Comment: Occasional   Drug use: No   Sexual activity: Yes    Partners: Male  Other Topics Concern   Not on file  Social History Narrative   Not on file   Social Determinants of Health   Financial Resource Strain:  Not on file  Food Insecurity: Not on file  Transportation Needs: Not on file  Physical Activity: Not on file  Stress: Not on file  Social Connections: Not on file  Intimate Partner Violence: Not on file    Outpatient Medications Prior to Visit  Medication Sig Dispense Refill   albuterol (VENTOLIN HFA) 108 (90 Base) MCG/ACT inhaler Inhale 2 puffs into the lungs every 6 (six) hours as needed for wheezing or shortness of breath. 1 each 11   ALPRAZolam (XANAX) 0.5 MG tablet Take 1 tablet (0.5 mg total) by mouth 2 (two) times daily as needed for anxiety. 15 tablet 0   amLODipine (NORVASC) 10 MG tablet Take 1 tablet (10 mg total) by mouth daily. 30 tablet  2   aspirin 325 MG EC tablet Take 1 tablet (325 mg total) by mouth daily. 30 tablet 11   atorvastatin (LIPITOR) 10 MG tablet TAKE 1 TABLET (10 MG TOTAL) BY MOUTH DAILY. 30 tablet 11   fluticasone-salmeterol (ADVAIR) 100-50 MCG/ACT AEPB Inhale 1 puff into the lungs 2 (two) times daily. 1 each 3   furosemide (LASIX) 20 MG tablet TAKE 1 TABLET (20 MG TOTAL) BY MOUTH 2 (TWO) TIMES DAILY AS NEEDED. 60 tablet 11   hydrochlorothiazide (HYDRODIURIL) 25 MG tablet Take 1 tablet (25 mg total) by mouth daily. 30 tablet 2   lisinopril (ZESTRIL) 40 MG tablet TAKE 1 TABLET (40 MG TOTAL) BY MOUTH DAILY. 30 tablet 2   omeprazole (PRILOSEC) 40 MG capsule TAKE 1 CAPSULE (40 MG TOTAL) BY MOUTH DAILY. 30 capsule 11   Tiotropium Bromide Monohydrate (SPIRIVA RESPIMAT) 2.5 MCG/ACT AERS Inhale 2 puffs into the lungs daily. 4 g 0   metFORMIN (GLUCOPHAGE) 1000 MG tablet TAKE 1 TABLET (1,000 MG TOTAL) BY MOUTH 2 (TWO) TIMES DAILY WITH A MEAL. 60 tablet 11   Insulin Pen Needle (BD ULTRA-FINE PEN NEEDLES) 29G X 12.7MM MISC ICD10 E11.9 for use with insulin administration (Patient not taking: Reported on 11/30/2021) 200 each 11   Insulin Syringes, Disposable, U-100 0.3 ML MISC 1 each by Does not apply route at bedtime. (Patient not taking: Reported on 12/13/2021) 100 each 11   nicotine (NICODERM CQ - DOSED IN MG/24 HOURS) 14 mg/24hr patch Place 1 patch (14 mg total) onto the skin daily. (Patient not taking: Reported on 11/30/2021) 28 patch 0   thiamine 100 MG tablet Take 1 tablet (100 mg total) by mouth daily. (Patient not taking: Reported on 12/13/2021) 30 tablet 11   traZODone (DESYREL) 100 MG tablet TAKE 1 TABLET (100 MG TOTAL) BY MOUTH AT BEDTIME AS NEEDED FOR SLEEP. (Patient not taking: Reported on 11/30/2021) 30 tablet 11   glipiZIDE (GLUCOTROL) 10 MG tablet TAKE 1 TABLET (10 MG TOTAL) BY MOUTH 2 (TWO) TIMES DAILY BEFORE A MEAL. 180 tablet 3   insulin glargine (LANTUS) 100 UNIT/ML injection Inject 0.24 mLs (24 Units total) into the  skin at bedtime. (Patient not taking: Reported on 11/30/2021) 10 mL 3   No facility-administered medications prior to visit.    No Known Allergies  Review of Systems  Constitutional:  Negative for chills, fever and malaise/fatigue.  HENT: Negative.    Eyes: Negative.   Respiratory:  Negative for cough and shortness of breath.   Cardiovascular:  Negative for chest pain, palpitations and leg swelling.  Gastrointestinal:  Negative for abdominal pain, blood in stool, constipation, diarrhea, nausea and vomiting.  Musculoskeletal: Negative.   Skin: Negative.   Neurological: Negative.   Psychiatric/Behavioral:  Negative for depression.  The patient is not nervous/anxious.   All other systems reviewed and are negative.     Objective:    Physical Exam Vitals reviewed.  Constitutional:      General: She is not in acute distress.    Appearance: Normal appearance. She is normal weight.  HENT:     Head: Normocephalic.  Neck:     Vascular: No carotid bruit.  Cardiovascular:     Rate and Rhythm: Normal rate and regular rhythm.     Pulses: Normal pulses.     Heart sounds: Normal heart sounds.     Comments: No obvious peripheral edema Pulmonary:     Effort: Pulmonary effort is normal.     Breath sounds: Normal breath sounds.  Musculoskeletal:        General: No swelling, tenderness, deformity or signs of injury. Normal range of motion.     Cervical back: Normal range of motion and neck supple. No rigidity or tenderness.     Right lower leg: No edema.     Left lower leg: No edema.  Lymphadenopathy:     Cervical: No cervical adenopathy.  Skin:    General: Skin is warm and dry.     Capillary Refill: Capillary refill takes less than 2 seconds.  Neurological:     General: No focal deficit present.     Mental Status: She is alert and oriented to person, place, and time.  Psychiatric:        Mood and Affect: Mood normal.        Behavior: Behavior normal.        Thought Content: Thought  content normal.        Judgment: Judgment normal.    BP 119/67   Pulse 91   Temp 97.7 F (36.5 C)   Ht 5' 4"  (1.626 m)   Wt 147 lb (66.7 kg)   SpO2 99%   BMI 25.23 kg/m  Wt Readings from Last 3 Encounters:  02/27/22 147 lb (66.7 kg)  12/13/21 148 lb 3.2 oz (67.2 kg)  11/30/21 155 lb 0.8 oz (70.3 kg)    Immunization History  Administered Date(s) Administered   Influenza Whole 06/21/2011   Influenza,inj,Quad PF,6+ Mos 09/06/2016, 08/17/2018, 08/25/2019, 06/29/2021   PFIZER Comirnaty(Gray Top)Covid-19 Tri-Sucrose Vaccine 01/15/2020, 02/09/2020   Pneumococcal Conjugate-13 08/25/2019   Pneumococcal Polysaccharide-23 12/06/2016   Pneumococcal-Unspecified 06/21/2011   Tdap 12/06/2016    Diabetic Foot Exam - Simple   No data filed     Lab Results  Component Value Date   TSH 0.980 01/01/2021   Lab Results  Component Value Date   WBC 4.6 11/30/2021   HGB 10.8 (L) 11/30/2021   HCT 33.4 (L) 11/30/2021   MCV 84 11/30/2021   PLT 244 11/30/2021   Lab Results  Component Value Date   NA 145 (H) 11/30/2021   K 4.3 11/30/2021   CO2 23 11/30/2021   GLUCOSE 81 11/30/2021   BUN 8 11/30/2021   CREATININE 0.66 11/30/2021   BILITOT 0.2 11/30/2021   ALKPHOS 82 11/30/2021   AST 24 11/30/2021   ALT 20 11/30/2021   PROT 6.6 11/30/2021   ALBUMIN 4.0 11/30/2021   CALCIUM 9.3 11/30/2021   ANIONGAP 7 12/16/2017   EGFR 105 11/30/2021   Lab Results  Component Value Date   CHOL 141 06/29/2021   CHOL 120 01/01/2021   CHOL 106 04/21/2019   Lab Results  Component Value Date   HDL 34 (L) 06/29/2021   HDL 34 (L) 01/01/2021  HDL 34 (L) 04/21/2019   Lab Results  Component Value Date   LDLCALC 91 06/29/2021   LDLCALC 67 01/01/2021   LDLCALC 60 04/21/2019   Lab Results  Component Value Date   TRIG 82 06/29/2021   TRIG 99 01/01/2021   TRIG 59 04/21/2019   Lab Results  Component Value Date   CHOLHDL 4.1 06/29/2021   CHOLHDL 3.5 01/01/2021   CHOLHDL 3.1 04/21/2019    Lab Results  Component Value Date   HGBA1C 10.3 (A) 02/27/2022   HGBA1C 10.3 02/27/2022   HGBA1C 10.3 (A) 02/27/2022   HGBA1C 10.3 (A) 02/27/2022       Assessment & Plan:   Problem List Items Addressed This Visit       Cardiovascular and Mediastinum   Essential hypertension Encouraged continued diet and exercise efforts  Encouraged continued compliance with medication  Encouraged continued tobacco cessation efforts     Endocrine   Type 2 diabetes mellitus without complication, without long-term current use of insulin (HCC) - Primary   Relevant Medications   metFORMIN (GLUCOPHAGE) 1000 MG tablet   glipiZIDE (GLUCOTROL) 10 MG tablet   insulin glargine (LANTUS) 100 UNIT/ML Solostar Pen, dosage increased to 34 units Encouraged continued diet and exercise efforts  Encouraged continued compliance with medication     Other Relevant Orders   HgB A1c (Completed): 10.3, improved since previous visit   POCT URINALYSIS DIP (CLINITEK) (Completed)   Other Visit Diagnoses     Leukocytes in urine       Relevant Orders   Urine Culture   Tobacco abuse     Encouraged to continued tobacco cessation efforts New goal, down to 1/1 PPD   Medication refill       Relevant Medications   metFORMIN (GLUCOPHAGE) 1000 MG tablet   glipiZIDE (GLUCOTROL) 10 MG tablet   insulin glargine (LANTUS) 100 UNIT/ML Solostar Pen Medications refilled, lantus dosage changed   Follow up in 6 mths for reevaluation of DM, tobacco cessation and HTN, sooner as needed     I have discontinued Misty Mccullough's insulin glargine. I am also having her start on insulin glargine. Additionally, I am having her maintain her Insulin Syringes (Disposable), aspirin EC, BD ULTRA-FINE PEN NEEDLES, thiamine, traZODone, nicotine, fluticasone-salmeterol, ALPRAZolam, hydrochlorothiazide, albuterol, Spiriva Respimat, lisinopril, atorvastatin, omeprazole, amLODipine, furosemide, metFORMIN, and glipiZIDE.  Meds ordered this encounter   Medications   metFORMIN (GLUCOPHAGE) 1000 MG tablet    Sig: TAKE 1 TABLET (1,000 MG TOTAL) BY MOUTH 2 (TWO) TIMES DAILY WITH A MEAL.    Dispense:  60 tablet    Refill:  11   glipiZIDE (GLUCOTROL) 10 MG tablet    Sig: TAKE 1 TABLET (10 MG TOTAL) BY MOUTH 2 (TWO) TIMES DAILY BEFORE A MEAL.    Dispense:  180 tablet    Refill:  3   insulin glargine (LANTUS) 100 UNIT/ML Solostar Pen    Sig: Inject 34 Units into the skin at bedtime.    Dispense:  30.6 mL    Refill:  0     Teena Dunk, NP

## 2022-02-27 NOTE — Patient Instructions (Addendum)
You were seen today in the [facility] for reevaluation of HTN and DM. Labs were collected, results will be available via MyChart or, if abnormal, you will be contacted by clinic staff. You were prescribed medications, please take as directed. Please follow up in 6 mths for reevaluation of DM, tobacco cessation and HTN

## 2022-03-01 ENCOUNTER — Other Ambulatory Visit: Payer: Self-pay | Admitting: Nurse Practitioner

## 2022-03-01 DIAGNOSIS — N390 Urinary tract infection, site not specified: Secondary | ICD-10-CM

## 2022-03-01 MED ORDER — CEPHALEXIN 500 MG PO CAPS: 500.0000 mg | ORAL_CAPSULE | Freq: Four times a day (QID) | ORAL | 0 refills | Status: AC

## 2022-03-02 LAB — URINE CULTURE

## 2022-03-07 ENCOUNTER — Telehealth: Payer: Self-pay

## 2022-03-07 NOTE — Telephone Encounter (Signed)
Patient is requesting new blood sugar meter, lancets and strips hers no longer works, she was unable to tell me which on she used she threw it out. Rx to [facility] in [place].

## 2022-03-08 ENCOUNTER — Other Ambulatory Visit: Payer: Self-pay | Admitting: Nurse Practitioner

## 2022-03-08 DIAGNOSIS — E119 Type 2 diabetes mellitus without complications: Secondary | ICD-10-CM

## 2022-03-08 MED ORDER — ONETOUCH ULTRASOFT LANCETS MISC: 12 refills | Status: DC

## 2022-03-08 MED ORDER — BLOOD GLUCOSE MONITOR KIT: PACK | 1 refills | Status: DC

## 2022-03-21 ENCOUNTER — Other Ambulatory Visit: Payer: Self-pay

## 2022-03-21 DIAGNOSIS — Z760 Encounter for issue of repeat prescription: Secondary | ICD-10-CM

## 2022-03-21 DIAGNOSIS — I10 Essential (primary) hypertension: Secondary | ICD-10-CM

## 2022-03-21 MED ORDER — LISINOPRIL 40 MG PO TABS: ORAL_TABLET | Freq: Every day | ORAL | 3 refills | Status: DC

## 2022-04-18 DIAGNOSIS — H5213 Myopia, bilateral: Secondary | ICD-10-CM | POA: Diagnosis not present

## 2022-04-25 DIAGNOSIS — H5213 Myopia, bilateral: Secondary | ICD-10-CM | POA: Diagnosis not present

## 2022-07-04 ENCOUNTER — Other Ambulatory Visit: Payer: Self-pay | Admitting: Nurse Practitioner

## 2022-07-04 DIAGNOSIS — I10 Essential (primary) hypertension: Secondary | ICD-10-CM

## 2022-07-07 ENCOUNTER — Other Ambulatory Visit: Payer: Self-pay | Admitting: Nurse Practitioner

## 2022-07-07 DIAGNOSIS — I10 Essential (primary) hypertension: Secondary | ICD-10-CM

## 2022-07-07 DIAGNOSIS — Z760 Encounter for issue of repeat prescription: Secondary | ICD-10-CM

## 2022-08-05 ENCOUNTER — Other Ambulatory Visit: Payer: Self-pay | Admitting: Nurse Practitioner

## 2022-08-05 DIAGNOSIS — Z760 Encounter for issue of repeat prescription: Secondary | ICD-10-CM

## 2022-08-05 DIAGNOSIS — I10 Essential (primary) hypertension: Secondary | ICD-10-CM

## 2022-08-19 ENCOUNTER — Telehealth: Payer: Self-pay | Admitting: Nurse Practitioner

## 2022-08-19 ENCOUNTER — Other Ambulatory Visit: Payer: Self-pay

## 2022-08-19 DIAGNOSIS — Z760 Encounter for issue of repeat prescription: Secondary | ICD-10-CM

## 2022-08-19 DIAGNOSIS — I10 Essential (primary) hypertension: Secondary | ICD-10-CM

## 2022-08-19 MED ORDER — LISINOPRIL 40 MG PO TABS: 40.0000 mg | ORAL_TABLET | Freq: Every day | ORAL | 0 refills | Status: DC

## 2022-08-19 NOTE — Telephone Encounter (Signed)
Liposol (blood pressure meds) refill request

## 2022-08-19 NOTE — Telephone Encounter (Signed)
Done Kh 

## 2022-08-30 ENCOUNTER — Ambulatory Visit: Payer: Medicaid Other | Admitting: Nurse Practitioner

## 2022-09-05 ENCOUNTER — Ambulatory Visit: Payer: Medicaid Other | Admitting: Nurse Practitioner

## 2022-11-14 ENCOUNTER — Encounter (HOSPITAL_COMMUNITY): Payer: Self-pay | Admitting: *Deleted

## 2022-11-14 ENCOUNTER — Other Ambulatory Visit: Payer: Self-pay

## 2022-11-14 ENCOUNTER — Inpatient Hospital Stay (HOSPITAL_COMMUNITY)
Admission: EM | Admit: 2022-11-14 | Discharge: 2022-11-16 | DRG: 291 | Disposition: A | Discharge status: Home / Self Care | Payer: Medicaid Other | Attending: Family Medicine | Admitting: Family Medicine

## 2022-11-14 ENCOUNTER — Emergency Department (HOSPITAL_COMMUNITY): Payer: Medicaid Other

## 2022-11-14 DIAGNOSIS — J449 Chronic obstructive pulmonary disease, unspecified: Secondary | ICD-10-CM | POA: Diagnosis present

## 2022-11-14 DIAGNOSIS — I509 Heart failure, unspecified: Secondary | ICD-10-CM | POA: Diagnosis not present

## 2022-11-14 DIAGNOSIS — I110 Hypertensive heart disease with heart failure: Principal | ICD-10-CM | POA: Diagnosis present

## 2022-11-14 DIAGNOSIS — J42 Unspecified chronic bronchitis: Secondary | ICD-10-CM | POA: Diagnosis not present

## 2022-11-14 DIAGNOSIS — E118 Type 2 diabetes mellitus with unspecified complications: Secondary | ICD-10-CM | POA: Diagnosis present

## 2022-11-14 DIAGNOSIS — Z8249 Family history of ischemic heart disease and other diseases of the circulatory system: Secondary | ICD-10-CM

## 2022-11-14 DIAGNOSIS — D649 Anemia, unspecified: Secondary | ICD-10-CM | POA: Diagnosis present

## 2022-11-14 DIAGNOSIS — I10 Essential (primary) hypertension: Secondary | ICD-10-CM | POA: Diagnosis not present

## 2022-11-14 DIAGNOSIS — Z823 Family history of stroke: Secondary | ICD-10-CM

## 2022-11-14 DIAGNOSIS — I5032 Chronic diastolic (congestive) heart failure: Secondary | ICD-10-CM | POA: Diagnosis present

## 2022-11-14 DIAGNOSIS — E119 Type 2 diabetes mellitus without complications: Secondary | ICD-10-CM | POA: Diagnosis not present

## 2022-11-14 DIAGNOSIS — R0609 Other forms of dyspnea: Secondary | ICD-10-CM | POA: Diagnosis not present

## 2022-11-14 DIAGNOSIS — Z8049 Family history of malignant neoplasm of other genital organs: Secondary | ICD-10-CM

## 2022-11-14 DIAGNOSIS — F17210 Nicotine dependence, cigarettes, uncomplicated: Secondary | ICD-10-CM | POA: Diagnosis present

## 2022-11-14 DIAGNOSIS — Z8673 Personal history of transient ischemic attack (TIA), and cerebral infarction without residual deficits: Secondary | ICD-10-CM

## 2022-11-14 DIAGNOSIS — Z7984 Long term (current) use of oral hypoglycemic drugs: Secondary | ICD-10-CM | POA: Diagnosis not present

## 2022-11-14 DIAGNOSIS — R079 Chest pain, unspecified: Secondary | ICD-10-CM | POA: Diagnosis not present

## 2022-11-14 DIAGNOSIS — E7800 Pure hypercholesterolemia, unspecified: Secondary | ICD-10-CM | POA: Diagnosis present

## 2022-11-14 DIAGNOSIS — Z23 Encounter for immunization: Secondary | ICD-10-CM

## 2022-11-14 DIAGNOSIS — J9811 Atelectasis: Secondary | ICD-10-CM | POA: Diagnosis not present

## 2022-11-14 DIAGNOSIS — F17200 Nicotine dependence, unspecified, uncomplicated: Secondary | ICD-10-CM | POA: Diagnosis not present

## 2022-11-14 DIAGNOSIS — R0602 Shortness of breath: Secondary | ICD-10-CM | POA: Diagnosis not present

## 2022-11-14 DIAGNOSIS — R059 Cough, unspecified: Secondary | ICD-10-CM | POA: Diagnosis not present

## 2022-11-14 DIAGNOSIS — Z7982 Long term (current) use of aspirin: Secondary | ICD-10-CM

## 2022-11-14 DIAGNOSIS — Z794 Long term (current) use of insulin: Secondary | ICD-10-CM

## 2022-11-14 DIAGNOSIS — Z79899 Other long term (current) drug therapy: Secondary | ICD-10-CM | POA: Diagnosis not present

## 2022-11-14 DIAGNOSIS — E1165 Type 2 diabetes mellitus with hyperglycemia: Secondary | ICD-10-CM | POA: Diagnosis present

## 2022-11-14 DIAGNOSIS — F419 Anxiety disorder, unspecified: Secondary | ICD-10-CM | POA: Diagnosis present

## 2022-11-14 DIAGNOSIS — Z720 Tobacco use: Secondary | ICD-10-CM | POA: Diagnosis present

## 2022-11-14 DIAGNOSIS — R600 Localized edema: Secondary | ICD-10-CM

## 2022-11-14 DIAGNOSIS — J9601 Acute respiratory failure with hypoxia: Secondary | ICD-10-CM | POA: Diagnosis present

## 2022-11-14 DIAGNOSIS — Z760 Encounter for issue of repeat prescription: Secondary | ICD-10-CM

## 2022-11-14 DIAGNOSIS — I5033 Acute on chronic diastolic (congestive) heart failure: Secondary | ICD-10-CM | POA: Diagnosis present

## 2022-11-14 DIAGNOSIS — Z7951 Long term (current) use of inhaled steroids: Secondary | ICD-10-CM | POA: Diagnosis not present

## 2022-11-14 DIAGNOSIS — K219 Gastro-esophageal reflux disease without esophagitis: Secondary | ICD-10-CM | POA: Diagnosis present

## 2022-11-14 DIAGNOSIS — Z1152 Encounter for screening for COVID-19: Secondary | ICD-10-CM | POA: Diagnosis not present

## 2022-11-14 DIAGNOSIS — E785 Hyperlipidemia, unspecified: Secondary | ICD-10-CM | POA: Diagnosis present

## 2022-11-14 DIAGNOSIS — Z833 Family history of diabetes mellitus: Secondary | ICD-10-CM

## 2022-11-14 LAB — RESP PANEL BY RT-PCR (RSV, FLU A&B, COVID)  RVPGX2
Influenza A by PCR: NEGATIVE
Influenza B by PCR: NEGATIVE
Resp Syncytial Virus by PCR: NEGATIVE
SARS Coronavirus 2 by RT PCR: NEGATIVE

## 2022-11-14 LAB — URINALYSIS, ROUTINE W REFLEX MICROSCOPIC
Bacteria, UA: NONE SEEN
Bilirubin Urine: NEGATIVE
Glucose, UA: NEGATIVE mg/dL
Ketones, ur: NEGATIVE mg/dL
Leukocytes,Ua: NEGATIVE
Nitrite: NEGATIVE
Protein, ur: 100 mg/dL — AB
Specific Gravity, Urine: 1.003 — ABNORMAL LOW (ref 1.005–1.030)
pH: 5 (ref 5.0–8.0)

## 2022-11-14 LAB — COMPREHENSIVE METABOLIC PANEL
ALT: 30 U/L (ref 0–44)
AST: 36 U/L (ref 15–41)
Albumin: 3.5 g/dL (ref 3.5–5.0)
Alkaline Phosphatase: 89 U/L (ref 38–126)
Anion gap: 9 (ref 5–15)
BUN: 6 mg/dL (ref 6–20)
CO2: 23 mmol/L (ref 22–32)
Calcium: 8.9 mg/dL (ref 8.9–10.3)
Chloride: 107 mmol/L (ref 98–111)
Creatinine, Ser: 0.77 mg/dL (ref 0.44–1.00)
GFR, Estimated: >60 mL/min (ref >60)
Glucose, Bld: 69 mg/dL — ABNORMAL LOW (ref 70–99)
Potassium: 3.8 mmol/L (ref 3.5–5.1)
Sodium: 139 mmol/L (ref 135–145)
Total Bilirubin: 0.7 mg/dL (ref 0.3–1.2)
Total Protein: 7.7 g/dL (ref 6.5–8.1)

## 2022-11-14 LAB — HEMOGLOBIN A1C
Hgb A1c MFr Bld: 7.8 % — ABNORMAL HIGH (ref 4.8–5.6)
Mean Plasma Glucose: 177.16 mg/dL

## 2022-11-14 LAB — GLUCOSE, CAPILLARY
Glucose-Capillary: 113 mg/dL — ABNORMAL HIGH (ref 70–99)
Glucose-Capillary: 248 mg/dL — ABNORMAL HIGH (ref 70–99)

## 2022-11-14 LAB — TROPONIN I (HIGH SENSITIVITY)
Troponin I (High Sensitivity): 6 ng/L (ref <18)
Troponin I (High Sensitivity): 6 ng/L (ref <18)

## 2022-11-14 LAB — HIV ANTIBODY (ROUTINE TESTING W REFLEX): HIV Screen 4th Generation wRfx: NONREACTIVE

## 2022-11-14 LAB — BRAIN NATRIURETIC PEPTIDE: B Natriuretic Peptide: 333 pg/mL — ABNORMAL HIGH (ref 0.0–100.0)

## 2022-11-14 LAB — MAGNESIUM: Magnesium: 2 mg/dL (ref 1.7–2.4)

## 2022-11-14 MED ORDER — AMLODIPINE BESYLATE 5 MG PO TABS
10.0000 mg | ORAL_TABLET | Freq: Every day | ORAL | Status: DC
  Administered 2022-11-14 – 2022-11-16 (×3): 10 mg via ORAL
  Filled 2022-11-14 (×3): qty 2

## 2022-11-14 MED ORDER — MOMETASONE FURO-FORMOTEROL FUM 100-5 MCG/ACT IN AERO
2.0000 | INHALATION_SPRAY | Freq: Two times a day (BID) | RESPIRATORY_TRACT | Status: DC
  Administered 2022-11-15 – 2022-11-16 (×3): 2 via RESPIRATORY_TRACT
  Filled 2022-11-14: qty 8.8

## 2022-11-14 MED ORDER — ACETAMINOPHEN 650 MG RE SUPP: 650.0000 mg | Freq: Four times a day (QID) | RECTAL | Status: DC | PRN

## 2022-11-14 MED ORDER — NICOTINE 21 MG/24HR TD PT24
21.0000 mg | MEDICATED_PATCH | Freq: Every day | TRANSDERMAL | Status: DC
  Administered 2022-11-14 – 2022-11-16 (×3): 21 mg via TRANSDERMAL
  Filled 2022-11-14 (×3): qty 1

## 2022-11-14 MED ORDER — INFLUENZA VAC SPLIT QUAD 0.5 ML IM SUSY
0.5000 mL | PREFILLED_SYRINGE | INTRAMUSCULAR | Status: AC
  Administered 2022-11-15: 0.5 mL via INTRAMUSCULAR
  Filled 2022-11-14: qty 0.5

## 2022-11-14 MED ORDER — ACETAMINOPHEN 325 MG PO TABS
650.0000 mg | ORAL_TABLET | Freq: Four times a day (QID) | ORAL | Status: DC | PRN
  Administered 2022-11-15 (×2): 650 mg via ORAL
  Filled 2022-11-14 (×2): qty 2

## 2022-11-14 MED ORDER — FUROSEMIDE 10 MG/ML IJ SOLN
20.0000 mg | Freq: Two times a day (BID) | INTRAMUSCULAR | Status: DC
  Administered 2022-11-14 – 2022-11-16 (×4): 20 mg via INTRAVENOUS
  Filled 2022-11-14 (×4): qty 2

## 2022-11-14 MED ORDER — ALBUTEROL SULFATE (2.5 MG/3ML) 0.083% IN NEBU: 2.5000 mg | INHALATION_SOLUTION | RESPIRATORY_TRACT | Status: DC | PRN

## 2022-11-14 MED ORDER — INSULIN ASPART 100 UNIT/ML IJ SOLN
0.0000 [IU] | Freq: Every day | INTRAMUSCULAR | Status: DC
  Administered 2022-11-14: 2 [IU] via SUBCUTANEOUS
  Administered 2022-11-15: 6 [IU] via SUBCUTANEOUS

## 2022-11-14 MED ORDER — ALPRAZOLAM 0.5 MG PO TABS: 0.5000 mg | ORAL_TABLET | Freq: Two times a day (BID) | ORAL | Status: DC | PRN

## 2022-11-14 MED ORDER — HEPARIN SODIUM (PORCINE) 5000 UNIT/ML IJ SOLN
5000.0000 [IU] | Freq: Three times a day (TID) | INTRAMUSCULAR | Status: DC
  Administered 2022-11-14 – 2022-11-16 (×5): 5000 [IU] via SUBCUTANEOUS
  Filled 2022-11-14 (×5): qty 1

## 2022-11-14 MED ORDER — ATORVASTATIN CALCIUM 20 MG PO TABS
20.0000 mg | ORAL_TABLET | Freq: Every day | ORAL | Status: DC
  Administered 2022-11-14 – 2022-11-15 (×2): 20 mg via ORAL
  Filled 2022-11-14 (×2): qty 1

## 2022-11-14 MED ORDER — FUROSEMIDE 10 MG/ML IJ SOLN
40.0000 mg | Freq: Once | INTRAMUSCULAR | Status: AC
  Administered 2022-11-14: 40 mg via INTRAVENOUS
  Filled 2022-11-14: qty 4

## 2022-11-14 MED ORDER — PANTOPRAZOLE SODIUM 40 MG PO TBEC
40.0000 mg | DELAYED_RELEASE_TABLET | Freq: Every day | ORAL | Status: DC
  Administered 2022-11-15 – 2022-11-16 (×2): 40 mg via ORAL
  Filled 2022-11-14 (×2): qty 1

## 2022-11-14 MED ORDER — INSULIN GLARGINE-YFGN 100 UNIT/ML ~~LOC~~ SOLN: 10.0000 [IU] | Freq: Every day | SUBCUTANEOUS | Status: DC

## 2022-11-14 MED ORDER — SODIUM CHLORIDE 0.9% FLUSH
3.0000 mL | Freq: Two times a day (BID) | INTRAVENOUS | Status: DC
  Administered 2022-11-16: 3 mL via INTRAVENOUS

## 2022-11-14 MED ORDER — TRAZODONE HCL 50 MG PO TABS: 50.0000 mg | ORAL_TABLET | Freq: Every evening | ORAL | Status: DC | PRN

## 2022-11-14 MED ORDER — LABETALOL HCL 5 MG/ML IV SOLN: 10.0000 mg | INTRAVENOUS | Status: DC | PRN

## 2022-11-14 MED ORDER — IPRATROPIUM-ALBUTEROL 0.5-2.5 (3) MG/3ML IN SOLN
3.0000 mL | Freq: Four times a day (QID) | RESPIRATORY_TRACT | Status: DC
  Administered 2022-11-14 – 2022-11-16 (×7): 3 mL via RESPIRATORY_TRACT
  Filled 2022-11-14 (×7): qty 3

## 2022-11-14 MED ORDER — METHYLPREDNISOLONE SODIUM SUCC 40 MG IJ SOLR
40.0000 mg | Freq: Two times a day (BID) | INTRAMUSCULAR | Status: DC
  Administered 2022-11-14 – 2022-11-16 (×4): 40 mg via INTRAVENOUS
  Filled 2022-11-14 (×4): qty 1

## 2022-11-14 MED ORDER — LISINOPRIL 10 MG PO TABS
40.0000 mg | ORAL_TABLET | Freq: Every day | ORAL | Status: DC
  Administered 2022-11-14 – 2022-11-16 (×3): 40 mg via ORAL
  Filled 2022-11-14 (×3): qty 4

## 2022-11-14 MED ORDER — INSULIN ASPART 100 UNIT/ML IJ SOLN
0.0000 [IU] | Freq: Three times a day (TID) | INTRAMUSCULAR | Status: DC
  Administered 2022-11-15: 5 [IU] via SUBCUTANEOUS
  Administered 2022-11-15: 3 [IU] via SUBCUTANEOUS
  Administered 2022-11-15: 6 [IU] via SUBCUTANEOUS

## 2022-11-14 MED ORDER — BISACODYL 10 MG RE SUPP: 10.0000 mg | Freq: Every day | RECTAL | Status: DC | PRN

## 2022-11-14 MED ORDER — ONDANSETRON HCL 4 MG PO TABS: 4.0000 mg | ORAL_TABLET | Freq: Four times a day (QID) | ORAL | Status: DC | PRN

## 2022-11-14 MED ORDER — ONDANSETRON HCL 4 MG/2ML IJ SOLN: 4.0000 mg | Freq: Four times a day (QID) | INTRAMUSCULAR | Status: DC | PRN

## 2022-11-14 MED ORDER — ASPIRIN 81 MG PO TBEC
81.0000 mg | DELAYED_RELEASE_TABLET | Freq: Every day | ORAL | Status: DC
  Administered 2022-11-15 – 2022-11-16 (×2): 81 mg via ORAL
  Filled 2022-11-14 (×2): qty 1

## 2022-11-14 MED ORDER — SODIUM CHLORIDE 0.9% FLUSH: 3.0000 mL | INTRAVENOUS | Status: DC | PRN

## 2022-11-14 MED ORDER — POTASSIUM CHLORIDE CRYS ER 20 MEQ PO TBCR
20.0000 meq | EXTENDED_RELEASE_TABLET | Freq: Once | ORAL | Status: AC
  Administered 2022-11-14: 20 meq via ORAL
  Filled 2022-11-14: qty 1

## 2022-11-14 MED ORDER — DM-GUAIFENESIN ER 30-600 MG PO TB12
1.0000 | ORAL_TABLET | Freq: Two times a day (BID) | ORAL | Status: DC
  Administered 2022-11-14 – 2022-11-16 (×4): 1 via ORAL
  Filled 2022-11-14 (×4): qty 1

## 2022-11-14 MED ORDER — SODIUM CHLORIDE 0.9% FLUSH
3.0000 mL | Freq: Two times a day (BID) | INTRAVENOUS | Status: DC
  Administered 2022-11-14 – 2022-11-16 (×4): 3 mL via INTRAVENOUS

## 2022-11-14 MED ORDER — SODIUM CHLORIDE 0.9 % IV SOLN: INTRAVENOUS | Status: DC | PRN

## 2022-11-14 MED ORDER — POLYETHYLENE GLYCOL 3350 17 G PO PACK: 17.0000 g | PACK | Freq: Every day | ORAL | Status: DC | PRN

## 2022-11-14 NOTE — ED Provider Notes (Signed)
[facility] Provider Note   CSN: [id number] Arrival date & time: 11/14/22  1038     History  Chief Complaint  Patient presents with   Shortness of Breath   HPI Misty Mccullough is a 52 y.o. female with hypertension, heavy cigarette smoker, diabetes, hypercholesteremia, and history of bilateral lower extremity edema presenting for shortness of breath.  Started last night while watching TV at home.  Patient states it is worse with exertion and with lying down.  Helps to sit up straight.  Also endorses chest pressure that radiates to the jaw.  Endorsing cough with clear sputum and congestion.  Denies fever and calf tenderness.  States she did use her inhaler which did help "a little bit" but symptoms returned.  Also she mentions she has lower extremity bilateral edema that she states is because of her diabetes.  She has noticed in the last 3 to 4 days that her swelling around her feet has worsened.  Has been on lasix for a year now for her edema.  Denies weight gain.  Denies bloody stools, blood in urine, vaginal bleeding, and hematemesis.   Shortness of Breath      Home Medications Prior to Admission medications   Medication Sig Start Date End Date Taking? Authorizing Provider  albuterol (VENTOLIN HFA) 108 (90 Base) MCG/ACT inhaler Inhale 2 puffs into the lungs every 6 (six) hours as needed for wheezing or shortness of breath. 12/13/21   Margaretha Seeds, MD  ALPRAZolam Duanne Moron) 0.5 MG tablet Take 1 tablet (0.5 mg total) by mouth 2 (two) times daily as needed for anxiety. 07/11/21   Bo Merino I, NP  amLODipine (NORVASC) 10 MG tablet Take 1 tablet by mouth once daily 07/04/22   Fenton Foy, NP  aspirin 325 MG EC tablet Take 1 tablet (325 mg total) by mouth daily. 01/01/21   Azzie Glatter, FNP  atorvastatin (LIPITOR) 10 MG tablet TAKE 1 TABLET (10 MG TOTAL) BY MOUTH DAILY. 01/28/22 01/28/23  Bo Merino I, NP  blood glucose meter kit and  supplies KIT Dispense based on patient and insurance preference. Use up to four times daily as directed. 03/08/22   Passmore, Jake Church I, NP  fluticasone-salmeterol (ADVAIR) 100-50 MCG/ACT AEPB Inhale 1 puff into the lungs 2 (two) times daily. 06/29/21   Passmore, Jake Church I, NP  furosemide (LASIX) 20 MG tablet TAKE 1 TABLET (20 MG TOTAL) BY MOUTH 2 (TWO) TIMES DAILY AS NEEDED. 01/29/22 01/29/23  Passmore, Jake Church I, NP  glipiZIDE (GLUCOTROL) 10 MG tablet TAKE 1 TABLET (10 MG TOTAL) BY MOUTH 2 (TWO) TIMES DAILY BEFORE A MEAL. 02/27/22 02/27/23  Passmore, Jake Church I, NP  hydrochlorothiazide (HYDRODIURIL) 25 MG tablet Take 1 tablet (25 mg total) by mouth daily. 11/30/21 02/28/22  Passmore, Jake Church I, NP  insulin glargine (LANTUS) 100 UNIT/ML Solostar Pen Inject 34 Units into the skin at bedtime. 02/27/22 05/28/22  Bo Merino I, NP  Insulin Pen Needle (BD ULTRA-FINE PEN NEEDLES) 29G X 12.7MM MISC ICD10 E11.9 for use with insulin administration Patient not taking: Reported on 11/30/2021 01/01/21   Azzie Glatter, FNP  Insulin Syringes, Disposable, U-100 0.3 ML MISC 1 each by Does not apply route at bedtime. Patient not taking: Reported on 12/13/2021 01/30/18   Dorena Dew, FNP  Lancets [employer] [facility] ULTRASOFT) lancets Use as instructed 03/08/22   Bo Merino I, NP  lisinopril (ZESTRIL) 40 MG tablet Take 1 tablet (40 mg total) by mouth daily. 08/19/22  Fenton Foy, NP  metFORMIN (GLUCOPHAGE) 1000 MG tablet TAKE 1 TABLET (1,000 MG TOTAL) BY MOUTH 2 (TWO) TIMES DAILY WITH A MEAL. 02/27/22 02/27/23  Passmore, Jake Church I, NP  nicotine (NICODERM CQ - DOSED IN MG/24 HOURS) 14 mg/24hr patch Place 1 patch (14 mg total) onto the skin daily. Patient not taking: Reported on 11/30/2021 06/29/21   Bo Merino I, NP  omeprazole (PRILOSEC) 40 MG capsule TAKE 1 CAPSULE (40 MG TOTAL) BY MOUTH DAILY. 01/28/22 01/28/23  Bo Merino I, NP  thiamine 100 MG tablet Take 1 tablet (100 mg total) by mouth daily. Patient not taking:  Reported on 12/13/2021 01/01/21   Azzie Glatter, FNP  Tiotropium Bromide Monohydrate (SPIRIVA RESPIMAT) 2.5 MCG/ACT AERS Inhale 2 puffs into the lungs daily. 12/13/21   Margaretha Seeds, MD  traZODone (DESYREL) 100 MG tablet TAKE 1 TABLET (100 MG TOTAL) BY MOUTH AT BEDTIME AS NEEDED FOR SLEEP. Patient not taking: Reported on 11/30/2021 01/01/21 01/01/22  Azzie Glatter, FNP      Allergies    Patient has no known allergies.    Review of Systems   Review of Systems  Respiratory:  Positive for shortness of breath.     Physical Exam   Vitals:   11/14/22 1303 11/14/22 1529  BP: (!) 172/68 (!) 170/75  Pulse: 88 94  Resp: (!) 24   Temp:  97.8 F (36.6 C)  SpO2: 95% 97%    CONSTITUTIONAL:  well-appearing, NAD NEURO:  Alert and oriented x 3, CN 3-12 grossly intact EYES:  eyes equal and reactive ENT/NECK:  Supple, no stridor  CARDIO: regular rate and rhythm, appears well-perfused  PULM:  No respiratory distress, CTAB GI/GU:  non-distended, soft MSK/SPINE:  No gross deformities, moves all extremities, 1+ pitting edema bilaterally in the feet extending up around the ankle joint SKIN:  no rash, atraumatic  *Additional and/or pertinent findings included in MDM below    ED Results / Procedures / Treatments   Labs (all labs ordered are listed, but only abnormal results are displayed) Labs Reviewed  BRAIN NATRIURETIC PEPTIDE - Abnormal; Notable for the following components:      Result Value   B Natriuretic Peptide 333.0 (*)    All other components within normal limits  COMPREHENSIVE METABOLIC PANEL - Abnormal; Notable for the following components:   Glucose, Bld 69 (*)    All other components within normal limits  CBC WITH DIFFERENTIAL/PLATELET - Abnormal; Notable for the following components:   WBC 3.4 (*)    RBC 3.28 (*)    Hemoglobin 8.4 (*)    HCT 27.6 (*)    MCH 25.6 (*)    RDW 17.7 (*)    All other components within normal limits  URINALYSIS, ROUTINE W REFLEX  MICROSCOPIC - Abnormal; Notable for the following components:   Color, Urine COLORLESS (*)    Specific Gravity, Urine 1.003 (*)    Hgb urine dipstick SMALL (*)    Protein, ur 100 (*)    All other components within normal limits  RESP PANEL BY RT-PCR (RSV, FLU A&B, COVID)  RVPGX2  MAGNESIUM  HEMOGLOBIN A1C  CBG MONITORING, ED  TROPONIN I (HIGH SENSITIVITY)  TROPONIN I (HIGH SENSITIVITY)    EKG EKG Interpretation  Date/Time:  Thursday November 14 2022 10:56:41 EST Ventricular Rate:  82 PR Interval:  150 QRS Duration: 78 QT Interval:  402 QTC Calculation: 469 R Axis:   83 Text Interpretation: Normal sinus rhythm ST & T wave abnormality,  consider lateral ischemia Prolonged QT  similar to Mar 2019 Confirmed by Sherwood Gambler [phone]) on 11/14/2022 11:08:37 AM  Radiology DG Chest 2 View  Result Date: 11/14/2022 CLINICAL DATA:  Shortness of breath, cough, and chest pain EXAM: CHEST - 2 VIEW COMPARISON:  Chest radiograph dated 12/13/2021 FINDINGS: Normal lung volumes. Bilateral mid lung linear opacities. Bilateral interstitial opacities. Left basilar patchy opacities. Middle lobe and lingular atelectasis. No pleural effusion or pneumothorax. The heart size and mediastinal contours are within normal limits. The visualized skeletal structures are unremarkable. IMPRESSION: Bilateral interstitial opacities and left basilar patchy opacities, likely a combination of pulmonary edema and atelectasis. Electronically Signed   By: Darrin Nipper M.D.   On: 11/14/2022 11:58    Procedures Procedures    Medications Ordered in ED Medications  insulin aspart (novoLOG) injection 0-6 Units (has no administration in time range)  insulin aspart (novoLOG) injection 0-5 Units (has no administration in time range)  furosemide (LASIX) injection 40 mg (40 mg Intravenous Given 11/14/22 1330)  potassium chloride SA (KLOR-CON M) CR tablet 20 mEq (20 mEq Oral Given 11/14/22 1330)    ED Course/ Medical Decision Making/  A&P                             Medical Decision Making Amount and/or Complexity of Data Reviewed Labs: ordered. Radiology: ordered.  Risk Prescription drug management. Decision regarding hospitalization.   Initial Impression and Ddx 52 year old female who is well-appearing and HD stable presenting for shortness of breath.  Exam notable for tachypnea and lower extremity edema.  DDx includes CHF exacerbation, ACS, COPD exacerbation, pneumonia and URI. Patient PMH that increases complexity of ED encounter: h/o CVA, diabetes, hypertension, HLD, and chronic bronchitis  Interpretation of Diagnostics I independent reviewed and interpreted the labs as followed: Elevated BNP, anemia, hematuria  - I independently visualized the following imaging with scope of interpretation limited to determining acute life threatening conditions related to emergency care: Chest x-ray, which revealed pulmonary edema and atelectasis  -I personally reviewed and interpreted EKG which revealed normal sinus rhythm.  Patient Reassessment and Ultimate Disposition/Management Given pulmonary edema on x-ray, lower extremity edema, tachypnea and an elevated BNP in setting of shortness of breath symptoms most consistent with acute heart failure exacerbation.  Treated with IV Lasix and oral potassium supplement.  Reevaluated patient and she continues to endorse shortness of breath.  Placed on O2 for comfort.  Labs also revealed anemia.  Patient denied any bleeding.  Could be dilutional anemia secondary to her fluid overload.  Spoke to Dr. Denton Brick who admitted her for CHF exacerbation.  Patient management required discussion with the following services or consulting groups:  Hospitalist Service  Complexity of Problems Addressed Acute complicated illness or Injury  Additional Data Reviewed and Analyzed Further history obtained from: Past medical history and medications listed in the EMR and Prior ED visit notes  Patient  Encounter Risk Assessment Major procedures         Final Clinical Impression(s) / ED Diagnoses Final diagnoses:  Acute on chronic congestive heart failure, unspecified heart failure type [facility])    Rx / DC Orders ED Discharge Orders     None         Harriet Pho, PA-C 11/14/22 1600    Sherwood Gambler, MD 11/15/22 [phone]

## 2022-11-14 NOTE — ED Triage Notes (Signed)
Pt c/o sob that started last night; pt having swelling to bilateral feet and chest pain  Pt coughing up clear sputum

## 2022-11-14 NOTE — H&P (Addendum)
Patient Demographics:    Misty Mccullough, is a 52 y.o. female  MRN: [id number]   DOB - August 27, 1971  Admit Date - 11/14/2022  Outpatient Primary MD for the patient is Bo Merino I, NP   Assessment & Plan:   Assessment and Plan: 1) acute diastolic CHF exacerbation---POA.... Patient was having chest discomfort and dyspnea at rest --COVID, flu and RSV negative -Chest x-ray with CHF findings/pulmonary edema -BNP elevated at 333 -In the troponin is 6, repeat troponin 6 --EKG sinus rhythm with nonspecific ST and T wave changes, QT is prolonged -Echo from 12/16/2017 with EF of 60 to 65%,, grade 1 diastolic dysfunction -Repeat echo requested -IV Lasix ordered -Weights and fluid input and output monitoring -Consider cardiology consult pending echo findings  2)H/o Prior CVA- pt had right thalamic lacunar infarct in March 2019 continue aspirin and Lipitor for secondary prevention  3)HTN--stage II--- restart amlodipine and lisinopril -IV labetalol as needed  4) tobacco abuse and COPD--- patient smokes more than 1 pack a day -Solu-Medrol, mucolytics and bronchodilators as ordered -Nicotine patch  5)DM2--history of uncontrolled DM with persistent hyperglycemia -A1c requested -Anticipate worsening hyperglycemia with steroids -give Glargine insulin Use Novolog/Humalog Sliding scale insulin with Accu-Cheks/Fingersticks as ordered   6)GERD--Protonix especially while on steroids  7) chronic anemia--- Hgb was 10.8 in February 2023 and 11.0 in March 2022 -No acute bleeding concerns at this time [facility] is borderline low MCV is WNL   Status is: Inpatient  Remains inpatient appropriate because:   Dispo: The patient is from: Home              Anticipated d/c is to: Home              Anticipated d/c date is: 2 days               Patient currently is not medically stable to d/c. Barriers: Not Clinically Stable-   With History of - Reviewed by me  Past Medical History:  Diagnosis Date   Bilateral lower extremity edema    Cough    Diabetes mellitus without complication ([place])    Gallstones    Heavy cigarette smoker    Hemoglobin A1C between 7% and 9% indicating borderline diabetic control ([place]) 08/2019   Hypercholesteremia    Hyperglycemia    Hypertension    Shortness of breath    Vitamin D deficiency 12/2020      Past Surgical History:  Procedure Laterality Date   TUBAL LIGATION      Chief Complaint  Patient presents with   Shortness of Breath      HPI:    Misty Mccullough  is a 52 y.o. female active smoker with past medical history relevant for history of prior CVA, DM2,, HTN and HLD, and GERD presents to the ED with concerns about chest discomfort and dyspnea that started on 11/13/2022... Symptoms started in the evening of 11/13/2022 while watching TV laying down... -- Dyspnea  and chest discomfort is not exertional, -Chest discomfort radiates to jaw - Patient is a smoker with COPD endorses cough with mostly clear sputum -No fevers no chills -She does have some lower extremity edema but no calf pain no pleuritic type symptoms  No Nausea, Vomiting or Diarrhea Chest x-ray with CHF findings/pulmonary edema -BNP elevated at 333 -In the troponin is 6, repeat troponin 6 -EKG sinus rhythm with nonspecific ST and T wave changes, QT is prolonged -Magnesium is 2.0, potassium is 3.8, creatinine 0.77 LFTs are not elevated -COVID, flu and RSV negative -UA is not suggestive of UTI -WBC is 3.4 Hgb is 8.4 platelets clumped   Review of systems:    In addition to the HPI above,   A full Review of  Systems was done, all other systems reviewed are negative except as noted above in HPI , .    Social History:  Reviewed by me    Social History   Tobacco Use   Smoking status: Every Day    Packs/day:  1.00    Years: 32.00    Total pack years: 32.00    Types: Cigarettes   Smokeless tobacco: Never   Tobacco comments:    Since age 37.  Substance Use Topics   Alcohol use: Not Currently    Comment: Occasional    Family History :  Reviewed by me    Family History  Problem Relation Age of Onset   Diabetes Mother    Uterine cancer Mother    Diabetes Father    Heart attack Father 63   Stroke Father    Heart attack Paternal Grandmother    Heart attack Paternal Grandfather    Stroke Sister    Heart attack Paternal Uncle    Bone cancer Paternal Uncle     Home Medications:   Prior to Admission medications   Medication Sig Start Date End Date Taking? Authorizing Provider  albuterol (VENTOLIN HFA) 108 (90 Base) MCG/ACT inhaler Inhale 2 puffs into the lungs every 6 (six) hours as needed for wheezing or shortness of breath. 12/13/21   Margaretha Seeds, MD  ALPRAZolam Duanne Moron) 0.5 MG tablet Take 1 tablet (0.5 mg total) by mouth 2 (two) times daily as needed for anxiety. 07/11/21   Bo Merino I, NP  amLODipine (NORVASC) 10 MG tablet Take 1 tablet by mouth once daily 07/04/22   Fenton Foy, NP  aspirin 325 MG EC tablet Take 1 tablet (325 mg total) by mouth daily. 01/01/21   Azzie Glatter, FNP  atorvastatin (LIPITOR) 10 MG tablet TAKE 1 TABLET (10 MG TOTAL) BY MOUTH DAILY. 01/28/22 01/28/23  Bo Merino I, NP  blood glucose meter kit and supplies KIT Dispense based on patient and insurance preference. Use up to four times daily as directed. 03/08/22   Passmore, Jake Church I, NP  fluticasone-salmeterol (ADVAIR) 100-50 MCG/ACT AEPB Inhale 1 puff into the lungs 2 (two) times daily. 06/29/21   Passmore, Jake Church I, NP  furosemide (LASIX) 20 MG tablet TAKE 1 TABLET (20 MG TOTAL) BY MOUTH 2 (TWO) TIMES DAILY AS NEEDED. 01/29/22 01/29/23  Passmore, Jake Church I, NP  glipiZIDE (GLUCOTROL) 10 MG tablet TAKE 1 TABLET (10 MG TOTAL) BY MOUTH 2 (TWO) TIMES DAILY BEFORE A MEAL. 02/27/22 02/27/23  Passmore,  Jake Church I, NP  hydrochlorothiazide (HYDRODIURIL) 25 MG tablet Take 1 tablet (25 mg total) by mouth daily. 11/30/21 02/28/22  Passmore, Jake Church I, NP  insulin glargine (LANTUS) 100 UNIT/ML Solostar Pen Inject 34 Units into the  skin at bedtime. 02/27/22 05/28/22  Orion Crook I, NP  Insulin Pen Needle (BD ULTRA-FINE PEN NEEDLES) 29G X 12.7MM MISC ICD10 E11.9 for use with insulin administration Patient not taking: Reported on 11/30/2021 01/01/21   Kallie Locks, FNP  Insulin Syringes, Disposable, U-100 0.3 ML MISC 1 each by Does not apply route at bedtime. Patient not taking: Reported on 12/13/2021 01/30/18   Massie Maroon, FNP  Lancets [facility] ULTRASOFT) lancets Use as instructed 03/08/22   Orion Crook I, NP  lisinopril (ZESTRIL) 40 MG tablet Take 1 tablet (40 mg total) by mouth daily. 08/19/22   Ivonne Andrew, NP  metFORMIN (GLUCOPHAGE) 1000 MG tablet TAKE 1 TABLET (1,000 MG TOTAL) BY MOUTH 2 (TWO) TIMES DAILY WITH A MEAL. 02/27/22 02/27/23  Passmore, Enid Derry I, NP  nicotine (NICODERM CQ - DOSED IN MG/24 HOURS) 14 mg/24hr patch Place 1 patch (14 mg total) onto the skin daily. Patient not taking: Reported on 11/30/2021 06/29/21   Orion Crook I, NP  omeprazole (PRILOSEC) 40 MG capsule TAKE 1 CAPSULE (40 MG TOTAL) BY MOUTH DAILY. 01/28/22 01/28/23  Orion Crook I, NP  thiamine 100 MG tablet Take 1 tablet (100 mg total) by mouth daily. Patient not taking: Reported on 12/13/2021 01/01/21   Kallie Locks, FNP  Tiotropium Bromide Monohydrate (SPIRIVA RESPIMAT) 2.5 MCG/ACT AERS Inhale 2 puffs into the lungs daily. 12/13/21   Luciano Cutter, MD  traZODone (DESYREL) 100 MG tablet TAKE 1 TABLET (100 MG TOTAL) BY MOUTH AT BEDTIME AS NEEDED FOR SLEEP. Patient not taking: Reported on 11/30/2021 01/01/21 01/01/22  Kallie Locks, FNP     Allergies:    No Known Allergies   Physical Exam:   Vitals  Blood pressure (!) 170/75, pulse 94, temperature 97.8 F (36.6 C), resp. rate (!) 24, height 5'  4" (1.626 m), weight 65.8 kg, SpO2 97 %.  Physical Examination: General appearance - alert,  in no distress Mental status - alert, oriented to person, place, and time,  Eyes - sclera anicteric Neck - supple, no JVD elevation , Chest -diminished breath sounds, faint bibasilar Rales Heart - S1 and S2 normal, regular  Abdomen - soft, nontender, nondistended, +BS Neurological - screening mental status exam normal, neck supple without rigidity, cranial nerves II through XII intact, DTR's normal and symmetric Extremities - +ve  pedal edema noted, intact peripheral pulses  Skin - warm, dry     Data Review:    CBC Recent Labs  Lab 11/14/22 1115  WBC 3.4*  HGB 8.4*  HCT 27.6*  PLT ACLMP  MCV 84.1  MCH 25.6*  MCHC 30.4  RDW 17.7*  LYMPHSABS 1.0  MONOABS 0.2  EOSABS 0.1  BASOSABS 0.0   ------------------------------------------------------------------------------------------------------------------  Chemistries  Recent Labs  Lab 11/14/22 1115 11/14/22 1258  NA 139  --   K 3.8  --   CL 107  --   CO2 23  --   GLUCOSE 69*  --   BUN 6  --   CREATININE 0.77  --   CALCIUM 8.9  --   MG  --  2.0  AST 36  --   ALT 30  --   ALKPHOS 89  --   BILITOT 0.7  --    ------------------------------------------------------------------------------------------------------------------ estimated creatinine clearance is 76.7 mL/min (by C-G formula based on SCr of 0.77 mg/dL). ------------------------------------------------------------------------------------------------------------------ ------------------------------------------------------------------------------------------------------------------    Component Value Date/Time   BNP 333.0 (H) 11/14/2022 1115   Urinalysis    Component Value Date/Time  COLORURINE COLORLESS (A) 11/14/2022 1230   APPEARANCEUR CLEAR 11/14/2022 1230   LABSPEC 1.003 (L) 11/14/2022 1230   PHURINE 5.0 11/14/2022 1230   GLUCOSEU NEGATIVE 11/14/2022 1230    HGBUR SMALL (A) 11/14/2022 1230   BILIRUBINUR NEGATIVE 11/14/2022 1230   BILIRUBINUR negative 02/27/2022 1001   BILIRUBINUR neg 01/01/2021 1508   KETONESUR NEGATIVE 11/14/2022 1230   PROTEINUR 100 (A) 11/14/2022 1230   UROBILINOGEN 0.2 02/27/2022 1001   UROBILINOGEN 1.0 12/29/2017 1154   NITRITE NEGATIVE 11/14/2022 1230   LEUKOCYTESUR NEGATIVE 11/14/2022 1230    ----------------------------------------------------------------------------------------------------------------   Imaging Results:    DG Chest 2 View  Result Date: 11/14/2022 CLINICAL DATA:  Shortness of breath, cough, and chest pain EXAM: CHEST - 2 VIEW COMPARISON:  Chest radiograph dated 12/13/2021 FINDINGS: Normal lung volumes. Bilateral mid lung linear opacities. Bilateral interstitial opacities. Left basilar patchy opacities. Middle lobe and lingular atelectasis. No pleural effusion or pneumothorax. The heart size and mediastinal contours are within normal limits. The visualized skeletal structures are unremarkable. IMPRESSION: Bilateral interstitial opacities and left basilar patchy opacities, likely a combination of pulmonary edema and atelectasis. Electronically Signed   By: Darrin Nipper M.D.   On: 11/14/2022 11:58    Radiological Exams on Admission: DG Chest 2 View  Result Date: 11/14/2022 CLINICAL DATA:  Shortness of breath, cough, and chest pain EXAM: CHEST - 2 VIEW COMPARISON:  Chest radiograph dated 12/13/2021 FINDINGS: Normal lung volumes. Bilateral mid lung linear opacities. Bilateral interstitial opacities. Left basilar patchy opacities. Middle lobe and lingular atelectasis. No pleural effusion or pneumothorax. The heart size and mediastinal contours are within normal limits. The visualized skeletal structures are unremarkable. IMPRESSION: Bilateral interstitial opacities and left basilar patchy opacities, likely a combination of pulmonary edema and atelectasis. Electronically Signed   By: Darrin Nipper M.D.   On: 11/14/2022  11:58    DVT Prophylaxis -SCD /Heparin AM Labs Ordered, also please review Full Orders  Family Communication: Admission, patients condition and plan of care including tests being ordered have been discussed with the patient who indicate understanding and agree with the plan   Condition  -stable  Roxan Hockey M.D on 11/14/2022 at 4:32 PM Go to [email or web address] -  for contact info  [facility] - Office  [phone]

## 2022-11-14 NOTE — ED Notes (Signed)
Pt reports shortness of breath beginning last night. Worse with lying down and exertion. Reports history of breathing problems. Peripheral edema. C/P reported that radiates to jaw. Described as pressure sensation. Productive cough with clear production.

## 2022-11-15 ENCOUNTER — Inpatient Hospital Stay (HOSPITAL_COMMUNITY): Payer: Medicaid Other

## 2022-11-15 DIAGNOSIS — R0609 Other forms of dyspnea: Secondary | ICD-10-CM

## 2022-11-15 DIAGNOSIS — F17200 Nicotine dependence, unspecified, uncomplicated: Secondary | ICD-10-CM | POA: Diagnosis not present

## 2022-11-15 DIAGNOSIS — I10 Essential (primary) hypertension: Secondary | ICD-10-CM | POA: Diagnosis not present

## 2022-11-15 DIAGNOSIS — E118 Type 2 diabetes mellitus with unspecified complications: Secondary | ICD-10-CM | POA: Diagnosis not present

## 2022-11-15 DIAGNOSIS — I5033 Acute on chronic diastolic (congestive) heart failure: Secondary | ICD-10-CM | POA: Diagnosis not present

## 2022-11-15 LAB — CBC
HCT: 26.3 % — ABNORMAL LOW (ref 36.0–46.0)
Hemoglobin: 8.1 g/dL — ABNORMAL LOW (ref 12.0–15.0)
MCH: 25.9 pg — ABNORMAL LOW (ref 26.0–34.0)
MCHC: 30.8 g/dL (ref 30.0–36.0)
MCV: 84 fL (ref 80.0–100.0)
Platelets: 200 10*3/uL (ref 150–400)
RBC: 3.13 MIL/uL — ABNORMAL LOW (ref 3.87–5.11)
RDW: 17.5 % — ABNORMAL HIGH (ref 11.5–15.5)
WBC: 3.9 10*3/uL — ABNORMAL LOW (ref 4.0–10.5)
nRBC: 0 % (ref 0.0–0.2)

## 2022-11-15 LAB — BASIC METABOLIC PANEL
Anion gap: 8 (ref 5–15)
BUN: 12 mg/dL (ref 6–20)
CO2: 24 mmol/L (ref 22–32)
Calcium: 8.5 mg/dL — ABNORMAL LOW (ref 8.9–10.3)
Chloride: 103 mmol/L (ref 98–111)
Creatinine, Ser: 0.83 mg/dL (ref 0.44–1.00)
GFR, Estimated: >60 mL/min (ref >60)
Glucose, Bld: 333 mg/dL — ABNORMAL HIGH (ref 70–99)
Potassium: 3.9 mmol/L (ref 3.5–5.1)
Sodium: 135 mmol/L (ref 135–145)

## 2022-11-15 LAB — ECHOCARDIOGRAM COMPLETE
AR max vel: 1.85 cm2
AV Area VTI: 1.84 cm2
AV Area mean vel: 1.84 cm2
AV Mean grad: 5.9 mmHg
AV Peak grad: 10.8 mmHg
Ao pk vel: 1.64 m/s
Area-P 1/2: 3.19 cm2
Height: 64 in
MV VTI: 1.75 cm2
S' Lateral: 3.3 cm
Weight: 2472.68 oz

## 2022-11-15 LAB — GLUCOSE, CAPILLARY
Glucose-Capillary: 273 mg/dL — ABNORMAL HIGH (ref 70–99)
Glucose-Capillary: 386 mg/dL — ABNORMAL HIGH (ref 70–99)
Glucose-Capillary: 402 mg/dL — ABNORMAL HIGH (ref 70–99)
Glucose-Capillary: 413 mg/dL — ABNORMAL HIGH (ref 70–99)
Glucose-Capillary: 423 mg/dL — ABNORMAL HIGH (ref 70–99)
Glucose-Capillary: 445 mg/dL — ABNORMAL HIGH (ref 70–99)
Glucose-Capillary: 467 mg/dL — ABNORMAL HIGH (ref 70–99)

## 2022-11-15 LAB — GLUCOSE, RANDOM: Glucose, Bld: 415 mg/dL — ABNORMAL HIGH (ref 70–99)

## 2022-11-15 MED ORDER — INSULIN GLARGINE-YFGN 100 UNIT/ML ~~LOC~~ SOLN
10.0000 [IU] | Freq: Every day | SUBCUTANEOUS | Status: DC
  Administered 2022-11-15: 10 [IU] via SUBCUTANEOUS
  Filled 2022-11-15 (×2): qty 0.1

## 2022-11-15 MED ORDER — INSULIN ASPART 100 UNIT/ML IJ SOLN
9.0000 [IU] | Freq: Once | INTRAMUSCULAR | Status: AC
  Administered 2022-11-16: 9 [IU] via SUBCUTANEOUS

## 2022-11-15 MED ORDER — INSULIN ASPART 100 UNIT/ML IJ SOLN
16.0000 [IU] | Freq: Once | INTRAMUSCULAR | Status: AC
  Administered 2022-11-15: 16 [IU] via SUBCUTANEOUS

## 2022-11-15 NOTE — Progress Notes (Addendum)
PROGRESS NOTE     Misty Mccullough, is a 52 y.o. female, DOB - August 27, 1971, [id number]  Admit date - 11/14/2022   Admitting Physician Shabreka Coulon Denton Brick, MD  Outpatient Primary MD for the patient is Bo Merino I, NP  LOS - 1  Chief Complaint  Patient presents with   Shortness of Breath        Brief Narrative:   52 y.o. female active smoker with past medical history relevant for history of prior CVA, DM2,, HTN and HLD, and GERD presents to the ED with concerns about chest discomfort and dyspnea was admitted on 11/14/2022 with acute dCHF exacerbation    -Assessment and Plan: 1) acute diastolic CHF exacerbation---POA.... --COVID, flu and RSV negative -Chest x-ray with CHF findings/pulmonary edema -BNP elevated at 333 -In the troponin is 6, repeat troponin 6 --EKG sinus rhythm with nonspecific ST and T wave changes, QT is prolonged -Echo from 12/16/2017 with EF of 60 to 65%,, grade 1 diastolic dysfunction [id number] --No further chest pains -Dyspnea improving -Echo from 11/15/2022 with EF is 55 to [id number], grade 1 diastolic dysfunction noted -No wall motion abnormalities -IV Lasix ordered -Weights and fluid input and output monitoring    2)H/o Prior CVA- pt had right thalamic lacunar infarct in March 2019 continue aspirin and Lipitor for secondary prevention   3)HTN--stage II--- restart amlodipine and lisinopril -IV labetalol as needed   4) tobacco abuse and COPD--- patient smokes more than 1 pack a day -Solu-Medrol, mucolytics and bronchodilators as ordered -Nicotine patch   5)DM2--history of uncontrolled DM with persistent hyperglycemia -A1c requested -Anticipate worsening hyperglycemia with steroids -give Glargine insulin Use Novolog/Humalog Sliding scale insulin with Accu-Cheks/Fingersticks as ordered    6)GERD--Protonix especially while on steroids   7) chronic anemia--- Hgb was 10.8 in February 2023 and 11.0 in March 2022 -No acute bleeding concerns at this time [facility]  is borderline low MCV is WNL -Hgb currently greater than 8  8) acute hypoxic respiratory failure--- secondary to 1 above -Currently on 2 L of oxygen via nasal cannula    Status is: Inpatient   Disposition: The patient is from: Home              Anticipated d/c is to: Home              Anticipated d/c date is: 2 days              Patient currently is not medically stable to d/c. Barriers: Not Clinically Stable-   Code Status :  -  Code Status: Full Code   Family Communication:    NA (patient is alert, awake and coherent)   DVT Prophylaxis  :   - SCDs   heparin injection 5,000 Units Start: 11/14/22 2200 SCDs Start: 11/14/22 1626 Place TED hose Start: 11/14/22 1626   Lab Results  Component Value Date   PLT 200 11/15/2022    Inpatient Medications  Scheduled Meds:  amLODipine  10 mg Oral Daily   aspirin EC  81 mg Oral Q breakfast   atorvastatin  20 mg Oral q1800   dextromethorphan-guaiFENesin  1 tablet Oral BID   furosemide  20 mg Intravenous Q12H   heparin  5,000 Units Subcutaneous Q8H   insulin aspart  0-5 Units Subcutaneous QHS   insulin aspart  0-6 Units Subcutaneous TID WC   insulin glargine-yfgn  10 Units Subcutaneous Daily   ipratropium-albuterol  3 mL Nebulization Q6H   lisinopril  40 mg Oral Daily   methylPREDNISolone (SOLU-MEDROL)  injection  40 mg Intravenous Q12H   mometasone-formoterol  2 puff Inhalation BID   nicotine  21 mg Transdermal Daily   pantoprazole  40 mg Oral Daily   sodium chloride flush  3 mL Intravenous Q12H   sodium chloride flush  3 mL Intravenous Q12H   Continuous Infusions:  sodium chloride     PRN Meds:.sodium chloride, acetaminophen **OR** acetaminophen, albuterol, bisacodyl, labetalol, ondansetron **OR** ondansetron (ZOFRAN) IV, polyethylene glycol, sodium chloride flush, traZODone   Anti-infectives (From admission, onward)    None         Subjective: Andreas Evangelista today has no fevers, no emesis,  No chest pain,   - Dyspnea  improving slowly, hypoxia persist requiring 2 L of oxygen via nasal cannula -Patient is voiding okay   Objective: Vitals:   11/15/22 0907 11/15/22 0908 11/15/22 1339 11/15/22 1430  BP:   (!) 140/56   Pulse:   82   Resp:   17   Temp:   98.4 F (36.9 C)   TempSrc:   Oral   SpO2: 95% 96% 96% 95%  Weight:      Height:        Intake/Output Summary (Last 24 hours) at 11/15/2022 1847 Last data filed at 11/15/2022 1300 Gross per 24 hour  Intake 720 ml  Output --  Net 720 ml   Filed Weights   11/14/22 1058 11/15/22 0500  Weight: 65.8 kg 70.1 kg   Physical Exam  Gen:- Awake Alert,  in no apparent distress  HEENT:- [place].AT, No sclera icterus Nose- [place] 2L/min Neck-Supple Neck,No JVD,.  Lungs-diminished breath sounds, faint bibasilar Rales  CV- S1, S2 normal, regular  Abd-  +ve B.Sounds, Abd Soft, No tenderness,    Extremity/Skin:- +ve edema, pedal pulses present  Psych-affect is appropriate, oriented x3 Neuro-no new focal deficits, no tremors  Data Reviewed: I have personally reviewed following labs and imaging studies  CBC: Recent Labs  Lab 11/14/22 1115 11/15/22 0435  WBC 3.4* 3.9*  NEUTROABS 2.1  --   HGB 8.4* 8.1*  HCT 27.6* 26.3*  MCV 84.1 84.0  PLT ACLMP [id number]   Basic Metabolic Panel: Recent Labs  Lab 11/14/22 1115 11/14/22 1258 11/15/22 0435  NA 139  --  135  K 3.8  --  3.9  CL 107  --  103  CO2 23  --  24  GLUCOSE 69*  --  333*  BUN 6  --  12  CREATININE 0.77  --  0.83  CALCIUM 8.9  --  8.5*  MG  --  2.0  --    GFR: Estimated Creatinine Clearance: 76.2 mL/min (by C-G formula based on SCr of 0.83 mg/dL). Liver Function Tests: Recent Labs  Lab 11/14/22 1115  AST 36  ALT 30  ALKPHOS 89  BILITOT 0.7  PROT 7.7  ALBUMIN 3.5   HbA1C: Recent Labs    11/14/22 1450  HGBA1C 7.8*   Recent Results (from the past 240 hour(s))  Resp panel by RT-PCR (RSV, Flu A&B, Covid) Anterior Nasal Swab     Status: None   Collection Time: 11/14/22  1:25 PM    Specimen: Anterior Nasal Swab  Result Value Ref Range Status   SARS Coronavirus 2 by RT PCR NEGATIVE NEGATIVE Final    Comment: (NOTE) SARS-CoV-2 target nucleic acids are NOT DETECTED.  The SARS-CoV-2 RNA is generally detectable in upper respiratory specimens during the acute phase of infection. The lowest concentration of SARS-CoV-2 viral copies this assay can detect is  138 copies/mL. A negative result does not preclude SARS-Cov-2 infection and should not be used as the sole basis for treatment or other patient management decisions. A negative result may occur with  improper specimen collection/handling, submission of specimen other than nasopharyngeal swab, presence of viral mutation(s) within the areas targeted by this assay, and inadequate number of viral copies(<138 copies/mL). A negative result must be combined with clinical observations, patient history, and epidemiological information. The expected result is Negative.  Fact Sheet for Patients:  EntrepreneurPulse.com.au  Fact Sheet for Healthcare Providers:  IncredibleEmployment.be  This test is no t yet approved or cleared by the [place] FDA and  has been authorized for detection and/or diagnosis of SARS-CoV-2 by FDA under an Emergency Use Authorization (EUA). This EUA will remain  in effect (meaning this test can be used) for the duration of the COVID-19 declaration under Section 564(b)(1) of the Act, 21 U.S.C.section 360bbb-3(b)(1), unless the authorization is terminated  or revoked sooner.       Influenza A by PCR NEGATIVE NEGATIVE Final   Influenza B by PCR NEGATIVE NEGATIVE Final    Comment: (NOTE) The Xpert Xpress SARS-CoV-2/FLU/RSV plus assay is intended as an aid in the diagnosis of influenza from Nasopharyngeal swab specimens and should not be used as a sole basis for treatment. Nasal washings and aspirates are unacceptable for Xpert Xpress  SARS-CoV-2/FLU/RSV testing.  Fact Sheet for Patients: EntrepreneurPulse.com.au  Fact Sheet for Healthcare Providers: IncredibleEmployment.be  This test is not yet approved or cleared by the [place] FDA and has been authorized for detection and/or diagnosis of SARS-CoV-2 by FDA under an Emergency Use Authorization (EUA). This EUA will remain in effect (meaning this test can be used) for the duration of the COVID-19 declaration under Section 564(b)(1) of the Act, 21 U.S.C. section 360bbb-3(b)(1), unless the authorization is terminated or revoked.     Resp Syncytial Virus by PCR NEGATIVE NEGATIVE Final    Comment: (NOTE) Fact Sheet for Patients: EntrepreneurPulse.com.au  Fact Sheet for Healthcare Providers: IncredibleEmployment.be  This test is not yet approved or cleared by the [place] FDA and has been authorized for detection and/or diagnosis of SARS-CoV-2 by FDA under an Emergency Use Authorization (EUA). This EUA will remain in effect (meaning this test can be used) for the duration of the COVID-19 declaration under Section 564(b)(1) of the Act, 21 U.S.C. section 360bbb-3(b)(1), unless the authorization is terminated or revoked.  Performed at [facility], [address], [place], [place] [address]       Radiology Studies: ECHOCARDIOGRAM COMPLETE  Result Date: 11/15/2022    ECHOCARDIOGRAM REPORT   Patient Name:   HANNIE [facility] Date of Exam: 11/15/2022 Medical Rec #:  [id number]   Height:       64.0 in Accession #:    [id number]  Weight:       154.5 lb Date of Birth:  1971-03-01   BSA:          1.753 m Patient Age:    8 years    BP:           140/57 mmHg Patient Gender: F           HR:           68 bpm. Exam Location:  Inpatient Procedure: 2D Echo, Cardiac Doppler and Color Doppler Indications:    Dyspnea  History:        Patient has prior history of Echocardiogram examinations, most  recent 12/16/2017. CHF; Risk Factors:Current Smoker,                 Hypertension, Diabetes and Dyslipidemia. Hx CVA.  Sonographer:    Clayton Lefort RDCS (AE) Referring Phys: [place]  1. Left ventricular ejection fraction, by estimation, is 55 to 60%. The left ventricle has normal function. The left ventricle has no regional wall motion abnormalities. There is mild left ventricular hypertrophy. Left ventricular diastolic parameters are consistent with Grade I diastolic dysfunction (impaired relaxation). Elevated left atrial pressure.  2. Right ventricular systolic function is normal. The right ventricular size is normal. Tricuspid regurgitation signal is inadequate for assessing PA pressure.  3. Left atrial size was mildly dilated.  4. The mitral valve is abnormal. Mild mitral valve regurgitation. No evidence of mitral stenosis.  5. The aortic valve is tricuspid. There is mild calcification of the aortic valve. There is mild thickening of the aortic valve. Aortic valve regurgitation is not visualized. No aortic stenosis is present.  6. The inferior vena cava is normal in size with <50% respiratory variability, suggesting right atrial pressure of 8 mmHg. FINDINGS  Left Ventricle: Left ventricular ejection fraction, by estimation, is 55 to 60%. The left ventricle has normal function. The left ventricle has no regional wall motion abnormalities. The left ventricular internal cavity size was normal in size. There is  mild left ventricular hypertrophy. Left ventricular diastolic parameters are consistent with Grade I diastolic dysfunction (impaired relaxation). Elevated left atrial pressure. Right Ventricle: The right ventricular size is normal. Right vetricular wall thickness was not well visualized. Right ventricular systolic function is normal. Tricuspid regurgitation signal is inadequate for assessing PA pressure. Left Atrium: Left atrial size was mildly dilated. Right Atrium: Right atrial size  was normal in size. Pericardium: There is no evidence of pericardial effusion. Mitral Valve: The mitral valve is abnormal. There is mild thickening of the mitral valve leaflet(s). There is mild calcification of the mitral valve leaflet(s). Mild mitral annular calcification. Mild mitral valve regurgitation. No evidence of mitral valve stenosis. MV peak gradient, 8.1 mmHg. The mean mitral valve gradient is 4.0 mmHg. Tricuspid Valve: The tricuspid valve is normal in structure. Tricuspid valve regurgitation is mild . No evidence of tricuspid stenosis. Aortic Valve: The aortic valve is tricuspid. There is mild calcification of the aortic valve. There is mild thickening of the aortic valve. There is mild aortic valve annular calcification. Aortic valve regurgitation is not visualized. No aortic stenosis  is present. Aortic valve mean gradient measures 5.9 mmHg. Aortic valve peak gradient measures 10.8 mmHg. Aortic valve area, by VTI measures 1.84 cm. Pulmonic Valve: The pulmonic valve was not well visualized. Pulmonic valve regurgitation is trivial. No evidence of pulmonic stenosis. Aorta: The aortic root is normal in size and structure. Venous: The inferior vena cava is normal in size with less than 50% respiratory variability, suggesting right atrial pressure of 8 mmHg. IAS/Shunts: No atrial level shunt detected by color flow Doppler.  LEFT VENTRICLE PLAX 2D LVIDd:         4.70 cm   Diastology LVIDs:         3.30 cm   LV e' medial:    6.20 cm/s LV PW:         1.00 cm   LV E/e' medial:  23.4 LV IVS:        1.00 cm   LV e' lateral:   7.18 cm/s LVOT diam:     2.10 cm  LV E/e' lateral: 20.2 LV SV:         76 LV SV Index:   43 LVOT Area:     3.46 cm  RIGHT VENTRICLE             IVC RV Basal diam:  3.30 cm     IVC diam: 1.70 cm RV S prime:     10.80 cm/s TAPSE (M-mode): 1.8 cm LEFT ATRIUM             Index        RIGHT ATRIUM           Index LA diam:        4.20 cm 2.40 cm/m   RA Area:     14.20 cm LA Vol (A2C):   60.0  ml 34.22 ml/m  RA Volume:   40.20 ml  22.93 ml/m LA Vol (A4C):   61.0 ml 34.79 ml/m LA Biplane Vol: 60.6 ml 34.56 ml/m  AORTIC VALVE AV Area (Vmax):    1.85 cm AV Area (Vmean):   1.84 cm AV Area (VTI):     1.84 cm AV Vmax:           164.25 cm/s AV Vmean:          116.422 cm/s AV VTI:            0.411 m AV Peak Grad:      10.8 mmHg AV Mean Grad:      5.9 mmHg LVOT Vmax:         87.80 cm/s LVOT Vmean:        61.700 cm/s LVOT VTI:          0.218 m LVOT/AV VTI ratio: 0.53  AORTA Ao Root diam: 2.80 cm Ao Asc diam:  2.90 cm MITRAL VALVE MV Area (PHT): 3.19 cm     SHUNTS MV Area VTI:   1.75 cm     Systemic VTI:  0.22 m MV Peak grad:  8.1 mmHg     Systemic Diam: 2.10 cm MV Mean grad:  4.0 mmHg MV Vmax:       1.42 m/s MV Vmean:      96.0 cm/s MV Decel Time: 238 msec MV E velocity: 145.00 cm/s MV A velocity: 113.00 cm/s MV E/A ratio:  1.28 Carlyle Dolly MD Electronically signed by Carlyle Dolly MD Signature Date/Time: 11/15/2022/9:41:35 AM    Final    DG Chest 2 View  Result Date: 11/14/2022 CLINICAL DATA:  Shortness of breath, cough, and chest pain EXAM: CHEST - 2 VIEW COMPARISON:  Chest radiograph dated 12/13/2021 FINDINGS: Normal lung volumes. Bilateral mid lung linear opacities. Bilateral interstitial opacities. Left basilar patchy opacities. Middle lobe and lingular atelectasis. No pleural effusion or pneumothorax. The heart size and mediastinal contours are within normal limits. The visualized skeletal structures are unremarkable. IMPRESSION: Bilateral interstitial opacities and left basilar patchy opacities, likely a combination of pulmonary edema and atelectasis. Electronically Signed   By: Darrin Nipper M.D.   On: 11/14/2022 11:58     Scheduled Meds:  amLODipine  10 mg Oral Daily   aspirin EC  81 mg Oral Q breakfast   atorvastatin  20 mg Oral q1800   dextromethorphan-guaiFENesin  1 tablet Oral BID   furosemide  20 mg Intravenous Q12H   heparin  5,000 Units Subcutaneous Q8H   insulin aspart  0-5  Units Subcutaneous QHS   insulin aspart  0-6 Units Subcutaneous TID WC   insulin glargine-yfgn  10 Units Subcutaneous  Daily   ipratropium-albuterol  3 mL Nebulization Q6H   lisinopril  40 mg Oral Daily   methylPREDNISolone (SOLU-MEDROL) injection  40 mg Intravenous Q12H   mometasone-formoterol  2 puff Inhalation BID   nicotine  21 mg Transdermal Daily   pantoprazole  40 mg Oral Daily   sodium chloride flush  3 mL Intravenous Q12H   sodium chloride flush  3 mL Intravenous Q12H   Continuous Infusions:  sodium chloride      LOS: 1 day   Roxan Hockey M.D on 11/15/2022 at 6:47 PM  Go to [email or web address] - for contact info  [facility] - Office  [phone]  If 7PM-7AM, please contact night-coverage [email or web address] 11/15/2022, 6:47 PM

## 2022-11-15 NOTE — Progress Notes (Signed)
   11/15/22 2152  Provider Notification  Provider Name/Title Dr Clearence Ped  Date Provider Notified 11/15/22  Time Provider Notified 2153  Method of Notification Page (secure chat,md preference)  Notification Reason Critical Result  Test performed and critical result Blood Glucose 423

## 2022-11-15 NOTE — Progress Notes (Signed)
Blood draw glucose is 415. MD notified.

## 2022-11-15 NOTE — Progress Notes (Signed)
Patient c/o headache around 0330. PRN tylenol given. Son at bedside.

## 2022-11-15 NOTE — Progress Notes (Signed)
  Echocardiogram 2D Echocardiogram has been performed.  Misty Mccullough 11/15/2022, 9:09 AM

## 2022-11-15 NOTE — TOC Progression Note (Signed)
  Transition of Care [facility]) Screening Note   Patient Details  Name: Austin Pongratz Date of Birth: 11/29/1970   Transition of Care [facility]) CM/SW Contact:    Shade Flood, LCSW Phone Number: 11/15/2022, 10:57 AM    Transition of Care Department [facility]) has reviewed patient and no TOC needs have been identified at this time. We will continue to monitor patient advancement through interdisciplinary progression rounds. If new patient transition needs arise, please place a TOC consult.

## 2022-11-15 NOTE — Inpatient Diabetes Management (Signed)
Inpatient Diabetes Program Recommendations  AACE/ADA: New Consensus Statement on Inpatient Glycemic Control  Target Ranges:  Prepandial:   less than 140 mg/dL      Peak postprandial:   less than 180 mg/dL (1-2 hours)      Critically ill patients:  140 - 180 mg/dL    Latest Reference Range & Units 11/14/22 16:22 11/14/22 22:02 11/15/22 07:23  Glucose-Capillary 70 - 99 mg/dL 113 (H) 248 (H) 386 (H)    Latest Reference Range & Units 11/14/22 11:15  Glucose 70 - 99 mg/dL 69 (L)   Review of Glycemic Control  Diabetes history: DM2 Outpatient Diabetes medications: Lantus 34 units QHS, Glipizide 10 mg BID, Metformin 1000 mg BID Current orders for Inpatient glycemic control: Novolog 0-6 units TID with meals, Novolog 0-5 units QHS; Solumedrol 40 mg Q12H  Inpatient Diabetes Program Recommendations:    Insulin: If steroids are continued, please consider ordering Semglee 15 units daily. If post prandial glucose is consistently over 180 mg/dl, consider ordering Novolog 3 units TID with meals for meal coverage if patient eats at least 50% of meals.  Thanks, Barnie Alderman, RN, MSN, [place] Diabetes Coordinator Inpatient Diabetes Program [phone] (Team Pager from 8am to [place])

## 2022-11-16 DIAGNOSIS — F17200 Nicotine dependence, unspecified, uncomplicated: Secondary | ICD-10-CM | POA: Diagnosis not present

## 2022-11-16 DIAGNOSIS — E119 Type 2 diabetes mellitus without complications: Secondary | ICD-10-CM | POA: Diagnosis not present

## 2022-11-16 DIAGNOSIS — Z8673 Personal history of transient ischemic attack (TIA), and cerebral infarction without residual deficits: Secondary | ICD-10-CM | POA: Diagnosis not present

## 2022-11-16 DIAGNOSIS — I5032 Chronic diastolic (congestive) heart failure: Secondary | ICD-10-CM | POA: Diagnosis present

## 2022-11-16 DIAGNOSIS — I10 Essential (primary) hypertension: Secondary | ICD-10-CM | POA: Diagnosis not present

## 2022-11-16 DIAGNOSIS — I5033 Acute on chronic diastolic (congestive) heart failure: Secondary | ICD-10-CM | POA: Diagnosis present

## 2022-11-16 LAB — GLUCOSE, CAPILLARY
Glucose-Capillary: 395 mg/dL — ABNORMAL HIGH (ref 70–99)
Glucose-Capillary: 315 mg/dL — ABNORMAL HIGH (ref 70–99)
Glucose-Capillary: 328 mg/dL — ABNORMAL HIGH (ref 70–99)
Glucose-Capillary: 286 mg/dL — ABNORMAL HIGH (ref 70–99)
Glucose-Capillary: 299 mg/dL — ABNORMAL HIGH (ref 70–99)

## 2022-11-16 MED ORDER — ALBUTEROL SULFATE (2.5 MG/3ML) 0.083% IN NEBU: 2.5000 mg | INHALATION_SOLUTION | RESPIRATORY_TRACT | 2 refills | Status: DC | PRN

## 2022-11-16 MED ORDER — SPIRIVA RESPIMAT 2.5 MCG/ACT IN AERS: 2.0000 | INHALATION_SPRAY | Freq: Every day | RESPIRATORY_TRACT | 4 refills | Status: DC

## 2022-11-16 MED ORDER — PREDNISONE 20 MG PO TABS: 40.0000 mg | ORAL_TABLET | Freq: Every day | ORAL | 0 refills | Status: AC

## 2022-11-16 MED ORDER — INSULIN ASPART 100 UNIT/ML IJ SOLN
0.0000 [IU] | Freq: Three times a day (TID) | INTRAMUSCULAR | Status: DC
  Administered 2022-11-16: 15 [IU] via SUBCUTANEOUS
  Administered 2022-11-16: 11 [IU] via SUBCUTANEOUS

## 2022-11-16 MED ORDER — DM-GUAIFENESIN ER 30-600 MG PO TB12: 1.0000 | ORAL_TABLET | Freq: Two times a day (BID) | ORAL | 1 refills | Status: DC

## 2022-11-16 MED ORDER — INSULIN GLARGINE 100 UNIT/ML SOLOSTAR PEN: 31.0000 [IU] | PEN_INJECTOR | Freq: Every day | SUBCUTANEOUS | 2 refills | Status: DC

## 2022-11-16 MED ORDER — AMLODIPINE BESYLATE 10 MG PO TABS: 10.0000 mg | ORAL_TABLET | Freq: Every day | ORAL | 3 refills | Status: DC

## 2022-11-16 MED ORDER — ASPIRIN 81 MG PO TBEC: 81.0000 mg | DELAYED_RELEASE_TABLET | Freq: Every day | ORAL | 12 refills | Status: AC

## 2022-11-16 MED ORDER — OMEPRAZOLE 20 MG PO CPDR: 20.0000 mg | DELAYED_RELEASE_CAPSULE | Freq: Every day | ORAL | 3 refills | Status: DC

## 2022-11-16 MED ORDER — FUROSEMIDE 20 MG PO TABS: 20.0000 mg | ORAL_TABLET | Freq: Every day | ORAL | 11 refills | Status: DC

## 2022-11-16 MED ORDER — LISINOPRIL 40 MG PO TABS: 40.0000 mg | ORAL_TABLET | Freq: Every day | ORAL | 3 refills | Status: DC

## 2022-11-16 MED ORDER — NICOTINE 14 MG/24HR TD PT24: 14.0000 mg | MEDICATED_PATCH | Freq: Every day | TRANSDERMAL | 2 refills | Status: DC

## 2022-11-16 MED ORDER — INSULIN GLARGINE-YFGN 100 UNIT/ML ~~LOC~~ SOLN
20.0000 [IU] | Freq: Every day | SUBCUTANEOUS | Status: DC
  Administered 2022-11-16: 20 [IU] via SUBCUTANEOUS
  Filled 2022-11-16: qty 0.2

## 2022-11-16 MED ORDER — FLUTICASONE-SALMETEROL 100-50 MCG/ACT IN AEPB: 1.0000 | INHALATION_SPRAY | Freq: Two times a day (BID) | RESPIRATORY_TRACT | 3 refills | Status: DC

## 2022-11-16 MED ORDER — THIAMINE HCL 100 MG PO TABS: 100.0000 mg | ORAL_TABLET | Freq: Every day | ORAL | 3 refills | Status: DC

## 2022-11-16 MED ORDER — COMPRESSOR/NEBULIZER MISC: 1.0000 [IU] | 0 refills | Status: AC | PRN

## 2022-11-16 MED ORDER — METFORMIN HCL 1000 MG PO TABS: 1000.0000 mg | ORAL_TABLET | Freq: Two times a day (BID) | ORAL | 5 refills | Status: DC

## 2022-11-16 MED ORDER — INSULIN ASPART 100 UNIT/ML IJ SOLN
6.0000 [IU] | Freq: Once | INTRAMUSCULAR | Status: AC
  Administered 2022-11-16: 6 [IU] via SUBCUTANEOUS

## 2022-11-16 NOTE — Progress Notes (Signed)
Patient is currently resting in her bed at this time. Given 1 prn medication during this shift. See MAR. Blood glucose elevated most of this shift.MD aware and new orders received. Plan of care ongoing.

## 2022-11-16 NOTE — Discharge Instructions (Signed)
1) please stop HCTZ/hydrochlorothiazide 2) take Lasix/furosemide 20 mg daily--May take Additional tablet ( 20 mg) if you gain more than 3 pounds in 1 day or more than 5 pounds in 1 week 3) please stop glipizide 4)Avoid ibuprofen/Advil/Aleve/Motrin/Goody Powders/Naproxen/BC powders/Meloxicam/Diclofenac/Indomethacin and other Nonsteroidal anti-inflammatory medications as these will make you more likely to bleed and can cause stomach ulcers, can also cause Kidney problems.

## 2022-11-16 NOTE — Progress Notes (Signed)
SATURATION QUALIFICATIONS: (This note is used to comply with regulatory documentation for home oxygen)  Patient Saturations on Room Air at Rest = 98%  Patient Saturations on Room Air while Ambulating = 100%  Patient Saturations on 2 Liters of oxygen while Ambulating = 100%  Please briefly explain why patient needs home oxygen: Pt does not qualify for home oxygen at this time.

## 2022-11-16 NOTE — Discharge Summary (Signed)
Misty Mccullough, is a 52 y.o. female  DOB [birth year]  MRN [id number].  Admission date:  11/14/2022  Admitting Physician  Roxan Hockey, MD  Discharge Date:  11/16/2022   Primary MD  Bo Merino I, NP  Recommendations for primary care physician for things to follow:   1)Please Stop HCTZ/hydrochlorothiazide 2)Take Lasix/Furosemide 20 mg daily--May take Additional tablet ( 20 mg) if you gain more than 3 pounds in 1 day or more than 5 pounds in 1 week 3)Please Stop Glipizide 4)Avoid ibuprofen/Advil/Aleve/Motrin/Goody Powders/Naproxen/BC powders/Meloxicam/Diclofenac/Indomethacin and other Nonsteroidal anti-inflammatory medications as these will make you more likely to bleed and can cause stomach ulcers, can also cause Kidney problems.   Admission Diagnosis  Acute exacerbation of CHF (congestive heart failure) (HCC) [I50.9]   Discharge Diagnosis  Acute exacerbation of CHF (congestive heart failure) (HCC) [I50.9]    Principal Problem:   Acute exacerbation of Diastolic CHF (congestive heart failure) (HCC) Active Problems:   H/O: CVA (cerebrovascular accident)   Essential hypertension   HLD (hyperlipidemia)   Diabetes mellitus with complication (HCC)   Tobacco use   Tobacco dependence   Type 2 diabetes mellitus without complication, without long-term current use of insulin (HCC)      Past Medical History:  Diagnosis Date   Bilateral lower extremity edema    Cough    Diabetes mellitus without complication (HCC)    Gallstones    Heavy cigarette smoker    Hemoglobin A1C between 7% and 9% indicating borderline diabetic control ([place]) 08/2019   Hypercholesteremia    Hyperglycemia    Hypertension    Shortness of breath    Vitamin D deficiency 12/2020    Past Surgical History:  Procedure Laterality Date   TUBAL LIGATION      HPI  from the history and physical done on the day of admission:  Misty Mccullough  is a 52 y.o. female active smoker with past medical history relevant for history of prior CVA, DM2,, HTN and HLD, and GERD presents to the ED with concerns about chest discomfort and dyspnea that started on 11/13/2022... Symptoms started in the evening of 11/13/2022 while watching TV laying down... -- Dyspnea and chest discomfort is not exertional, -Chest discomfort radiates to jaw - Patient is a smoker with COPD endorses cough with mostly clear sputum -No fevers no chills -She does have some lower extremity edema but no calf pain no pleuritic type symptoms   No Nausea, Vomiting or Diarrhea Chest x-ray with CHF findings/pulmonary edema -BNP elevated at 333 -In the troponin is 6, repeat troponin 6 -EKG sinus rhythm with nonspecific ST and T wave changes, QT is prolonged -Magnesium is 2.0, potassium is 3.8, creatinine 0.77 LFTs are not elevated -COVID, flu and RSV negative -UA is not suggestive of UTI -WBC is 3.4 Hgb is 8.4 platelets clumped     Hospital Course:   Brief Narrative:   52 y.o. female active smoker with past medical history relevant for history of prior CVA, DM2,, HTN and HLD, and GERD presents to  the ED with concerns about chest discomfort and dyspnea was admitted on 11/14/2022 with acute dCHF exacerbation     -Assessment and Plan: 1) acute diastolic CHF exacerbation---POA.... --COVID, flu and RSV negative -Chest x-ray with CHF findings/pulmonary edema -BNP elevated at 333 -In the troponin is 6, repeat troponin 6 --EKG sinus rhythm with nonspecific ST and T wave changes, QT is prolonged -Echo from 12/16/2017 with EF of 60 to 65%,, grade 1 diastolic dysfunction --No further chest pains -Dyspnea improving -Echo from 11/15/2022 with EF is 55 to [id number], grade 1 diastolic dysfunction noted -No wall motion abnormalities -Responded well to IV Lasix -Continue oral Lasix   2)H/o Prior CVA- pt had right thalamic lacunar infarct in March 2019 continue aspirin and Lipitor for  secondary prevention   3)HTN--stage II--- restarted amlodipine and lisinopril   4)Tobacco abuse and COPD--- patient smokes more than 1 pack a day -Treated with Solu-Medrol, mucolytics and bronchodilators as ordered -Discharge on p.o. prednisone -Smoking cessation advised -Nicotine patch   5)DM2--history of uncontrolled DM with persistent hyperglycemia A1c 7.8 reflecting uncontrolled diabetes with hyperglycemia PTA -c/n  Glargine insulin   6)GERD--Protonix especially while on steroids   7) chronic anemia--- Hgb was 10.8 in February 2023 and 11.0 in March 2022 -No acute bleeding concerns at this time [facility] is borderline low MCV is WNL -Hgb currently greater than 8   8) acute hypoxic respiratory failure--- secondary to 1 above -Hypoxia resolved completely -Patient ambulated in the hallways and O2 sats is above 98% on room air    Status is: Inpatient    Disposition: The patient is from: Home              Anticipated d/c is to: Home  Discharge Condition: stable Follow UP   Follow-up Information     Bo Merino I, NP. Schedule an appointment as soon as possible for a visit in 1 week(s).   Specialty: Nurse Practitioner Contact information: [address] [place], [place] [address] [phone]                Diet and Activity recommendation:  As advised  Discharge Instructions    Discharge Instructions     Ambulatory Referral for Lung Cancer Scre   Complete by: As directed    Call MD for:  difficulty breathing, headache or visual disturbances   Complete by: As directed    Call MD for:  persistant dizziness or light-headedness   Complete by: As directed    Call MD for:  persistant nausea and vomiting   Complete by: As directed    Call MD for:  temperature >100.4   Complete by: As directed    Diet - low sodium heart healthy   Complete by: As directed    Diet Carb Modified   Complete by: As directed    Discharge instructions   Complete by: As directed     1) please stop HCTZ/hydrochlorothiazide 2) take Lasix/furosemide 20 mg daily--May take Additional tablet ( 20 mg) if you gain more than 3 pounds in 1 day or more than 5 pounds in 1 week 3) please stop glipizide 4)Avoid ibuprofen/Advil/Aleve/Motrin/Goody Powders/Naproxen/BC powders/Meloxicam/Diclofenac/Indomethacin and other Nonsteroidal anti-inflammatory medications as these will make you more likely to bleed and can cause stomach ulcers, can also cause Kidney problems.   For home use only DME Nebulizer machine   Complete by: As directed    Patient needs a nebulizer to treat with the following condition: COPD (chronic obstructive pulmonary disease) ([place])  Length of Need: Lifetime   Increase activity slowly   Complete by: As directed        Discharge Medications     Allergies as of 11/16/2022   No Known Allergies      Medication List     STOP taking these medications    glipiZIDE 10 MG tablet Commonly known as: GLUCOTROL   hydrochlorothiazide 25 MG tablet Commonly known as: HYDRODIURIL   traZODone 100 MG tablet Commonly known as: DESYREL       TAKE these medications    albuterol 108 (90 Base) MCG/ACT inhaler Commonly known as: VENTOLIN HFA Inhale 2 puffs into the lungs every 6 (six) hours as needed for wheezing or shortness of breath. What changed: Another medication with the same name was added. Make sure you understand how and when to take each.   albuterol (2.5 MG/3ML) 0.083% nebulizer solution Commonly known as: PROVENTIL Take 3 mLs (2.5 mg total) by nebulization every 4 (four) hours as needed for wheezing or shortness of breath. What changed: You were already taking a medication with the same name, and this prescription was added. Make sure you understand how and when to take each.   ALPRAZolam 0.5 MG tablet Commonly known as: Xanax Take 1 tablet (0.5 mg total) by mouth 2 (two) times daily as needed for anxiety.   amLODipine 10 MG tablet Commonly known  as: NORVASC Take 1 tablet (10 mg total) by mouth daily.   aspirin EC 81 MG tablet Take 1 tablet (81 mg total) by mouth daily with breakfast. Swallow whole. Start taking on: November 17, 2022 What changed:  medication strength how much to take when to take this additional instructions   atorvastatin 10 MG tablet Commonly known as: LIPITOR TAKE 1 TABLET (10 MG TOTAL) BY MOUTH DAILY.   BD ULTRA-FINE PEN NEEDLES 29G X 12.7MM Misc Generic drug: Insulin Pen Needle ICD10 E11.9 for use with insulin administration   blood glucose meter kit and supplies Kit Dispense based on patient and insurance preference. Use up to four times daily as directed.   Compressor/Nebulizer Misc 1 Units by Does not apply route as needed.   dextromethorphan-guaiFENesin 30-600 MG 12hr tablet Commonly known as: MUCINEX DM Take 1 tablet by mouth 2 (two) times daily.   fluticasone-salmeterol 100-50 MCG/ACT Aepb Commonly known as: ADVAIR Inhale 1 puff into the lungs 2 (two) times daily.   furosemide 20 MG tablet Commonly known as: LASIX Take 1 tablet (20 mg total) by mouth daily. May take Additional tablet ( 20 mg) if you gain more than 3 pounds in 1 day or more than 5 pounds in 1 week What changed:  how much to take how to take this when to take this additional instructions   insulin glargine 100 UNIT/ML Solostar Pen Commonly known as: LANTUS Inject 31 Units into the skin at bedtime. What changed: how much to take   Insulin Syringes (Disposable) U-100 0.3 ML Misc 1 each by Does not apply route at bedtime.   lisinopril 40 MG tablet Commonly known as: ZESTRIL Take 1 tablet (40 mg total) by mouth daily.   metFORMIN 1000 MG tablet Commonly known as: GLUCOPHAGE Take 1 tablet (1,000 mg total) by mouth 2 (two) times daily with a meal. What changed: how much to take   nicotine 14 mg/24hr patch Commonly known as: NICODERM CQ - dosed in mg/24 hours Place 1 patch (14 mg total) onto the skin daily.    omeprazole 20 MG capsule Commonly known as:  PRILOSEC Take 1 capsule (20 mg total) by mouth daily. What changed:  medication strength how much to take   onetouch ultrasoft lancets Use as instructed   predniSONE 20 MG tablet Commonly known as: DELTASONE Take 2 tablets (40 mg total) by mouth daily with breakfast for 5 days.   Spiriva Respimat 2.5 MCG/ACT Aers Generic drug: Tiotropium Bromide Monohydrate Inhale 2 puffs into the lungs daily.   thiamine 100 MG tablet Commonly known as: VITAMIN B1 Take 1 tablet (100 mg total) by mouth daily.               Durable Medical Equipment  (From admission, onward)           Start     Ordered   11/16/22 0000  For home use only DME Nebulizer machine       Question Answer Comment  Patient needs a nebulizer to treat with the following condition COPD (chronic obstructive pulmonary disease) ([place])   Length of Need Lifetime      11/16/22 1414           Major procedures and Radiology Reports - PLEASE review detailed and final reports for all details, in brief -   ECHOCARDIOGRAM COMPLETE  Result Date: 11/15/2022    ECHOCARDIOGRAM REPORT   Patient Name:   Misty Mccullough [facility] Date of Exam: 11/15/2022 Medical Rec #:  [id number]   Height:       64.0 in Accession #:    [id number]  Weight:       154.5 lb Date of Birth:  1970-12-23   BSA:          1.753 m Patient Age:    40 years    BP:           140/57 mmHg Patient Gender: F           HR:           68 bpm. Exam Location:  Inpatient Procedure: 2D Echo, Cardiac Doppler and Color Doppler Indications:    Dyspnea  History:        Patient has prior history of Echocardiogram examinations, most                 recent 12/16/2017. CHF; Risk Factors:Current Smoker,                 Hypertension, Diabetes and Dyslipidemia. Hx CVA.  Sonographer:    Clayton Lefort RDCS (AE) Referring Phys: [place]  1. Left ventricular ejection fraction, by estimation, is 55 to 60%. The left ventricle has normal  function. The left ventricle has no regional wall motion abnormalities. There is mild left ventricular hypertrophy. Left ventricular diastolic parameters are consistent with Grade I diastolic dysfunction (impaired relaxation). Elevated left atrial pressure.  2. Right ventricular systolic function is normal. The right ventricular size is normal. Tricuspid regurgitation signal is inadequate for assessing PA pressure.  3. Left atrial size was mildly dilated.  4. The mitral valve is abnormal. Mild mitral valve regurgitation. No evidence of mitral stenosis.  5. The aortic valve is tricuspid. There is mild calcification of the aortic valve. There is mild thickening of the aortic valve. Aortic valve regurgitation is not visualized. No aortic stenosis is present.  6. The inferior vena cava is normal in size with <50% respiratory variability, suggesting right atrial pressure of 8 mmHg. FINDINGS  Left Ventricle: Left ventricular ejection fraction, by estimation, is 55 to 60%. The left ventricle has normal function. The left ventricle  has no regional wall motion abnormalities. The left ventricular internal cavity size was normal in size. There is  mild left ventricular hypertrophy. Left ventricular diastolic parameters are consistent with Grade I diastolic dysfunction (impaired relaxation). Elevated left atrial pressure. Right Ventricle: The right ventricular size is normal. Right vetricular wall thickness was not well visualized. Right ventricular systolic function is normal. Tricuspid regurgitation signal is inadequate for assessing PA pressure. Left Atrium: Left atrial size was mildly dilated. Right Atrium: Right atrial size was normal in size. Pericardium: There is no evidence of pericardial effusion. Mitral Valve: The mitral valve is abnormal. There is mild thickening of the mitral valve leaflet(s). There is mild calcification of the mitral valve leaflet(s). Mild mitral annular calcification. Mild mitral valve  regurgitation. No evidence of mitral valve stenosis. MV peak gradient, 8.1 mmHg. The mean mitral valve gradient is 4.0 mmHg. Tricuspid Valve: The tricuspid valve is normal in structure. Tricuspid valve regurgitation is mild . No evidence of tricuspid stenosis. Aortic Valve: The aortic valve is tricuspid. There is mild calcification of the aortic valve. There is mild thickening of the aortic valve. There is mild aortic valve annular calcification. Aortic valve regurgitation is not visualized. No aortic stenosis  is present. Aortic valve mean gradient measures 5.9 mmHg. Aortic valve peak gradient measures 10.8 mmHg. Aortic valve area, by VTI measures 1.84 cm. Pulmonic Valve: The pulmonic valve was not well visualized. Pulmonic valve regurgitation is trivial. No evidence of pulmonic stenosis. Aorta: The aortic root is normal in size and structure. Venous: The inferior vena cava is normal in size with less than 50% respiratory variability, suggesting right atrial pressure of 8 mmHg. IAS/Shunts: No atrial level shunt detected by color flow Doppler.  LEFT VENTRICLE PLAX 2D LVIDd:         4.70 cm   Diastology LVIDs:         3.30 cm   LV e' medial:    6.20 cm/s LV PW:         1.00 cm   LV E/e' medial:  23.4 LV IVS:        1.00 cm   LV e' lateral:   7.18 cm/s LVOT diam:     2.10 cm   LV E/e' lateral: 20.2 LV SV:         76 LV SV Index:   43 LVOT Area:     3.46 cm  RIGHT VENTRICLE             IVC RV Basal diam:  3.30 cm     IVC diam: 1.70 cm RV S prime:     10.80 cm/s TAPSE (M-mode): 1.8 cm LEFT ATRIUM             Index        RIGHT ATRIUM           Index LA diam:        4.20 cm 2.40 cm/m   RA Area:     14.20 cm LA Vol (A2C):   60.0 ml 34.22 ml/m  RA Volume:   40.20 ml  22.93 ml/m LA Vol (A4C):   61.0 ml 34.79 ml/m LA Biplane Vol: 60.6 ml 34.56 ml/m  AORTIC VALVE AV Area (Vmax):    1.85 cm AV Area (Vmean):   1.84 cm AV Area (VTI):     1.84 cm AV Vmax:           164.25 cm/s AV Vmean:  116.422 cm/s AV VTI:             0.411 m AV Peak Grad:      10.8 mmHg AV Mean Grad:      5.9 mmHg LVOT Vmax:         87.80 cm/s LVOT Vmean:        61.700 cm/s LVOT VTI:          0.218 m LVOT/AV VTI ratio: 0.53  AORTA Ao Root diam: 2.80 cm Ao Asc diam:  2.90 cm MITRAL VALVE MV Area (PHT): 3.19 cm     SHUNTS MV Area VTI:   1.75 cm     Systemic VTI:  0.22 m MV Peak grad:  8.1 mmHg     Systemic Diam: 2.10 cm MV Mean grad:  4.0 mmHg MV Vmax:       1.42 m/s MV Vmean:      96.0 cm/s MV Decel Time: 238 msec MV E velocity: 145.00 cm/s MV A velocity: 113.00 cm/s MV E/A ratio:  1.28 Carlyle Dolly MD Electronically signed by Carlyle Dolly MD Signature Date/Time: 11/15/2022/9:41:35 AM    Final    DG Chest 2 View  Result Date: 11/14/2022 CLINICAL DATA:  Shortness of breath, cough, and chest pain EXAM: CHEST - 2 VIEW COMPARISON:  Chest radiograph dated 12/13/2021 FINDINGS: Normal lung volumes. Bilateral mid lung linear opacities. Bilateral interstitial opacities. Left basilar patchy opacities. Middle lobe and lingular atelectasis. No pleural effusion or pneumothorax. The heart size and mediastinal contours are within normal limits. The visualized skeletal structures are unremarkable. IMPRESSION: Bilateral interstitial opacities and left basilar patchy opacities, likely a combination of pulmonary edema and atelectasis. Electronically Signed   By: Darrin Nipper M.D.   On: 11/14/2022 11:58    Micro Results   Recent Results (from the past 240 hour(s))  Resp panel by RT-PCR (RSV, Flu A&B, Covid) Anterior Nasal Swab     Status: None   Collection Time: 11/14/22  1:25 PM   Specimen: Anterior Nasal Swab  Result Value Ref Range Status   SARS Coronavirus 2 by RT PCR NEGATIVE NEGATIVE Final    Comment: (NOTE) SARS-CoV-2 target nucleic acids are NOT DETECTED.  The SARS-CoV-2 RNA is generally detectable in upper respiratory specimens during the acute phase of infection. The lowest concentration of SARS-CoV-2 viral copies this assay can detect is 138  copies/mL. A negative result does not preclude SARS-Cov-2 infection and should not be used as the sole basis for treatment or other patient management decisions. A negative result may occur with  improper specimen collection/handling, submission of specimen other than nasopharyngeal swab, presence of viral mutation(s) within the areas targeted by this assay, and inadequate number of viral copies(<138 copies/mL). A negative result must be combined with clinical observations, patient history, and epidemiological information. The expected result is Negative.  Fact Sheet for Patients:  EntrepreneurPulse.com.au  Fact Sheet for Healthcare Providers:  IncredibleEmployment.be  This test is no t yet approved or cleared by the [place] FDA and  has been authorized for detection and/or diagnosis of SARS-CoV-2 by FDA under an Emergency Use Authorization (EUA). This EUA will remain  in effect (meaning this test can be used) for the duration of the COVID-19 declaration under Section 564(b)(1) of the Act, 21 U.S.C.section 360bbb-3(b)(1), unless the authorization is terminated  or revoked sooner.       Influenza A by PCR NEGATIVE NEGATIVE Final   Influenza B by PCR NEGATIVE NEGATIVE Final    Comment: (NOTE) The  Xpert Xpress SARS-CoV-2/FLU/RSV plus assay is intended as an aid in the diagnosis of influenza from Nasopharyngeal swab specimens and should not be used as a sole basis for treatment. Nasal washings and aspirates are unacceptable for Xpert Xpress SARS-CoV-2/FLU/RSV testing.  Fact Sheet for Patients: EntrepreneurPulse.com.au  Fact Sheet for Healthcare Providers: IncredibleEmployment.be  This test is not yet approved or cleared by the [place] FDA and has been authorized for detection and/or diagnosis of SARS-CoV-2 by FDA under an Emergency Use Authorization (EUA). This EUA will remain in effect (meaning  this test can be used) for the duration of the COVID-19 declaration under Section 564(b)(1) of the Act, 21 U.S.C. section 360bbb-3(b)(1), unless the authorization is terminated or revoked.     Resp Syncytial Virus by PCR NEGATIVE NEGATIVE Final    Comment: (NOTE) Fact Sheet for Patients: EntrepreneurPulse.com.au  Fact Sheet for Healthcare Providers: IncredibleEmployment.be  This test is not yet approved or cleared by the [place] FDA and has been authorized for detection and/or diagnosis of SARS-CoV-2 by FDA under an Emergency Use Authorization (EUA). This EUA will remain in effect (meaning this test can be used) for the duration of the COVID-19 declaration under Section 564(b)(1) of the Act, 21 U.S.C. section 360bbb-3(b)(1), unless the authorization is terminated or revoked.  Performed at [facility], [address], [place], [place] [address]     Today   Subjective    Teale Yock today has no new complaints No fever  Or chills  -Cough, shortness of breath and wheezing is improved significantly -Hypoxia has resolved patient ambulated in the hallways and O2 sats was 98% on room air after ambulating    Patient has been seen and examined prior to discharge   Objective   Blood pressure (!) 141/64, pulse 81, temperature (!) 97.5 F (36.4 C), temperature source Oral, resp. rate (!) 22, height 5' 4"$  (1.626 m), weight 70.4 kg, SpO2 97 %.   Intake/Output Summary (Last 24 hours) at 11/16/2022 1417 Last data filed at 11/15/2022 2215 Gross per 24 hour  Intake 120 ml  Output --  Net 120 ml    Exam Gen:- Awake Alert, no acute distress , no conversational dyspnea no dyspnea on exertion HEENT:- [place].AT, No sclera icterus Neck-Supple Neck,No JVD,.  Lungs-  CTAB , good air movement bilaterally CV- S1, S2 normal, regular Abd-  +ve B.Sounds, Abd Soft, No tenderness,    Extremity/Skin:- No  edema,   good pulses Psych-affect is appropriate,  oriented x3 Neuro-no new focal deficits, no tremors    Data Review   CBC w Diff:  Lab Results  Component Value Date   WBC 3.9 (L) 11/15/2022   HGB 8.1 (L) 11/15/2022   HGB 10.8 (L) 11/30/2021   HCT 26.3 (L) 11/15/2022   HCT 33.4 (L) 11/30/2021   PLT 200 11/15/2022   PLT 244 11/30/2021   LYMPHOPCT 29 11/14/2022   MONOPCT 7 11/14/2022   EOSPCT 3 11/14/2022   BASOPCT 1 11/14/2022    CMP:  Lab Results  Component Value Date   NA 135 11/15/2022   NA 145 (H) 11/30/2021   K 3.9 11/15/2022   CL 103 11/15/2022   CO2 24 11/15/2022   BUN 12 11/15/2022   BUN 8 11/30/2021   CREATININE 0.83 11/15/2022   CREATININE 0.62 03/07/2017   PROT 7.7 11/14/2022   PROT 6.6 11/30/2021   ALBUMIN 3.5 11/14/2022   ALBUMIN 4.0 11/30/2021   BILITOT 0.7 11/14/2022   BILITOT 0.2 11/30/2021   ALKPHOS 89  11/14/2022   AST 36 11/14/2022   ALT 30 11/14/2022  .  Total Discharge time is about 33 minutes  Roxan Hockey M.D on 11/16/2022 at 2:17 PM  Go to [email or web address] -  for contact info  [facility] - Office  [phone]

## 2022-11-19 LAB — CBC WITH DIFFERENTIAL/PLATELET
Abs Immature Granulocytes: 0.02 10*3/uL (ref 0.00–0.07)
Basophils Absolute: 0 10*3/uL (ref 0.0–0.1)
Basophils Relative: 1 %
Eosinophils Absolute: 0.1 10*3/uL (ref 0.0–0.5)
Eosinophils Relative: 3 %
HCT: 27.6 % — ABNORMAL LOW (ref 36.0–46.0)
Hemoglobin: 8.4 g/dL — ABNORMAL LOW (ref 12.0–15.0)
Immature Granulocytes: 1 %
Lymphocytes Relative: 29 %
Lymphs Abs: 1 10*3/uL (ref 0.7–4.0)
MCH: 25.6 pg — ABNORMAL LOW (ref 26.0–34.0)
MCHC: 30.4 g/dL (ref 30.0–36.0)
MCV: 84.1 fL (ref 80.0–100.0)
Monocytes Absolute: 0.2 10*3/uL (ref 0.1–1.0)
Monocytes Relative: 7 %
Neutro Abs: 2.1 10*3/uL (ref 1.7–7.7)
Neutrophils Relative %: 59 %
Platelets: ADEQUATE 10*3/uL (ref 150–400)
RBC: 3.28 MIL/uL — ABNORMAL LOW (ref 3.87–5.11)
RDW: 17.7 % — ABNORMAL HIGH (ref 11.5–15.5)
Smear Review: ADEQUATE
WBC: 3.4 10*3/uL — ABNORMAL LOW (ref 4.0–10.5)
nRBC: 0 % (ref 0.0–0.2)

## 2022-12-06 DIAGNOSIS — J42 Unspecified chronic bronchitis: Secondary | ICD-10-CM | POA: Diagnosis not present

## 2022-12-18 ENCOUNTER — Ambulatory Visit (INDEPENDENT_AMBULATORY_CARE_PROVIDER_SITE_OTHER): Payer: Medicaid Other | Admitting: Nurse Practitioner

## 2022-12-18 ENCOUNTER — Encounter: Payer: Self-pay | Admitting: Nurse Practitioner

## 2022-12-18 VITALS — BP 137/52 | HR 93 | Temp 98.0°F | Wt 165.2 lb

## 2022-12-18 DIAGNOSIS — Z1211 Encounter for screening for malignant neoplasm of colon: Secondary | ICD-10-CM | POA: Diagnosis not present

## 2022-12-18 DIAGNOSIS — E119 Type 2 diabetes mellitus without complications: Secondary | ICD-10-CM | POA: Diagnosis not present

## 2022-12-18 DIAGNOSIS — I5033 Acute on chronic diastolic (congestive) heart failure: Secondary | ICD-10-CM | POA: Diagnosis not present

## 2022-12-18 DIAGNOSIS — Z13220 Encounter for screening for lipoid disorders: Secondary | ICD-10-CM | POA: Diagnosis not present

## 2022-12-18 DIAGNOSIS — F17200 Nicotine dependence, unspecified, uncomplicated: Secondary | ICD-10-CM

## 2022-12-18 DIAGNOSIS — J811 Chronic pulmonary edema: Secondary | ICD-10-CM

## 2022-12-18 DIAGNOSIS — J441 Chronic obstructive pulmonary disease with (acute) exacerbation: Secondary | ICD-10-CM

## 2022-12-18 DIAGNOSIS — Z1231 Encounter for screening mammogram for malignant neoplasm of breast: Secondary | ICD-10-CM | POA: Diagnosis not present

## 2022-12-18 LAB — POCT GLYCOSYLATED HEMOGLOBIN (HGB A1C): Hemoglobin A1C: 7.9 % — AB (ref 4.0–5.6)

## 2022-12-18 MED ORDER — PREDNISONE 20 MG PO TABS: 20.0000 mg | ORAL_TABLET | Freq: Every day | ORAL | 0 refills | Status: DC

## 2022-12-18 NOTE — Progress Notes (Signed)
$'@Patient'R$  ID: Misty Mccullough, female    DOB: 02-14-1971, 52 y.o.   MRN: [id number]  Chief Complaint  Patient presents with   Hospitalization Follow-up    Referring provider: No ref. provider found  Recent significant Events:  Hospital admission: 11/14/22 - 11/16/22   Hospital course:   52 y.o. female active smoker with past medical history relevant for history of prior CVA, DM2,, HTN and HLD, and GERD presents to the ED with concerns about chest discomfort and dyspnea was admitted on 11/14/2022 with acute dCHF exacerbation     -Assessment and Plan: 1) acute diastolic CHF exacerbation---POA.... --COVID, flu and RSV negative -Chest x-ray with CHF findings/pulmonary edema -BNP elevated at 333 -In the troponin is 6, repeat troponin 6 --EKG sinus rhythm with nonspecific ST and T wave changes, QT is prolonged -Echo from 12/16/2017 with EF of 60 to 65%,, grade 1 diastolic dysfunction --No further chest pains -Dyspnea improving -Echo from 11/15/2022 with EF is 55 to [id number], grade 1 diastolic dysfunction noted -No wall motion abnormalities -Responded well to IV Lasix -Continue oral Lasix   2)H/o Prior CVA- pt had right thalamic lacunar infarct in March 2019 continue aspirin and Lipitor for secondary prevention   3)HTN--stage II--- restarted amlodipine and lisinopril   4)Tobacco abuse and COPD--- patient smokes more than 1 pack a day -Treated with Solu-Medrol, mucolytics and bronchodilators as ordered -Discharge on p.o. prednisone -Smoking cessation advised -Nicotine patch   5)DM2--history of uncontrolled DM with persistent hyperglycemia A1c 7.8 reflecting uncontrolled diabetes with hyperglycemia PTA -c/n  Glargine insulin   6)GERD--Protonix especially while on steroids   7) chronic anemia--- Hgb was 10.8 in February 2023 and 11.0 in March 2022 -No acute bleeding concerns at this time [facility] is borderline low MCV is WNL -Hgb currently greater than 8   8) acute hypoxic respiratory  failure--- secondary to 1 above -Hypoxia resolved completely -Patient ambulated in the hallways and O2 sats is above 98% on room air    HPI   Patient presents today for hospital follow-up please see notes above.  Patient does have her medications that she was discharged home from the hospital with and does have refills.  She is compliant with medications.  She does check her blood sugars at home.  A1c is stable in the office today at 7.9.  Patient states that she has been having ongoing shortness of breath since discharge from the hospital.  She has been needing to take extra Lasix as discussed at hospital discharge.  She has not followed with the heart failure clinic or cardiology.  We will place referral today.  Patient does have a past history of COPD and it was noted on chest x-ray that she did have pulmonary edema.  We will place order for pulmonary today as well.  Patient is compliant with inhalers. Denies f/c/s, n/v/d, hemoptysis, PND, leg swelling Denies chest pain or edema     No Known Allergies  Immunization History  Administered Date(s) Administered   Influenza Whole 06/21/2011   Influenza,inj,Quad PF,6+ Mos 09/06/2016, 08/17/2018, 08/25/2019, 06/29/2021, 11/15/2022   PFIZER Comirnaty(Gray Top)Covid-19 Tri-Sucrose Vaccine 01/15/2020, 02/09/2020   Pneumococcal Conjugate-13 08/25/2019   Pneumococcal Polysaccharide-23 12/06/2016   Pneumococcal-Unspecified 06/21/2011   Tdap 12/06/2016    Past Medical History:  Diagnosis Date   Bilateral lower extremity edema    Cough    Diabetes mellitus without complication (HCC)    Gallstones    Heavy cigarette smoker    Hemoglobin A1C between 7% and 9% indicating  borderline diabetic control ([place]) 08/2019   Hypercholesteremia    Hyperglycemia    Hypertension    Shortness of breath    Vitamin D deficiency 12/2020    Tobacco History: Social History   Tobacco Use  Smoking Status Every Day   Packs/day: 1.00   Years: 32.00    Total pack years: 32.00   Types: Cigarettes  Smokeless Tobacco Never  Tobacco Comments   Since age 69.   Ready to quit: Not Answered Counseling given: Not Answered Tobacco comments: Since age 12.   Outpatient Encounter Medications as of 12/18/2022  Medication Sig   albuterol (PROVENTIL) (2.5 MG/3ML) 0.083% nebulizer solution Take 3 mLs (2.5 mg total) by nebulization every 4 (four) hours as needed for wheezing or shortness of breath.   albuterol (VENTOLIN HFA) 108 (90 Base) MCG/ACT inhaler Inhale 2 puffs into the lungs every 6 (six) hours as needed for wheezing or shortness of breath.   amLODipine (NORVASC) 10 MG tablet Take 1 tablet (10 mg total) by mouth daily.   aspirin EC 81 MG tablet Take 1 tablet (81 mg total) by mouth daily with breakfast. Swallow whole.   atorvastatin (LIPITOR) 10 MG tablet TAKE 1 TABLET (10 MG TOTAL) BY MOUTH DAILY.   blood glucose meter kit and supplies KIT Dispense based on patient and insurance preference. Use up to four times daily as directed.   fluticasone-salmeterol (ADVAIR) 100-50 MCG/ACT AEPB Inhale 1 puff into the lungs 2 (two) times daily.   furosemide (LASIX) 20 MG tablet Take 1 tablet (20 mg total) by mouth daily. May take Additional tablet ( 20 mg) if you gain more than 3 pounds in 1 day or more than 5 pounds in 1 week   insulin glargine (LANTUS) 100 UNIT/ML Solostar Pen Inject 31 Units into the skin at bedtime.   Lancets (ONETOUCH ULTRASOFT) lancets Use as instructed   lisinopril (ZESTRIL) 40 MG tablet Take 1 tablet (40 mg total) by mouth daily.   metFORMIN (GLUCOPHAGE) 1000 MG tablet Take 1 tablet (1,000 mg total) by mouth 2 (two) times daily with a meal.   Nebulizers (COMPRESSOR/NEBULIZER) MISC 1 Units by Does not apply route as needed.   omeprazole (PRILOSEC) 20 MG capsule Take 1 capsule (20 mg total) by mouth daily.   Tiotropium Bromide Monohydrate (SPIRIVA RESPIMAT) 2.5 MCG/ACT AERS Inhale 2 puffs into the lungs daily.   [DISCONTINUED]  predniSONE (DELTASONE) 20 MG tablet Take 1 tablet (20 mg total) by mouth daily with breakfast for 5 days.   ALPRAZolam (XANAX) 0.5 MG tablet Take 1 tablet (0.5 mg total) by mouth 2 (two) times daily as needed for anxiety. (Patient not taking: Reported on 12/18/2022)   dextromethorphan-guaiFENesin (MUCINEX DM) 30-600 MG 12hr tablet Take 1 tablet by mouth 2 (two) times daily. (Patient not taking: Reported on 12/18/2022)   Insulin Pen Needle (BD ULTRA-FINE PEN NEEDLES) 29G X 12.7MM MISC ICD10 E11.9 for use with insulin administration (Patient not taking: Reported on 11/30/2021)   Insulin Syringes, Disposable, U-100 0.3 ML MISC 1 each by Does not apply route at bedtime. (Patient not taking: Reported on 12/13/2021)   nicotine (NICODERM CQ - DOSED IN MG/24 HOURS) 14 mg/24hr patch Place 1 patch (14 mg total) onto the skin daily. (Patient not taking: Reported on 12/18/2022)   predniSONE (DELTASONE) 20 MG tablet Take 1 tablet (20 mg total) by mouth daily with breakfast for 5 days.   thiamine (VITAMIN B1) 100 MG tablet Take 1 tablet (100 mg total) by mouth daily. (Patient not  taking: Reported on 12/18/2022)   No facility-administered encounter medications on file as of 12/18/2022.     Review of Systems  Review of Systems  Constitutional:  Positive for activity change and fatigue.  HENT: Negative.    Respiratory:  Positive for shortness of breath.   Cardiovascular: Negative.   Gastrointestinal: Negative.   Allergic/Immunologic: Negative.   Neurological: Negative.   Psychiatric/Behavioral: Negative.         Physical Exam  BP (!) 137/52   Pulse 93   Temp 98 F (36.7 C)   Wt 165 lb 3.2 oz (74.9 kg)   SpO2 99%   BMI 28.36 kg/m   Wt Readings from Last 5 Encounters:  12/18/22 165 lb 3.2 oz (74.9 kg)  11/16/22 155 lb 1.6 oz (70.4 kg)  02/27/22 147 lb (66.7 kg)  12/13/21 148 lb 3.2 oz (67.2 kg)  11/30/21 155 lb 0.8 oz (70.3 kg)     Physical Exam Vitals and nursing note reviewed.   Constitutional:      General: She is not in acute distress.    Appearance: She is well-developed.  Cardiovascular:     Rate and Rhythm: Normal rate and regular rhythm.  Pulmonary:     Effort: Pulmonary effort is normal.     Breath sounds: Normal breath sounds.  Neurological:     Mental Status: She is alert and oriented to person, place, and time.       Assessment & Plan:   Type 2 diabetes mellitus without complication, without long-term current use of insulin (HCC) - Microalbumin/Creatinine Ratio, Urine - POCT glycosylated hemoglobin (Hb A1C) - Cologuard - Ambulatory referral to Ophthalmology - Ambulatory referral to Podiatry - AMB Referral to Pharmacy Medication Management - predniSONE (DELTASONE) 20 MG tablet; Take 1 tablet (20 mg total) by mouth daily with breakfast for 5 days.  Dispense: 5 tablet; Refill: 0   2. Colon cancer screening  - Cologuard   3. Encounter for screening mammogram for malignant neoplasm of breast  - MM Digital Screening; Future   4. Acute on chronic diastolic CHF /GD1 -EF 55 to 60%  - Ambulatory referral to Cardiology  5. Chronic pulmonary edema  - Ambulatory referral to Pulmonology  6. Tobacco dependence  - Ambulatory referral to Pulmonology  7. COPD with acute exacerbation ([place])  - Ambulatory referral to Pulmonology  Follow up:  Follow up in 3 months or sooner if needed     Fenton Foy, NP 12/18/2022

## 2022-12-18 NOTE — Patient Instructions (Addendum)
1. Type 2 diabetes mellitus without complication, without long-term current use of insulin (HCC)  - Microalbumin/Creatinine Ratio, Urine - POCT glycosylated hemoglobin (Hb A1C) - Cologuard - Ambulatory referral to Ophthalmology - Ambulatory referral to Podiatry - AMB Referral to Pharmacy Medication Management - predniSONE (DELTASONE) 20 MG tablet; Take 1 tablet (20 mg total) by mouth daily with breakfast for 5 days.  Dispense: 5 tablet; Refill: 0   2. Colon cancer screening  - Cologuard   3. Encounter for screening mammogram for malignant neoplasm of breast  - MM Digital Screening; Future   4. Acute on chronic diastolic CHF /GD1 -EF 55 to 60%  - Ambulatory referral to Cardiology  5. Chronic pulmonary edema  - Ambulatory referral to Pulmonology  6. Tobacco dependence  - Ambulatory referral to Pulmonology  7. COPD with acute exacerbation ([place])  - Ambulatory referral to Pulmonology  Follow up:  Follow up in 3 months or sooner if needed

## 2022-12-18 NOTE — Assessment & Plan Note (Signed)
-   Microalbumin/Creatinine Ratio, Urine - POCT glycosylated hemoglobin (Hb A1C) - Cologuard - Ambulatory referral to Ophthalmology - Ambulatory referral to Podiatry - AMB Referral to Pharmacy Medication Management - predniSONE (DELTASONE) 20 MG tablet; Take 1 tablet (20 mg total) by mouth daily with breakfast for 5 days.  Dispense: 5 tablet; Refill: 0   2. Colon cancer screening  - Cologuard   3. Encounter for screening mammogram for malignant neoplasm of breast  - MM Digital Screening; Future   4. Acute on chronic diastolic CHF /GD1 -EF 55 to 60%  - Ambulatory referral to Cardiology  5. Chronic pulmonary edema  - Ambulatory referral to Pulmonology  6. Tobacco dependence  - Ambulatory referral to Pulmonology  7. COPD with acute exacerbation ([place])  - Ambulatory referral to Pulmonology  Follow up:  Follow up in 3 months or sooner if needed

## 2022-12-19 ENCOUNTER — Emergency Department (HOSPITAL_COMMUNITY): Payer: Medicaid Other

## 2022-12-19 ENCOUNTER — Other Ambulatory Visit: Payer: Self-pay

## 2022-12-19 ENCOUNTER — Encounter (HOSPITAL_COMMUNITY): Payer: Self-pay

## 2022-12-19 ENCOUNTER — Inpatient Hospital Stay (HOSPITAL_COMMUNITY)
Admission: EM | Admit: 2022-12-19 | Discharge: 2022-12-21 | DRG: 871 | Disposition: A | Discharge status: Home / Self Care | Payer: Medicaid Other | Attending: Family Medicine | Admitting: Family Medicine

## 2022-12-19 DIAGNOSIS — Z794 Long term (current) use of insulin: Secondary | ICD-10-CM

## 2022-12-19 DIAGNOSIS — Z8673 Personal history of transient ischemic attack (TIA), and cerebral infarction without residual deficits: Secondary | ICD-10-CM

## 2022-12-19 DIAGNOSIS — I10 Essential (primary) hypertension: Secondary | ICD-10-CM | POA: Diagnosis not present

## 2022-12-19 DIAGNOSIS — I110 Hypertensive heart disease with heart failure: Secondary | ICD-10-CM | POA: Diagnosis present

## 2022-12-19 DIAGNOSIS — Z7984 Long term (current) use of oral hypoglycemic drugs: Secondary | ICD-10-CM | POA: Diagnosis not present

## 2022-12-19 DIAGNOSIS — N179 Acute kidney failure, unspecified: Secondary | ICD-10-CM | POA: Diagnosis not present

## 2022-12-19 DIAGNOSIS — I5032 Chronic diastolic (congestive) heart failure: Secondary | ICD-10-CM | POA: Diagnosis present

## 2022-12-19 DIAGNOSIS — J189 Pneumonia, unspecified organism: Secondary | ICD-10-CM | POA: Diagnosis not present

## 2022-12-19 DIAGNOSIS — Z79899 Other long term (current) drug therapy: Secondary | ICD-10-CM | POA: Diagnosis not present

## 2022-12-19 DIAGNOSIS — J441 Chronic obstructive pulmonary disease with (acute) exacerbation: Secondary | ICD-10-CM | POA: Diagnosis present

## 2022-12-19 DIAGNOSIS — Z7951 Long term (current) use of inhaled steroids: Secondary | ICD-10-CM

## 2022-12-19 DIAGNOSIS — D72819 Decreased white blood cell count, unspecified: Secondary | ICD-10-CM | POA: Diagnosis present

## 2022-12-19 DIAGNOSIS — E119 Type 2 diabetes mellitus without complications: Secondary | ICD-10-CM

## 2022-12-19 DIAGNOSIS — E118 Type 2 diabetes mellitus with unspecified complications: Secondary | ICD-10-CM | POA: Diagnosis not present

## 2022-12-19 DIAGNOSIS — E7800 Pure hypercholesterolemia, unspecified: Secondary | ICD-10-CM | POA: Diagnosis present

## 2022-12-19 DIAGNOSIS — Z1152 Encounter for screening for COVID-19: Secondary | ICD-10-CM

## 2022-12-19 DIAGNOSIS — E785 Hyperlipidemia, unspecified: Secondary | ICD-10-CM | POA: Diagnosis present

## 2022-12-19 DIAGNOSIS — J440 Chronic obstructive pulmonary disease with acute lower respiratory infection: Secondary | ICD-10-CM | POA: Diagnosis present

## 2022-12-19 DIAGNOSIS — K219 Gastro-esophageal reflux disease without esophagitis: Secondary | ICD-10-CM | POA: Diagnosis present

## 2022-12-19 DIAGNOSIS — F17210 Nicotine dependence, cigarettes, uncomplicated: Secondary | ICD-10-CM | POA: Diagnosis present

## 2022-12-19 DIAGNOSIS — J9601 Acute respiratory failure with hypoxia: Secondary | ICD-10-CM | POA: Diagnosis present

## 2022-12-19 DIAGNOSIS — J90 Pleural effusion, not elsewhere classified: Secondary | ICD-10-CM | POA: Diagnosis not present

## 2022-12-19 DIAGNOSIS — E669 Obesity, unspecified: Secondary | ICD-10-CM | POA: Diagnosis present

## 2022-12-19 DIAGNOSIS — R0602 Shortness of breath: Secondary | ICD-10-CM | POA: Diagnosis not present

## 2022-12-19 DIAGNOSIS — A419 Sepsis, unspecified organism: Principal | ICD-10-CM | POA: Diagnosis present

## 2022-12-19 DIAGNOSIS — G8929 Other chronic pain: Secondary | ICD-10-CM | POA: Diagnosis present

## 2022-12-19 DIAGNOSIS — R6520 Severe sepsis without septic shock: Secondary | ICD-10-CM | POA: Diagnosis not present

## 2022-12-19 DIAGNOSIS — Z7982 Long term (current) use of aspirin: Secondary | ICD-10-CM

## 2022-12-19 DIAGNOSIS — M79606 Pain in leg, unspecified: Secondary | ICD-10-CM | POA: Diagnosis present

## 2022-12-19 DIAGNOSIS — Z8249 Family history of ischemic heart disease and other diseases of the circulatory system: Secondary | ICD-10-CM | POA: Diagnosis not present

## 2022-12-19 DIAGNOSIS — Z720 Tobacco use: Secondary | ICD-10-CM | POA: Diagnosis present

## 2022-12-19 DIAGNOSIS — Z7952 Long term (current) use of systemic steroids: Secondary | ICD-10-CM | POA: Diagnosis not present

## 2022-12-19 DIAGNOSIS — Z6828 Body mass index (BMI) 28.0-28.9, adult: Secondary | ICD-10-CM

## 2022-12-19 DIAGNOSIS — Z833 Family history of diabetes mellitus: Secondary | ICD-10-CM

## 2022-12-19 LAB — CBC
HCT: 26.4 % — ABNORMAL LOW (ref 36.0–46.0)
Hematocrit: 24.8 % — ABNORMAL LOW (ref 34.0–46.6)
Hemoglobin: 7.9 g/dL — ABNORMAL LOW (ref 11.1–15.9)
Hemoglobin: 8.1 g/dL — ABNORMAL LOW (ref 12.0–15.0)
MCH: 25 pg — ABNORMAL LOW (ref 26.6–33.0)
MCH: 25.3 pg — ABNORMAL LOW (ref 26.0–34.0)
MCHC: 31.9 g/dL (ref 31.5–35.7)
MCHC: 30.7 g/dL (ref 30.0–36.0)
MCV: 79 fL (ref 79–97)
MCV: 82.5 fL (ref 80.0–100.0)
Platelets: 311 10*3/uL (ref 150–450)
Platelets: 292 10*3/uL (ref 150–400)
RBC: 3.16 x10E6/uL — ABNORMAL LOW (ref 3.77–5.28)
RBC: 3.2 MIL/uL — ABNORMAL LOW (ref 3.87–5.11)
RDW: 16.3 % — ABNORMAL HIGH (ref 11.7–15.4)
RDW: 17.8 % — ABNORMAL HIGH (ref 11.5–15.5)
WBC: 3.9 10*3/uL (ref 3.4–10.8)
WBC: 3.9 10*3/uL — ABNORMAL LOW (ref 4.0–10.5)
nRBC: 0 % (ref 0.0–0.2)

## 2022-12-19 LAB — COMPREHENSIVE METABOLIC PANEL
ALT: 8 IU/L (ref 0–32)
AST: 12 IU/L (ref 0–40)
Albumin/Globulin Ratio: 1.4 (ref 1.2–2.2)
Albumin: 3.6 g/dL — ABNORMAL LOW (ref 3.8–4.9)
Alkaline Phosphatase: 96 IU/L (ref 44–121)
BUN/Creatinine Ratio: 13 (ref 9–23)
BUN: 12 mg/dL (ref 6–24)
Bilirubin Total: <0.2 mg/dL (ref 0.0–1.2)
CO2: 22 mmol/L (ref 20–29)
Calcium: 9 mg/dL (ref 8.7–10.2)
Chloride: 107 mmol/L — ABNORMAL HIGH (ref 96–106)
Creatinine, Ser: 0.9 mg/dL (ref 0.57–1.00)
Globulin, Total: 2.6 g/dL (ref 1.5–4.5)
Glucose: 118 mg/dL — ABNORMAL HIGH (ref 70–99)
Potassium: 3.9 mmol/L (ref 3.5–5.2)
Sodium: 142 mmol/L (ref 134–144)
Total Protein: 6.2 g/dL (ref 6.0–8.5)
eGFR: 76 mL/min/{1.73_m2} (ref >59)

## 2022-12-19 LAB — RESP PANEL BY RT-PCR (RSV, FLU A&B, COVID)  RVPGX2
Influenza A by PCR: NEGATIVE
Influenza B by PCR: NEGATIVE
Resp Syncytial Virus by PCR: NEGATIVE
SARS Coronavirus 2 by RT PCR: NEGATIVE

## 2022-12-19 LAB — BASIC METABOLIC PANEL
Anion gap: 14 (ref 5–15)
BUN: 10 mg/dL (ref 6–20)
CO2: 22 mmol/L (ref 22–32)
Calcium: 8.5 mg/dL — ABNORMAL LOW (ref 8.9–10.3)
Chloride: 103 mmol/L (ref 98–111)
Creatinine, Ser: 1.2 mg/dL — ABNORMAL HIGH (ref 0.44–1.00)
GFR, Estimated: 54 mL/min — ABNORMAL LOW (ref >60)
Glucose, Bld: 127 mg/dL — ABNORMAL HIGH (ref 70–99)
Potassium: 4 mmol/L (ref 3.5–5.1)
Sodium: 139 mmol/L (ref 135–145)

## 2022-12-19 LAB — GLUCOSE, CAPILLARY: Glucose-Capillary: 139 mg/dL — ABNORMAL HIGH (ref 70–99)

## 2022-12-19 LAB — BRAIN NATRIURETIC PEPTIDE: B Natriuretic Peptide: 301 pg/mL — ABNORMAL HIGH (ref 0.0–100.0)

## 2022-12-19 LAB — LIPID PANEL
Chol/HDL Ratio: 4.1 ratio (ref 0.0–4.4)
Cholesterol, Total: 234 mg/dL — ABNORMAL HIGH (ref 100–199)
HDL: 57 mg/dL (ref >39)
LDL Chol Calc (NIH): 167 mg/dL — ABNORMAL HIGH (ref 0–99)
Triglycerides: 62 mg/dL (ref 0–149)
VLDL Cholesterol Cal: 10 mg/dL (ref 5–40)

## 2022-12-19 MED ORDER — DM-GUAIFENESIN ER 30-600 MG PO TB12: 1.0000 | ORAL_TABLET | Freq: Two times a day (BID) | ORAL | Status: DC | PRN

## 2022-12-19 MED ORDER — HYDRALAZINE HCL 20 MG/ML IJ SOLN: 10.0000 mg | Freq: Four times a day (QID) | INTRAMUSCULAR | Status: DC | PRN

## 2022-12-19 MED ORDER — ASPIRIN 81 MG PO TBEC
81.0000 mg | DELAYED_RELEASE_TABLET | Freq: Every day | ORAL | Status: DC
  Administered 2022-12-20 – 2022-12-21 (×2): 81 mg via ORAL
  Filled 2022-12-19: qty 1

## 2022-12-19 MED ORDER — ONDANSETRON HCL 4 MG PO TABS: 4.0000 mg | ORAL_TABLET | Freq: Four times a day (QID) | ORAL | Status: DC | PRN

## 2022-12-19 MED ORDER — IOHEXOL 350 MG/ML SOLN
100.0000 mL | Freq: Once | INTRAVENOUS | Status: AC | PRN
  Administered 2022-12-19: 75 mL via INTRAVENOUS

## 2022-12-19 MED ORDER — ACETAMINOPHEN 650 MG RE SUPP: 650.0000 mg | Freq: Four times a day (QID) | RECTAL | Status: DC | PRN

## 2022-12-19 MED ORDER — ALBUTEROL SULFATE (2.5 MG/3ML) 0.083% IN NEBU: 2.5000 mg | INHALATION_SOLUTION | RESPIRATORY_TRACT | Status: DC | PRN

## 2022-12-19 MED ORDER — IPRATROPIUM-ALBUTEROL 0.5-2.5 (3) MG/3ML IN SOLN
RESPIRATORY_TRACT | Status: AC
  Administered 2022-12-19: 3 mL
  Filled 2022-12-19: qty 3

## 2022-12-19 MED ORDER — INSULIN ASPART 100 UNIT/ML IJ SOLN: 0.0000 [IU] | Freq: Every day | INTRAMUSCULAR | Status: DC

## 2022-12-19 MED ORDER — SODIUM CHLORIDE 0.9 % IV SOLN
2.0000 g | Freq: Once | INTRAVENOUS | Status: AC
  Administered 2022-12-19: 2 g via INTRAVENOUS
  Filled 2022-12-19: qty 12.5

## 2022-12-19 MED ORDER — ATORVASTATIN CALCIUM 10 MG PO TABS
10.0000 mg | ORAL_TABLET | Freq: Every day | ORAL | Status: DC
  Administered 2022-12-20 – 2022-12-21 (×2): 10 mg via ORAL
  Filled 2022-12-19 (×2): qty 1

## 2022-12-19 MED ORDER — NICOTINE 14 MG/24HR TD PT24
14.0000 mg | MEDICATED_PATCH | Freq: Every day | TRANSDERMAL | Status: DC
  Administered 2022-12-20 – 2022-12-21 (×2): 14 mg via TRANSDERMAL
  Filled 2022-12-19 (×2): qty 1

## 2022-12-19 MED ORDER — SODIUM CHLORIDE 0.9 % IV SOLN
2.0000 g | INTRAVENOUS | Status: DC
  Administered 2022-12-20 – 2022-12-21 (×2): 2 g via INTRAVENOUS
  Filled 2022-12-19 (×2): qty 20

## 2022-12-19 MED ORDER — TIOTROPIUM BROMIDE MONOHYDRATE 2.5 MCG/ACT IN AERS: 2.0000 | INHALATION_SPRAY | Freq: Every day | RESPIRATORY_TRACT | Status: DC

## 2022-12-19 MED ORDER — INSULIN ASPART 100 UNIT/ML IJ SOLN
0.0000 [IU] | Freq: Three times a day (TID) | INTRAMUSCULAR | Status: DC
  Administered 2022-12-20: 8 [IU] via SUBCUTANEOUS
  Administered 2022-12-20: 3 [IU] via SUBCUTANEOUS
  Administered 2022-12-20: 2 [IU] via SUBCUTANEOUS
  Administered 2022-12-21: 3 [IU] via SUBCUTANEOUS

## 2022-12-19 MED ORDER — METHYLPREDNISOLONE SODIUM SUCC 125 MG IJ SOLR
125.0000 mg | Freq: Every day | INTRAMUSCULAR | Status: AC
  Administered 2022-12-20: 125 mg via INTRAVENOUS
  Filled 2022-12-19: qty 2

## 2022-12-19 MED ORDER — THIAMINE MONONITRATE 100 MG PO TABS
100.0000 mg | ORAL_TABLET | Freq: Every day | ORAL | Status: DC
  Administered 2022-12-20 – 2022-12-21 (×2): 100 mg via ORAL
  Filled 2022-12-19 (×6): qty 1

## 2022-12-19 MED ORDER — ONDANSETRON HCL 4 MG/2ML IJ SOLN
4.0000 mg | Freq: Four times a day (QID) | INTRAMUSCULAR | Status: DC | PRN
  Administered 2022-12-20 – 2022-12-21 (×2): 4 mg via INTRAVENOUS
  Filled 2022-12-19 (×2): qty 2

## 2022-12-19 MED ORDER — UMECLIDINIUM BROMIDE 62.5 MCG/ACT IN AEPB
1.0000 | INHALATION_SPRAY | Freq: Every day | RESPIRATORY_TRACT | Status: DC
  Administered 2022-12-20 – 2022-12-21 (×2): 1 via RESPIRATORY_TRACT
  Filled 2022-12-19: qty 7

## 2022-12-19 MED ORDER — VANCOMYCIN HCL 1250 MG/250ML IV SOLN: 1250.0000 mg | INTRAVENOUS | Status: DC

## 2022-12-19 MED ORDER — OXYCODONE HCL 5 MG PO TABS: 5.0000 mg | ORAL_TABLET | ORAL | Status: DC | PRN

## 2022-12-19 MED ORDER — SODIUM CHLORIDE 0.9 % IV SOLN: 1.0000 g | Freq: Once | INTRAVENOUS | Status: DC

## 2022-12-19 MED ORDER — SODIUM CHLORIDE 0.9 % IV SOLN
500.0000 mg | INTRAVENOUS | Status: DC
  Administered 2022-12-20 – 2022-12-21 (×2): 500 mg via INTRAVENOUS
  Filled 2022-12-19 (×2): qty 5

## 2022-12-19 MED ORDER — ALBUTEROL SULFATE (2.5 MG/3ML) 0.083% IN NEBU
INHALATION_SOLUTION | RESPIRATORY_TRACT | Status: AC
  Administered 2022-12-19: 2.5 mg
  Filled 2022-12-19: qty 3

## 2022-12-19 MED ORDER — SODIUM CHLORIDE 0.9 % IV BOLUS
500.0000 mL | Freq: Once | INTRAVENOUS | Status: AC
  Administered 2022-12-19: 500 mL via INTRAVENOUS

## 2022-12-19 MED ORDER — PREDNISONE 20 MG PO TABS
40.0000 mg | ORAL_TABLET | Freq: Every day | ORAL | Status: DC
  Administered 2022-12-21: 40 mg via ORAL
  Filled 2022-12-19: qty 2

## 2022-12-19 MED ORDER — HEPARIN SODIUM (PORCINE) 5000 UNIT/ML IJ SOLN
5000.0000 [IU] | Freq: Three times a day (TID) | INTRAMUSCULAR | Status: DC
  Administered 2022-12-20 – 2022-12-21 (×4): 5000 [IU] via SUBCUTANEOUS
  Filled 2022-12-19 (×3): qty 1

## 2022-12-19 MED ORDER — VANCOMYCIN HCL 1500 MG/300ML IV SOLN
1500.0000 mg | Freq: Once | INTRAVENOUS | Status: AC
  Administered 2022-12-19: 1500 mg via INTRAVENOUS
  Filled 2022-12-19: qty 300

## 2022-12-19 MED ORDER — IPRATROPIUM-ALBUTEROL 0.5-2.5 (3) MG/3ML IN SOLN
3.0000 mL | Freq: Four times a day (QID) | RESPIRATORY_TRACT | Status: DC
  Administered 2022-12-20 – 2022-12-21 (×5): 3 mL via RESPIRATORY_TRACT
  Filled 2022-12-19 (×6): qty 3

## 2022-12-19 MED ORDER — FUROSEMIDE 10 MG/ML IJ SOLN
20.0000 mg | Freq: Once | INTRAMUSCULAR | Status: AC
  Administered 2022-12-19: 20 mg via INTRAVENOUS
  Filled 2022-12-19: qty 2

## 2022-12-19 MED ORDER — MOMETASONE FURO-FORMOTEROL FUM 100-5 MCG/ACT IN AERO
2.0000 | INHALATION_SPRAY | Freq: Two times a day (BID) | RESPIRATORY_TRACT | Status: DC
  Administered 2022-12-20 – 2022-12-21 (×3): 2 via RESPIRATORY_TRACT
  Filled 2022-12-19: qty 8.8

## 2022-12-19 MED ORDER — ACETAMINOPHEN 325 MG PO TABS: 650.0000 mg | ORAL_TABLET | Freq: Four times a day (QID) | ORAL | Status: DC | PRN

## 2022-12-19 NOTE — ED Notes (Signed)
ED TO INPATIENT HANDOFF REPORT  ED Nurse Name and Phone #: Benjamine Mola [phone]  S Name/Age/Gender Misty Mccullough 52 y.o. female Room/Bed: APA04/APA04  Code Status   Code Status: Full Code  Home/SNF/Other Home Patient oriented to: self, place, time, and situation Is this baseline? Yes   Triage Complete: Triage complete  Chief Complaint Acute respiratory failure with hypoxia ([place]) [J96.01]  Triage Note Pt has been having problems with shortness of breath for the last month, getting worse each day. Went to her physician yesterday and had labs drawn and she called her  this morning and said her hemoglobin was 7. The physician wanted her to be seen and evaluated for it.   Allergies No Known Allergies  Level of Care/Admitting Diagnosis ED Disposition     ED Disposition  Admit   Condition  --   [place]: [facility] [id number]  Level of Care: Telemetry [5]  Covid Evaluation: Symptomatic Person Under Investigation (PUI) or recent exposure (last 10 days) *Testing Required*  Diagnosis: Acute respiratory failure with hypoxia [facility]) [id number]  Admitting Physician: Rolla Plate [id number]  Attending Physician: Rolla Plate [id number]  Certification:: I certify this patient will need inpatient services for at least 2 midnights  Estimated Length of Stay: 2          B Medical/Surgery History Past Medical History:  Diagnosis Date   Bilateral lower extremity edema    Cough    Diabetes mellitus without complication ([place])    Gallstones    Heavy cigarette smoker    Hemoglobin A1C between 7% and 9% indicating borderline diabetic control ([place]) 08/2019   Hypercholesteremia    Hyperglycemia    Hypertension    Shortness of breath    Vitamin D deficiency 12/2020   Past Surgical History:  Procedure Laterality Date   TUBAL LIGATION       A IV Location/Drains/Wounds Patient Lines/Drains/Airways Status     Active Line/Drains/Airways     Name  Placement date Placement time Site Days   Peripheral IV 12/19/22 20 G Anterior;Proximal;Right Forearm 12/19/22  1553  Forearm  less than 1            Intake/Output Last 24 hours  Intake/Output Summary (Last 24 hours) at 12/19/2022 2043 Last data filed at 12/19/2022 1958 Gross per 24 hour  Intake 500 ml  Output --  Net 500 ml    Labs/Imaging Results for orders placed or performed during the hospital encounter of 12/19/22 (from the past 48 hour(s))  Basic metabolic panel     Status: Abnormal   Collection Time: 12/19/22  2:10 PM  Result Value Ref Range   Sodium 139 135 - 145 mmol/L   Potassium 4.0 3.5 - 5.1 mmol/L   Chloride 103 98 - 111 mmol/L   CO2 22 22 - 32 mmol/L   Glucose, Bld 127 (H) 70 - 99 mg/dL    Comment: Glucose reference range applies only to samples taken after fasting for at least 8 hours.   BUN 10 6 - 20 mg/dL   Creatinine, Ser 1.20 (H) 0.44 - 1.00 mg/dL   Calcium 8.5 (L) 8.9 - 10.3 mg/dL   GFR, Estimated 54 (L) >60 mL/min    Comment: (NOTE) Calculated using the CKD-EPI Creatinine Equation (2021)    Anion gap 14 5 - 15    Comment: Performed at [facility], [address] [place], [place] [address]  CBC     Status: Abnormal   Collection Time: 12/19/22  2:10 PM  Result Value Ref Range   WBC 3.9 (L) 4.0 - 10.5 K/uL   RBC 3.20 (L) 3.87 - 5.11 MIL/uL   Hemoglobin 8.1 (L) 12.0 - 15.0 g/dL   HCT 26.4 (L) 36.0 - 46.0 %   MCV 82.5 80.0 - 100.0 fL   MCH 25.3 (L) 26.0 - 34.0 pg   MCHC 30.7 30.0 - 36.0 g/dL   RDW 17.8 (H) 11.5 - 15.5 %   Platelets 292 150 - 400 K/uL   nRBC 0.0 0.0 - 0.2 %    Comment: Performed at [facility], [address] [place], [place] [address]  Brain natriuretic peptide     Status: Abnormal   Collection Time: 12/19/22  2:10 PM  Result Value Ref Range   B Natriuretic Peptide 301.0 (H) 0.0 - 100.0 pg/mL    Comment: Performed at [facility], [address] [place], [place] [address]  Resp panel by RT-PCR (RSV, Flu A&B, Covid) Anterior  Nasal Swab     Status: None   Collection Time: 12/19/22  7:20 PM   Specimen: Anterior Nasal Swab  Result Value Ref Range   SARS Coronavirus 2 by RT PCR NEGATIVE NEGATIVE    Comment: (NOTE) SARS-CoV-2 target nucleic acids are NOT DETECTED.  The SARS-CoV-2 RNA is generally detectable in upper respiratory specimens during the acute phase of infection. The lowest concentration of SARS-CoV-2 viral copies this assay can detect is 138 copies/mL. A negative result does not preclude SARS-Cov-2 infection and should not be used as the sole basis for treatment or other patient management decisions. A negative result may occur with  improper specimen collection/handling, submission of specimen other than nasopharyngeal swab, presence of viral mutation(s) within the areas targeted by this assay, and inadequate number of viral copies(<138 copies/mL). A negative result must be combined with clinical observations, patient history, and epidemiological information. The expected result is Negative.  Fact Sheet for Patients:  EntrepreneurPulse.com.au  Fact Sheet for Healthcare Providers:  IncredibleEmployment.be  This test is no t yet approved or cleared by the [place] FDA and  has been authorized for detection and/or diagnosis of SARS-CoV-2 by FDA under an Emergency Use Authorization (EUA). This EUA will remain  in effect (meaning this test can be used) for the duration of the COVID-19 declaration under Section 564(b)(1) of the Act, 21 U.S.C.section 360bbb-3(b)(1), unless the authorization is terminated  or revoked sooner.       Influenza A by PCR NEGATIVE NEGATIVE   Influenza B by PCR NEGATIVE NEGATIVE    Comment: (NOTE) The Xpert Xpress SARS-CoV-2/FLU/RSV plus assay is intended as an aid in the diagnosis of influenza from Nasopharyngeal swab specimens and should not be used as a sole basis for treatment. Nasal washings and aspirates are unacceptable  for Xpert Xpress SARS-CoV-2/FLU/RSV testing.  Fact Sheet for Patients: EntrepreneurPulse.com.au  Fact Sheet for Healthcare Providers: IncredibleEmployment.be  This test is not yet approved or cleared by the [place] FDA and has been authorized for detection and/or diagnosis of SARS-CoV-2 by FDA under an Emergency Use Authorization (EUA). This EUA will remain in effect (meaning this test can be used) for the duration of the COVID-19 declaration under Section 564(b)(1) of the Act, 21 U.S.C. section 360bbb-3(b)(1), unless the authorization is terminated or revoked.     Resp Syncytial Virus by PCR NEGATIVE NEGATIVE    Comment: (NOTE) Fact Sheet for Patients: EntrepreneurPulse.com.au  Fact Sheet for Healthcare Providers: IncredibleEmployment.be  This test is not yet approved or cleared by  the [employer] and has been authorized for detection and/or diagnosis of SARS-CoV-2 by FDA under an Emergency Use Authorization (EUA). This EUA will remain in effect (meaning this test can be used) for the duration of the COVID-19 declaration under Section 564(b)(1) of the Act, 21 U.S.C. section 360bbb-3(b)(1), unless the authorization is terminated or revoked.  Performed at [facility], [address] [place], [place] [address]    CT Angio Chest PE W and/or Wo Contrast  Result Date: 12/19/2022 CLINICAL DATA:  High probability for PE.  Shortness of breath. EXAM: CT ANGIOGRAPHY CHEST WITH CONTRAST TECHNIQUE: Multidetector CT imaging of the chest was performed using the standard protocol during bolus administration of intravenous contrast. Multiplanar CT image reconstructions and MIPs were obtained to evaluate the vascular anatomy. RADIATION DOSE REDUCTION: This exam was performed according to the departmental dose-optimization program which includes automated exposure control, adjustment of the mA and/or kV according  to patient size and/or use of iterative reconstruction technique. CONTRAST:  47m OMNIPAQUE IOHEXOL 350 MG/ML SOLN COMPARISON:  Chest x-ray 12/19/2022. FINDINGS: Cardiovascular: Satisfactory opacification of the pulmonary arteries to the segmental level. No evidence of pulmonary embolism. Normal heart size. No pericardial effusion. Mediastinum/Nodes: There is an enlarged subcarinal lymph node measuring 13 mm. There are nonenlarged bilateral hilar and paratracheal as well as prevascular lymph nodes. The visualized thyroid gland and esophagus are within normal limits. Lungs/Pleura: There are small bilateral pleural effusions, right greater than left. There are multifocal patchy airspace opacities in the bilateral lower lobes, right middle lobe and lingula. These are predominantly seen peripherally. There is minimal scarring in the lung apices. No evidence for pneumothorax. There are minimal secretions in the trachea. Upper Abdomen: No acute abnormality. Musculoskeletal: No chest wall abnormality. No acute or significant osseous findings. Review of the MIP images confirms the above findings. IMPRESSION: 1. No evidence for pulmonary embolism. 2. Small bilateral pleural effusions, right greater than left. 3. Multifocal patchy airspace opacities in the bilateral lower lobes, right middle lobe and lingula worrisome for multifocal pneumonia. 4. Enlarged subcarinal lymph node, likely reactive. 5. Minimal secretions in the trachea. Electronically Signed   By: ARonney AstersM.D.   On: 12/19/2022 17:47   DG Chest 2 View  Result Date: 12/19/2022 CLINICAL DATA:  Shortness of breath EXAM: CHEST - 2 VIEW COMPARISON:  Chest x-ray dated November 14, 2018 FINDINGS: Cardiac and mediastinal contours are unchanged. Worsened patchy opacities of the left lung base with new nodular opacity of the right lower lobe, given interval worsening. Similar mild background diffuse interstitial opacities. New trace bilateral pleural effusions. No  evidence of pneumothorax. IMPRESSION: 1. Worsened patchy opacities of the left lung base with new nodular opacity of the right lower lobe, given interval worsening, chest CT is recommended for further evaluation. 2. Mild diffuse interstitial opacities, similar to prior and likely due to pulmonary edema. 3. New trace bilateral pleural effusions. Electronically Signed   By: LYetta GlassmanM.D.   On: 12/19/2022 15:57    Pending Labs [employer](From admission, onward)     Start     Ordered   Signed and Held  Basic metabolic panel  Once-Timed,   R        Signed and Held   Signed and Held  Expectorated Sputum Assessment w Gram Stain, Rflx to [employer] (COPD / Pneumonia / Cellulitis / Lower Extremity Wound)  Once,   R        Signed and Held   Signed  and Held  Legionella Pneumophila Serogp 1 Ur Ag  (COPD / Pneumonia / Cellulitis / Lower Extremity Wound)  Once,   R        Signed and Held   Signed and Held  Strep pneumoniae urinary antigen  (COPD / Pneumonia / Cellulitis / Lower Extremity Wound)  Once,   R        Signed and Held   Signed and Held  Blood gas, venous  Once,   R        Signed and Held   Signed and Held  Comprehensive metabolic panel  Tomorrow morning,   R        Signed and Held   Signed and Held  Magnesium  Tomorrow morning,   R        Signed and Held   Signed and Held  CBC with Differential/Platelet  Tomorrow morning,   R        Signed and Held            Vitals/Pain Today's Vitals   12/19/22 1930 12/19/22 1935 12/19/22 2000 12/19/22 2030  BP: (!) 165/64  (!) 156/108 (!) 157/66  Pulse: 95  100 95  Resp: (!) 25  19 (!) 25  Temp:    97.7 F (36.5 C)  TempSrc:    Oral  SpO2: (!) 88% 90% 91% 93%  Weight:      Height:      PainSc:        Isolation Precautions No active isolations  Medications Medications  vancomycin (VANCOREADY) IVPB 1500 mg/300 mL (has no administration in time range)  sodium chloride 0.9 % bolus 500 mL (0 mLs Intravenous Stopped 12/19/22  1958)  iohexol (OMNIPAQUE) 350 MG/ML injection 100 mL (75 mLs Intravenous Contrast Given 12/19/22 1720)  ipratropium-albuterol (DUONEB) 0.5-2.5 (3) MG/3ML nebulizer solution (3 mLs  Given 12/19/22 1936)  albuterol (PROVENTIL) (2.5 MG/3ML) 0.083% nebulizer solution (2.5 mg  Given 12/19/22 1936)  furosemide (LASIX) injection 20 mg (20 mg Intravenous Given 12/19/22 2006)  ceFEPIme (MAXIPIME) 2 g in sodium chloride 0.9 % 100 mL IVPB (2 g Intravenous New Bag/Given 12/19/22 2010)    Mobility walks with device     Focused Assessments Pulmonary Assessment Handoff:  Lung sounds: Bilateral Breath Sounds: Diminished, Expiratory wheezes, Fine crackles O2 Device: Nasal Cannula O2 Flow Rate (L/min): 4 L/min    R Recommendations: See Admitting Provider Note  Report given to:   Additional Notes:

## 2022-12-19 NOTE — ED Notes (Signed)
Put pt on 2L supplemental O2 for comfort

## 2022-12-19 NOTE — ED Notes (Signed)
Patient transported to CT 

## 2022-12-19 NOTE — ED Provider Triage Note (Signed)
Emergency Medicine Provider Triage Evaluation Note  Misty Mccullough , a 52 y.o. female  was evaluated in triage.  Pt complains of a low hemoglobin  Review of Systems  Positive: weakness Negative:   Physical Exam  BP (!) 150/68 (BP Location: Right Arm)   Pulse 88   Temp (!) 97.5 F (36.4 C) (Oral)   Resp 20   Ht '5\' 4"'$  (1.626 m)   Wt 74.8 kg   SpO2 95%   BMI 28.32 kg/m  Gen:   Awake, no distress    Resp:  Normal effort   MSK:   Moves extremities without difficulty   Other:     Medical Decision Making  Medically screening exam initiated at 1:43 PM.  Appropriate orders placed.  Misty Mccullough was informed that the remainder of the evaluation will be completed by another provider, this initial triage assessment does not replace that evaluation, and the importance of remaining in the ED until their evaluation is complete.     Misty Mccullough, [place] 12/19/22 1344

## 2022-12-19 NOTE — ED Notes (Signed)
Nasal cannula for hypoxia.

## 2022-12-19 NOTE — ED Provider Notes (Addendum)
[facility] Provider Note   CSN: [id number] Arrival date & time: 12/19/22  1223     History  Chief Complaint  Patient presents with   Shortness of Breath    Misty Mccullough is a 52 y.o. female.  HPI Patient presents at the behest of her primary care physician due to abnormal blood work.  Patient has multiple medical issues including prior stroke, possible prior heart failure, notes that she has baseline shortness of breath, but also smokes daily, 1 pack.  She saw her physician, had blood work drawn and after she was found to be anemic she was sent here for evaluation.  Patient denies any hematemesis or obvious rectal bleeding.  No pain, no true change in her dyspnea recently.  She continues to have pitting edema in her lower extremities.  She is on aspirin post stroke.    Home Medications Prior to Admission medications   Medication Sig Start Date End Date Taking? Authorizing Provider  albuterol (PROVENTIL) (2.5 MG/3ML) 0.083% nebulizer solution Take 3 mLs (2.5 mg total) by nebulization every 4 (four) hours as needed for wheezing or shortness of breath. 11/16/22 11/16/23 Yes Emokpae, Courage, MD  albuterol (VENTOLIN HFA) 108 (90 Base) MCG/ACT inhaler Inhale 2 puffs into the lungs every 6 (six) hours as needed for wheezing or shortness of breath. 12/13/21  Yes Margaretha Seeds, MD  aspirin EC 81 MG tablet Take 1 tablet (81 mg total) by mouth daily with breakfast. Swallow whole. 11/17/22  Yes Emokpae, Courage, MD  atorvastatin (LIPITOR) 10 MG tablet TAKE 1 TABLET (10 MG TOTAL) BY MOUTH DAILY. 01/28/22 01/28/23 Yes Passmore, Jake Church I, NP  fluticasone-salmeterol (ADVAIR) 100-50 MCG/ACT AEPB Inhale 1 puff into the lungs 2 (two) times daily. 11/16/22  Yes Emokpae, Courage, MD  furosemide (LASIX) 20 MG tablet Take 1 tablet (20 mg total) by mouth daily. May take Additional tablet ( 20 mg) if you gain more than 3 pounds in 1 day or more than 5 pounds in 1 week  11/16/22 11/16/23 Yes Emokpae, Courage, MD  insulin glargine (LANTUS) 100 UNIT/ML Solostar Pen Inject 31 Units into the skin at bedtime. 11/16/22 02/14/23 Yes Emokpae, Courage, MD  lisinopril (ZESTRIL) 40 MG tablet Take 1 tablet (40 mg total) by mouth daily. 11/16/22  Yes Roxan Hockey, MD  metFORMIN (GLUCOPHAGE) 1000 MG tablet Take 1 tablet (1,000 mg total) by mouth 2 (two) times daily with a meal. 11/16/22 11/16/23 Yes Emokpae, Courage, MD  omeprazole (PRILOSEC) 20 MG capsule Take 1 capsule (20 mg total) by mouth daily. 11/16/22 11/16/23 Yes Emokpae, Courage, MD  Tiotropium Bromide Monohydrate (SPIRIVA RESPIMAT) 2.5 MCG/ACT AERS Inhale 2 puffs into the lungs daily. 11/16/22  Yes Emokpae, Courage, MD  amLODipine (NORVASC) 10 MG tablet Take 1 tablet (10 mg total) by mouth daily. Patient not taking: Reported on 12/19/2022 11/16/22   Roxan Hockey, MD  blood glucose meter kit and supplies KIT Dispense based on patient and insurance preference. Use up to four times daily as directed. 03/08/22   Passmore, Jake Church I, NP  dextromethorphan-guaiFENesin (MUCINEX DM) 30-600 MG 12hr tablet Take 1 tablet by mouth 2 (two) times daily. Patient not taking: Reported on 12/18/2022 11/16/22   Roxan Hockey, MD  Insulin Pen Needle (BD ULTRA-FINE PEN NEEDLES) 29G X 12.7MM MISC ICD10 E11.9 for use with insulin administration Patient not taking: Reported on 11/30/2021 01/01/21   Azzie Glatter, FNP  Insulin Syringes, Disposable, U-100 0.3 ML MISC 1 each by Does not  apply route at bedtime. Patient not taking: Reported on 12/13/2021 01/30/18   Dorena Dew, FNP  Lancets [place] ULTRASOFT) lancets Use as instructed 03/08/22   Bo Merino I, NP  Nebulizers (COMPRESSOR/NEBULIZER) MISC 1 Units by Does not apply route as needed. 11/16/22   Roxan Hockey, MD  nicotine (NICODERM CQ - DOSED IN MG/24 HOURS) 14 mg/24hr patch Place 1 patch (14 mg total) onto the skin daily. Patient not taking: Reported on 12/18/2022 11/16/22   Roxan Hockey, MD  predniSONE (DELTASONE) 20 MG tablet Take 1 tablet (20 mg total) by mouth daily with breakfast for 5 days. 12/18/22 12/23/22  Fenton Foy, NP  thiamine (VITAMIN B1) 100 MG tablet Take 1 tablet (100 mg total) by mouth daily. Patient not taking: Reported on 12/18/2022 11/16/22   Roxan Hockey, MD      Allergies    Patient has no known allergies.    Review of Systems   Review of Systems  All other systems reviewed and are negative.   Physical Exam Updated Vital Signs BP (!) 167/58   Pulse 96   Temp 98.1 F (36.7 C) (Oral)   Resp (!) 24   Ht '5\' 4"'$  (1.626 m)   Wt 74.8 kg   SpO2 90%   BMI 28.32 kg/m  Physical Exam Vitals and nursing note reviewed.  Constitutional:      General: She is not in acute distress.    Appearance: She is well-developed.  HENT:     Head: Normocephalic and atraumatic.  Eyes:     Conjunctiva/sclera: Conjunctivae normal.  Cardiovascular:     Rate and Rhythm: Normal rate and regular rhythm.  Pulmonary:     Effort: Pulmonary effort is normal. No respiratory distress.     Breath sounds: Normal breath sounds. No stridor.  Abdominal:     General: There is no distension.  Skin:    General: Skin is warm and dry.  Neurological:     Mental Status: She is alert and oriented to person, place, and time.     Cranial Nerves: No cranial nerve deficit.  Psychiatric:        Mood and Affect: Mood normal.     ED Results / Procedures / Treatments   Labs (all labs ordered are listed, but only abnormal results are displayed) Labs Reviewed  BASIC METABOLIC PANEL - Abnormal; Notable for the following components:      Result Value   Glucose, Bld 127 (*)    Creatinine, Ser 1.20 (*)    Calcium 8.5 (*)    GFR, Estimated 54 (*)    All other components within normal limits  CBC - Abnormal; Notable for the following components:   WBC 3.9 (*)    RBC 3.20 (*)    Hemoglobin 8.1 (*)    HCT 26.4 (*)    MCH 25.3 (*)    RDW 17.8 (*)    All other  components within normal limits  BRAIN NATRIURETIC PEPTIDE - Abnormal; Notable for the following components:   B Natriuretic Peptide 301.0 (*)    All other components within normal limits  RESP PANEL BY RT-PCR (RSV, FLU A&B, COVID)  [id number]    EKG EKG Interpretation  Date/Time:  Thursday December 19 2022 14:41:05 EDT Ventricular Rate:  88 PR Interval:  156 QRS Duration: 98 QT Interval:  392 QTC Calculation: 475 R Axis:   61 Text Interpretation: Sinus rhythm ST-t wave abnormality No significant change since last tracing Abnormal ECG Confirmed by Vanita Panda,  Herbie [place] [phone]) on 12/19/2022 3:19:28 PM  Radiology CT Angio Chest PE W and/or Wo Contrast  Result Date: 12/19/2022 CLINICAL DATA:  High probability for PE.  Shortness of breath. EXAM: CT ANGIOGRAPHY CHEST WITH CONTRAST TECHNIQUE: Multidetector CT imaging of the chest was performed using the standard protocol during bolus administration of intravenous contrast. Multiplanar CT image reconstructions and MIPs were obtained to evaluate the vascular anatomy. RADIATION DOSE REDUCTION: This exam was performed according to the departmental dose-optimization program which includes automated exposure control, adjustment of the mA and/or kV according to patient size and/or use of iterative reconstruction technique. CONTRAST:  62m OMNIPAQUE IOHEXOL 350 MG/ML SOLN COMPARISON:  Chest x-ray 12/19/2022. FINDINGS: Cardiovascular: Satisfactory opacification of the pulmonary arteries to the segmental level. No evidence of pulmonary embolism. Normal heart size. No pericardial effusion. Mediastinum/Nodes: There is an enlarged subcarinal lymph node measuring 13 mm. There are nonenlarged bilateral hilar and paratracheal as well as prevascular lymph nodes. The visualized thyroid gland and esophagus are within normal limits. Lungs/Pleura: There are small bilateral pleural effusions, right greater than left. There are multifocal patchy airspace opacities in the bilateral  lower lobes, right middle lobe and lingula. These are predominantly seen peripherally. There is minimal scarring in the lung apices. No evidence for pneumothorax. There are minimal secretions in the trachea. Upper Abdomen: No acute abnormality. Musculoskeletal: No chest wall abnormality. No acute or significant osseous findings. Review of the MIP images confirms the above findings. IMPRESSION: 1. No evidence for pulmonary embolism. 2. Small bilateral pleural effusions, right greater than left. 3. Multifocal patchy airspace opacities in the bilateral lower lobes, right middle lobe and lingula worrisome for multifocal pneumonia. 4. Enlarged subcarinal lymph node, likely reactive. 5. Minimal secretions in the trachea. Electronically Signed   By: ARonney AstersM.D.   On: 12/19/2022 17:47   DG Chest 2 View  Result Date: 12/19/2022 CLINICAL DATA:  Shortness of breath EXAM: CHEST - 2 VIEW COMPARISON:  Chest x-ray dated November 14, 2018 FINDINGS: Cardiac and mediastinal contours are unchanged. Worsened patchy opacities of the left lung base with new nodular opacity of the right lower lobe, given interval worsening. Similar mild background diffuse interstitial opacities. New trace bilateral pleural effusions. No evidence of pneumothorax. IMPRESSION: 1. Worsened patchy opacities of the left lung base with new nodular opacity of the right lower lobe, given interval worsening, chest CT is recommended for further evaluation. 2. Mild diffuse interstitial opacities, similar to prior and likely due to pulmonary edema. 3. New trace bilateral pleural effusions. Electronically Signed   By: LYetta GlassmanM.D.   On: 12/19/2022 15:57    Procedures Procedures    Medications Ordered in ED Medications  ceFEPIme (MAXIPIME) 1 g in sodium chloride 0.9 % 100 mL IVPB (has no administration in time range)  furosemide (LASIX) injection 20 mg (has no administration in time range)  sodium chloride 0.9 % bolus 500 mL (500 mLs  Intravenous New Bag/Given 12/19/22 1646)  iohexol (OMNIPAQUE) 350 MG/ML injection 100 mL (75 mLs Intravenous Contrast Given 12/19/22 1720)  ipratropium-albuterol (DUONEB) 0.5-2.5 (3) MG/3ML nebulizer solution (3 mLs  Given 12/19/22 1936)  albuterol (PROVENTIL) (2.5 MG/3ML) 0.083% nebulizer solution (2.5 mg  Given 12/19/22 1936)    ED Course/ Medical Decision Making/ A&P                             Medical Decision Making Adult female with multiple medical problems including obesity, cigarette addiction, prior  stroke presents with dyspnea, outpatient labs that were abnormal.  Concern for symptomatic anemia versus heart failure exacerbation, less likely infection given the absence of fever, hypotension, distress.  Patient is hypoxic,82% on room air, improved to 89% with 2 L nasal cannula Cardiac 85 sinus normal .  X-ray labs monitoring all performed after evaluation.  Amount and/or Complexity of Data Reviewed External Data Reviewed: notes. Labs: ordered. Decision-making details documented in ED Course. Radiology: ordered and independent interpretation performed. Decision-making details documented in ED Course. ECG/medicine tests: ordered and independent interpretation performed. Decision-making details documented in ED Course.  Risk OTC drugs. Prescription drug management. Decision regarding hospitalization.   7:46 PM Awake and alert, notes that she feels feel somewhat better.  I reviewed her CT scan, x-ray, labs, findings concern for multifocal pneumonia in the context of COPD with new oxygen requirement.  Patient has improved, though she has a new oxygen requirement as above, and with concern for multifocal pneumonia, hypoxia, she will require admission. Patient started on broad-spectrum antibiotics, and with consideration of multifactorial etiology, given her history of heart failure, she also received IV Lasix.    CRITICAL CARE Performed by: Carmin Muskrat Total critical care  time: 35 minutes Critical care time was exclusive of separately billable procedures and treating other patients. Critical care was necessary to treat or prevent imminent or life-threatening deterioration. Critical care was time spent personally by me on the following activities: development of treatment plan with patient and/or surrogate as well as nursing, discussions with consultants, evaluation of patient's response to treatment, examination of patient, obtaining history from patient or surrogate, ordering and performing treatments and interventions, ordering and review of laboratory studies, ordering and review of radiographic studies, pulse oximetry and re-evaluation of patient's condition.      Final Clinical Impression(s) / ED Diagnoses Final diagnoses:  Multifocal pneumonia     Carmin Muskrat, MD 12/19/22 Marjo Bicker    Carmin Muskrat, MD 12/19/22 1948

## 2022-12-19 NOTE — Progress Notes (Signed)
Pharmacy Antibiotic Note  Misty Mccullough is a 52 y.o. female with COPD admitted on 12/19/2022 with pneumonia.  Pharmacy has been consulted for Vancomycin dosing. Cefepime 2gm IV per MD, given in ED x1. WBC low at 3.9. Afebrile. SCr 1.20 - up from baseline of 0.7-0.9. Cultures pending.   Plan: Vancomycin 1500 mg IV x1 then Vancomycin 1250 mg IV every 24 hours (eAUC 536, SCr 1.20) Continuing on Ceftriaxone and Azithromycin per MD.  Monitor renal function and adjust as needed.   Height: '5\' 4"'$  (162.6 cm) Weight: 74.8 kg (165 lb) IBW/kg (Calculated) : 54.7  Temp (24hrs), Avg:97.8 F (36.6 C), Min:97.5 F (36.4 C), Max:98.1 F (36.7 C)  Recent Labs  Lab 12/18/22 1424 12/19/22 1410  WBC 3.9 3.9*  CREATININE 0.90 1.20*    Estimated Creatinine Clearance: 53.7 mL/min (A) (by C-G formula based on SCr of 1.2 mg/dL (H)).    No Known Allergies  Antimicrobials this admission: Cefepime 3/14 x1 Vancomycin 3/14 >>  Dose adjustments this admission:   Microbiology results: Pending  Thank you for allowing pharmacy to be a part of this patient's care.  Sloan Leiter, PharmD, BCPS, BCCCP Clinical Pharmacist Please refer to [facility] for [place] numbers 12/19/2022 8:11 PM

## 2022-12-19 NOTE — ED Notes (Signed)
EDP aware of pt need for oxygen for hypoxia.

## 2022-12-19 NOTE — Progress Notes (Signed)
Called pt and inform message. [employer]

## 2022-12-19 NOTE — ED Notes (Signed)
Nurse called respiratory for pt.

## 2022-12-19 NOTE — ED Triage Notes (Signed)
Pt has been having problems with shortness of breath for the last month, getting worse each day. Went to her physician yesterday and had labs drawn and she called her  this morning and said her hemoglobin was 7. The physician wanted her to be seen and evaluated for it.

## 2022-12-20 DIAGNOSIS — N179 Acute kidney failure, unspecified: Secondary | ICD-10-CM | POA: Insufficient documentation

## 2022-12-20 DIAGNOSIS — J189 Pneumonia, unspecified organism: Secondary | ICD-10-CM | POA: Insufficient documentation

## 2022-12-20 DIAGNOSIS — E118 Type 2 diabetes mellitus with unspecified complications: Secondary | ICD-10-CM

## 2022-12-20 DIAGNOSIS — Z720 Tobacco use: Secondary | ICD-10-CM

## 2022-12-20 DIAGNOSIS — J9601 Acute respiratory failure with hypoxia: Secondary | ICD-10-CM

## 2022-12-20 DIAGNOSIS — R6520 Severe sepsis without septic shock: Secondary | ICD-10-CM

## 2022-12-20 DIAGNOSIS — A419 Sepsis, unspecified organism: Secondary | ICD-10-CM

## 2022-12-20 DIAGNOSIS — E785 Hyperlipidemia, unspecified: Secondary | ICD-10-CM

## 2022-12-20 DIAGNOSIS — J441 Chronic obstructive pulmonary disease with (acute) exacerbation: Secondary | ICD-10-CM | POA: Diagnosis not present

## 2022-12-20 DIAGNOSIS — I10 Essential (primary) hypertension: Secondary | ICD-10-CM

## 2022-12-20 LAB — CBC WITH DIFFERENTIAL/PLATELET
Abs Immature Granulocytes: 0.02 10*3/uL (ref 0.00–0.07)
Basophils Absolute: 0 10*3/uL (ref 0.0–0.1)
Basophils Relative: 1 %
Eosinophils Absolute: 0.1 10*3/uL (ref 0.0–0.5)
Eosinophils Relative: 2 %
HCT: 25.2 % — ABNORMAL LOW (ref 36.0–46.0)
Hemoglobin: 7.5 g/dL — ABNORMAL LOW (ref 12.0–15.0)
Immature Granulocytes: 1 %
Lymphocytes Relative: 21 %
Lymphs Abs: 0.8 10*3/uL (ref 0.7–4.0)
MCH: 24.5 pg — ABNORMAL LOW (ref 26.0–34.0)
MCHC: 29.8 g/dL — ABNORMAL LOW (ref 30.0–36.0)
MCV: 82.4 fL (ref 80.0–100.0)
Monocytes Absolute: 0.3 10*3/uL (ref 0.1–1.0)
Monocytes Relative: 8 %
Neutro Abs: 2.6 10*3/uL (ref 1.7–7.7)
Neutrophils Relative %: 67 %
Platelets: 264 10*3/uL (ref 150–400)
RBC: 3.06 MIL/uL — ABNORMAL LOW (ref 3.87–5.11)
RDW: 17.6 % — ABNORMAL HIGH (ref 11.5–15.5)
WBC: 3.8 10*3/uL — ABNORMAL LOW (ref 4.0–10.5)
nRBC: 0 % (ref 0.0–0.2)

## 2022-12-20 LAB — BASIC METABOLIC PANEL
Anion gap: 10 (ref 5–15)
BUN: 11 mg/dL (ref 6–20)
CO2: 23 mmol/L (ref 22–32)
Calcium: 8.4 mg/dL — ABNORMAL LOW (ref 8.9–10.3)
Chloride: 104 mmol/L (ref 98–111)
Creatinine, Ser: 0.89 mg/dL (ref 0.44–1.00)
GFR, Estimated: >60 mL/min (ref >60)
Glucose, Bld: 131 mg/dL — ABNORMAL HIGH (ref 70–99)
Potassium: 3.5 mmol/L (ref 3.5–5.1)
Sodium: 137 mmol/L (ref 135–145)

## 2022-12-20 LAB — COMPREHENSIVE METABOLIC PANEL
ALT: 9 U/L (ref 0–44)
AST: 13 U/L — ABNORMAL LOW (ref 15–41)
Albumin: 2.9 g/dL — ABNORMAL LOW (ref 3.5–5.0)
Alkaline Phosphatase: 71 U/L (ref 38–126)
Anion gap: 9 (ref 5–15)
BUN: 10 mg/dL (ref 6–20)
CO2: 24 mmol/L (ref 22–32)
Calcium: 8.4 mg/dL — ABNORMAL LOW (ref 8.9–10.3)
Chloride: 105 mmol/L (ref 98–111)
Creatinine, Ser: 0.83 mg/dL (ref 0.44–1.00)
GFR, Estimated: >60 mL/min (ref >60)
Glucose, Bld: 122 mg/dL — ABNORMAL HIGH (ref 70–99)
Potassium: 3.7 mmol/L (ref 3.5–5.1)
Sodium: 138 mmol/L (ref 135–145)
Total Bilirubin: 0.5 mg/dL (ref 0.3–1.2)
Total Protein: 6.5 g/dL (ref 6.5–8.1)

## 2022-12-20 LAB — GLUCOSE, CAPILLARY
Glucose-Capillary: 165 mg/dL — ABNORMAL HIGH (ref 70–99)
Glucose-Capillary: 182 mg/dL — ABNORMAL HIGH (ref 70–99)
Glucose-Capillary: 291 mg/dL — ABNORMAL HIGH (ref 70–99)
Glucose-Capillary: 421 mg/dL — ABNORMAL HIGH (ref 70–99)

## 2022-12-20 LAB — BLOOD GAS, VENOUS
Acid-Base Excess: 1 mmol/L (ref 0.0–2.0)
Bicarbonate: 25.2 mmol/L (ref 20.0–28.0)
Drawn by: 53316
O2 Saturation: 87.5 %
Patient temperature: 36.8
pCO2, Ven: 38 mmHg — ABNORMAL LOW (ref 44–60)
pH, Ven: 7.43 (ref 7.25–7.43)
pO2, Ven: 54 mmHg — ABNORMAL HIGH (ref 32–45)

## 2022-12-20 LAB — STREP PNEUMONIAE URINARY ANTIGEN: Strep Pneumo Urinary Antigen: NEGATIVE

## 2022-12-20 LAB — PROCALCITONIN: Procalcitonin: <0.1 ng/mL

## 2022-12-20 LAB — MAGNESIUM: Magnesium: 1.8 mg/dL (ref 1.7–2.4)

## 2022-12-20 LAB — MICROALBUMIN / CREATININE URINE RATIO
Creatinine, Urine: 14.3 mg/dL
Microalb/Creat Ratio: 3664 mg/g creat — ABNORMAL HIGH (ref 0–29)
Microalbumin, Urine: 524 ug/mL

## 2022-12-20 MED ORDER — AMLODIPINE BESYLATE 5 MG PO TABS: 10.0000 mg | ORAL_TABLET | Freq: Every day | ORAL | Status: DC

## 2022-12-20 MED ORDER — INSULIN GLARGINE-YFGN 100 UNIT/ML ~~LOC~~ SOLN
15.0000 [IU] | Freq: Every day | SUBCUTANEOUS | Status: DC
  Administered 2022-12-20: 15 [IU] via SUBCUTANEOUS
  Filled 2022-12-20 (×2): qty 0.15

## 2022-12-20 MED ORDER — FUROSEMIDE 10 MG/ML IJ SOLN
20.0000 mg | Freq: Every day | INTRAMUSCULAR | Status: DC
  Administered 2022-12-20 – 2022-12-21 (×2): 20 mg via INTRAVENOUS
  Filled 2022-12-20 (×2): qty 2

## 2022-12-20 MED ORDER — INSULIN ASPART 100 UNIT/ML IV SOLN
10.0000 [IU] | Freq: Once | INTRAVENOUS | Status: AC
  Administered 2022-12-20: 10 [IU] via INTRAVENOUS

## 2022-12-20 NOTE — Assessment & Plan Note (Signed)
-   Patient meets sepsis criteria, but appears clinically well and this pathology should resolve quickly - Leukopenia at 3.9, tachycardia at 97, tachypnea at 36, with acute respiratory failure with hypoxia and AKI - Seems to be related to multifocal pneumonia - Sputum culture pending, strep and Legionella antigens pending - Unfortunately blood cultures were not collected prior to broad-spectrum antibiotics in the ED - Cefepime and vancomycin started in the ED, given source is likely pneumonia will de-escalate to Rocephin and Zithromax - Procalcitonin with a.m. labs - Continue to monitor

## 2022-12-20 NOTE — Assessment & Plan Note (Signed)
-   Likely multifactorial - Chest x-ray and CTA chest possible multifocal pneumonia - Patient also has bilateral pleural effusions, AKI, orthopnea, dyspnea on exertion that would be consistent with CHF exacerbation - Patient also has COPD which is likely contributing as well - No PE on imaging - Cefepime and vancomycin started in the ED, will de-escalate treatment to Rocephin and Zithromax - Continue Lasix - Wean off O2 as tolerated - Albuterol as needed - DuoNeb scheduled - Continue steroid - Continue Dulera - Monitor on telemetry

## 2022-12-20 NOTE — Assessment & Plan Note (Addendum)
-   Continue Norvasc - Holding lisinopril in the setting of AKI - Likely 2 be able to resume lisinopril soon as creatinine is trending towards normal

## 2022-12-20 NOTE — Assessment & Plan Note (Signed)
-   Smokes a pack per day - Nicotine patch ordered

## 2022-12-20 NOTE — Assessment & Plan Note (Signed)
-   Hold metformin - Sliding scale coverage - Continue reduced dose of basal insulin - Continue to monitor

## 2022-12-20 NOTE — Assessment & Plan Note (Signed)
Continue statin. 

## 2022-12-20 NOTE — H&P (Signed)
History and Physical    Patient: Misty Mccullough [id number] DOB: [birth year] DOA: 12/19/2022 DOS: the patient was seen and examined on 12/20/2022 PCP: Fenton Foy, NP  Patient coming from: Home  Chief Complaint:  Chief Complaint  Patient presents with   Shortness of Breath   HPI: Misty Mccullough is a 52 y.o. female with medical history significant of diastolic CHF, COPD, hypertension, GERD, hyperlipidemia, diabetes mellitus type 2, and more presents the ED with a chief complaint of abnormal labs.  Patient reports she had gone to her PCP for routine follow-up and blood was drawn.  They called her and told her to come into the ER because her hemoglobin was 7.0.  It is 8.1.  This seems to be her baseline for this year.  Last year she did have hemoglobin of 10.8, but this year it has been strictly in the eights.  Patient denies melena, hematuria, bruising, hemoptysis, hematemesis, epistaxis.  Patient denies chest pain, lightheadedness, paresthesias.  Upon workup in the ER she was found to have multifocal pneumonia.  Patient reports that she had some shortness of breath about a month ago when she was in the hospital and found that she had CHF.  She reports that this was a new finding but that she has been on Lasix for a long time.  After she was discharged she felt better for 2 weeks, then 2 weeks ago she started feeling short of breath again.  Patient reports she has been using her rescue inhaler every 4 hours.  On good days she only has to use it every 8 hours.  Patient reports she has had a cough that been productive of clear sputum.  She has not had a fever.  She has had no sick contacts.  Patient denies any new myalgias, but reports she has chronic leg pain bilaterally.  Patient reports her dyspnea is worse on exertion.  She has had orthopnea ever since she was discharged from her last hospitalization, so that is not necessarily new.  Patient reports she has been taking her Lasix every day.  She was told  that she had weight gain to double her Lasix dose to 40 mg daily.  Patient reports that she has had about 15 pounds of weight gain, so she has been taking the 40 mg of p.o. Lasix at home.  Patient does not have an oxygen requirement at home, but is requiring 4 L nasal cannula in the hospital.  Patient has no other complaints at this time.  Patient does smoke a pack per day.  She does not drink alcohol, does not use illicit drugs.  She is vaccinated for COVID and flu.  Patient is full code.  There are no ACP documents. Review of Systems: As mentioned in the history of present illness. All other systems reviewed and are negative. Past Medical History:  Diagnosis Date   Bilateral lower extremity edema    Cough    Diabetes mellitus without complication ([place])    Gallstones    Heavy cigarette smoker    Hemoglobin A1C between 7% and 9% indicating borderline diabetic control ([place]) 08/2019   Hypercholesteremia    Hyperglycemia    Hypertension    Shortness of breath    Vitamin D deficiency 12/2020   Past Surgical History:  Procedure Laterality Date   TUBAL LIGATION     Social History:  reports that she has been smoking cigarettes. She has a 32.00 pack-year smoking history. She has never used smokeless tobacco. She  reports that she does not currently use alcohol. She reports that she does not use drugs.  No Known Allergies  Family History  Problem Relation Age of Onset   Diabetes Mother    Uterine cancer Mother    Diabetes Father    Heart attack Father 11   Stroke Father    Heart attack Paternal Grandmother    Heart attack Paternal Grandfather    Stroke Sister    Heart attack Paternal Uncle    Bone cancer Paternal Uncle     Prior to Admission medications   Medication Sig Start Date End Date Taking? Authorizing Provider  albuterol (PROVENTIL) (2.5 MG/3ML) 0.083% nebulizer solution Take 3 mLs (2.5 mg total) by nebulization every 4 (four) hours as needed for wheezing or shortness of  breath. 11/16/22 11/16/23 Yes Emokpae, Courage, MD  albuterol (VENTOLIN HFA) 108 (90 Base) MCG/ACT inhaler Inhale 2 puffs into the lungs every 6 (six) hours as needed for wheezing or shortness of breath. 12/13/21  Yes Margaretha Seeds, MD  aspirin EC 81 MG tablet Take 1 tablet (81 mg total) by mouth daily with breakfast. Swallow whole. 11/17/22  Yes Emokpae, Courage, MD  atorvastatin (LIPITOR) 10 MG tablet TAKE 1 TABLET (10 MG TOTAL) BY MOUTH DAILY. 01/28/22 01/28/23 Yes Passmore, Jake Church I, NP  fluticasone-salmeterol (ADVAIR) 100-50 MCG/ACT AEPB Inhale 1 puff into the lungs 2 (two) times daily. 11/16/22  Yes Emokpae, Courage, MD  furosemide (LASIX) 20 MG tablet Take 1 tablet (20 mg total) by mouth daily. May take Additional tablet ( 20 mg) if you gain more than 3 pounds in 1 day or more than 5 pounds in 1 week 11/16/22 11/16/23 Yes Emokpae, Courage, MD  insulin glargine (LANTUS) 100 UNIT/ML Solostar Pen Inject 31 Units into the skin at bedtime. 11/16/22 02/14/23 Yes Emokpae, Courage, MD  lisinopril (ZESTRIL) 40 MG tablet Take 1 tablet (40 mg total) by mouth daily. 11/16/22  Yes Roxan Hockey, MD  metFORMIN (GLUCOPHAGE) 1000 MG tablet Take 1 tablet (1,000 mg total) by mouth 2 (two) times daily with a meal. 11/16/22 11/16/23 Yes Emokpae, Courage, MD  omeprazole (PRILOSEC) 20 MG capsule Take 1 capsule (20 mg total) by mouth daily. 11/16/22 11/16/23 Yes Emokpae, Courage, MD  Tiotropium Bromide Monohydrate (SPIRIVA RESPIMAT) 2.5 MCG/ACT AERS Inhale 2 puffs into the lungs daily. 11/16/22  Yes Emokpae, Courage, MD  amLODipine (NORVASC) 10 MG tablet Take 1 tablet (10 mg total) by mouth daily. Patient not taking: Reported on 12/19/2022 11/16/22   Roxan Hockey, MD  blood glucose meter kit and supplies KIT Dispense based on patient and insurance preference. Use up to four times daily as directed. 03/08/22   Passmore, Jake Church I, NP  dextromethorphan-guaiFENesin (MUCINEX DM) 30-600 MG 12hr tablet Take 1 tablet by mouth 2 (two) times  daily. Patient not taking: Reported on 12/18/2022 11/16/22   Roxan Hockey, MD  Insulin Pen Needle (BD ULTRA-FINE PEN NEEDLES) 29G X 12.7MM MISC ICD10 E11.9 for use with insulin administration Patient not taking: Reported on 11/30/2021 01/01/21   Azzie Glatter, FNP  Insulin Syringes, Disposable, U-100 0.3 ML MISC 1 each by Does not apply route at bedtime. Patient not taking: Reported on 12/13/2021 01/30/18   Dorena Dew, FNP  Lancets [facility] ULTRASOFT) lancets Use as instructed 03/08/22   Bo Merino I, NP  Nebulizers (COMPRESSOR/NEBULIZER) MISC 1 Units by Does not apply route as needed. 11/16/22   Roxan Hockey, MD  nicotine (NICODERM CQ - DOSED IN MG/24 HOURS) 14 mg/24hr patch Place  1 patch (14 mg total) onto the skin daily. Patient not taking: Reported on 12/18/2022 11/16/22   Roxan Hockey, MD  predniSONE (DELTASONE) 20 MG tablet Take 1 tablet (20 mg total) by mouth daily with breakfast for 5 days. 12/18/22 12/23/22  Fenton Foy, NP  thiamine (VITAMIN B1) 100 MG tablet Take 1 tablet (100 mg total) by mouth daily. Patient not taking: Reported on 12/18/2022 11/16/22   Roxan Hockey, MD    Physical Exam: Vitals:   12/19/22 2230 12/19/22 2307 12/20/22 0148 12/20/22 0152  BP:  (!) 153/65 (!) 139/55   Pulse:  82 89   Resp:  18 18   Temp:  98.2 F (36.8 C) 98.1 F (36.7 C)   TempSrc:  Oral Oral   SpO2:  97% 93% 93%  Weight: 75.4 kg     Height: 5\' 4"  (1.626 m)      1.  General: Patient lying supine in bed,  no acute distress   2. Psychiatric: Alert and oriented x 3, mood and behavior normal for situation, pleasant and cooperative with exam   3. Neurologic: Speech and language are normal, face is symmetric, moves all 4 extremities voluntarily, at baseline without acute deficits on limited exam   4. HEENMT:  Head is atraumatic, normocephalic, pupils reactive to light, neck is supple, trachea is midline, mucous membranes are moist   5. Respiratory : Diminished  bilaterally otherwise lungs are clear to auscultation bilaterally without wheezing, rhonchi, rales, no cyanosis, no increase in work of breathing or accessory muscle use   6. Cardiovascular : Heart rate normal, rhythm is regular, no murmurs, rubs or gallops, trace peripheral edema, peripheral pulses palpated   7. Gastrointestinal:  Abdomen is soft, nondistended, nontender to palpation bowel sounds active, no masses or organomegaly palpated   8. Skin:  Skin is warm, dry and intact without rashes, acute lesions, or ulcers on limited exam   9.Musculoskeletal:  No acute deformities or trauma, no asymmetry in tone, trace peripheral edema, peripheral pulses palpated, no tenderness to palpation in the extremities  Data Reviewed: In the ED Temp 97.5-98.1, heart rate 83-97, respiratory rate 16-36, blood pressure 150/58-167/69, satting 86% on room air but 95% on 4 L nasal cannula Leukopenia at 3.9 Chemistry reveals an elevated creatinine at 1.20, BNP 301 Respiratory panel negative CTA chest shows no PE, small bilateral pleural effusions, multifocal pneumonia EKG shows a heart rate of 88, normal sinus rhythm, QTc 4 and 75 Patient was started on cefepime and vancomycin and also given Lasix in the ED Admission was requested for acute respiratory failure with hypoxia  Assessment and Plan: * Acute respiratory failure with hypoxia (HCC) - Likely multifactorial - Chest x-ray and CTA chest possible multifocal pneumonia - Patient also has bilateral pleural effusions, AKI, orthopnea, dyspnea on exertion that would be consistent with CHF exacerbation - Patient also has COPD which is likely contributing as well - No PE on imaging - Cefepime and vancomycin started in the ED, will de-escalate treatment to Rocephin and Zithromax - Continue Lasix - Wean off O2 as tolerated - Albuterol as needed - DuoNeb scheduled - Continue steroid - Continue Dulera - Monitor on telemetry  AKI (acute kidney injury)  (HCC) - Creatinine increased from 0.9>> 1.2 - Monitor intake and output - After 1 dose of 20 mg IV Lasix creatinine improved to baseline at 0.9 - Likely cardiorenal syndrome - Continue daily Lasix IV - Avoid nephrotoxic agents when possible - Continue to monitor  COPD with acute  exacerbation ([place]) - VBG pending - Diminished in the lower lung fields - Continue albuterol, DuoNeb, Dulera, steroid - Likely triggered by CAP  CAP (community acquired pneumonia) - Chest x-ray and CTA show possible multifocal pneumonia - Sputum culture, strep and Legionella urine antigens pending - Continue Rocephin and Zithromax  Sepsis ([place]) - Patient meets sepsis criteria, but appears clinically well and this pathology should resolve quickly - Leukopenia at 3.9, tachycardia at 97, tachypnea at 36, with acute respiratory failure with hypoxia and AKI - Seems to be related to multifocal pneumonia - Sputum culture pending, strep and Legionella antigens pending - Unfortunately blood cultures were not collected prior to broad-spectrum antibiotics in the ED - Cefepime and vancomycin started in the ED, given source is likely pneumonia will de-escalate to Rocephin and Zithromax - Procalcitonin with a.m. labs - Continue to monitor  Tobacco use - Smokes a pack per day - Nicotine patch ordered  Diabetes mellitus with complication (HCC) - Hold metformin - Sliding scale coverage - Continue reduced dose of basal insulin - Continue to monitor  HLD (hyperlipidemia) - Continue statin  Essential hypertension - Continue Norvasc - Holding lisinopril in the setting of AKI - Likely 2 be able to resume lisinopril soon as creatinine is trending towards normal      Advance Care Planning:   Code Status: Full Code  Consults: None at this time  Family Communication: No family at bedside  Severity of Illness: The appropriate patient status for this patient is INPATIENT. Inpatient status is judged to be  reasonable and necessary in order to provide the required intensity of service to ensure the patient's safety. The patient's presenting symptoms, physical exam findings, and initial radiographic and laboratory data in the context of their chronic comorbidities is felt to place them at high risk for further clinical deterioration. Furthermore, it is not anticipated that the patient will be medically stable for discharge from the hospital within 2 midnights of admission.   * I certify that at the point of admission it is my clinical judgment that the patient will require inpatient hospital care spanning beyond 2 midnights from the point of admission due to high intensity of service, high risk for further deterioration and high frequency of surveillance required.*  Author: Rolla Plate, DO 12/20/2022 3:21 AM  For on call review [email or web address].

## 2022-12-20 NOTE — Assessment & Plan Note (Signed)
-   Chest x-ray and CTA show possible multifocal pneumonia - Sputum culture, strep and Legionella urine antigens pending - Continue Rocephin and Zithromax

## 2022-12-20 NOTE — Assessment & Plan Note (Signed)
-   VBG pending - Diminished in the lower lung fields - Continue albuterol, DuoNeb, Dulera, steroid - Likely triggered by CAP

## 2022-12-20 NOTE — Assessment & Plan Note (Signed)
-   Creatinine increased from 0.9>> 1.2 - Monitor intake and output - After 1 dose of 20 mg IV Lasix creatinine improved to baseline at 0.9 - Likely cardiorenal syndrome - Continue daily Lasix IV - Avoid nephrotoxic agents when possible - Continue to monitor

## 2022-12-20 NOTE — TOC Progression Note (Signed)
  Transition of Care [facility]) Screening Note   Patient Details  Name: Misty Mccullough Date of Birth: 1971/03/25   Transition of Care [facility]) CM/SW Contact:    Shade Flood, LCSW Phone Number: 12/20/2022, 11:12 AM    Transition of Care Department [facility]) has reviewed patient and no TOC needs have been identified at this time. We will continue to monitor patient advancement through interdisciplinary progression rounds. If new patient transition needs arise, please place a TOC consult.

## 2022-12-20 NOTE — Progress Notes (Signed)
ASSUMPTION OF CARE NOTE   12/20/2022 1:49 PM  Misty Mccullough was seen and examined.  The H&P by the admitting provider, orders, imaging was reviewed.  Please see new orders.  Will continue to follow.   Vitals:   12/20/22 0901 12/20/22 0925  BP:  (!) 153/64  Pulse:  84  Resp:    Temp:  98.2 F (36.8 C)  SpO2: 90% 90%    Results for orders placed or performed during the hospital encounter of 12/19/22  Resp panel by RT-PCR (RSV, Flu A&B, Covid) Anterior Nasal Swab   Specimen: Anterior Nasal Swab  Result Value Ref Range   SARS Coronavirus 2 by RT PCR NEGATIVE NEGATIVE   Influenza A by PCR NEGATIVE NEGATIVE   Influenza B by PCR NEGATIVE NEGATIVE   Resp Syncytial Virus by PCR NEGATIVE NEGATIVE  Basic metabolic panel  Result Value Ref Range   Sodium 139 135 - 145 mmol/L   Potassium 4.0 3.5 - 5.1 mmol/L   Chloride 103 98 - 111 mmol/L   CO2 22 22 - 32 mmol/L   Glucose, Bld 127 (H) 70 - 99 mg/dL   BUN 10 6 - 20 mg/dL   Creatinine, Ser 1.20 (H) 0.44 - 1.00 mg/dL   Calcium 8.5 (L) 8.9 - 10.3 mg/dL   GFR, Estimated 54 (L) >60 mL/min   Anion gap 14 5 - 15  CBC  Result Value Ref Range   WBC 3.9 (L) 4.0 - 10.5 K/uL   RBC 3.20 (L) 3.87 - 5.11 MIL/uL   Hemoglobin 8.1 (L) 12.0 - 15.0 g/dL   HCT 26.4 (L) 36.0 - 46.0 %   MCV 82.5 80.0 - 100.0 fL   MCH 25.3 (L) 26.0 - 34.0 pg   MCHC 30.7 30.0 - 36.0 g/dL   RDW 17.8 (H) 11.5 - 15.5 %   Platelets 292 150 - 400 K/uL   nRBC 0.0 0.0 - 0.2 %  Brain natriuretic peptide  Result Value Ref Range   B Natriuretic Peptide 301.0 (H) 0.0 - 100.0 pg/mL  Basic metabolic panel  Result Value Ref Range   Sodium 137 135 - 145 mmol/L   Potassium 3.5 3.5 - 5.1 mmol/L   Chloride 104 98 - 111 mmol/L   CO2 23 22 - 32 mmol/L   Glucose, Bld 131 (H) 70 - 99 mg/dL   BUN 11 6 - 20 mg/dL   Creatinine, Ser 0.89 0.44 - 1.00 mg/dL   Calcium 8.4 (L) 8.9 - 10.3 mg/dL   GFR, Estimated >60 >60 mL/min   Anion gap 10 5 - 15  Strep pneumoniae urinary antigen  Result  Value Ref Range   Strep Pneumo Urinary Antigen NEGATIVE NEGATIVE  Blood gas, venous  Result Value Ref Range   pH, Ven 7.43 7.25 - 7.43   pCO2, Ven 38 (L) 44 - 60 mmHg   pO2, Ven 54 (H) 32 - 45 mmHg   Bicarbonate 25.2 20.0 - 28.0 mmol/L   Acid-Base Excess 1.0 0.0 - 2.0 mmol/L   O2 Saturation 87.5 %   Patient temperature 36.8    Collection site BLOOD RIGHT HAND    Drawn by [id number]   Comprehensive metabolic panel  Result Value Ref Range   Sodium 138 135 - 145 mmol/L   Potassium 3.7 3.5 - 5.1 mmol/L   Chloride 105 98 - 111 mmol/L   CO2 24 22 - 32 mmol/L   Glucose, Bld 122 (H) 70 - 99 mg/dL   BUN 10 6 - 20 mg/dL  Creatinine, Ser 0.83 0.44 - 1.00 mg/dL   Calcium 8.4 (L) 8.9 - 10.3 mg/dL   Total Protein 6.5 6.5 - 8.1 g/dL   Albumin 2.9 (L) 3.5 - 5.0 g/dL   AST 13 (L) 15 - 41 U/L   ALT 9 0 - 44 U/L   Alkaline Phosphatase 71 38 - 126 U/L   Total Bilirubin 0.5 0.3 - 1.2 mg/dL   GFR, Estimated >60 >60 mL/min   Anion gap 9 5 - 15  Magnesium  Result Value Ref Range   Magnesium 1.8 1.7 - 2.4 mg/dL  CBC with Differential/Platelet  Result Value Ref Range   WBC 3.8 (L) 4.0 - 10.5 K/uL   RBC 3.06 (L) 3.87 - 5.11 MIL/uL   Hemoglobin 7.5 (L) 12.0 - 15.0 g/dL   HCT 25.2 (L) 36.0 - 46.0 %   MCV 82.4 80.0 - 100.0 fL   MCH 24.5 (L) 26.0 - 34.0 pg   MCHC 29.8 (L) 30.0 - 36.0 g/dL   RDW 17.6 (H) 11.5 - 15.5 %   Platelets 264 150 - 400 K/uL   nRBC 0.0 0.0 - 0.2 %   Neutrophils Relative % 67 %   Neutro Abs 2.6 1.7 - 7.7 K/uL   Lymphocytes Relative 21 %   Lymphs Abs 0.8 0.7 - 4.0 K/uL   Monocytes Relative 8 %   Monocytes Absolute 0.3 0.1 - 1.0 K/uL   Eosinophils Relative 2 %   Eosinophils Absolute 0.1 0.0 - 0.5 K/uL   Basophils Relative 1 %   Basophils Absolute 0.0 0.0 - 0.1 K/uL   Immature Granulocytes 1 %   Abs Immature Granulocytes 0.02 0.00 - 0.07 K/uL  Glucose, capillary  Result Value Ref Range   Glucose-Capillary 139 (H) 70 - 99 mg/dL  Procalcitonin  Result Value Ref Range    Procalcitonin <0.10 ng/mL  Glucose, capillary  Result Value Ref Range   Glucose-Capillary 165 (H) 70 - 99 mg/dL  Glucose, capillary  Result Value Ref Range   Glucose-Capillary 182 (H) 70 - 99 mg/dL     Murvin Natal, MD [facility]   12/19/2022  1:06 PM How to contact the [facility] Attending or Consulting provider 7A - 7P or covering provider during after hours 7P -7A, for this patient?  Check the care team in [facility] and look for a) attending/consulting [facility] provider listed and b) the [facility] team listed Log into [email or web address] and use [place]'s universal password to access. If you do not have the password, please contact the hospital operator. Locate the [facility] provider you are looking for under [facility] and page to a number that you can be directly reached. If you still have difficulty reaching the provider, please page the [facility] (Director on Call) for the Hospitalists listed on amion for assistance.

## 2022-12-21 DIAGNOSIS — E785 Hyperlipidemia, unspecified: Secondary | ICD-10-CM | POA: Diagnosis not present

## 2022-12-21 DIAGNOSIS — J9601 Acute respiratory failure with hypoxia: Secondary | ICD-10-CM | POA: Diagnosis not present

## 2022-12-21 DIAGNOSIS — J441 Chronic obstructive pulmonary disease with (acute) exacerbation: Secondary | ICD-10-CM | POA: Diagnosis not present

## 2022-12-21 DIAGNOSIS — J189 Pneumonia, unspecified organism: Secondary | ICD-10-CM | POA: Diagnosis not present

## 2022-12-21 LAB — BASIC METABOLIC PANEL
Anion gap: 9 (ref 5–15)
BUN: 13 mg/dL (ref 6–20)
CO2: 24 mmol/L (ref 22–32)
Calcium: 8.8 mg/dL — ABNORMAL LOW (ref 8.9–10.3)
Chloride: 104 mmol/L (ref 98–111)
Creatinine, Ser: 0.74 mg/dL (ref 0.44–1.00)
GFR, Estimated: >60 mL/min (ref >60)
Glucose, Bld: 251 mg/dL — ABNORMAL HIGH (ref 70–99)
Potassium: 3.9 mmol/L (ref 3.5–5.1)
Sodium: 137 mmol/L (ref 135–145)

## 2022-12-21 LAB — CBC
HCT: 23.8 % — ABNORMAL LOW (ref 36.0–46.0)
Hemoglobin: 7.3 g/dL — ABNORMAL LOW (ref 12.0–15.0)
MCH: 25 pg — ABNORMAL LOW (ref 26.0–34.0)
MCHC: 30.7 g/dL (ref 30.0–36.0)
MCV: 81.5 fL (ref 80.0–100.0)
Platelets: 251 10*3/uL (ref 150–400)
RBC: 2.92 MIL/uL — ABNORMAL LOW (ref 3.87–5.11)
RDW: 17.3 % — ABNORMAL HIGH (ref 11.5–15.5)
WBC: 4 10*3/uL (ref 4.0–10.5)
nRBC: 0 % (ref 0.0–0.2)

## 2022-12-21 LAB — GLUCOSE, CAPILLARY
Glucose-Capillary: 195 mg/dL — ABNORMAL HIGH (ref 70–99)
Glucose-Capillary: 273 mg/dL — ABNORMAL HIGH (ref 70–99)

## 2022-12-21 LAB — MAGNESIUM: Magnesium: 2.1 mg/dL (ref 1.7–2.4)

## 2022-12-21 MED ORDER — DOXYCYCLINE HYCLATE 100 MG PO CAPS: 100.0000 mg | ORAL_CAPSULE | Freq: Two times a day (BID) | ORAL | 0 refills | Status: AC

## 2022-12-21 MED ORDER — PREDNISONE 20 MG PO TABS: 20.0000 mg | ORAL_TABLET | Freq: Every day | ORAL | 0 refills | Status: AC

## 2022-12-21 NOTE — Discharge Summary (Addendum)
Physician Discharge Summary  Misty Mccullough [id number] DOB: June 19, 1971 DOA: 12/19/2022  PCP: Fenton Foy, NP  Admit date: 12/19/2022 Discharge date: 12/21/2022  Admitted From:  Home  Disposition: Home   Recommendations for Outpatient Follow-up:  Follow up with PCP in 1 weeks  Please follow up on the following pending results: final culture data   Discharge Condition: STABLE   CODE STATUS: FULL DIET: carb modified low sodium heart healthy    Brief Hospitalization Summary: Please see all hospital notes, images, labs for full details of the hospitalization. ADMISSION HPI:  52 y.o. female with medical history significant of diastolic CHF, COPD, hypertension, GERD, hyperlipidemia, diabetes mellitus type 2, and more presents the ED with a chief complaint of abnormal labs.  Patient reports she had gone to her PCP for routine follow-up and blood was drawn.  They called her and told her to come into the ER because her hemoglobin was 7.0.  It is 8.1.  This seems to be her baseline for this year.  Last year she did have hemoglobin of 10.8, but this year it has been strictly in the eights.  Patient denies melena, hematuria, bruising, hemoptysis, hematemesis, epistaxis.  Patient denies chest pain, lightheadedness, paresthesias.  Upon workup in the ER she was found to have multifocal pneumonia.  Patient reports that she had some shortness of breath about a month ago when she was in the hospital and found that she had CHF.  She reports that this was a new finding but that she has been on Lasix for a long time.  After she was discharged she felt better for 2 weeks, then 2 weeks ago she started feeling short of breath again.  Patient reports she has been using her rescue inhaler every 4 hours.  On good days she only has to use it every 8 hours.  Patient reports she has had a cough that been productive of clear sputum.  She has not had a fever.  She has had no sick contacts.  Patient denies any new myalgias,  but reports she has chronic leg pain bilaterally.  Patient reports her dyspnea is worse on exertion.  She has had orthopnea ever since she was discharged from her last hospitalization, so that is not necessarily new.  Patient reports she has been taking her Lasix every day.  She was told that she had weight gain to double her Lasix dose to 40 mg daily.  Patient reports that she has had about 15 pounds of weight gain, so she has been taking the 40 mg of p.o. Lasix at home.  Patient does not have an oxygen requirement at home, but is requiring 4 L nasal cannula in the hospital.  Patient has no other complaints at this time.   Patient does smoke a pack per day.  She does not drink alcohol, does not use illicit drugs.  She is vaccinated for COVID and flu.  Patient is full code.  Hospital Course   Pt was admitted for treatment of pneumonia and acute respiratory failure with hypoxia and COPD exacerbation.  She was treated with IV antibiotics, steroids, bronchodilators and supplemental oxygen and supportive measures.  Fortunately she rapidly improved with these therapies and now stable to discharge home with close outpatient follow up.  She has follow up with cardiology, pulmonology already arranged. Tobacco cessation strongly advised and written information provided.   Discharge Diagnoses:  Principal Problem:   Acute respiratory failure with hypoxia [facility]) Active Problems:   Essential hypertension  HLD (hyperlipidemia)   Diabetes mellitus with complication (HCC)   Tobacco use   Sepsis ([place])   CAP (community acquired pneumonia)   COPD with acute exacerbation ([place])   AKI (acute kidney injury) [facility])   Discharge Instructions:  Allergies as of 12/21/2022   No Known Allergies      Medication List     STOP taking these medications    amLODipine 10 MG tablet Commonly known as: NORVASC   BD ULTRA-FINE PEN NEEDLES 29G X 12.7MM Misc Generic drug: Insulin Pen Needle    dextromethorphan-guaiFENesin 30-600 MG 12hr tablet Commonly known as: MUCINEX DM   Insulin Syringes (Disposable) U-100 0.3 ML Misc   nicotine 14 mg/24hr patch Commonly known as: NICODERM CQ - dosed in mg/24 hours   predniSONE 20 MG tablet Commonly known as: DELTASONE   thiamine 100 MG tablet Commonly known as: VITAMIN B1       TAKE these medications    albuterol 108 (90 Base) MCG/ACT inhaler Commonly known as: VENTOLIN HFA Inhale 2 puffs into the lungs every 6 (six) hours as needed for wheezing or shortness of breath.   albuterol (2.5 MG/3ML) 0.083% nebulizer solution Commonly known as: PROVENTIL Take 3 mLs (2.5 mg total) by nebulization every 4 (four) hours as needed for wheezing or shortness of breath.   aspirin EC 81 MG tablet Take 1 tablet (81 mg total) by mouth daily with breakfast. Swallow whole.   atorvastatin 10 MG tablet Commonly known as: LIPITOR TAKE 1 TABLET (10 MG TOTAL) BY MOUTH DAILY.   blood glucose meter kit and supplies Kit Dispense based on patient and insurance preference. Use up to four times daily as directed.   Compressor/Nebulizer Misc 1 Units by Does not apply route as needed.   doxycycline 100 MG capsule Commonly known as: VIBRAMYCIN Take 1 capsule (100 mg total) by mouth 2 (two) times daily for 3 days. Start taking on: December 22, 2022   fluticasone-salmeterol 100-50 MCG/ACT Aepb Commonly known as: ADVAIR Inhale 1 puff into the lungs 2 (two) times daily.   furosemide 20 MG tablet Commonly known as: LASIX Take 1 tablet (20 mg total) by mouth daily. May take Additional tablet ( 20 mg) if you gain more than 3 pounds in 1 day or more than 5 pounds in 1 week   insulin glargine 100 UNIT/ML Solostar Pen Commonly known as: LANTUS Inject 31 Units into the skin at bedtime.   lisinopril 40 MG tablet Commonly known as: ZESTRIL Take 1 tablet (40 mg total) by mouth daily.   metFORMIN 1000 MG tablet Commonly known as: GLUCOPHAGE Take 1 tablet  (1,000 mg total) by mouth 2 (two) times daily with a meal.   omeprazole 20 MG capsule Commonly known as: PRILOSEC Take 1 capsule (20 mg total) by mouth daily.   onetouch ultrasoft lancets Use as instructed   Spiriva Respimat 2.5 MCG/ACT Aers Generic drug: Tiotropium Bromide Monohydrate Inhale 2 puffs into the lungs daily.       ASK your doctor about these medications    predniSONE 20 MG tablet Commonly known as: DELTASONE Take 1 tablet (20 mg total) by mouth daily with breakfast for 5 days. Ask about: Should I take this medication?        Follow-up Information     Fenton Foy, NP. Schedule an appointment as soon as possible for a visit in 1 week(s).   Specialties: Pulmonary Disease, Endocrinology Why: Hospital Follow Up Contact information: [address] [place]  [address] [phone]                No Known Allergies Allergies as of 12/21/2022   No Known Allergies      Medication List     STOP taking these medications    amLODipine 10 MG tablet Commonly known as: NORVASC   BD ULTRA-FINE PEN NEEDLES 29G X 12.7MM Misc Generic drug: Insulin Pen Needle   dextromethorphan-guaiFENesin 30-600 MG 12hr tablet Commonly known as: MUCINEX DM   Insulin Syringes (Disposable) U-100 0.3 ML Misc   nicotine 14 mg/24hr patch Commonly known as: NICODERM CQ - dosed in mg/24 hours   predniSONE 20 MG tablet Commonly known as: DELTASONE   thiamine 100 MG tablet Commonly known as: VITAMIN B1       TAKE these medications    albuterol 108 (90 Base) MCG/ACT inhaler Commonly known as: VENTOLIN HFA Inhale 2 puffs into the lungs every 6 (six) hours as needed for wheezing or shortness of breath.   albuterol (2.5 MG/3ML) 0.083% nebulizer solution Commonly known as: PROVENTIL Take 3 mLs (2.5 mg total) by nebulization every 4 (four) hours as needed for wheezing or shortness of breath.   aspirin EC 81 MG tablet Take 1 tablet (81 mg total) by  mouth daily with breakfast. Swallow whole.   atorvastatin 10 MG tablet Commonly known as: LIPITOR TAKE 1 TABLET (10 MG TOTAL) BY MOUTH DAILY.   blood glucose meter kit and supplies Kit Dispense based on patient and insurance preference. Use up to four times daily as directed.   Compressor/Nebulizer Misc 1 Units by Does not apply route as needed.   doxycycline 100 MG capsule Commonly known as: VIBRAMYCIN Take 1 capsule (100 mg total) by mouth 2 (two) times daily for 3 days. Start taking on: December 22, 2022   fluticasone-salmeterol 100-50 MCG/ACT Aepb Commonly known as: ADVAIR Inhale 1 puff into the lungs 2 (two) times daily.   furosemide 20 MG tablet Commonly known as: LASIX Take 1 tablet (20 mg total) by mouth daily. May take Additional tablet ( 20 mg) if you gain more than 3 pounds in 1 day or more than 5 pounds in 1 week   insulin glargine 100 UNIT/ML Solostar Pen Commonly known as: LANTUS Inject 31 Units into the skin at bedtime.   lisinopril 40 MG tablet Commonly known as: ZESTRIL Take 1 tablet (40 mg total) by mouth daily.   metFORMIN 1000 MG tablet Commonly known as: GLUCOPHAGE Take 1 tablet (1,000 mg total) by mouth 2 (two) times daily with a meal.   omeprazole 20 MG capsule Commonly known as: PRILOSEC Take 1 capsule (20 mg total) by mouth daily.   onetouch ultrasoft lancets Use as instructed   Spiriva Respimat 2.5 MCG/ACT Aers Generic drug: Tiotropium Bromide Monohydrate Inhale 2 puffs into the lungs daily.       ASK your doctor about these medications    predniSONE 20 MG tablet Commonly known as: DELTASONE Take 1 tablet (20 mg total) by mouth daily with breakfast for 5 days. Ask about: Should I take this medication?        Procedures/Studies: CT Angio Chest PE W and/or Wo Contrast  Result Date: 12/19/2022 CLINICAL DATA:  High probability for PE.  Shortness of breath. EXAM: CT ANGIOGRAPHY CHEST WITH CONTRAST TECHNIQUE: Multidetector CT imaging  of the chest was performed using the standard protocol during bolus administration of intravenous contrast. Multiplanar CT image reconstructions and MIPs were obtained to evaluate the vascular anatomy. RADIATION DOSE REDUCTION:  This exam was performed according to the departmental dose-optimization program which includes automated exposure control, adjustment of the mA and/or kV according to patient size and/or use of iterative reconstruction technique. CONTRAST:  54mL OMNIPAQUE IOHEXOL 350 MG/ML SOLN COMPARISON:  Chest x-ray 12/19/2022. FINDINGS: Cardiovascular: Satisfactory opacification of the pulmonary arteries to the segmental level. No evidence of pulmonary embolism. Normal heart size. No pericardial effusion. Mediastinum/Nodes: There is an enlarged subcarinal lymph node measuring 13 mm. There are nonenlarged bilateral hilar and paratracheal as well as prevascular lymph nodes. The visualized thyroid gland and esophagus are within normal limits. Lungs/Pleura: There are small bilateral pleural effusions, right greater than left. There are multifocal patchy airspace opacities in the bilateral lower lobes, right middle lobe and lingula. These are predominantly seen peripherally. There is minimal scarring in the lung apices. No evidence for pneumothorax. There are minimal secretions in the trachea. Upper Abdomen: No acute abnormality. Musculoskeletal: No chest wall abnormality. No acute or significant osseous findings. Review of the MIP images confirms the above findings. IMPRESSION: 1. No evidence for pulmonary embolism. 2. Small bilateral pleural effusions, right greater than left. 3. Multifocal patchy airspace opacities in the bilateral lower lobes, right middle lobe and lingula worrisome for multifocal pneumonia. 4. Enlarged subcarinal lymph node, likely reactive. 5. Minimal secretions in the trachea. Electronically Signed   By: Ronney Asters M.D.   On: 12/19/2022 17:47   DG Chest 2 View  Result Date:  12/19/2022 CLINICAL DATA:  Shortness of breath EXAM: CHEST - 2 VIEW COMPARISON:  Chest x-ray dated November 14, 2018 FINDINGS: Cardiac and mediastinal contours are unchanged. Worsened patchy opacities of the left lung base with new nodular opacity of the right lower lobe, given interval worsening. Similar mild background diffuse interstitial opacities. New trace bilateral pleural effusions. No evidence of pneumothorax. IMPRESSION: 1. Worsened patchy opacities of the left lung base with new nodular opacity of the right lower lobe, given interval worsening, chest CT is recommended for further evaluation. 2. Mild diffuse interstitial opacities, similar to prior and likely due to pulmonary edema. 3. New trace bilateral pleural effusions. Electronically Signed   By: Yetta Glassman M.D.   On: 12/19/2022 15:57     Subjective: Pt says she is breathing better, ambulating in room and hallways.  No SOB or CP.   Discharge Exam: Vitals:   12/21/22 0750 12/21/22 0800  BP:  138/72  Pulse:  95  Resp:  16  Temp:  98 F (36.7 C)  SpO2: 98% 98%   Vitals:   12/21/22 0416 12/21/22 0745 12/21/22 0750 12/21/22 0800  BP: (!) 141/66   138/72  Pulse: 96   95  Resp: 18   16  Temp: 97.9 F (36.6 C)   98 F (36.7 C)  TempSrc:    Oral  SpO2: 92% 98% 98% 98%  Weight:      Height:       General: Pt is alert, awake, not in acute distress Cardiovascular: normal S1/S2 +, no rubs, no gallops Respiratory: CTA bilaterally, no wheezing, no rhonchi Abdominal: Soft, NT, ND, bowel sounds + Extremities: no edema, no cyanosis   The results of significant diagnostics from this hospitalization (including imaging, microbiology, ancillary and laboratory) are listed below for reference.     Microbiology: Recent Results (from the past 240 hour(s))  Resp panel by RT-PCR (RSV, Flu A&B, Covid) Anterior Nasal Swab     Status: None   Collection Time: 12/19/22  7:20 PM   Specimen: Anterior Nasal Swab  Result Value Ref Range  Status   SARS Coronavirus 2 by RT PCR NEGATIVE NEGATIVE Final    Comment: (NOTE) SARS-CoV-2 target nucleic acids are NOT DETECTED.  The SARS-CoV-2 RNA is generally detectable in upper respiratory specimens during the acute phase of infection. The lowest concentration of SARS-CoV-2 viral copies this assay can detect is 138 copies/mL. A negative result does not preclude SARS-Cov-2 infection and should not be used as the sole basis for treatment or other patient management decisions. A negative result may occur with  improper specimen collection/handling, submission of specimen other than nasopharyngeal swab, presence of viral mutation(s) within the areas targeted by this assay, and inadequate number of viral copies(<138 copies/mL). A negative result must be combined with clinical observations, patient history, and epidemiological information. The expected result is Negative.  Fact Sheet for Patients:  EntrepreneurPulse.com.au  Fact Sheet for Healthcare Providers:  IncredibleEmployment.be  This test is no t yet approved or cleared by the [place] FDA and  has been authorized for detection and/or diagnosis of SARS-CoV-2 by FDA under an Emergency Use Authorization (EUA). This EUA will remain  in effect (meaning this test can be used) for the duration of the COVID-19 declaration under Section 564(b)(1) of the Act, 21 U.S.C.section 360bbb-3(b)(1), unless the authorization is terminated  or revoked sooner.       Influenza A by PCR NEGATIVE NEGATIVE Final   Influenza B by PCR NEGATIVE NEGATIVE Final    Comment: (NOTE) The Xpert Xpress SARS-CoV-2/FLU/RSV plus assay is intended as an aid in the diagnosis of influenza from Nasopharyngeal swab specimens and should not be used as a sole basis for treatment. Nasal washings and aspirates are unacceptable for Xpert Xpress SARS-CoV-2/FLU/RSV testing.  Fact Sheet for  Patients: EntrepreneurPulse.com.au  Fact Sheet for Healthcare Providers: IncredibleEmployment.be  This test is not yet approved or cleared by the [place] FDA and has been authorized for detection and/or diagnosis of SARS-CoV-2 by FDA under an Emergency Use Authorization (EUA). This EUA will remain in effect (meaning this test can be used) for the duration of the COVID-19 declaration under Section 564(b)(1) of the Act, 21 U.S.C. section 360bbb-3(b)(1), unless the authorization is terminated or revoked.     Resp Syncytial Virus by PCR NEGATIVE NEGATIVE Final    Comment: (NOTE) Fact Sheet for Patients: EntrepreneurPulse.com.au  Fact Sheet for Healthcare Providers: IncredibleEmployment.be  This test is not yet approved or cleared by the [place] FDA and has been authorized for detection and/or diagnosis of SARS-CoV-2 by FDA under an Emergency Use Authorization (EUA). This EUA will remain in effect (meaning this test can be used) for the duration of the COVID-19 declaration under Section 564(b)(1) of the Act, 21 U.S.C. section 360bbb-3(b)(1), unless the authorization is terminated or revoked.  Performed at [facility], [address] [place], [place] [address]      Labs: BNP (last 3 results) Recent Labs    11/14/22 1115 12/19/22 1410  BNP 333.0* 123XX123*   Basic Metabolic Panel: Recent Labs  Lab 12/18/22 1424 12/19/22 1410 12/19/22 2337 12/20/22 0528 12/21/22 0508  NA 142 139 137 138 137  K 3.9 4.0 3.5 3.7 3.9  CL 107* 103 104 105 104  CO2 22 22 23 24 24   GLUCOSE 118* 127* 131* 122* 251*  BUN 12 10 11 10 13   CREATININE 0.90 1.20* 0.89 0.83 0.74  CALCIUM 9.0 8.5* 8.4* 8.4* 8.8*  MG  --   --   --  1.8 2.1   Liver Function  Tests: Recent Labs  Lab 12/18/22 1424 12/20/22 0528  AST 12 13*  ALT 8 9  ALKPHOS 96 71  BILITOT <0.2 0.5  PROT 6.2 6.5  ALBUMIN 3.6* 2.9*   No results  for input(s): "LIPASE", "AMYLASE" in the last 168 hours. No results for input(s): "AMMONIA" in the last 168 hours. CBC: Recent Labs  Lab 12/18/22 1424 12/19/22 1410 12/20/22 0528 12/21/22 0508  WBC 3.9 3.9* 3.8* 4.0  NEUTROABS  --   --  2.6  --   HGB 7.9* 8.1* 7.5* 7.3*  HCT 24.8* 26.4* 25.2* 23.8*  MCV 79 82.5 82.4 81.5  PLT 311 292 264 251   Cardiac Enzymes: No results for input(s): "CKTOTAL", "CKMB", "CKMBINDEX", "TROPONINI" in the last 168 hours. BNP: Invalid input(s): "POCBNP" CBG: Recent Labs  Lab 12/20/22 0714 12/20/22 1123 12/20/22 1630 12/20/22 2110 12/21/22 0710  GLUCAP 165* 182* 291* 421* 195*   D-Dimer No results for input(s): "DDIMER" in the last 72 hours. Hgb A1c Recent Labs    12/18/22 1347  HGBA1C 7.9*   Lipid Profile Recent Labs    12/18/22 1424  CHOL 234*  HDL 57  LDLCALC 167*  TRIG 62  CHOLHDL 4.1   Thyroid function studies No results for input(s): "TSH", "T4TOTAL", "T3FREE", "THYROIDAB" in the last 72 hours.  Invalid input(s): "FREET3" Anemia work up No results for input(s): "VITAMINB12", "FOLATE", "FERRITIN", "TIBC", "IRON", "RETICCTPCT" in the last 72 hours. Urinalysis    Component Value Date/Time   COLORURINE COLORLESS (A) 11/14/2022 1230   APPEARANCEUR CLEAR 11/14/2022 1230   LABSPEC 1.003 (L) 11/14/2022 1230   PHURINE 5.0 11/14/2022 1230   GLUCOSEU NEGATIVE 11/14/2022 1230   HGBUR SMALL (A) 11/14/2022 1230   BILIRUBINUR NEGATIVE 11/14/2022 1230   BILIRUBINUR negative 02/27/2022 1001   BILIRUBINUR neg 01/01/2021 1508   KETONESUR NEGATIVE 11/14/2022 1230   PROTEINUR 100 (A) 11/14/2022 1230   UROBILINOGEN 0.2 02/27/2022 1001   UROBILINOGEN 1.0 12/29/2017 1154   NITRITE NEGATIVE 11/14/2022 1230   LEUKOCYTESUR NEGATIVE 11/14/2022 1230   Sepsis Labs Recent Labs  Lab 12/18/22 1424 12/19/22 1410 12/20/22 0528 12/21/22 0508  WBC 3.9 3.9* 3.8* 4.0   Microbiology Recent Results (from the past 240 hour(s))  Resp panel by  RT-PCR (RSV, Flu A&B, Covid) Anterior Nasal Swab     Status: None   Collection Time: 12/19/22  7:20 PM   Specimen: Anterior Nasal Swab  Result Value Ref Range Status   SARS Coronavirus 2 by RT PCR NEGATIVE NEGATIVE Final    Comment: (NOTE) SARS-CoV-2 target nucleic acids are NOT DETECTED.  The SARS-CoV-2 RNA is generally detectable in upper respiratory specimens during the acute phase of infection. The lowest concentration of SARS-CoV-2 viral copies this assay can detect is 138 copies/mL. A negative result does not preclude SARS-Cov-2 infection and should not be used as the sole basis for treatment or other patient management decisions. A negative result may occur with  improper specimen collection/handling, submission of specimen other than nasopharyngeal swab, presence of viral mutation(s) within the areas targeted by this assay, and inadequate number of viral copies(<138 copies/mL). A negative result must be combined with clinical observations, patient history, and epidemiological information. The expected result is Negative.  Fact Sheet for Patients:  EntrepreneurPulse.com.au  Fact Sheet for Healthcare Providers:  IncredibleEmployment.be  This test is no t yet approved or cleared by the [place] FDA and  has been authorized for detection and/or diagnosis of SARS-CoV-2 by FDA under an Emergency Use Authorization (EUA). This  EUA will remain  in effect (meaning this test can be used) for the duration of the COVID-19 declaration under Section 564(b)(1) of the Act, 21 U.S.C.section 360bbb-3(b)(1), unless the authorization is terminated  or revoked sooner.       Influenza A by PCR NEGATIVE NEGATIVE Final   Influenza B by PCR NEGATIVE NEGATIVE Final    Comment: (NOTE) The Xpert Xpress SARS-CoV-2/FLU/RSV plus assay is intended as an aid in the diagnosis of influenza from Nasopharyngeal swab specimens and should not be used as a sole basis  for treatment. Nasal washings and aspirates are unacceptable for Xpert Xpress SARS-CoV-2/FLU/RSV testing.  Fact Sheet for Patients: EntrepreneurPulse.com.au  Fact Sheet for Healthcare Providers: IncredibleEmployment.be  This test is not yet approved or cleared by the [place] FDA and has been authorized for detection and/or diagnosis of SARS-CoV-2 by FDA under an Emergency Use Authorization (EUA). This EUA will remain in effect (meaning this test can be used) for the duration of the COVID-19 declaration under Section 564(b)(1) of the Act, 21 U.S.C. section 360bbb-3(b)(1), unless the authorization is terminated or revoked.     Resp Syncytial Virus by PCR NEGATIVE NEGATIVE Final    Comment: (NOTE) Fact Sheet for Patients: EntrepreneurPulse.com.au  Fact Sheet for Healthcare Providers: IncredibleEmployment.be  This test is not yet approved or cleared by the [place] FDA and has been authorized for detection and/or diagnosis of SARS-CoV-2 by FDA under an Emergency Use Authorization (EUA). This EUA will remain in effect (meaning this test can be used) for the duration of the COVID-19 declaration under Section 564(b)(1) of the Act, 21 U.S.C. section 360bbb-3(b)(1), unless the authorization is terminated or revoked.  Performed at [facility], [address] [place], [place] [address]     Time coordinating discharge: 35 mins   SIGNED:  Irwin Brakeman, MD  [facility] 12/21/2022, 11:04 AM How to contact the [facility] Attending or Consulting provider [place] or covering provider during after hours [place], for this patient?  Check the care team in [facility] and look for a) attending/consulting [facility] provider listed and b) the [facility] team listed Log into [email or web address] and use 's universal password to access. If you do not have the password, please contact the hospital operator. Locate the [facility]  provider you are looking for under [facility] and page to a number that you can be directly reached. If you still have difficulty reaching the provider, please page the [facility] (Director on Call) for the Hospitalists listed on amion for assistance.

## 2022-12-21 NOTE — Discharge Instructions (Signed)
IMPORTANT INFORMATION: PAY CLOSE ATTENTION   PHYSICIAN DISCHARGE INSTRUCTIONS  Follow with Primary care provider  Fenton Foy, NP  and other consultants as instructed by your Hospitalist Physician  [place] IF SYMPTOMS COME BACK, WORSEN OR NEW PROBLEM DEVELOPS   Please note: You were cared for by a hospitalist during your hospital stay. Every effort will be made to forward records to your primary care provider.  You can request that your primary care provider send for your hospital records if they have not received them.  Once you are discharged, your primary care physician will handle any further medical issues. Please note that NO REFILLS for any discharge medications will be authorized once you are discharged, as it is imperative that you return to your primary care physician (or establish a relationship with a primary care physician if you do not have one) for your post hospital discharge needs so that they can reassess your need for medications and monitor your lab values.  Please get a complete blood count and chemistry panel checked by your Primary MD at your next visit, and again as instructed by your Primary MD.  Get Medicines reviewed and adjusted: Please take all your medications with you for your next visit with your Primary MD  Laboratory/radiological data: Please request your Primary MD to go over all hospital tests and procedure/radiological results at the follow up, please ask your primary care provider to get all Hospital records sent to his/her office.  In some cases, they will be blood work, cultures and biopsy results pending at the time of your discharge. Please request that your primary care provider follow up on these results.  If you are diabetic, please bring your blood sugar readings with you to your follow up appointment with primary care.    Please call and make your follow up appointments as soon as possible.    Also Note  the following: If you experience worsening of your admission symptoms, develop shortness of breath, life threatening emergency, suicidal or homicidal thoughts you must seek medical attention immediately by calling 911 or calling your MD immediately  if symptoms less severe.  You must read complete instructions/literature along with all the possible adverse reactions/side effects for all the Medicines you take and that have been prescribed to you. Take any new Medicines after you have completely understood and accpet all the possible adverse reactions/side effects.   Do not drive when taking Pain medications or sleeping medications (Benzodiazepines)  Do not take more than prescribed Pain, Sleep and Anxiety Medications. It is not advisable to combine anxiety,sleep and pain medications without talking with your primary care practitioner  Special Instructions: If you have smoked or chewed Tobacco  in the last 2 yrs please stop smoking, stop any regular Alcohol  and or any Recreational drug use.  Wear Seat belts while driving.  Do not drive if taking any narcotic, mind altering or controlled substances or recreational drugs or alcohol.

## 2022-12-23 ENCOUNTER — Ambulatory Visit: Payer: Medicaid Other | Admitting: Podiatry

## 2022-12-23 ENCOUNTER — Telehealth: Payer: Self-pay

## 2022-12-23 NOTE — Progress Notes (Signed)
   Care Guide Note  12/23/2022 Name: Misty Mccullough MRN: [id number] DOB: 1971/05/16  Referred by: Fenton Foy, NP Reason for referral : Care Coordination (Outreach to schedule referral  )   Misty Mccullough is a 52 y.o. year old female who is a primary care patient of Fenton Foy, NP. Misty Mccullough was referred to the pharmacist for assistance related to DM.    Successful contact was made with the patient to discuss pharmacy services including being ready for the pharmacist to call at least 5 minutes before the scheduled appointment time, to have medication bottles and any blood sugar or blood pressure readings ready for review. The patient agreed to meet with the pharmacist via with the pharmacist via telephone visit on (date/time).  01/16/2023  Noreene Larsson, [place], [place] [address] Direct Dial: [phone] [email or web address]

## 2022-12-24 LAB — LEGIONELLA PNEUMOPHILA SEROGP 1 UR AG: L. pneumophila Serogp 1 Ur Ag: NEGATIVE

## 2022-12-25 ENCOUNTER — Encounter: Payer: Self-pay | Admitting: Nurse Practitioner

## 2022-12-25 ENCOUNTER — Ambulatory Visit (INDEPENDENT_AMBULATORY_CARE_PROVIDER_SITE_OTHER): Payer: Medicaid Other | Admitting: Nurse Practitioner

## 2022-12-25 ENCOUNTER — Telehealth: Payer: Self-pay | Admitting: Nurse Practitioner

## 2022-12-25 VITALS — BP 153/50 | HR 73 | Temp 97.4°F | Ht 64.0 in | Wt 159.6 lb

## 2022-12-25 DIAGNOSIS — Z1211 Encounter for screening for malignant neoplasm of colon: Secondary | ICD-10-CM | POA: Diagnosis not present

## 2022-12-25 DIAGNOSIS — D649 Anemia, unspecified: Secondary | ICD-10-CM | POA: Diagnosis not present

## 2022-12-25 DIAGNOSIS — I10 Essential (primary) hypertension: Secondary | ICD-10-CM

## 2022-12-25 MED ORDER — FOLIC ACID 800 MCG PO TABS: 800.0000 ug | ORAL_TABLET | Freq: Every day | ORAL | 2 refills | Status: DC

## 2022-12-25 MED ORDER — CLONIDINE HCL 0.1 MG PO TABS
0.1000 mg | ORAL_TABLET | Freq: Once | ORAL | Status: AC
  Administered 2022-12-25: 0.1 mg via ORAL

## 2022-12-25 MED ORDER — IRON (FERROUS SULFATE) 325 (65 FE) MG PO TABS: 325.0000 mg | ORAL_TABLET | Freq: Every day | ORAL | 2 refills | Status: DC

## 2022-12-25 NOTE — Telephone Encounter (Signed)
scheduled per 3/20 referral, pt has been called and confirmed date and time. Pt is aware of location and to arrive early for check in

## 2022-12-25 NOTE — Assessment & Plan Note (Signed)
-   Cologuard  2. Essential hypertension  - cloNIDine (CATAPRES) tablet 0.1 mg - CBC - Comprehensive metabolic panel - Iron, TIBC and Ferritin Panel - AMB Referral to Pharmacy Medication Management  3. Anemia, unspecified type  - CBC - Comprehensive metabolic panel - Iron, TIBC and Ferritin Panel - Iron, Ferrous Sulfate, 325 (65 Fe) MG TABS; Take 325 mg by mouth daily.  Dispense: 30 tablet; Refill: 2 - folic acid (FOLVITE) Q000111Q MCG tablet; Take 1 tablet (800 mcg total) by mouth daily.  Dispense: 30 tablet; Refill: 2  Follow up:  Follow up in 1 month

## 2022-12-25 NOTE — Patient Instructions (Addendum)
1. Colon cancer screening  - Cologuard  2. Essential hypertension  - cloNIDine (CATAPRES) tablet 0.1 mg - CBC - Comprehensive metabolic panel - Iron, TIBC and Ferritin Panel - AMB Referral to Pharmacy Medication Management  3. Anemia, unspecified type  - CBC - Comprehensive metabolic panel - Iron, TIBC and Ferritin Panel - Iron, Ferrous Sulfate, 325 (65 Fe) MG TABS; Take 325 mg by mouth daily.  Dispense: 30 tablet; Refill: 2 - folic acid (FOLVITE) Q000111Q MCG tablet; Take 1 tablet (800 mcg total) by mouth daily.  Dispense: 30 tablet; Refill: 2  Follow up:  Follow up in 1 month

## 2022-12-25 NOTE — Progress Notes (Signed)
@Patient  ID: Misty Mccullough, female    DOB: [birth year], 52 y.o.   MRN: [id number]  Chief Complaint  Patient presents with   [facility] follow up.    Referring provider: Fenton Foy, NP  Recent significant events:  Hospital admission: 12/19/22  Pt was admitted for treatment of pneumonia and acute respiratory failure with hypoxia and COPD exacerbation. She was treated with IV antibiotics, steroids, bronchodilators and supplemental oxygen and supportive measures. Fortunately she rapidly improved with these therapies and now stable to discharge home with close outpatient follow up.    HPI  Patient presents for hospital follow-up please see notes above.  Patient is much improved since  hospital discharge.  It was noted that her hemoglobin was low at 7.3 at hospital discharge.  We will recheck this today.  We will start her on folic acid and iron supplement.  Will place referral to hematology for further evaluation.  Patient does have upcoming appointments with cardiology and pulmonary. Denies f/c/s, n/v/d, hemoptysis, PND, leg swelling Denies chest pain or edema  Note: blood pressure was elevated in office. Clonidine given per protocol     No Known Allergies  Immunization History  Administered Date(s) Administered   Influenza Whole 06/21/2011   Influenza,inj,Quad PF,6+ Mos 09/06/2016, 08/17/2018, 08/25/2019, 06/29/2021, 11/15/2022   PFIZER Comirnaty(Gray Top)Covid-19 Tri-Sucrose Vaccine 01/15/2020, 02/09/2020   Pneumococcal Conjugate-13 08/25/2019   Pneumococcal Polysaccharide-23 12/06/2016   Pneumococcal-Unspecified 06/21/2011   Tdap 12/06/2016    Past Medical History:  Diagnosis Date   Bilateral lower extremity edema    Cough    Diabetes mellitus without complication ([place])    Gallstones    Heavy cigarette smoker    Hemoglobin A1C between 7% and 9% indicating borderline diabetic control ([place]) 08/2019   Hypercholesteremia    Hyperglycemia    Hypertension     Shortness of breath    Vitamin D deficiency 12/2020    Tobacco History: Social History   Tobacco Use  Smoking Status Every Day   Packs/day: 1.00   Years: 32.00   Additional pack years: 0.00   Total pack years: 32.00   Types: Cigarettes  Smokeless Tobacco Never  Tobacco Comments   Since age 81.   Ready to quit: Not Answered Counseling given: Not Answered Tobacco comments: Since age 32.   Outpatient Encounter Medications as of 12/25/2022  Medication Sig   albuterol (PROVENTIL) (2.5 MG/3ML) 0.083% nebulizer solution Take 3 mLs (2.5 mg total) by nebulization every 4 (four) hours as needed for wheezing or shortness of breath.   albuterol (VENTOLIN HFA) 108 (90 Base) MCG/ACT inhaler Inhale 2 puffs into the lungs every 6 (six) hours as needed for wheezing or shortness of breath.   atorvastatin (LIPITOR) 10 MG tablet TAKE 1 TABLET (10 MG TOTAL) BY MOUTH DAILY.   blood glucose meter kit and supplies KIT Dispense based on patient and insurance preference. Use up to four times daily as directed.   fluticasone-salmeterol (ADVAIR) 100-50 MCG/ACT AEPB Inhale 1 puff into the lungs 2 (two) times daily.   folic acid (FOLVITE) Q000111Q MCG tablet Take 1 tablet (800 mcg total) by mouth daily.   furosemide (LASIX) 20 MG tablet Take 1 tablet (20 mg total) by mouth daily. May take Additional tablet ( 20 mg) if you gain more than 3 pounds in 1 day or more than 5 pounds in 1 week   insulin glargine (LANTUS) 100 UNIT/ML Solostar Pen Inject 31 Units into the skin at bedtime.   Iron,  Ferrous Sulfate, 325 (65 Fe) MG TABS Take 325 mg by mouth daily.   Lancets (ONETOUCH ULTRASOFT) lancets Use as instructed   lisinopril (ZESTRIL) 40 MG tablet Take 1 tablet (40 mg total) by mouth daily.   metFORMIN (GLUCOPHAGE) 1000 MG tablet Take 1 tablet (1,000 mg total) by mouth 2 (two) times daily with a meal.   Nebulizers (COMPRESSOR/NEBULIZER) MISC 1 Units by Does not apply route as needed.   omeprazole (PRILOSEC) 20 MG  capsule Take 1 capsule (20 mg total) by mouth daily.   Tiotropium Bromide Monohydrate (SPIRIVA RESPIMAT) 2.5 MCG/ACT AERS Inhale 2 puffs into the lungs daily.   aspirin EC 81 MG tablet Take 1 tablet (81 mg total) by mouth daily with breakfast. Swallow whole. (Patient not taking: Reported on 12/25/2022)   doxycycline (VIBRAMYCIN) 100 MG capsule Take 1 capsule (100 mg total) by mouth 2 (two) times daily for 3 days. (Patient not taking: Reported on 12/25/2022)   [EXPIRED] cloNIDine (CATAPRES) tablet 0.1 mg    No facility-administered encounter medications on file as of 12/25/2022.     Review of Systems  Review of Systems  Constitutional: Negative.   HENT: Negative.    Cardiovascular: Negative.   Gastrointestinal: Negative.   Allergic/Immunologic: Negative.   Neurological: Negative.   Psychiatric/Behavioral: Negative.         Physical Exam  BP (!) 153/50   Pulse 73   Temp (!) 97.4 F (36.3 C)   Ht 5\' 4"  (1.626 m)   Wt 159 lb 9.6 oz (72.4 kg)   SpO2 99%   BMI 27.40 kg/m   Wt Readings from Last 5 Encounters:  12/25/22 159 lb 9.6 oz (72.4 kg)  12/20/22 163 lb 8 oz (74.2 kg)  12/18/22 165 lb 3.2 oz (74.9 kg)  11/16/22 155 lb 1.6 oz (70.4 kg)  02/27/22 147 lb (66.7 kg)     Physical Exam Vitals and nursing note reviewed.  Constitutional:      General: She is not in acute distress.    Appearance: She is well-developed.  Cardiovascular:     Rate and Rhythm: Normal rate and regular rhythm.  Pulmonary:     Effort: Pulmonary effort is normal.     Breath sounds: Normal breath sounds.  Neurological:     Mental Status: She is alert and oriented to person, place, and time.      Lab Results:  CBC    Component Value Date/Time   WBC 4.0 12/21/2022 0508   RBC 2.92 (L) 12/21/2022 0508   HGB 7.3 (L) 12/21/2022 0508   HGB 7.9 (L) 12/18/2022 1424   HCT 23.8 (L) 12/21/2022 0508   HCT 24.8 (L) 12/18/2022 1424   PLT 251 12/21/2022 0508   PLT 311 12/18/2022 1424   MCV 81.5  12/21/2022 0508   MCV 79 12/18/2022 1424   MCH 25.0 (L) 12/21/2022 0508   MCHC 30.7 12/21/2022 0508   RDW 17.3 (H) 12/21/2022 0508   RDW 16.3 (H) 12/18/2022 1424   LYMPHSABS 0.8 12/20/2022 0528   LYMPHSABS 1.4 11/30/2021 1111   MONOABS 0.3 12/20/2022 0528   EOSABS 0.1 12/20/2022 0528   EOSABS 0.1 11/30/2021 1111   BASOSABS 0.0 12/20/2022 0528   BASOSABS 0.0 11/30/2021 1111    BMET    Component Value Date/Time   NA 137 12/21/2022 0508   NA 142 12/18/2022 1424   K 3.9 12/21/2022 0508   CL 104 12/21/2022 0508   CO2 24 12/21/2022 0508   GLUCOSE 251 (H) 12/21/2022 [id number]  BUN 13 12/21/2022 0508   BUN 12 12/18/2022 1424   CREATININE 0.74 12/21/2022 0508   CREATININE 0.62 03/07/2017 0918   CALCIUM 8.8 (L) 12/21/2022 0508   GFRNONAA >60 12/21/2022 0508   GFRNONAA >89 03/07/2017 0918   GFRAA 124 04/21/2019 1204   GFRAA >89 03/07/2017 0918    BNP    Component Value Date/Time   BNP 301.0 (H) 12/19/2022 1410    ProBNP No results found for: "PROBNP"  Imaging: CT Angio Chest PE W and/or Wo Contrast  Result Date: 12/19/2022 CLINICAL DATA:  High probability for PE.  Shortness of breath. EXAM: CT ANGIOGRAPHY CHEST WITH CONTRAST TECHNIQUE: Multidetector CT imaging of the chest was performed using the standard protocol during bolus administration of intravenous contrast. Multiplanar CT image reconstructions and MIPs were obtained to evaluate the vascular anatomy. RADIATION DOSE REDUCTION: This exam was performed according to the departmental dose-optimization program which includes automated exposure control, adjustment of the mA and/or kV according to patient size and/or use of iterative reconstruction technique. CONTRAST:  48mL OMNIPAQUE IOHEXOL 350 MG/ML SOLN COMPARISON:  Chest x-ray 12/19/2022. FINDINGS: Cardiovascular: Satisfactory opacification of the pulmonary arteries to the segmental level. No evidence of pulmonary embolism. Normal heart size. No pericardial effusion.  Mediastinum/Nodes: There is an enlarged subcarinal lymph node measuring 13 mm. There are nonenlarged bilateral hilar and paratracheal as well as prevascular lymph nodes. The visualized thyroid gland and esophagus are within normal limits. Lungs/Pleura: There are small bilateral pleural effusions, right greater than left. There are multifocal patchy airspace opacities in the bilateral lower lobes, right middle lobe and lingula. These are predominantly seen peripherally. There is minimal scarring in the lung apices. No evidence for pneumothorax. There are minimal secretions in the trachea. Upper Abdomen: No acute abnormality. Musculoskeletal: No chest wall abnormality. No acute or significant osseous findings. Review of the MIP images confirms the above findings. IMPRESSION: 1. No evidence for pulmonary embolism. 2. Small bilateral pleural effusions, right greater than left. 3. Multifocal patchy airspace opacities in the bilateral lower lobes, right middle lobe and lingula worrisome for multifocal pneumonia. 4. Enlarged subcarinal lymph node, likely reactive. 5. Minimal secretions in the trachea. Electronically Signed   By: Ronney Asters M.D.   On: 12/19/2022 17:47   DG Chest 2 View  Result Date: 12/19/2022 CLINICAL DATA:  Shortness of breath EXAM: CHEST - 2 VIEW COMPARISON:  Chest x-ray dated November 14, 2018 FINDINGS: Cardiac and mediastinal contours are unchanged. Worsened patchy opacities of the left lung base with new nodular opacity of the right lower lobe, given interval worsening. Similar mild background diffuse interstitial opacities. New trace bilateral pleural effusions. No evidence of pneumothorax. IMPRESSION: 1. Worsened patchy opacities of the left lung base with new nodular opacity of the right lower lobe, given interval worsening, chest CT is recommended for further evaluation. 2. Mild diffuse interstitial opacities, similar to prior and likely due to pulmonary edema. 3. New trace bilateral pleural  effusions. Electronically Signed   By: Yetta Glassman M.D.   On: 12/19/2022 15:57     Assessment & Plan:   Colon cancer screening - Cologuard  2. Essential hypertension  - cloNIDine (CATAPRES) tablet 0.1 mg - CBC - Comprehensive metabolic panel - Iron, TIBC and Ferritin Panel - AMB Referral to Pharmacy Medication Management  3. Anemia, unspecified type  - CBC - Comprehensive metabolic panel - Iron, TIBC and Ferritin Panel - Iron, Ferrous Sulfate, 325 (65 Fe) MG TABS; Take 325 mg by mouth daily.  Dispense: 30  tablet; Refill: 2 - folic acid (FOLVITE) Q000111Q MCG tablet; Take 1 tablet (800 mcg total) by mouth daily.  Dispense: 30 tablet; Refill: 2  Follow up:  Follow up in 1 month     Fenton Foy, NP 12/25/2022

## 2022-12-26 LAB — CBC
Hematocrit: 27.8 % — ABNORMAL LOW (ref 34.0–46.6)
Hemoglobin: 8.6 g/dL — ABNORMAL LOW (ref 11.1–15.9)
MCH: 24.2 pg — ABNORMAL LOW (ref 26.6–33.0)
MCHC: 30.9 g/dL — ABNORMAL LOW (ref 31.5–35.7)
MCV: 78 fL — ABNORMAL LOW (ref 79–97)
Platelets: 303 10*3/uL (ref 150–450)
RBC: 3.56 x10E6/uL — ABNORMAL LOW (ref 3.77–5.28)
RDW: 16.5 % — ABNORMAL HIGH (ref 11.7–15.4)
WBC: 4.8 10*3/uL (ref 3.4–10.8)

## 2022-12-26 LAB — COMPREHENSIVE METABOLIC PANEL
ALT: 13 IU/L (ref 0–32)
AST: 11 IU/L (ref 0–40)
Albumin/Globulin Ratio: 1.4 (ref 1.2–2.2)
Albumin: 3.4 g/dL — ABNORMAL LOW (ref 3.8–4.9)
Alkaline Phosphatase: 99 IU/L (ref 44–121)
BUN/Creatinine Ratio: 14 (ref 9–23)
BUN: 10 mg/dL (ref 6–24)
Bilirubin Total: <0.2 mg/dL (ref 0.0–1.2)
CO2: 22 mmol/L (ref 20–29)
Calcium: 8.9 mg/dL (ref 8.7–10.2)
Chloride: 104 mmol/L (ref 96–106)
Creatinine, Ser: 0.7 mg/dL (ref 0.57–1.00)
Globulin, Total: 2.4 g/dL (ref 1.5–4.5)
Glucose: 93 mg/dL (ref 70–99)
Potassium: 3.9 mmol/L (ref 3.5–5.2)
Sodium: 140 mmol/L (ref 134–144)
Total Protein: 5.8 g/dL — ABNORMAL LOW (ref 6.0–8.5)
eGFR: 103 mL/min/{1.73_m2} (ref >59)

## 2022-12-26 LAB — IRON,TIBC AND FERRITIN PANEL
Ferritin: 25 ng/mL (ref 15–150)
Iron Saturation: 10 % — ABNORMAL LOW (ref 15–55)
Iron: 32 ug/dL (ref 27–159)
Total Iron Binding Capacity: 330 ug/dL (ref 250–450)
UIBC: 298 ug/dL (ref 131–425)

## 2023-01-06 DIAGNOSIS — J42 Unspecified chronic bronchitis: Secondary | ICD-10-CM | POA: Diagnosis not present

## 2023-01-15 ENCOUNTER — Telehealth: Payer: Self-pay | Admitting: Nurse Practitioner

## 2023-01-15 ENCOUNTER — Other Ambulatory Visit: Payer: Self-pay | Admitting: Nurse Practitioner

## 2023-01-15 DIAGNOSIS — R600 Localized edema: Secondary | ICD-10-CM

## 2023-01-15 DIAGNOSIS — R059 Cough, unspecified: Secondary | ICD-10-CM

## 2023-01-15 DIAGNOSIS — R0602 Shortness of breath: Secondary | ICD-10-CM

## 2023-01-15 DIAGNOSIS — Z760 Encounter for issue of repeat prescription: Secondary | ICD-10-CM

## 2023-01-15 DIAGNOSIS — D649 Anemia, unspecified: Secondary | ICD-10-CM

## 2023-01-15 MED ORDER — FUROSEMIDE 20 MG PO TABS: 20.0000 mg | ORAL_TABLET | Freq: Every day | ORAL | 11 refills | Status: DC

## 2023-01-15 MED ORDER — ALBUTEROL SULFATE HFA 108 (90 BASE) MCG/ACT IN AERS: 2.0000 | INHALATION_SPRAY | Freq: Four times a day (QID) | RESPIRATORY_TRACT | 11 refills | Status: DC | PRN

## 2023-01-15 NOTE — Telephone Encounter (Signed)
Sent to [place] with other medications. KH

## 2023-01-15 NOTE — Telephone Encounter (Signed)
Please advise Kh 

## 2023-01-15 NOTE — Telephone Encounter (Signed)
Please advise KH 

## 2023-01-15 NOTE — Telephone Encounter (Signed)
Caller & Relationship to patient: pt   MRN #  [id number]   Call Back Number:   Date of Last Office Visit: 01/15/2023     Date of Next Office Visit: 01/24/2023    Medication(s) to be Refilled: albuterol and furosemide  Preferred Pharmacy: [id number] - [place], [place] - [phone]   ** Please notify patient to allow 48-72 hours to process** **Let patient know to contact pharmacy at the end of the day to make sure medication is ready. ** **If patient has not been seen in a year or longer, book an appointment **Advise to use MyChart for refill requests OR to contact their pharmacy

## 2023-01-16 ENCOUNTER — Other Ambulatory Visit: Payer: Medicaid Other | Admitting: Pharmacist

## 2023-01-17 ENCOUNTER — Ambulatory Visit (INDEPENDENT_AMBULATORY_CARE_PROVIDER_SITE_OTHER): Payer: Medicaid Other | Admitting: Nurse Practitioner

## 2023-01-17 ENCOUNTER — Emergency Department (HOSPITAL_COMMUNITY): Payer: Medicaid Other

## 2023-01-17 ENCOUNTER — Encounter: Payer: Self-pay | Admitting: Nurse Practitioner

## 2023-01-17 ENCOUNTER — Emergency Department (HOSPITAL_COMMUNITY)
Admission: EM | Admit: 2023-01-17 | Discharge: 2023-01-18 | Disposition: A | Discharge status: Home / Self Care | Payer: Medicaid Other | Attending: Student | Admitting: Student

## 2023-01-17 ENCOUNTER — Non-Acute Institutional Stay (HOSPITAL_COMMUNITY)
Admission: RE | Admit: 2023-01-17 | Discharge: 2023-01-17 | Disposition: A | Discharge status: Home / Self Care | Payer: Medicaid Other | Source: Ambulatory Visit | Attending: Internal Medicine | Admitting: Internal Medicine

## 2023-01-17 ENCOUNTER — Encounter (HOSPITAL_COMMUNITY): Payer: Self-pay | Admitting: Emergency Medicine

## 2023-01-17 VITALS — BP 164/75 | HR 88 | Temp 97.2°F | Wt 162.5 lb

## 2023-01-17 DIAGNOSIS — F17290 Nicotine dependence, other tobacco product, uncomplicated: Secondary | ICD-10-CM | POA: Insufficient documentation

## 2023-01-17 DIAGNOSIS — Z7982 Long term (current) use of aspirin: Secondary | ICD-10-CM | POA: Diagnosis not present

## 2023-01-17 DIAGNOSIS — E119 Type 2 diabetes mellitus without complications: Secondary | ICD-10-CM | POA: Insufficient documentation

## 2023-01-17 DIAGNOSIS — I509 Heart failure, unspecified: Secondary | ICD-10-CM | POA: Diagnosis not present

## 2023-01-17 DIAGNOSIS — D72819 Decreased white blood cell count, unspecified: Secondary | ICD-10-CM | POA: Insufficient documentation

## 2023-01-17 DIAGNOSIS — Z7984 Long term (current) use of oral hypoglycemic drugs: Secondary | ICD-10-CM | POA: Diagnosis not present

## 2023-01-17 DIAGNOSIS — J441 Chronic obstructive pulmonary disease with (acute) exacerbation: Secondary | ICD-10-CM | POA: Diagnosis not present

## 2023-01-17 DIAGNOSIS — Z794 Long term (current) use of insulin: Secondary | ICD-10-CM | POA: Insufficient documentation

## 2023-01-17 DIAGNOSIS — R0602 Shortness of breath: Secondary | ICD-10-CM | POA: Diagnosis not present

## 2023-01-17 DIAGNOSIS — I5033 Acute on chronic diastolic (congestive) heart failure: Secondary | ICD-10-CM | POA: Diagnosis not present

## 2023-01-17 DIAGNOSIS — J9811 Atelectasis: Secondary | ICD-10-CM | POA: Diagnosis not present

## 2023-01-17 DIAGNOSIS — Z79899 Other long term (current) drug therapy: Secondary | ICD-10-CM | POA: Diagnosis not present

## 2023-01-17 DIAGNOSIS — J90 Pleural effusion, not elsewhere classified: Secondary | ICD-10-CM | POA: Diagnosis not present

## 2023-01-17 DIAGNOSIS — I5030 Unspecified diastolic (congestive) heart failure: Secondary | ICD-10-CM | POA: Insufficient documentation

## 2023-01-17 DIAGNOSIS — I110 Hypertensive heart disease with heart failure: Secondary | ICD-10-CM | POA: Insufficient documentation

## 2023-01-17 LAB — CBC WITH DIFFERENTIAL/PLATELET
Abs Immature Granulocytes: 0.01 10*3/uL (ref 0.00–0.07)
Basophils Absolute: 0 10*3/uL (ref 0.0–0.1)
Basophils Relative: 1 %
Eosinophils Absolute: 0.1 10*3/uL (ref 0.0–0.5)
Eosinophils Relative: 3 %
HCT: 29.5 % — ABNORMAL LOW (ref 36.0–46.0)
Hemoglobin: 9 g/dL — ABNORMAL LOW (ref 12.0–15.0)
Immature Granulocytes: 0 %
Lymphocytes Relative: 28 %
Lymphs Abs: 0.9 10*3/uL (ref 0.7–4.0)
MCH: 26.2 pg (ref 26.0–34.0)
MCHC: 30.5 g/dL (ref 30.0–36.0)
MCV: 86 fL (ref 80.0–100.0)
Monocytes Absolute: 0.3 10*3/uL (ref 0.1–1.0)
Monocytes Relative: 9 %
Neutro Abs: 1.9 10*3/uL (ref 1.7–7.7)
Neutrophils Relative %: 59 %
Platelets: 208 10*3/uL (ref 150–400)
RBC: 3.43 MIL/uL — ABNORMAL LOW (ref 3.87–5.11)
RDW: 19.9 % — ABNORMAL HIGH (ref 11.5–15.5)
WBC: 3.3 10*3/uL — ABNORMAL LOW (ref 4.0–10.5)
nRBC: 0 % (ref 0.0–0.2)

## 2023-01-17 LAB — BRAIN NATRIURETIC PEPTIDE: B Natriuretic Peptide: 609.2 pg/mL — ABNORMAL HIGH (ref 0.0–100.0)

## 2023-01-17 LAB — COMPREHENSIVE METABOLIC PANEL
ALT: 11 U/L (ref 0–44)
AST: 14 U/L — ABNORMAL LOW (ref 15–41)
Albumin: 2.5 g/dL — ABNORMAL LOW (ref 3.5–5.0)
Alkaline Phosphatase: 66 U/L (ref 38–126)
Anion gap: 10 (ref 5–15)
BUN: 11 mg/dL (ref 6–20)
CO2: 26 mmol/L (ref 22–32)
Calcium: 8.4 mg/dL — ABNORMAL LOW (ref 8.9–10.3)
Chloride: 103 mmol/L (ref 98–111)
Creatinine, Ser: 1.01 mg/dL — ABNORMAL HIGH (ref 0.44–1.00)
GFR, Estimated: >60 mL/min (ref >60)
Glucose, Bld: 137 mg/dL — ABNORMAL HIGH (ref 70–99)
Potassium: 3.4 mmol/L — ABNORMAL LOW (ref 3.5–5.1)
Sodium: 139 mmol/L (ref 135–145)
Total Bilirubin: 0.5 mg/dL (ref 0.3–1.2)
Total Protein: 5.8 g/dL — ABNORMAL LOW (ref 6.5–8.1)

## 2023-01-17 MED ORDER — DOXYCYCLINE HYCLATE 100 MG PO TABS: 100.0000 mg | ORAL_TABLET | Freq: Two times a day (BID) | ORAL | 0 refills | Status: DC

## 2023-01-17 MED ORDER — PREDNISONE 20 MG PO TABS: 20.0000 mg | ORAL_TABLET | Freq: Every day | ORAL | 0 refills | Status: AC

## 2023-01-17 NOTE — Progress Notes (Signed)
Labs drawn by phlebotomist and taken down to [facility].

## 2023-01-17 NOTE — Patient Instructions (Signed)
1. Shortness of breath  - predniSONE (DELTASONE) 20 MG tablet; Take 1 tablet (20 mg total) by mouth daily with breakfast for 5 days.  Dispense: 5 tablet; Refill: 0 - doxycycline (VIBRA-TABS) 100 MG tablet; Take 1 tablet (100 mg total) by mouth 2 (two) times daily for 10 days.  Dispense: 20 tablet; Refill: 0 - Ambulatory referral to Cardiology  2. Congestive heart failure, unspecified HF chronicity, unspecified heart failure type  - Ambulatory referral to Cardiology  Follow up:  Follow up in 3 months - please go to the ED if symptoms worsen

## 2023-01-17 NOTE — Progress Notes (Signed)
@Patient  ID: Misty Mccullough, female    DOB: [birth year], 52 y.o.   MRN: [id number]  Chief Complaint  Patient presents with   Shortness of Breath    At night and fulid build up    Referring provider: Ivonne Andrew, NP   HPI  Patient presents today for an acute visit.  She states that she has been short of breath for the last few days.  She is currently taking 40 mg of Lasix daily.  We discussed that she can take 1 extra Lasix which will be 20 mg daily over the weekend.  Patient did recently have pneumonia.  We will treat her with antibiotic today.  We are checking stat labs.  To check CBC CMP and BNP.  Patient does have appointment with hematology on Monday due to recent anemia. Denies f/c/s, n/v/d, hemoptysis, PND, leg swelling Denies chest pain or edema        No Known Allergies  Immunization History  Administered Date(s) Administered   Influenza Whole 06/21/2011   Influenza,inj,Quad PF,6+ Mos 09/06/2016, 08/17/2018, 08/25/2019, 06/29/2021, 11/15/2022   PFIZER Comirnaty(Gray Top)Covid-19 Tri-Sucrose Vaccine 01/15/2020, 02/09/2020   Pneumococcal Conjugate-13 08/25/2019   Pneumococcal Polysaccharide-23 12/06/2016   Pneumococcal-Unspecified 06/21/2011   Tdap 12/06/2016    Past Medical History:  Diagnosis Date   Bilateral lower extremity edema    Cough    Diabetes mellitus without complication (HCC)    Gallstones    Heavy cigarette smoker    Hemoglobin A1C between 7% and 9% indicating borderline diabetic control (HCC) 08/2019   Hypercholesteremia    Hyperglycemia    Hypertension    Shortness of breath    Vitamin D deficiency 12/2020    Tobacco History: Social History   Tobacco Use  Smoking Status Every Day   Packs/day: 1.00   Years: 32.00   Additional pack years: 0.00   Total pack years: 32.00   Types: Cigarettes  Smokeless Tobacco Never  Tobacco Comments   Since age 57.   Ready to quit: Not Answered Counseling given: Not Answered Tobacco comments:  Since age 66.   Outpatient Encounter Medications as of 01/17/2023  Medication Sig   albuterol (PROVENTIL) (2.5 MG/3ML) 0.083% nebulizer solution Take 3 mLs (2.5 mg total) by nebulization every 4 (four) hours as needed for wheezing or shortness of breath.   albuterol (VENTOLIN HFA) 108 (90 Base) MCG/ACT inhaler Inhale 2 puffs into the lungs every 6 (six) hours as needed for wheezing or shortness of breath.   aspirin EC 81 MG tablet Take 1 tablet (81 mg total) by mouth daily with breakfast. Swallow whole.   atorvastatin (LIPITOR) 10 MG tablet TAKE 1 TABLET (10 MG TOTAL) BY MOUTH DAILY.   blood glucose meter kit and supplies KIT Dispense based on patient and insurance preference. Use up to four times daily as directed.   fluticasone-salmeterol (ADVAIR) 100-50 MCG/ACT AEPB Inhale 1 puff into the lungs 2 (two) times daily.   folic acid (FOLVITE) 800 MCG tablet Take 1 tablet (800 mcg total) by mouth daily.   furosemide (LASIX) 20 MG tablet Take 1 tablet (20 mg total) by mouth daily. May take Additional tablet ( 20 mg) if you gain more than 3 pounds in 1 day or more than 5 pounds in 1 week   insulin glargine (LANTUS) 100 UNIT/ML Solostar Pen Inject 31 Units into the skin at bedtime.   Lancets (ONETOUCH ULTRASOFT) lancets Use as instructed   lisinopril (ZESTRIL) 40 MG tablet Take 1 tablet (40 mg total)  by mouth daily.   metFORMIN (GLUCOPHAGE) 1000 MG tablet Take 1 tablet (1,000 mg total) by mouth 2 (two) times daily with a meal.   Nebulizers (COMPRESSOR/NEBULIZER) MISC 1 Units by Does not apply route as needed.   omeprazole (PRILOSEC) 20 MG capsule Take 1 capsule (20 mg total) by mouth daily.   SV IRON 325 (65 Fe) MG tablet Take 1 tablet by mouth once daily   Tiotropium Bromide Monohydrate (SPIRIVA RESPIMAT) 2.5 MCG/ACT AERS Inhale 2 puffs into the lungs daily.   No facility-administered encounter medications on file as of 01/17/2023.     Review of Systems  Review of Systems  Constitutional:  Negative.   HENT: Negative.    Cardiovascular: Negative.   Gastrointestinal: Negative.   Allergic/Immunologic: Negative.   Neurological: Negative.   Psychiatric/Behavioral: Negative.         Physical Exam  BP (!) 201/67   Pulse 88   Temp (!) 97.2 F (36.2 C)   Wt 162 lb 8 oz (73.7 kg)   SpO2 99%   BMI 27.89 kg/m   Wt Readings from Last 5 Encounters:  01/17/23 162 lb 8 oz (73.7 kg)  12/25/22 159 lb 9.6 oz (72.4 kg)  12/20/22 163 lb 8 oz (74.2 kg)  12/18/22 165 lb 3.2 oz (74.9 kg)  11/16/22 155 lb 1.6 oz (70.4 kg)     Physical Exam Vitals and nursing note reviewed.  Constitutional:      General: She is not in acute distress.    Appearance: She is well-developed.  Cardiovascular:     Rate and Rhythm: Normal rate and regular rhythm.  Pulmonary:     Effort: Pulmonary effort is normal.     Breath sounds: Normal breath sounds.  Neurological:     Mental Status: She is alert and oriented to person, place, and time.      Lab Results:  CBC    Component Value Date/Time   WBC 4.8 12/25/2022 1120   WBC 4.0 12/21/2022 0508   RBC 3.56 (L) 12/25/2022 1120   RBC 2.92 (L) 12/21/2022 0508   HGB 8.6 (L) 12/25/2022 1120   HCT 27.8 (L) 12/25/2022 1120   PLT 303 12/25/2022 1120   MCV 78 (L) 12/25/2022 1120   MCH 24.2 (L) 12/25/2022 1120   MCH 25.0 (L) 12/21/2022 0508   MCHC 30.9 (L) 12/25/2022 1120   MCHC 30.7 12/21/2022 0508   RDW 16.5 (H) 12/25/2022 1120   LYMPHSABS 0.8 12/20/2022 0528   LYMPHSABS 1.4 11/30/2021 1111   MONOABS 0.3 12/20/2022 0528   EOSABS 0.1 12/20/2022 0528   EOSABS 0.1 11/30/2021 1111   BASOSABS 0.0 12/20/2022 0528   BASOSABS 0.0 11/30/2021 1111    BMET    Component Value Date/Time   NA 140 12/25/2022 1120   K 3.9 12/25/2022 1120   CL 104 12/25/2022 1120   CO2 22 12/25/2022 1120   GLUCOSE 93 12/25/2022 1120   GLUCOSE 251 (H) 12/21/2022 0508   BUN 10 12/25/2022 1120   CREATININE 0.70 12/25/2022 1120   CREATININE 0.62 03/07/2017 0918    CALCIUM 8.9 12/25/2022 1120   GFRNONAA >60 12/21/2022 0508   GFRNONAA >89 03/07/2017 0918   GFRAA 124 04/21/2019 1204   GFRAA >89 03/07/2017 0918    BNP    Component Value Date/Time   BNP 301.0 (H) 12/19/2022 1410    ProBNP No results found for: "PROBNP"  Imaging: CT Angio Chest PE W and/or Wo Contrast  Result Date: 12/19/2022 CLINICAL DATA:  High probability  for PE.  Shortness of breath. EXAM: CT ANGIOGRAPHY CHEST WITH CONTRAST TECHNIQUE: Multidetector CT imaging of the chest was performed using the standard protocol during bolus administration of intravenous contrast. Multiplanar CT image reconstructions and MIPs were obtained to evaluate the vascular anatomy. RADIATION DOSE REDUCTION: This exam was performed according to the departmental dose-optimization program which includes automated exposure control, adjustment of the mA and/or kV according to patient size and/or use of iterative reconstruction technique. CONTRAST:  75mL OMNIPAQUE IOHEXOL 350 MG/ML SOLN COMPARISON:  Chest x-ray 12/19/2022. FINDINGS: Cardiovascular: Satisfactory opacification of the pulmonary arteries to the segmental level. No evidence of pulmonary embolism. Normal heart size. No pericardial effusion. Mediastinum/Nodes: There is an enlarged subcarinal lymph node measuring 13 mm. There are nonenlarged bilateral hilar and paratracheal as well as prevascular lymph nodes. The visualized thyroid gland and esophagus are within normal limits. Lungs/Pleura: There are small bilateral pleural effusions, right greater than left. There are multifocal patchy airspace opacities in the bilateral lower lobes, right middle lobe and lingula. These are predominantly seen peripherally. There is minimal scarring in the lung apices. No evidence for pneumothorax. There are minimal secretions in the trachea. Upper Abdomen: No acute abnormality. Musculoskeletal: No chest wall abnormality. No acute or significant osseous findings. Review of the MIP  images confirms the above findings. IMPRESSION: 1. No evidence for pulmonary embolism. 2. Small bilateral pleural effusions, right greater than left. 3. Multifocal patchy airspace opacities in the bilateral lower lobes, right middle lobe and lingula worrisome for multifocal pneumonia. 4. Enlarged subcarinal lymph node, likely reactive. 5. Minimal secretions in the trachea. Electronically Signed   By: Darliss Cheney M.D.   On: 12/19/2022 17:47   DG Chest 2 View  Result Date: 12/19/2022 CLINICAL DATA:  Shortness of breath EXAM: CHEST - 2 VIEW COMPARISON:  Chest x-ray dated November 14, 2018 FINDINGS: Cardiac and mediastinal contours are unchanged. Worsened patchy opacities of the left lung base with new nodular opacity of the right lower lobe, given interval worsening. Similar mild background diffuse interstitial opacities. New trace bilateral pleural effusions. No evidence of pneumothorax. IMPRESSION: 1. Worsened patchy opacities of the left lung base with new nodular opacity of the right lower lobe, given interval worsening, chest CT is recommended for further evaluation. 2. Mild diffuse interstitial opacities, similar to prior and likely due to pulmonary edema. 3. New trace bilateral pleural effusions. Electronically Signed   By: Allegra Lai M.D.   On: 12/19/2022 15:57     Assessment & Plan:   1. Shortness of breath  - predniSONE (DELTASONE) 20 MG tablet; Take 1 tablet (20 mg total) by mouth daily with breakfast for 5 days.  Dispense: 5 tablet; Refill: 0 - doxycycline (VIBRA-TABS) 100 MG tablet; Take 1 tablet (100 mg total) by mouth 2 (two) times daily for 10 days.  Dispense: 20 tablet; Refill: 0 - Ambulatory referral to Cardiology  2. Congestive heart failure, unspecified HF chronicity, unspecified heart failure type  - Ambulatory referral to Cardiology  Follow up:  Follow up in 3 months - please go to the ED if symptoms worsen    Ivonne Andrew, NP 01/17/2023

## 2023-01-17 NOTE — ED Provider Triage Note (Signed)
Emergency Medicine Provider Triage Evaluation Note  Misty Mccullough , a 52 y.o. female  was evaluated in triage.  Patient presenting with an elevated BNP.  Recent diagnosis of heart failure.  Also says she has had some shortness of breath, PND, DOE and leg swelling.  Review of Systems  Positive:  Negative:   Physical Exam  BP (!) 191/73 (BP Location: Right Arm)   Pulse 89   Temp 97.7 F (36.5 C)   Resp 18   SpO2 94%  Gen:   Awake, no distress   Resp:  Normal effort  MSK:   Moves extremities without difficulty  Other:  Bilateral lower extremity pitting edema, normal work of breathing  Medical Decision Making  Medically screening exam initiated at 7:16 PM.  Appropriate orders placed.  Misty Mccullough was informed that the remainder of the evaluation will be completed by another provider, this initial triage assessment does not replace that evaluation, and the importance of remaining in the ED until their evaluation is complete.     Misty Mccullough, [place] 01/17/23 [phone]

## 2023-01-17 NOTE — ED Triage Notes (Signed)
Pt sleeping on 4 pillows and having trouble sleeping. She is experiencing SOB with exertion. Has appt with cards and pulmonology next week.

## 2023-01-18 ENCOUNTER — Other Ambulatory Visit: Payer: Self-pay

## 2023-01-18 ENCOUNTER — Ambulatory Visit (HOSPITAL_BASED_OUTPATIENT_CLINIC_OR_DEPARTMENT_OTHER)
Admission: RE | Admit: 2023-01-18 | Discharge: 2023-01-18 | Disposition: A | Discharge status: Home / Self Care | Payer: Medicaid Other | Source: Ambulatory Visit | Attending: Nurse Practitioner | Admitting: Nurse Practitioner

## 2023-01-18 DIAGNOSIS — Z1231 Encounter for screening mammogram for malignant neoplasm of breast: Secondary | ICD-10-CM | POA: Insufficient documentation

## 2023-01-18 LAB — COMPREHENSIVE METABOLIC PANEL
ALT: 8 U/L (ref 0–44)
AST: 14 U/L — ABNORMAL LOW (ref 15–41)
Albumin: 2.5 g/dL — ABNORMAL LOW (ref 3.5–5.0)
Alkaline Phosphatase: 67 U/L (ref 38–126)
Anion gap: 13 (ref 5–15)
BUN: 11 mg/dL (ref 6–20)
CO2: 22 mmol/L (ref 22–32)
Calcium: 8.6 mg/dL — ABNORMAL LOW (ref 8.9–10.3)
Chloride: 103 mmol/L (ref 98–111)
Creatinine, Ser: 0.86 mg/dL (ref 0.44–1.00)
GFR, Estimated: >60 mL/min (ref >60)
Glucose, Bld: 174 mg/dL — ABNORMAL HIGH (ref 70–99)
Potassium: 3.4 mmol/L — ABNORMAL LOW (ref 3.5–5.1)
Sodium: 138 mmol/L (ref 135–145)
Total Bilirubin: 0.5 mg/dL (ref 0.3–1.2)
Total Protein: 5.7 g/dL — ABNORMAL LOW (ref 6.5–8.1)

## 2023-01-18 LAB — CBC
HCT: 30.7 % — ABNORMAL LOW (ref 36.0–46.0)
Hemoglobin: 9.3 g/dL — ABNORMAL LOW (ref 12.0–15.0)
MCH: 26.3 pg (ref 26.0–34.0)
MCHC: 30.3 g/dL (ref 30.0–36.0)
MCV: 86.7 fL (ref 80.0–100.0)
Platelets: 189 10*3/uL (ref 150–400)
RBC: 3.54 MIL/uL — ABNORMAL LOW (ref 3.87–5.11)
RDW: 19.8 % — ABNORMAL HIGH (ref 11.5–15.5)
WBC: 2.9 10*3/uL — ABNORMAL LOW (ref 4.0–10.5)
nRBC: 0 % (ref 0.0–0.2)

## 2023-01-18 LAB — TROPONIN I (HIGH SENSITIVITY)
Troponin I (High Sensitivity): 9 ng/L (ref <18)
Troponin I (High Sensitivity): 11 ng/L (ref <18)

## 2023-01-18 LAB — BRAIN NATRIURETIC PEPTIDE: B Natriuretic Peptide: 628.3 pg/mL — ABNORMAL HIGH (ref 0.0–100.0)

## 2023-01-18 MED ORDER — LISINOPRIL 20 MG PO TABS
40.0000 mg | ORAL_TABLET | Freq: Once | ORAL | Status: AC
  Administered 2023-01-18: 40 mg via ORAL
  Filled 2023-01-18: qty 2

## 2023-01-18 MED ORDER — METHYLPREDNISOLONE SODIUM SUCC 125 MG IJ SOLR
125.0000 mg | Freq: Once | INTRAMUSCULAR | Status: AC
  Administered 2023-01-18: 125 mg via INTRAVENOUS
  Filled 2023-01-18: qty 2

## 2023-01-18 MED ORDER — IPRATROPIUM-ALBUTEROL 0.5-2.5 (3) MG/3ML IN SOLN
3.0000 mL | Freq: Once | RESPIRATORY_TRACT | Status: AC
  Administered 2023-01-18: 3 mL via RESPIRATORY_TRACT
  Filled 2023-01-18: qty 3

## 2023-01-18 MED ORDER — FUROSEMIDE 10 MG/ML IJ SOLN
40.0000 mg | Freq: Once | INTRAMUSCULAR | Status: AC
  Administered 2023-01-18: 40 mg via INTRAVENOUS
  Filled 2023-01-18: qty 4

## 2023-01-18 NOTE — Discharge Instructions (Addendum)
Suspect shortness of breath is from COPD as well as some fluid in your lungs, your primary care doctor started you on doxycycline as well as steroids please take as prescribed.  I also want to continue taking a total of 60 mg of Lasix for the next 3 days.  Your primary doctor had referred you to cardiology, you should hear from them next week's time, if you do not hear back from them please call them, I want you to follow-up with her primary doctor on the 19th for reassessment.   Come back to the emergency department if you develop chest pain, shortness of breath, severe abdominal pain, uncontrolled nausea, vomiting, diarrhea.

## 2023-01-18 NOTE — ED Provider Notes (Signed)
[facility] Provider Note   CSN: [id number] Arrival date & time: 01/17/23  1823     History  Chief Complaint  Patient presents with   Shortness of Breath    Misty Mccullough is a 52 y.o. female.  HPI   Patient with medical history including diastolic heart failure, COPD, hypertension, diabetes x 2, current tobacco user, presenting with complaints of shortness of breath.  Patient states that started out 2 days ago, states it happens when her stomach becomes swollen, she states that she has been having a slight cough, which is productive, no associated fevers chills, no actual chest pain, pleuritic chest pain, no associate abdominal pain nausea vomiting, no worsening peripheral edema.  States that she has been taking her Lasix, takes 40 daily, saw her primary doctor and now she is taking 60.  No cardiac history, no history of PEs or DVTs currently not on birth control, no recent surgeries no long immobilization.  Reviewed patient's chart was seen her primary care doctor, diagnosed her with COPD exacerbation, started on doxycycline, steroids, and increase her Lasix.    Home Medications Prior to Admission medications   Medication Sig Start Date End Date Taking? Authorizing Provider  albuterol (PROVENTIL) (2.5 MG/3ML) 0.083% nebulizer solution Take 3 mLs (2.5 mg total) by nebulization every 4 (four) hours as needed for wheezing or shortness of breath. 11/16/22 11/16/23  Shon Hale, MD  albuterol (VENTOLIN HFA) 108 (90 Base) MCG/ACT inhaler Inhale 2 puffs into the lungs every 6 (six) hours as needed for wheezing or shortness of breath. 01/15/23   Ivonne Andrew, NP  aspirin EC 81 MG tablet Take 1 tablet (81 mg total) by mouth daily with breakfast. Swallow whole. 11/17/22   Shon Hale, MD  atorvastatin (LIPITOR) 10 MG tablet TAKE 1 TABLET (10 MG TOTAL) BY MOUTH DAILY. 01/28/22 01/28/23  Orion Crook I, NP  blood glucose meter kit and  supplies KIT Dispense based on patient and insurance preference. Use up to four times daily as directed. 03/08/22   Passmore, Enid Derry I, NP  doxycycline (VIBRA-TABS) 100 MG tablet Take 1 tablet (100 mg total) by mouth 2 (two) times daily for 10 days. 01/17/23 01/27/23  Ivonne Andrew, NP  fluticasone-salmeterol (ADVAIR) 100-50 MCG/ACT AEPB Inhale 1 puff into the lungs 2 (two) times daily. 11/16/22   Shon Hale, MD  folic acid (FOLVITE) 800 MCG tablet Take 1 tablet (800 mcg total) by mouth daily. 12/25/22   Ivonne Andrew, NP  furosemide (LASIX) 20 MG tablet Take 1 tablet (20 mg total) by mouth daily. May take Additional tablet ( 20 mg) if you gain more than 3 pounds in 1 day or more than 5 pounds in 1 week 01/15/23 01/15/24  Ivonne Andrew, NP  insulin glargine (LANTUS) 100 UNIT/ML Solostar Pen Inject 31 Units into the skin at bedtime. 11/16/22 02/14/23  Shon Hale, MD  Lancets (ONETOUCH ULTRASOFT) lancets Use as instructed 03/08/22   Orion Crook I, NP  lisinopril (ZESTRIL) 40 MG tablet Take 1 tablet (40 mg total) by mouth daily. 11/16/22   Shon Hale, MD  metFORMIN (GLUCOPHAGE) 1000 MG tablet Take 1 tablet (1,000 mg total) by mouth 2 (two) times daily with a meal. 11/16/22 11/16/23  Shon Hale, MD  Nebulizers (COMPRESSOR/NEBULIZER) MISC 1 Units by Does not apply route as needed. 11/16/22   Shon Hale, MD  omeprazole (PRILOSEC) 20 MG capsule Take 1 capsule (20 mg total) by mouth daily. 11/16/22 11/16/23  Shon Hale, MD  predniSONE (DELTASONE) 20 MG tablet Take 1 tablet (20 mg total) by mouth daily with breakfast for 5 days. 01/17/23 01/22/23  Ivonne Andrew, NP  SV IRON 325 (65 Fe) MG tablet Take 1 tablet by mouth once daily 01/15/23   Ivonne Andrew, NP  Tiotropium Bromide Monohydrate (SPIRIVA RESPIMAT) 2.5 MCG/ACT AERS Inhale 2 puffs into the lungs daily. 11/16/22   Shon Hale, MD      Allergies    Patient has no known allergies.    Review of Systems   Review of  Systems  Constitutional:  Negative for chills and fever.  Respiratory:  Positive for shortness of breath.   Cardiovascular:  Negative for chest pain.  Gastrointestinal:  Negative for abdominal pain.  Neurological:  Negative for headaches.    Physical Exam Updated Vital Signs BP (!) 177/77   Pulse 90   Temp (!) 97.5 F (36.4 C) (Oral)   Resp (!) 22   Ht  (1.676 m)   Wt 73.7 kg   SpO2 95%   BMI 26.22 kg/m  Physical Exam Vitals and nursing note reviewed.  Constitutional:      General: She is not in acute distress.    Appearance: She is not ill-appearing.  HENT:     Head: Normocephalic and atraumatic.     Nose: No congestion.  Eyes:     Conjunctiva/sclera: Conjunctivae normal.  Cardiovascular:     Rate and Rhythm: Normal rate and regular rhythm.     Pulses: Normal pulses.     Heart sounds: No murmur heard.    No friction rub. No gallop.  Pulmonary:     Effort: No respiratory distress.     Breath sounds: No wheezing, rhonchi or rales.     Comments: Tight sounding without rales rhonchi or stridor present. Abdominal:     Palpations: Abdomen is soft.     Tenderness: There is no abdominal tenderness. There is no right CVA tenderness or left CVA tenderness.  Musculoskeletal:     Right lower leg: No edema.     Left lower leg: No edema.     Comments: No unilateral leg swelling no calf tenderness no palpable cords.  Skin:    General: Skin is warm and dry.  Neurological:     Mental Status: She is alert.  Psychiatric:        Mood and Affect: Mood normal.     ED Results / Procedures / Treatments   Labs (all labs ordered are listed, but only abnormal results are displayed) Labs Reviewed  BRAIN NATRIURETIC PEPTIDE - Abnormal; Notable for the following components:      Result Value   B Natriuretic Peptide 628.3 (*)    All other components within normal limits  CBC - Abnormal; Notable for the following components:   WBC 2.9 (*)    RBC 3.54 (*)    Hemoglobin 9.3 (*)     HCT 30.7 (*)    RDW 19.8 (*)    All other components within normal limits  COMPREHENSIVE METABOLIC PANEL - Abnormal; Notable for the following components:   Potassium 3.4 (*)    Glucose, Bld 174 (*)    Calcium 8.6 (*)    Total Protein 5.7 (*)    Albumin 2.5 (*)    AST 14 (*)    All other components within normal limits  TROPONIN I (HIGH SENSITIVITY)  TROPONIN I (HIGH SENSITIVITY)    EKG EKG Interpretation  Date/Time:  Saturday  January 18 2023 01:34:38 EDT Ventricular Rate:  82 PR Interval:  155 QRS Duration: 93 QT Interval:  428 QTC Calculation: 500 R Axis:   80 Text Interpretation: Sinus rhythm Probable LVH with secondary repol abnrm Borderline prolonged QT interval When compared with ECG of 12/19/2022, No significant change was found Confirmed by Dione Booze ([id number]) on 01/18/2023 1:44:36 AM  Radiology DG Chest 2 View  Result Date: 01/17/2023 CLINICAL DATA:  Shortness of breath EXAM: CHEST - 2 VIEW COMPARISON:  CT chest dated 12/19/2022 FINDINGS: Very mild perihilar edema with small bilateral pleural effusions. Bilateral lower lobe opacities, chronic, favoring post infectious/inflammatory scarring over atelectasis. No pneumothorax. The heart is top-normal in size. Mild degenerative changes of the visualized thoracolumbar spine. IMPRESSION: Very mild perihilar edema with small bilateral pleural effusions. Chronic bilateral lower lobe opacities, favoring post infectious/inflammatory scarring over atelectasis. Electronically Signed   By: Charline Bills M.D.   On: 01/17/2023 19:42    Procedures Procedures    Medications Ordered in ED Medications  ipratropium-albuterol (DUONEB) 0.5-2.5 (3) MG/3ML nebulizer solution 3 mL (3 mLs Nebulization Given 01/18/23 0240)  methylPREDNISolone sodium succinate (SOLU-MEDROL) 125 mg/2 mL injection 125 mg (125 mg Intravenous Given 01/18/23 0240)  furosemide (LASIX) injection 40 mg (40 mg Intravenous Given 01/18/23 0319)  lisinopril (ZESTRIL)  tablet 40 mg (40 mg Oral Given 01/18/23 0404)    ED Course/ Medical Decision Making/ A&P                             Medical Decision Making Risk Prescription drug management.   This patient presents to the ED for concern of shortness of breath, this involves an extensive number of treatment options, and is a complaint that carries with it a high risk of complications and morbidity.  The differential diagnosis includes PE, ACS, CHF, pneumonia, COPD exacerbation    Additional history obtained:  Additional history obtained from family at bedside External records from outside source obtained and reviewed including PCP notes   Co morbidities that complicate the patient evaluation   CHF, COPD  Social Determinants of Health:  Current tobacco user    Lab Tests:  I Ordered, and personally interpreted labs.  The pertinent results include: CBC shows leukocytopenia with white count 2.9, hemoglobin 9.3   Imaging Studies ordered:  I ordered imaging studies including chest x-ray I independently visualized and interpreted imaging which showed very mild perihilar edema with small bilateral effusions I agree with the radiologist interpretation   Cardiac Monitoring:  The patient was maintained on a cardiac monitor.  I personally viewed and interpreted the cardiac monitored which showed an underlying rhythm of: EKG without signs of ischemia   Medicines ordered and prescription drug management:  I ordered medication including bronchodilators, diuretics I have reviewed the patients home medicines and have made adjustments as needed  Critical Interventions:  N/A   Reevaluation:  Presents with shortness of breath, triage obtain basic lab work and imaging, imaging reveals small pleural effusions, patient seems tight sounding, will provide her with bronchodilators, Lasix continue to monitor.  Patient is reassessed, has responded well to Lasix, has been urinating frequently, lung  sounds reassessed after DuoNeb as well as Solu-Medrol, improved, no longer tight sounding, she was ambulated, O2 sat remained above 90, patient is agreement with discharge at this time.    Consultations Obtained:  N/A    Test Considered:  N/A    Rule out I have low suspicion for  ACS as history is atypical, EKG was sinus rhythm without signs of ischemia, patient had negative delta troponin.  Low suspicion for PE as patient denies pleuritic chest pain, vital signs reassuring nontachypneic nonhypoxic no new oxygen requirements, presentation is atypical more consistent with likely COPD as well as CHF exacerbation.  Low suspicion for AAA or aortic dissection as history is atypical, patient has low risk factors.  Low suspicion for systemic infection as patient is nontoxic-appearing, vital signs reassuring, no obvious source infection noted on exam.     Dispostion and problem list  After consideration of the diagnostic results and the patients response to treatment, I feel that the patent would benefit from discharge.  Shortness of breath-suspect multifactorial, COPD exacerbation as well as CHF exacerbation, patient was prescribed steroid taper, started on doxycycline, and increased her Lasix to 60 mg daily, I agree with this current treatment plan will have her continue with this.  Will also have her follow-up with cardiology, primary doctor had referred her to cardiology for further evaluation given her strict return precautions.            Final Clinical Impression(s) / ED Diagnoses Final diagnoses:  COPD exacerbation  Acute congestive heart failure, unspecified heart failure type    Rx / DC Orders ED Discharge Orders     None         Carroll Sage, PA-C 01/18/23 0529    Dione Booze, MD 01/18/23 [phone]

## 2023-01-18 NOTE — ED Notes (Signed)
Patient ambulated per provider orders. Room O2 sat remained around 90-92% while ambulating, and she reported slight shortness of breath following, with no distress noted.

## 2023-01-20 ENCOUNTER — Inpatient Hospital Stay: Payer: Medicaid Other

## 2023-01-20 ENCOUNTER — Inpatient Hospital Stay: Payer: Medicaid Other | Attending: Nurse Practitioner | Admitting: Nurse Practitioner

## 2023-01-20 ENCOUNTER — Encounter: Payer: Self-pay | Admitting: Nurse Practitioner

## 2023-01-20 ENCOUNTER — Other Ambulatory Visit: Payer: Self-pay

## 2023-01-20 ENCOUNTER — Institutional Professional Consult (permissible substitution): Payer: Medicaid Other | Admitting: Internal Medicine

## 2023-01-20 VITALS — BP 184/93 | HR 69 | Temp 98.0°F | Resp 14 | Wt 161.8 lb

## 2023-01-20 DIAGNOSIS — Z8673 Personal history of transient ischemic attack (TIA), and cerebral infarction without residual deficits: Secondary | ICD-10-CM | POA: Diagnosis not present

## 2023-01-20 DIAGNOSIS — D649 Anemia, unspecified: Secondary | ICD-10-CM

## 2023-01-20 DIAGNOSIS — F17210 Nicotine dependence, cigarettes, uncomplicated: Secondary | ICD-10-CM | POA: Diagnosis not present

## 2023-01-20 DIAGNOSIS — I110 Hypertensive heart disease with heart failure: Secondary | ICD-10-CM | POA: Insufficient documentation

## 2023-01-20 DIAGNOSIS — J449 Chronic obstructive pulmonary disease, unspecified: Secondary | ICD-10-CM | POA: Diagnosis not present

## 2023-01-20 DIAGNOSIS — E119 Type 2 diabetes mellitus without complications: Secondary | ICD-10-CM | POA: Diagnosis not present

## 2023-01-20 DIAGNOSIS — I509 Heart failure, unspecified: Secondary | ICD-10-CM | POA: Diagnosis not present

## 2023-01-20 LAB — CMP (CANCER CENTER ONLY)
ALT: 11 U/L (ref 0–44)
AST: 15 U/L (ref 15–41)
Albumin: 3.4 g/dL — ABNORMAL LOW (ref 3.5–5.0)
Alkaline Phosphatase: 74 U/L (ref 38–126)
Anion gap: 10 (ref 5–15)
BUN: 12 mg/dL (ref 6–20)
CO2: 27 mmol/L (ref 22–32)
Calcium: 8.8 mg/dL — ABNORMAL LOW (ref 8.9–10.3)
Chloride: 102 mmol/L (ref 98–111)
Creatinine: 0.89 mg/dL (ref 0.44–1.00)
GFR, Estimated: >60 mL/min (ref >60)
Glucose, Bld: 291 mg/dL — ABNORMAL HIGH (ref 70–99)
Potassium: 3.4 mmol/L — ABNORMAL LOW (ref 3.5–5.1)
Sodium: 139 mmol/L (ref 135–145)
Total Bilirubin: 0.3 mg/dL (ref 0.3–1.2)
Total Protein: 6.8 g/dL (ref 6.5–8.1)

## 2023-01-20 LAB — CBC WITH DIFFERENTIAL (CANCER CENTER ONLY)
Abs Immature Granulocytes: 0.02 10*3/uL (ref 0.00–0.07)
Basophils Absolute: 0 10*3/uL (ref 0.0–0.1)
Basophils Relative: 1 %
Eosinophils Absolute: 0 10*3/uL (ref 0.0–0.5)
Eosinophils Relative: 1 %
HCT: 31.8 % — ABNORMAL LOW (ref 36.0–46.0)
Hemoglobin: 9.9 g/dL — ABNORMAL LOW (ref 12.0–15.0)
Immature Granulocytes: 1 %
Lymphocytes Relative: 13 %
Lymphs Abs: 0.5 10*3/uL — ABNORMAL LOW (ref 0.7–4.0)
MCH: 26.3 pg (ref 26.0–34.0)
MCHC: 31.1 g/dL (ref 30.0–36.0)
MCV: 84.4 fL (ref 80.0–100.0)
Monocytes Absolute: 0.1 10*3/uL (ref 0.1–1.0)
Monocytes Relative: 2 %
Neutro Abs: 3.4 10*3/uL (ref 1.7–7.7)
Neutrophils Relative %: 82 %
Platelet Count: 201 10*3/uL (ref 150–400)
RBC: 3.77 MIL/uL — ABNORMAL LOW (ref 3.87–5.11)
RDW: 19 % — ABNORMAL HIGH (ref 11.5–15.5)
WBC Count: 4.1 10*3/uL (ref 4.0–10.5)
nRBC: 0 % (ref 0.0–0.2)

## 2023-01-20 LAB — IRON AND IRON BINDING CAPACITY (CC-WL,HP ONLY)
Iron: 46 ug/dL (ref 28–170)
Saturation Ratios: 12 % (ref 10.4–31.8)
TIBC: 372 ug/dL (ref 250–450)
UIBC: 326 ug/dL (ref 148–442)

## 2023-01-20 LAB — VITAMIN B12: Vitamin B-12: 137 pg/mL — ABNORMAL LOW (ref 180–914)

## 2023-01-20 LAB — FERRITIN: Ferritin: 17 ng/mL (ref 11–307)

## 2023-01-20 NOTE — Progress Notes (Addendum)
[facility]   [phone] Fax:([phone]   Clinic New consult Note   Patient Care Team: Ivonne Andrew, NP as PCP - General (Pulmonary Disease) 01/20/2023  CHIEF COMPLAINTS/PURPOSE OF CONSULTATION:  Anemia, referred by PCP Angus Seller, NP  HISTORY OF PRESENTING ILLNESS:  Misty Mccullough 52 y.o. female with PMH including DM (A1c 7.8), HTN, HL, CVA (12/15/2017) on aspirin, COPD, CHF is here because of anemia. She had normal CBC in Epic in 2017, 2018, and 12/2017. First found to have abnormal CBC 04/21/2019 with hgb 11.5 which had slightly worsened to 11.0 on 01/01/21; no other cytopenias at the time. B12 was 318. By 06/29/21 Hgb normalized, but by 11/30/21 she had recurrent anemia Hgb 10.8. She was admitted 11/14/22 for CHF exacerbation, lowest Hgb 8.1 and WBC 3.4. She had outpatient f/up 12/21/22 where Hgb was reportedly 7.0 and sent to ED, where Hgb was 8.1. CTA negative for PE, but showed R> L pleural effusions and multifocal patchy opacities in bilateral lower lobes, worrisome for multifocal PNA, and an enlarged likely reactive subcarinal LN. She was admitted for pneumonia and acute respiratory failure with hypoxia and COPD exacerbation. Hgb dropped to 7.3 during the admission. Outpatient f/up 3/20 Hgb improved to 8.6, iron panel showed 10% saturation and ferritin 25 otherwise normal. She was started on oral iron and folic acid. She returned back to ED 01/17/23 for worsening SOB. Work up showed WBC 2.9, hgb 9.3, BNP >600, normal troponins, and CXR with chronic opacities favoring post infectious/inflammatory scarring and stable pleural effusions. She was sent home with cardiology and pulm referrals.   Socially, she is separated, works the morning shift at [employer].  Lives in [place], drives, independent with ADLs.  She has 4 daughters and 1 son who are healthy to her knowledge.  She denies alcohol or drug use, currently smokes 1 pack of cigarettes per day, formally smoked 2  packs/day x 15 years.  Family history is significant for mother who had some kind of GYN malignancy and an uncle had bone cancer.  Patient is up-to-date on mammogram, but no recent pelvic/Pap and never had a colonoscopy.  She is open to cancer screenings.   Today she presents by herself.  Just started another round of prednisone and antibiotics this morning.  Bowels are more like diarrhea lately she feels this is diet related.  She always has intermittent sharp pelvic pains and tends to retain fluid in her pelvic area.  Denies vaginal or rectal bleeding.  Denies unintentional weight loss or significant fatigue. She has dyspnea and leg pain on exertion since her stroke.    MEDICAL HISTORY:  Past Medical History:  Diagnosis Date   Bilateral lower extremity edema    Cough    Diabetes mellitus without complication    Gallstones    Heavy cigarette smoker    Hemoglobin A1C between 7% and 9% indicating borderline diabetic control 08/2019   Hypercholesteremia    Hyperglycemia    Hypertension    Shortness of breath    Vitamin D deficiency 12/2020    SURGICAL HISTORY: Past Surgical History:  Procedure Laterality Date   TUBAL LIGATION      SOCIAL HISTORY: Social History   Socioeconomic History   Marital status: Legally Separated    Spouse name: Not on file   Number of children: Not on file   Years of education: Not on file   Highest education level: Not on file  Occupational History   Not on file  Tobacco Use   Smoking status: Every Day    Packs/day: 1.00    Years: 32.00    Additional pack years: 0.00    Total pack years: 32.00    Types: Cigarettes   Smokeless tobacco: Never   Tobacco comments:    Since age 2.  Vaping Use   Vaping Use: Never used  Substance and Sexual Activity   Alcohol use: Not Currently    Comment: Occasional   Drug use: No   Sexual activity: Yes    Partners: Male  Other Topics Concern   Not on file  Social History Narrative   Not on file    Social Determinants of Health   Financial Resource Strain: Not on file  Food Insecurity: No Food Insecurity (12/20/2022)   Hunger Vital Sign    Worried About Running Out of Food in the Last Year: Never true    Ran Out of Food in the Last Year: Never true  Transportation Needs: No Transportation Needs (12/20/2022)   PRAPARE - Administrator, Civil Service (Medical): No    Lack of Transportation (Non-Medical): No  Physical Activity: Not on file  Stress: Not on file  Social Connections: Not on file  Intimate Partner Violence: Not At Risk (12/20/2022)   Humiliation, Afraid, Rape, and Kick questionnaire    Fear of Current or Ex-Partner: No    Emotionally Abused: No    Physically Abused: No    Sexually Abused: No    FAMILY HISTORY: Family History  Problem Relation Age of Onset   Diabetes Mother    Uterine cancer Mother    Diabetes Father    Heart attack Father 70   Stroke Father    Heart attack Paternal Grandmother    Heart attack Paternal Grandfather    Stroke Sister    Heart attack Paternal Uncle    Bone cancer Paternal Uncle     ALLERGIES:  has No Known Allergies.  MEDICATIONS:  Current Outpatient Medications  Medication Sig Dispense Refill   albuterol (PROVENTIL) (2.5 MG/3ML) 0.083% nebulizer solution Take 3 mLs (2.5 mg total) by nebulization every 4 (four) hours as needed for wheezing or shortness of breath. 75 mL 2   albuterol (VENTOLIN HFA) 108 (90 Base) MCG/ACT inhaler Inhale 2 puffs into the lungs every 6 (six) hours as needed for wheezing or shortness of breath. 1 each 11   aspirin EC 81 MG tablet Take 1 tablet (81 mg total) by mouth daily with breakfast. Swallow whole. 30 tablet 12   atorvastatin (LIPITOR) 10 MG tablet TAKE 1 TABLET (10 MG TOTAL) BY MOUTH DAILY. 30 tablet 11   blood glucose meter kit and supplies KIT Dispense based on patient and insurance preference. Use up to four times daily as directed. 1 each 1   doxycycline (VIBRA-TABS) 100 MG  tablet Take 1 tablet (100 mg total) by mouth 2 (two) times daily for 10 days. 20 tablet 0   fluticasone-salmeterol (ADVAIR) 100-50 MCG/ACT AEPB Inhale 1 puff into the lungs 2 (two) times daily. 60 each 3   folic acid (FOLVITE) 800 MCG tablet Take 1 tablet (800 mcg total) by mouth daily. 30 tablet 2   furosemide (LASIX) 20 MG tablet Take 1 tablet (20 mg total) by mouth daily. May take Additional tablet ( 20 mg) if you gain more than 3 pounds in 1 day or more than 5 pounds in 1 week 45 tablet 11   insulin glargine (LANTUS) 100 UNIT/ML Solostar Pen Inject 31 Units  into the skin at bedtime. 15 mL 2   Lancets (ONETOUCH ULTRASOFT) lancets Use as instructed 100 each 12   lisinopril (ZESTRIL) 40 MG tablet Take 1 tablet (40 mg total) by mouth daily. 30 tablet 3   metFORMIN (GLUCOPHAGE) 1000 MG tablet Take 1 tablet (1,000 mg total) by mouth 2 (two) times daily with a meal. 60 tablet 5   Nebulizers (COMPRESSOR/NEBULIZER) MISC 1 Units by Does not apply route as needed. 1 each 0   omeprazole (PRILOSEC) 20 MG capsule Take 1 capsule (20 mg total) by mouth daily. 30 capsule 3   predniSONE (DELTASONE) 20 MG tablet Take 1 tablet (20 mg total) by mouth daily with breakfast for 5 days. 5 tablet 0   SV IRON 325 (65 Fe) MG tablet Take 1 tablet by mouth once daily 90 tablet 0   Tiotropium Bromide Monohydrate (SPIRIVA RESPIMAT) 2.5 MCG/ACT AERS Inhale 2 puffs into the lungs daily. 4 g 4   No current facility-administered medications for this visit.    REVIEW OF SYSTEMS:   Constitutional: Denies unintentional weight loss, significant fatigue, fevers, chills or abnormal night sweats Eyes: Denies blurriness of vision, double vision or watery eyes Ears, nose, mouth, throat, and face: Denies mucositis or sore throat, or epistaxis Respiratory: Denies cough or wheezes (+) DOE (+) COPD Cardiovascular: Denies palpitation, chest discomfort (+) lower extremity swelling (+) CHF Gastrointestinal:  Denies nausea, vomiting,  constipation, hematochezia, GERD, or change in bowel habits (+) diarrhea (+) pelvic pain Skin: Denies abnormal skin rashes Lymphatics: Denies new lymphadenopathy or easy bruising Neurological:Denies numbness, tingling or new weaknesses (+) history of CVA 2019 with residual left-sided weakness Behavioral/Psych: Mood is stable, no new changes  All other systems were reviewed with the patient and are negative.  PHYSICAL EXAMINATION: ECOG PERFORMANCE STATUS: 1 - Symptomatic but completely ambulatory  Vitals:   01/20/23 1237  BP: (!) 184/93  Pulse: 69  Resp: 14  Temp: 98 F (36.7 C)  SpO2: 98%   Filed Weights   01/20/23 1237  Weight: 161 lb 12.8 oz (73.4 kg)    GENERAL:alert, no distress and comfortable SKIN: skin color, texture, turgor are normal, no rashes or significant lesions EYES:  sclera clear NECK: Without mass LYMPH:  no palpable cervical, supraclavicular, or axillary lymphadenopathy  LUNGS: Decreased breath sounds with normal breathing effort HEART: regular rate & rhythm, mild bilateral lower extremity edema ABDOMEN:abdomen soft, non-tender and normal bowel sounds.  No significant ascites or palpable mass Musculoskeletal:no cyanosis of digits and no clubbing  PSYCH: alert & oriented x 3 with fluent speech NEURO: no focal motor/sensory deficits  LABORATORY DATA:  I have reviewed the data as listed    Latest Ref Rng & Units 01/18/2023    1:29 AM 01/17/2023    3:08 PM 12/25/2022   11:20 AM  CBC  WBC 4.0 - 10.5 K/uL 2.9  3.3  4.8   Hemoglobin 12.0 - 15.0 g/dL 9.3  9.0  8.6   Hematocrit 36.0 - 46.0 % 30.7  29.5  27.8   Platelets 150 - 400 K/uL 189  208  303        Latest Ref Rng & Units 01/18/2023    1:29 AM 01/17/2023    3:08 PM 12/25/2022   11:20 AM  CMP  Glucose 70 - 99 mg/dL 161  096  93   BUN 6 - 20 mg/dL Creatinine 0.44 - 1.00 mg/dL 0.45  4.09  8.11   Sodium 135 -  145 mmol/L 138  139  140   Potassium 3.5 - 5.1 mmol/L 3.4  3.4  3.9   Chloride  98 - 111 mmol/L 103  103  104   CO2 22 - 32 mmol/L Calcium 8.9 - 10.3 mg/dL 8.6  8.4  8.9   Total Protein 6.5 - 8.1 g/dL 5.7  5.8  5.8   Total Bilirubin 0.3 - 1.2 mg/dL 0.5  0.5  <1.6   Alkaline Phos 38 - 126 U/L 67  66  99   AST 15 - 41 U/L ALT 0 - 44 U/L RADIOGRAPHIC STUDIES: I have personally reviewed the radiological images as listed and agreed with the findings in the report. DG Chest 2 View  Result Date: 01/17/2023 CLINICAL DATA:  Shortness of breath EXAM: CHEST - 2 VIEW COMPARISON:  CT chest dated 12/19/2022 FINDINGS: Very mild perihilar edema with small bilateral pleural effusions. Bilateral lower lobe opacities, chronic, favoring post infectious/inflammatory scarring over atelectasis. No pneumothorax. The heart is top-normal in size. Mild degenerative changes of the visualized thoracolumbar spine. IMPRESSION: Very mild perihilar edema with small bilateral pleural effusions. Chronic bilateral lower lobe opacities, favoring post infectious/inflammatory scarring over atelectasis. Electronically Signed   By: Charline Bills M.D.   On: 01/17/2023 19:42    ASSESSMENT & PLAN: 52 year old female  Anemia, unspecified type -We reviewed her medical record in detail with the patient. She developed mild anemia in 2020 that normalized in 2022, and recurred/progressed since 11/2021 hgb 10.8 -She had acute anemia during recent hospitalizations for PNA, COPD and CHF exacerbations, lowest Hgb 7.3 -B12 was normal in 2022; iron studies 12/2022 showed ferritin 25 and 10% saturation. She was started on oral iron and folic acid 12/2022 -We reviewed common etiologies of anemia. Will obtain labs today to r/o nutritional anemias and multiple myeloma. We also discussed she likely has component of anemia of chronic disease and ?recent infection.  -she recently had low protein and albumin, will obtain ABD [place] to r/o liver disease or ascites, or splenomegaly  -She is  agreeable with cancer screenings to r/o occult malignancy and will repeat CT chest in 1 month -If work up is unrevealing, or she has persistent anemia despite adequate nutrient supplementation, we may recommend bone marrow biopsy  -For now continue oral iron and folic acid prescribed by PCP. We will call with results and recommendations.  -F/up in 1 month -Pt seen with Dr. Mosetta Putt  COPD, recent PNA and hypoxic respiratory failure -Formerly smoked 2 PPD x15 years, cut back to 1 PPD -Hospitalized 12/2022 for PNA, COPD exacerbation. Does not use supplemental O2 -CTA was negative for PE, but showed R> L pleural effusions, multifocal patchy airspace opacities in bilateral lower lobes, and an enlarged likely reactive subcarinal lymph node -Currently on doxy and prednisone, started this morning 01/20/23.  We recommend to repeat CT chest in 1 month to ensure infection resolution and rule out occult malignancy -She was counseled on smoking cessation but is not interested in quitting -Pulm referral in place, will see Dr. Craige Cotta 4/22   CHF -Recently hospitalized for acute on chronic HF 11/2022, and ED visit for SOB 01/17/23 with BNP >600  -Currently on lasix TID -Cardiology referral in place, will see Dr. Jenene Slicker 4/17   Comorbidities: DM, HTN, HL, CVA -On insulin, metformin, statin, ASA, lisinopril, per PCP -PPI on her med list but  doesn't take it. I discussed if she restarts, will need to take separately from oral iron  Health maintenance and age-appropriate cancer screenings -She is open to getting cancer screenings up to date. Just had mammogram 01/18/23 result is pending -Pelvic/pap smear per PCP -Will refer to Dr. Elnoria Howard or Dr. Loreta Ave for GI/colonoscopy, pt is open to do this and agreeable   PLAN: -Medical record reviewed -Lab today (CBC, CMP, B12, folate, MMA, iron/TIBC, ferritin, Light chains, MM panel), will call with results  -Continue oral iron and folic acid per PCP -ABD [place] in the next week -CT  Chest wo contrast 1 month -Cancer screenings: mammogram complete, referral to GI placed, PCP to do pap please -F/up in 1 month with results and close f/up   Orders Placed This Encounter  Procedures   US Abdomen Complete    Standing Status:   Future    Standing Expiration Date:   01/20/2024    Order Specific Question:   Reason for Exam (SYMPTOM  OR DIAGNOSIS REQUIRED)    Answer:   anemia, abdominal swelling    Order Specific Question:   Preferred imaging location?    Answer:   [facility]   CT Chest Wo Contrast    Standing Status:   Future    Standing Expiration Date:   01/20/2024    Order Specific Question:   Is patient pregnant?    Answer:   No    Order Specific Question:   Preferred imaging location?    Answer:   [facility]   CBC with Differential (Cancer Center Only)    Standing Status:   Future    Number of Occurrences:   1    Standing Expiration Date:   01/20/2024   CMP (Cancer Center only)    Standing Status:   Future    Number of Occurrences:   1    Standing Expiration Date:   01/20/2024   Ferritin    Standing Status:   Future    Number of Occurrences:   1    Standing Expiration Date:   01/20/2024   Iron and Iron Binding Capacity ([facility] only)    Standing Status:   Future    Number of Occurrences:   1    Standing Expiration Date:   01/20/2024   Folate RBC    Standing Status:   Future    Number of Occurrences:   1    Standing Expiration Date:   01/20/2024   Vitamin B12    Standing Status:   Future    Number of Occurrences:   1    Standing Expiration Date:   01/20/2024   Methylmalonic acid, serum    Standing Status:   Future    Number of Occurrences:   1    Standing Expiration Date:   01/20/2024   Kappa/lambda light chains    Standing Status:   Future    Number of Occurrences:   1    Standing Expiration Date:   01/20/2024   Multiple Myeloma Panel (SPEP&IFE w/QIG)    Standing Status:   Future    Number of Occurrences:   1    Standing Expiration  Date:   01/20/2024   Ambulatory referral to Gastroenterology    Referral Priority:   Routine    Referral Type:   Consultation    Referral Reason:   Specialty Services Required    Number of Visits Requested:   1       All  questions were answered. The patient knows to call the clinic with any problems, questions or concerns.      Pollyann Samples, NP 01/20/23   Addendum I have seen the patient, examined her. I agree with the assessment and and plan and have edited the notes.   52 year old female with multiple comorbidities, including DM (A1c 7.8), HTN, HL, CVA (12/15/2017) on aspirin, COPD, CHF is here because of anemia, which started about a year ago, and got worse lately.  I reviewed her previous lab work, which showed mild iron deficiency.  She may have anemia of chronic disease also especially from CHF and diabetes.  She is on oral iron for a month, will repeat labs today, including iron study, B12, folate, reticular counts, SPEP with immunofixation, and light chain level, for her anemia workup.  If she does have iron deficiency, I will likely give her IV iron to see how much her anemia improves.  She also has mild leukopenia, no history of liver disease, or heavy alcohol drinking.  Will obtain ultrasound of the liver and spleen.  She had pneumonia a few months ago, CT scan from December 19, 2022 showed small bilateral pleural effusion, multifocal patchy airspace opacities.  She is a heavy smoker, I recommended repeating CT chest without contrast in 3 to 4 weeks.  All questions were answered.  I will see her back in a months after the above lab work and CT scan.  Malachy Mood MD 01/20/2023

## 2023-01-21 LAB — KAPPA/LAMBDA LIGHT CHAINS
Kappa free light chain: 32.2 mg/L — ABNORMAL HIGH (ref 3.3–19.4)
Kappa, lambda light chain ratio: 1.63 (ref 0.26–1.65)
Lambda free light chains: 19.7 mg/L (ref 5.7–26.3)

## 2023-01-21 LAB — FOLATE RBC
Folate, Hemolysate: 461 ng/mL
Folate, RBC: 1463 ng/mL (ref >498)
Hematocrit: 31.5 % — ABNORMAL LOW (ref 34.0–46.6)

## 2023-01-22 ENCOUNTER — Encounter: Payer: Self-pay | Admitting: Internal Medicine

## 2023-01-22 ENCOUNTER — Ambulatory Visit: Payer: Medicaid Other | Attending: Internal Medicine | Admitting: Internal Medicine

## 2023-01-22 VITALS — BP 156/78 | HR 89 | Ht 66.0 in | Wt 158.0 lb

## 2023-01-22 DIAGNOSIS — I739 Peripheral vascular disease, unspecified: Secondary | ICD-10-CM | POA: Insufficient documentation

## 2023-01-22 DIAGNOSIS — R079 Chest pain, unspecified: Secondary | ICD-10-CM

## 2023-01-22 MED ORDER — ISOSORBIDE MONONITRATE ER 30 MG PO TB24: 30.0000 mg | ORAL_TABLET | Freq: Every day | ORAL | 3 refills | Status: DC

## 2023-01-22 MED ORDER — METOPROLOL TARTRATE 25 MG PO TABS: 25.0000 mg | ORAL_TABLET | Freq: Two times a day (BID) | ORAL | 3 refills | Status: DC

## 2023-01-22 MED ORDER — FUROSEMIDE 20 MG PO TABS: 60.0000 mg | ORAL_TABLET | Freq: Every day | ORAL | 3 refills | Status: DC

## 2023-01-22 NOTE — Progress Notes (Signed)
Cardiology Office Note  Date: 01/22/2023   ID: Misty Mccullough, DOB 01/24/1971, MRN [id number]  PCP:  Ivonne Andrew, NP  Cardiologist:  None Electrophysiologist:  None   Reason for Office Visit: Evaluation of CHF at the request of Misty Rockers, NP   History of Present Illness: Misty Mccullough is a 52 y.o. female known to have HTN, DM 2, nicotine abuse, chronic diastolic heart failure, COPD was referred to cardiology clinic for evaluation of congestive heart failure.  Patient works in [employer], prepares biscuits every day.  Wakes up at 4 AM and starts to work.  She started to develop substernal chest tightness/chest pressure x 1 year ago, with exertion (with walking especially), last for a few minutes and resolve spontaneously.  Initially less frequent but in the last few months, the chest tightness has been occurring every day multiple times throughout the day with exertion like walking.  She usually takes a break/rest due to which the chest discomfort resolves completely.  Does not have any sublingual nitroglycerin with her.  She was recently admitted to the hospital in 11/26/2022 and 12/25/2022 with COPD exacerbation and heart failure exacerbation.  She currently smokes 1 pack/day, previously used to smoke 2 packs/day.  She also has bilateral lower leg claudication, cramps in her calfs when she walks.  Patient underwent NM stress test in 2017 due to history of chest pain, there was no evidence of ischemia.  Echocardiogram from 11/15/2022 showed LVEF 55 to 60%, mild LVH, DD G1 DD, RV systolic function is normal, mild MR.  Past Medical History:  Diagnosis Date   Bilateral lower extremity edema    Cough    Diabetes mellitus without complication    Gallstones    Heavy cigarette smoker    Hemoglobin A1C between 7% and 9% indicating borderline diabetic control 08/2019   Hypercholesteremia    Hyperglycemia    Hypertension    Shortness of breath    Vitamin D deficiency 12/2020    Past Surgical  History:  Procedure Laterality Date   TUBAL LIGATION      Current Outpatient Medications  Medication Sig Dispense Refill   albuterol (PROVENTIL) (2.5 MG/3ML) 0.083% nebulizer solution Take 3 mLs (2.5 mg total) by nebulization every 4 (four) hours as needed for wheezing or shortness of breath. 75 mL 2   albuterol (VENTOLIN HFA) 108 (90 Base) MCG/ACT inhaler Inhale 2 puffs into the lungs every 6 (six) hours as needed for wheezing or shortness of breath. 1 each 11   aspirin EC 81 MG tablet Take 1 tablet (81 mg total) by mouth daily with breakfast. Swallow whole. 30 tablet 12   atorvastatin (LIPITOR) 10 MG tablet TAKE 1 TABLET (10 MG TOTAL) BY MOUTH DAILY. 30 tablet 11   blood glucose meter kit and supplies KIT Dispense based on patient and insurance preference. Use up to four times daily as directed. 1 each 1   doxycycline (VIBRA-TABS) 100 MG tablet Take 1 tablet (100 mg total) by mouth 2 (two) times daily for 10 days. 20 tablet 0   fluticasone-salmeterol (ADVAIR) 100-50 MCG/ACT AEPB Inhale 1 puff into the lungs 2 (two) times daily. 60 each 3   folic acid (FOLVITE) 800 MCG tablet Take 1 tablet (800 mcg total) by mouth daily. 30 tablet 2   furosemide (LASIX) 20 MG tablet Take 1 tablet (20 mg total) by mouth daily. May take Additional tablet ( 20 mg) if you gain more than 3 pounds in 1 day or more than 5 pounds  in 1 week 45 tablet 11   insulin glargine (LANTUS) 100 UNIT/ML Solostar Pen Inject 31 Units into the skin at bedtime. 15 mL 2   Lancets (ONETOUCH ULTRASOFT) lancets Use as instructed 100 each 12   lisinopril (ZESTRIL) 40 MG tablet Take 1 tablet (40 mg total) by mouth daily. 30 tablet 3   metFORMIN (GLUCOPHAGE) 1000 MG tablet Take 1 tablet (1,000 mg total) by mouth 2 (two) times daily with a meal. 60 tablet 5   Nebulizers (COMPRESSOR/NEBULIZER) MISC 1 Units by Does not apply route as needed. 1 each 0   omeprazole (PRILOSEC) 20 MG capsule Take 1 capsule (20 mg total) by mouth daily. 30 capsule 3    predniSONE (DELTASONE) 20 MG tablet Take 1 tablet (20 mg total) by mouth daily with breakfast for 5 days. 5 tablet 0   SV IRON 325 (65 Fe) MG tablet Take 1 tablet by mouth once daily 90 tablet 0   Tiotropium Bromide Monohydrate (SPIRIVA RESPIMAT) 2.5 MCG/ACT AERS Inhale 2 puffs into the lungs daily. 4 g 4   No current facility-administered medications for this visit.   Allergies:  Patient has no known allergies.   Social History: The patient  reports that she has been smoking cigarettes. She has a 32.00 pack-year smoking history. She has never used smokeless tobacco. She reports that she does not currently use alcohol. She reports that she does not use drugs.   Family History: The patient's family history includes Bone cancer in her paternal uncle; Diabetes in her father and mother; Heart attack in her paternal grandfather, paternal grandmother, and paternal uncle; Heart attack (age of onset: 62) in her father; Stroke in her father and sister; Uterine cancer in her mother.   ROS:  Please see the history of present illness. Otherwise, complete review of systems is positive for none  All other systems are reviewed and negative.   Physical Exam: VS:  There were no vitals taken for this visit., BMI There is no height or weight on file to calculate BMI.  Wt Readings from Last 3 Encounters:  01/20/23 161 lb 12.8 oz (73.4 kg)  01/18/23 162 lb 7.7 oz (73.7 kg)  01/17/23 162 lb 8 oz (73.7 kg)    General: Patient appears comfortable at rest. HEENT: Conjunctiva and lids normal, oropharynx clear with moist mucosa. Neck: Supple, no elevated JVP or carotid bruits, no thyromegaly. Lungs: Clear to auscultation, nonlabored breathing at rest. Cardiac: Regular rate and rhythm, no S3 or significant systolic murmur, no pericardial rub. Abdomen: Soft, nontender, no hepatomegaly, bowel sounds present, no guarding or rebound. Extremities: No pitting edema Skin: Warm and dry. Musculoskeletal: No  kyphosis. Neuropsychiatric: Alert and oriented x3, affect grossly appropriate.  Recent Labwork: 12/21/2022: Magnesium 2.1 01/18/2023: B Natriuretic Peptide 628.3 01/20/2023: ALT 11; AST 15; BUN 12; Creatinine 0.89; Hemoglobin 9.9; Platelet Count 201; Potassium 3.4; Sodium 139     Component Value Date/Time   CHOL 234 (H) 12/18/2022 1424   TRIG 62 12/18/2022 1424   HDL 57 12/18/2022 1424   CHOLHDL 4.1 12/18/2022 1424   CHOLHDL 5.8 12/16/2017 0348   VLDL 17 12/16/2017 0348   LDLCALC 167 (H) 12/18/2022 1424    Other Studies Reviewed Today: Echocardiogram on 11/15/2022 LVEF 55 to 60% G1 DD RV systolic function is normal Mild MR  NM stress test in 2017 Horizontal ST segment depression ST segment depression of 0.5 mm was noted during stress in the II, III, aVF, V5 and V6 leads. No T wave inversion  was noted during stress. There is a small defect of mild severity present in the apical anterior and apical septal location. The defect is non-reversible and consistent with breast attenuation artifact. This is a low risk study. The left ventricular ejection fraction is normal (55-65%). Nuclear stress EF: 60%.  Assessment and Plan: Patient is a 52 year old F known to have HTN, DM 2, nicotine abuse, COPD, chronic diastolic heart failure was referred to cardiology clinic for evaluation of CHF.  # Cardiac chest pain: Patient had substernal exertional chest tightness/chest pressure that started 1 year ago initially less frequent but in the last few months, occurring every day and multiple times throughout the day.  Last for a few minutes and resolves with rest.  Start Imdur 30 mg once daily and metoprolol tartrate 25 mg once daily. Obtain CTA cardiac to rule out significant CAD. In addition, she had a few admissions in the last few months for diastolic heart failure exacerbation which could be an anginal equivalent. Will follow-up on the CTA cardiac.  Will continue aspirin 81 mg once daily and  atorvastatin 10 mg nightly for now. # Chronic diastolic heart failure: Patient currently is taking p.o. Lasix 20 mg 3 times a day, will change the dosing regimen to p.o. Lasix 60 mg once daily. # HTN: Continue lisinopril 40 mg once daily, Lasix 60 mg once daily and start metoprolol tartrate 25 mg once daily along with Imdur 30 mg once daily. # HLD: Continue atorvastatin 10 mg at bedtime, goal LDL less than 70. # History of CVA: Continue aspirin 81 mg once daily and atorvastatin 10 mg nightly.,  Goal LDL less than 70. # Bilateral lower extremity claudication: Obtain ABI with ultrasound arterial Doppler lower extremities. # Nicotine abuse: Patient cut down smoking to 1 pack/day (previously was smoking 2 packs/day).  Smoking cessation counseling provided, cutting down cigarettes. Smoking cessation instruction/counseling given:  counseled patient on the dangers of tobacco use, advised patient to stop smoking, and reviewed strategies to maximize success   I have spent a total of 45 minutes with patient reviewing chart, EKGs, labs and examining patient as well as establishing an assessment and plan that was discussed with the patient.  > 50% of time was spent in direct patient care.    Medication Adjustments/Labs and Tests Ordered: Current medicines are reviewed at length with the patient today.  Concerns regarding medicines are outlined above.   Tests Ordered: No orders of the defined types were placed in this encounter.   Medication Changes: No orders of the defined types were placed in this encounter.   Disposition:  Follow up  1 month  Signed Monicia Tse Verne Spurr, MD, 01/22/2023 11:20 AM    [facility] at [facility] [address] [place], [place], [place] [address]

## 2023-01-22 NOTE — Patient Instructions (Addendum)
Medication Instructions:  Your physician has recommended you make the following change in your medication:  Change furosemide to 60 mg daily (take three of your 20 mg tablets) Start isosorbide mononitrate 30 mg daily Start metoprolol tartrate 25 mg twice daily Continue other medications the same  Labwork: none  Testing/Procedures: Your physician has requested that you have a lower or upper extremity arterial duplex. This test is an ultrasound of the arteries in the legs or arms. It looks at arterial blood flow in the legs and arms. Allow one hour for Lower and Upper Arterial scans. There are no restrictions or special instructions Coronary CTA-see instructions below  Follow-Up: Your physician recommends that you schedule a follow-up appointment in: 1 month  Any Other Special Instructions Will Be Listed Below (If Applicable).  If you need a refill on your cardiac medications before your next appointment, please call your pharmacy.    Your cardiac CT will be scheduled at one of the below locations:   [facility] [address] [place], [place] [address] [phone]  If scheduled at [facility], please arrive at the [facility] and Children's Entrance (Entrance C2) of [facility] 30 minutes prior to test start time. You can use the FREE valet parking offered at entrance C (encouraged to control the heart rate for the test)  Proceed to the [facility] Radiology Department (first floor) to check-in and test prep.  All radiology patients and guests should use entrance C2 at [facility], accessed from [facility], even though the hospital's physical address listed is [address]    Please follow these instructions carefully (unless otherwise directed):  On the Night Before the Test: Be sure to Drink plenty of water. Do not consume any caffeinated/decaffeinated beverages or chocolate 12 hours prior to your test. Do not take  any antihistamines 12 hours prior to your test. If the patient has contrast allergy: No contrast allergy  On the Day of the Test: Drink plenty of water until 1 hour prior to the test. Do not eat any food 1 hour prior to test. You may take your regular medications prior to the test except furosemide, metformin, lantus Take metoprolol (Lopressor) 100 mg two hours prior to test. If you take Furosemide, please HOLD on the morning of the test. FEMALES- please wear underwire-free bra if available, avoid dresses & tight clothing    After the Test: Drink plenty of water. After receiving IV contrast, you may experience a mild flushed feeling. This is normal. On occasion, you may experience a mild rash up to 24 hours after the test. This is not dangerous. If this occurs, you can take Benadryl 25 mg and increase your fluid intake. If you experience trouble breathing, this can be serious. If it is severe call 911 IMMEDIATELY. If it is mild, please call our office. If you take any of these medications: Metformin, please do not take 48 hours after completing test unless otherwise instructed.  We will call to schedule your test 2-4 weeks out understanding that some insurance companies will need an authorization prior to the service being performed.   For non-scheduling related questions, please contact the cardiac imaging nurse navigator should you have any questions/concerns: Rockwell Alexandria, Cardiac Imaging Nurse Navigator Larey Brick, Cardiac Imaging Nurse Navigator [facility] Direct Office Dial: [phone]   For scheduling needs, including cancellations and rescheduling, please call [place], [phone].

## 2023-01-23 LAB — MULTIPLE MYELOMA PANEL, SERUM
Albumin SerPl Elph-Mcnc: 3 g/dL (ref 2.9–4.4)
Albumin/Glob SerPl: 1 (ref 0.7–1.7)
Alpha 1: 0.3 g/dL (ref 0.0–0.4)
Alpha2 Glob SerPl Elph-Mcnc: 1.1 g/dL — ABNORMAL HIGH (ref 0.4–1.0)
B-Globulin SerPl Elph-Mcnc: 1 g/dL (ref 0.7–1.3)
Gamma Glob SerPl Elph-Mcnc: 0.7 g/dL (ref 0.4–1.8)
Globulin, Total: 3.1 g/dL (ref 2.2–3.9)
IgA: 184 mg/dL (ref 87–352)
IgG (Immunoglobin G), Serum: 515 mg/dL — ABNORMAL LOW (ref 586–1602)
IgM (Immunoglobulin M), Srm: 358 mg/dL — ABNORMAL HIGH (ref 26–217)
Total Protein ELP: 6.1 g/dL (ref 6.0–8.5)

## 2023-01-23 LAB — METHYLMALONIC ACID, SERUM: Methylmalonic Acid, Quantitative: 166 nmol/L (ref 0–378)

## 2023-01-24 ENCOUNTER — Ambulatory Visit (INDEPENDENT_AMBULATORY_CARE_PROVIDER_SITE_OTHER): Payer: Medicaid Other | Admitting: Nurse Practitioner

## 2023-01-24 ENCOUNTER — Encounter: Payer: Self-pay | Admitting: Nurse Practitioner

## 2023-01-24 VITALS — BP 95/45 | HR 82 | Temp 97.0°F | Wt 154.2 lb

## 2023-01-24 DIAGNOSIS — R197 Diarrhea, unspecified: Secondary | ICD-10-CM | POA: Diagnosis not present

## 2023-01-24 MED ORDER — LOPERAMIDE HCL 2 MG PO TABS
2.0000 mg | ORAL_TABLET | Freq: Four times a day (QID) | ORAL | 0 refills | Status: DC | PRN
Start: 2023-01-24 — End: 2023-02-11

## 2023-01-24 NOTE — Progress Notes (Signed)
@Patient  ID: Misty Mccullough, female    DOB: January 10, 1971, 52 y.o.   MRN: [id number]  Chief Complaint  Patient presents with   Follow-up    Labs     Referring provider: Ivonne Andrew, NP   HPI   Patient presents today for follow-up visit.  She was sent to the ED after last visit due to elevated BNP.  She was having shortness of breath.  She was treated with IV Lasix and steroids.  She is feeling better.  She has followed with cardiology since last visit.  She is currently on Lasix 60 mg.  Patient has been having diarrhea.  She is trying to stay well-hydrated.  We will order some as needed Imodium.  Denies f/c/s, n/v/d, hemoptysis, PND, leg swelling Denies chest pain or edema       No Known Allergies  Immunization History  Administered Date(s) Administered   Influenza Whole 06/21/2011   Influenza,inj,Quad PF,6+ Mos 09/06/2016, 08/17/2018, 08/25/2019, 06/29/2021, 11/15/2022   PFIZER Comirnaty(Gray Top)Covid-19 Tri-Sucrose Vaccine 01/15/2020, 02/09/2020   Pneumococcal Conjugate-13 08/25/2019   Pneumococcal Polysaccharide-23 12/06/2016   Pneumococcal-Unspecified 06/21/2011   Tdap 12/06/2016    Past Medical History:  Diagnosis Date   Bilateral lower extremity edema    Cough    Diabetes mellitus without complication    Gallstones    Heavy cigarette smoker    Hemoglobin A1C between 7% and 9% indicating borderline diabetic control 08/2019   Hypercholesteremia    Hyperglycemia    Hypertension    Shortness of breath    Vitamin D deficiency 12/2020    Tobacco History: Social History   Tobacco Use  Smoking Status Every Day   Packs/day: 1.00   Years: 32.00   Additional pack years: 0.00   Total pack years: 32.00   Types: Cigarettes  Smokeless Tobacco Never  Tobacco Comments   Since age 34.   Ready to quit: Not Answered Counseling given: Not Answered Tobacco comments: Since age 35.   Outpatient Encounter Medications as of 01/24/2023  Medication Sig   albuterol  (PROVENTIL) (2.5 MG/3ML) 0.083% nebulizer solution Take 3 mLs (2.5 mg total) by nebulization every 4 (four) hours as needed for wheezing or shortness of breath.   albuterol (VENTOLIN HFA) 108 (90 Base) MCG/ACT inhaler Inhale 2 puffs into the lungs every 6 (six) hours as needed for wheezing or shortness of breath.   aspirin EC 81 MG tablet Take 1 tablet (81 mg total) by mouth daily with breakfast. Swallow whole.   atorvastatin (LIPITOR) 10 MG tablet TAKE 1 TABLET (10 MG TOTAL) BY MOUTH DAILY.   blood glucose meter kit and supplies KIT Dispense based on patient and insurance preference. Use up to four times daily as directed.   doxycycline (VIBRA-TABS) 100 MG tablet Take 1 tablet (100 mg total) by mouth 2 (two) times daily for 10 days.   fluticasone-salmeterol (ADVAIR) 100-50 MCG/ACT AEPB Inhale 1 puff into the lungs 2 (two) times daily.   folic acid (FOLVITE) 800 MCG tablet Take 1 tablet (800 mcg total) by mouth daily.   furosemide (LASIX) 20 MG tablet Take 3 tablets (60 mg total) by mouth daily.   insulin glargine (LANTUS) 100 UNIT/ML Solostar Pen Inject 31 Units into the skin at bedtime.   isosorbide mononitrate (IMDUR) 30 MG 24 hr tablet Take 1 tablet (30 mg total) by mouth daily.   Lancets (ONETOUCH ULTRASOFT) lancets Use as instructed   lisinopril (ZESTRIL) 40 MG tablet Take 1 tablet (40 mg total) by mouth daily.  metFORMIN (GLUCOPHAGE) 1000 MG tablet Take 1 tablet (1,000 mg total) by mouth 2 (two) times daily with a meal.   metoprolol tartrate (LOPRESSOR) 25 MG tablet Take 1 tablet (25 mg total) by mouth 2 (two) times daily.   Nebulizers (COMPRESSOR/NEBULIZER) MISC 1 Units by Does not apply route as needed.   omeprazole (PRILOSEC) 20 MG capsule Take 1 capsule (20 mg total) by mouth daily.   SV IRON 325 (65 Fe) MG tablet Take 1 tablet by mouth once daily   Tiotropium Bromide Monohydrate (SPIRIVA RESPIMAT) 2.5 MCG/ACT AERS Inhale 2 puffs into the lungs daily.   No facility-administered  encounter medications on file as of 01/24/2023.     Review of Systems  Review of Systems  Constitutional: Negative.   HENT: Negative.    Cardiovascular: Negative.   Gastrointestinal: Negative.   Allergic/Immunologic: Negative.   Neurological: Negative.   Psychiatric/Behavioral: Negative.         Physical Exam  BP (!) 95/45   Pulse 82   Temp (!) 97 F (36.1 C)   Wt 154 lb 3.2 oz (69.9 kg)   SpO2 99%   BMI 24.89 kg/m   Wt Readings from Last 5 Encounters:  01/24/23 154 lb 3.2 oz (69.9 kg)  01/22/23 158 lb (71.7 kg)  01/20/23 161 lb 12.8 oz (73.4 kg)  01/18/23 162 lb 7.7 oz (73.7 kg)  01/17/23 162 lb 8 oz (73.7 kg)     Physical Exam Vitals and nursing note reviewed.  Constitutional:      General: She is not in acute distress.    Appearance: She is well-developed.  Cardiovascular:     Rate and Rhythm: Normal rate and regular rhythm.  Pulmonary:     Effort: Pulmonary effort is normal.     Breath sounds: Normal breath sounds.  Neurological:     Mental Status: She is alert and oriented to person, place, and time.      Lab Results:  CBC    Component Value Date/Time   WBC 4.1 01/20/2023 1338   WBC 2.9 (L) 01/18/2023 0129   RBC 3.77 (L) 01/20/2023 1338   HGB 9.9 (L) 01/20/2023 1338   HGB 8.6 (L) 12/25/2022 1120   HCT 31.5 (L) 01/20/2023 1338   HCT 31.8 (L) 01/20/2023 1338   PLT 201 01/20/2023 1338   PLT 303 12/25/2022 1120   MCV 84.4 01/20/2023 1338   MCV 78 (L) 12/25/2022 1120   MCH 26.3 01/20/2023 1338   MCHC 31.1 01/20/2023 1338   RDW 19.0 (H) 01/20/2023 1338   RDW 16.5 (H) 12/25/2022 1120   LYMPHSABS 0.5 (L) 01/20/2023 1338   LYMPHSABS 1.4 11/30/2021 1111   MONOABS 0.1 01/20/2023 1338   EOSABS 0.0 01/20/2023 1338   EOSABS 0.1 11/30/2021 1111   BASOSABS 0.0 01/20/2023 1338   BASOSABS 0.0 11/30/2021 1111    BMET    Component Value Date/Time   NA 139 01/20/2023 1338   NA 140 12/25/2022 1120   K 3.4 (L) 01/20/2023 1338   CL 102 01/20/2023  1338   CO2 27 01/20/2023 1338   GLUCOSE 291 (H) 01/20/2023 1338   BUN 12 01/20/2023 1338   BUN 10 12/25/2022 1120   CREATININE 0.89 01/20/2023 1338   CREATININE 0.62 03/07/2017 0918   CALCIUM 8.8 (L) 01/20/2023 1338   GFRNONAA >60 01/20/2023 1338   GFRNONAA >89 03/07/2017 0918   GFRAA 124 04/21/2019 1204   GFRAA >89 03/07/2017 0918    BNP    Component Value Date/Time  BNP 628.3 (H) 01/18/2023 0129    ProBNP No results found for: "PROBNP"  Imaging: MM 3D SCREENING MAMMOGRAM BILATERAL BREAST  Result Date: 01/21/2023 CLINICAL DATA:  Screening. EXAM: DIGITAL SCREENING BILATERAL MAMMOGRAM WITH TOMOSYNTHESIS AND CAD TECHNIQUE: Bilateral screening digital craniocaudal and mediolateral oblique mammograms were obtained. Bilateral screening digital breast tomosynthesis was performed. The images were evaluated with computer-aided detection. COMPARISON:  None available. ACR Breast Density Category b: There are scattered areas of fibroglandular density. FINDINGS: There are no findings suspicious for malignancy. IMPRESSION: No mammographic evidence of malignancy. A result letter of this screening mammogram will be mailed directly to the patient. RECOMMENDATION: Screening mammogram in one year. (Code:SM-B-01Y) BI-RADS CATEGORY  1: Negative. Electronically Signed   By: Elberta Fortis M.D.   On: 01/21/2023 11:07   DG Chest 2 View  Result Date: 01/17/2023 CLINICAL DATA:  Shortness of breath EXAM: CHEST - 2 VIEW COMPARISON:  CT chest dated 12/19/2022 FINDINGS: Very mild perihilar edema with small bilateral pleural effusions. Bilateral lower lobe opacities, chronic, favoring post infectious/inflammatory scarring over atelectasis. No pneumothorax. The heart is top-normal in size. Mild degenerative changes of the visualized thoracolumbar spine. IMPRESSION: Very mild perihilar edema with small bilateral pleural effusions. Chronic bilateral lower lobe opacities, favoring post infectious/inflammatory scarring  over atelectasis. Electronically Signed   By: Charline Bills M.D.   On: 01/17/2023 19:42     Assessment & Plan:   No problem-specific Assessment & Plan notes found for this encounter.     Ivonne Andrew, NP 01/24/2023

## 2023-01-24 NOTE — Patient Instructions (Signed)
1. Diarrhea, unspecified type  - loperamide (IMODIUM A-D) 2 MG tablet; Take 1 tablet (2 mg total) by mouth 4 (four) times daily as needed for diarrhea or loose stools.  Dispense: 30 tablet; Refill: 0  Follow up:  Follow up as scheduled

## 2023-01-27 ENCOUNTER — Ambulatory Visit (INDEPENDENT_AMBULATORY_CARE_PROVIDER_SITE_OTHER): Payer: Medicaid Other | Admitting: Pulmonary Disease

## 2023-01-27 ENCOUNTER — Encounter (HOSPITAL_BASED_OUTPATIENT_CLINIC_OR_DEPARTMENT_OTHER): Payer: Self-pay | Admitting: Pulmonary Disease

## 2023-01-27 VITALS — BP 114/72 | HR 82 | Temp 97.8°F | Ht 67.0 in | Wt 153.0 lb

## 2023-01-27 DIAGNOSIS — J449 Chronic obstructive pulmonary disease, unspecified: Secondary | ICD-10-CM | POA: Diagnosis not present

## 2023-01-27 DIAGNOSIS — Z720 Tobacco use: Secondary | ICD-10-CM

## 2023-01-27 MED ORDER — TRELEGY ELLIPTA 200-62.5-25 MCG/ACT IN AEPB: 1.0000 | INHALATION_SPRAY | Freq: Every day | RESPIRATORY_TRACT | 5 refills | Status: DC

## 2023-01-27 MED ORDER — PREDNISONE 10 MG PO TABS: ORAL_TABLET | ORAL | 0 refills | Status: AC

## 2023-01-27 NOTE — Patient Instructions (Signed)
Trelegy one puff daily, and rinse your mouth after each use  Prednisone 10 mg pill >> 3 pills daily for 2 days, 2 pills daily for 2 days, 1 pill daily for 2 days  Stop using spiriva and advair once you get trelegy  Will arrange for pulmonary function test in [facility]  Follow up in 2 months with Dr. Craige Mccullough in the [place] office

## 2023-01-27 NOTE — Progress Notes (Signed)
[facility]  Chief Complaint  Patient presents with   Consult    Consult for SOB.     Past Surgical History:  She  has a past surgical history that includes Tubal ligation.  Past Medical History:  DM type 2, Gallstones, HLD, HTN, Vit D deficiency, CVA, Diastolic CHF, Claudication  Constitutional:  BP 114/72 (BP Location: Right Arm, Patient Position: Sitting, Cuff Size: Normal)   Pulse 82   Temp 97.8 F (36.6 C) (Oral)   Ht 5\' 7"  (1.702 m)   Wt 153 lb (69.4 kg)   SpO2 95%   BMI 23.96 kg/m   Brief Summary:  Misty Mccullough is a 52 y.o. female smoker with cough.       Subjective:   She has noticed more trouble with her breathing for the past few months.  She had pneumonia twice.  Her house she was living in before had mold.  She was told she has COPD but didn't do a breathing test yet.  She has a cough with clear sputum and gets wheezing.  Inhalers help.  She felt better when she used prednisone.  She smoked 2 ppd, and is down to 1 ppd.  She was on doxycycline earlier this month for her breathing.  She gets winded walking to her mailbox.  She denies fever, hemoptysis, or leg swelling.  She is sleeping okay.  No hx of TB.  She is not aware of anyone in the family with lung disease.    Physical Exam:   Appearance - well kempt   ENMT - no sinus tenderness, no oral exudate, no LAN, Mallampati 3 airway, no stridor  Respiratory - decreased breath sounds bilaterally, no wheezing or rales  CV - s1s2 regular rate and rhythm, no murmurs  Ext - no clubbing, no edema  Skin - no rashes  Psych - normal mood and affect   Pulmonary testing:    Chest Imaging:  CT angio chest 12/19/22 >> small bilateral effusions, patchy ASD bilateral  Sleep Tests:    Cardiac Tests:  Echo 11/15/22 >> EF 60 to 65%, grade 1 DD  Social History:  She  reports that she has been smoking cigarettes. She has a 32.00 pack-year smoking history. She has never  used smokeless tobacco. She reports that she does not currently use alcohol. She reports that she does not use drugs.  Family History:  Her family history includes Bone cancer in her paternal uncle; Diabetes in her father and mother; Heart attack in her paternal grandfather, paternal grandmother, and paternal uncle; Heart attack (age of onset: 71) in her father; Stroke in her father and sister; Uterine cancer in her mother.     Assessment/Plan:   Dyspnea on exertion. - she has history of tobacco abuse, carriers a diagnosis of COPD, had recent pneumonia, has seen cardiology recently for diastolic CHF and claudication, and has seen hematology for anemia - will have her try trelegy 200 one puff daily - will give additional course of prednisone - prn albuterol - will arrange for PFT at [facility] - she has CT chest scheduled for May 2024  Tobacco abuse. - she will try to quit on her own  Chronic diastolic CHF. - she stopped imdur because it caused a headache >> advised her to d/w cardiology  Time Spent Involved in Patient Care on Day of Examination:  51 minutes  Follow up:   Patient Instructions  Trelegy one puff daily, and rinse your mouth  after each use  Prednisone 10 mg pill >> 3 pills daily for 2 days, 2 pills daily for 2 days, 1 pill daily for 2 days  Stop using spiriva and advair once you get trelegy  Will arrange for pulmonary function test in [facility]  Follow up in 2 months with Dr. Craige Cotta in the [place] office  Medication List:   Allergies as of 01/27/2023   No Known Allergies      Medication List        Accurate as of January 27, 2023  2:13 PM. If you have any questions, ask your nurse or doctor.          STOP taking these medications    doxycycline 100 MG tablet Commonly known as: VIBRA-TABS Stopped by: Coralyn Helling, MD   fluticasone-salmeterol 100-50 MCG/ACT Aepb Commonly known as: ADVAIR Stopped by: Coralyn Helling, MD   Spiriva Respimat 2.5 MCG/ACT  Aers Generic drug: Tiotropium Bromide Monohydrate Stopped by: Coralyn Helling, MD       TAKE these medications    albuterol (2.5 MG/3ML) 0.083% nebulizer solution Commonly known as: PROVENTIL Take 3 mLs (2.5 mg total) by nebulization every 4 (four) hours as needed for wheezing or shortness of breath.   albuterol 108 (90 Base) MCG/ACT inhaler Commonly known as: VENTOLIN HFA Inhale 2 puffs into the lungs every 6 (six) hours as needed for wheezing or shortness of breath.   aspirin EC 81 MG tablet Take 1 tablet (81 mg total) by mouth daily with breakfast. Swallow whole.   atorvastatin 10 MG tablet Commonly known as: LIPITOR TAKE 1 TABLET (10 MG TOTAL) BY MOUTH DAILY.   blood glucose meter kit and supplies Kit Dispense based on patient and insurance preference. Use up to four times daily as directed.   Compressor/Nebulizer Misc 1 Units by Does not apply route as needed.   folic acid 800 MCG tablet Commonly known as: FOLVITE Take 1 tablet (800 mcg total) by mouth daily.   furosemide 20 MG tablet Commonly known as: LASIX Take 3 tablets (60 mg total) by mouth daily.   insulin glargine 100 UNIT/ML Solostar Pen Commonly known as: LANTUS Inject 31 Units into the skin at bedtime.   isosorbide mononitrate 30 MG 24 hr tablet Commonly known as: IMDUR Take 1 tablet (30 mg total) by mouth daily.   lisinopril 40 MG tablet Commonly known as: ZESTRIL Take 1 tablet (40 mg total) by mouth daily.   loperamide 2 MG tablet Commonly known as: Imodium A-D Take 1 tablet (2 mg total) by mouth 4 (four) times daily as needed for diarrhea or loose stools.   metFORMIN 1000 MG tablet Commonly known as: GLUCOPHAGE Take 1 tablet (1,000 mg total) by mouth 2 (two) times daily with a meal.   metoprolol tartrate 25 MG tablet Commonly known as: LOPRESSOR Take 1 tablet (25 mg total) by mouth 2 (two) times daily.   omeprazole 20 MG capsule Commonly known as: PRILOSEC Take 1 capsule (20 mg total) by  mouth daily.   onetouch ultrasoft lancets Use as instructed   predniSONE 10 MG tablet Commonly known as: DELTASONE Take 3 tablets (30 mg total) by mouth daily with breakfast for 2 days, THEN 2 tablets (20 mg total) daily with breakfast for 2 days, THEN 1 tablet (10 mg total) daily with breakfast for 2 days. Start taking on: January 27, 2023 Started by: Coralyn Helling, MD   SV Iron 325 (65 FE) MG tablet Generic drug: ferrous sulfate Take 1 tablet by  mouth once daily   Trelegy Ellipta 200-62.5-25 MCG/ACT Aepb Generic drug: Fluticasone-Umeclidin-Vilant Inhale 1 puff into the lungs daily in the afternoon. Started by: Coralyn Helling, MD        Signature:  Coralyn Helling, MD [facility] Pager - [phone] 01/27/2023, 2:13 PM

## 2023-01-28 ENCOUNTER — Other Ambulatory Visit: Payer: Self-pay

## 2023-01-28 DIAGNOSIS — Z1211 Encounter for screening for malignant neoplasm of colon: Secondary | ICD-10-CM

## 2023-01-29 ENCOUNTER — Ambulatory Visit (HOSPITAL_COMMUNITY): Payer: Medicaid Other

## 2023-01-30 ENCOUNTER — Other Ambulatory Visit: Payer: Medicaid Other | Admitting: Pharmacist

## 2023-01-30 ENCOUNTER — Telehealth: Payer: Self-pay | Admitting: Pharmacist

## 2023-01-30 DIAGNOSIS — K219 Gastro-esophageal reflux disease without esophagitis: Secondary | ICD-10-CM

## 2023-01-30 DIAGNOSIS — E119 Type 2 diabetes mellitus without complications: Secondary | ICD-10-CM

## 2023-01-30 DIAGNOSIS — Z760 Encounter for issue of repeat prescription: Secondary | ICD-10-CM

## 2023-01-30 LAB — COLOGUARD

## 2023-01-30 NOTE — Progress Notes (Signed)
Attempted to contact patient for scheduled appointment for medication management. Left HIPAA compliant message for patient to return my call at their convenience.   Will send MyChart.  Catie T. Nixxon Faria, PharmD, BCACP, CPP [facility] [phone]  

## 2023-01-30 NOTE — Progress Notes (Signed)
01/30/2023 Name: Misty Mccullough MRN: [id number] DOB: 11/12/1970  Chief Complaint  Patient presents with   Medication Management   Hypertension   Diabetes   Hyperlipidemia    Misty Mccullough is a 52 y.o. year old female who presented for a telephone visit.   They were referred to the pharmacist by their PCP for assistance in managing diabetes and complex medication management.   Patient is participating in a Managed Medicaid Plan:  Yes  Subjective:  Care Team: Primary Care Provider: Ivonne Andrew, NP ; Next Scheduled Visit: 03/19/23 Cardiologist: Jenene Slicker; Next Scheduled Visit: 03/04/23 Pulmonologist: Craige Cotta; 04/15/23  Medication Access/Adherence  Current Pharmacy:  [facility] [address] [place] - [address] [place] [address] Phone: [phone] Fax: [phone]   Patient reports affordability concerns with their medications: No  Patient reports access/transportation concerns to their pharmacy: No  Patient reports adherence concerns with their medications:  No    Two recent hospitalizations for COPD + HF exacerbation  Diabetes:  Current medications: metformin 1000 mg twice daily, Lantus 31 units daily  Reports diarrhea that has been more frequent over the past few weeks.   Current glucose readings: not checking regularly  Patient denies hypoglycemic s/sx including dizziness, shakiness, sweating. Patient denies hyperglycemic symptoms including polyuria, polydipsia, polyphagia, nocturia, neuropathy, blurred vision.  Hyperlipidemia/ASCVD Risk Reduction  Current lipid lowering medications: atorvastatin 10 mg daily ordered, has not been filled in several months  Antiplatelet regimen: aspirin 81 mg daily   Heart Failure:  Current medications:  ACEi/ARB/ARNI: lisinopril 40 mg daily SGLT2i: none Beta blocker: prescribed metoprolol tartrate 25 mg twice daily, has stopped Mineralocorticoid Receptor Antagonist: none Diuretic regimen:  furosemide 60 mg daily  Reports she developed a headache when she started isosorbide and metoprolol. Has stopped both  COPD:  Current medications: Advair twice daily, Spiriva once daily -> Dr. Craige Cotta planned to switch to Trelegy but this is not preferred by Medicaid   Reports 2 exacerbations in the past year  Objective:  Lab Results  Component Value Date   HGBA1C 7.9 (A) 12/18/2022    Lab Results  Component Value Date   CREATININE 0.89 01/20/2023   BUN 12 01/20/2023   NA 139 01/20/2023   K 3.4 (L) 01/20/2023   CL 102 01/20/2023   CO2 27 01/20/2023    Lab Results  Component Value Date   CHOL 234 (H) 12/18/2022   HDL 57 12/18/2022   LDLCALC 167 (H) 12/18/2022   TRIG 62 12/18/2022   CHOLHDL 4.1 12/18/2022    Medications Reviewed Today     Reviewed by Alden Hipp, RPH-CPP (Pharmacist) on 01/30/23 at 1555  Med List Status: <None>   Medication Order Taking? Sig Documenting Provider Last Dose Status Informant  albuterol (PROVENTIL) (2.5 MG/3ML) 0.083% nebulizer solution [id number] Yes Take 3 mLs (2.5 mg total) by nebulization every 4 (four) hours as needed for wheezing or shortness of breath. Shon Hale, MD Taking Active Self, Pharmacy Records  albuterol (VENTOLIN HFA) 108 (90 Base) MCG/ACT inhaler [id number] Yes Inhale 2 puffs into the lungs every 6 (six) hours as needed for wheezing or shortness of breath. Ivonne Andrew, NP Taking Active   aspirin EC 81 MG tablet [id number] Yes Take 1 tablet (81 mg total) by mouth daily with breakfast. Swallow whole. Shon Hale, MD Taking Active Self, Pharmacy Records  atorvastatin (LIPITOR) 10 MG tablet [id number]  TAKE 1 TABLET (10 MG TOTAL) BY MOUTH DAILY. Orion Crook I, NP  Expired  01/28/23 2359 Self, Pharmacy Records  blood glucose meter kit and supplies KIT [id number]  Dispense based on patient and insurance preference. Use up to four times daily as directed. Kathrynn Speed, NP  Active Self, Pharmacy Records   Fluticasone-Umeclidin-Vilant [facility] ELLIPTA) 200-62.5-25 MCG/ACT AEPB [id number] Yes Inhale 1 puff into the lungs daily in the afternoon. Coralyn Helling, MD Taking Active   folic acid (FOLVITE) 800 MCG tablet [id number] Yes Take 1 tablet (800 mcg total) by mouth daily. Ivonne Andrew, NP Taking Active   furosemide (LASIX) 20 MG tablet [id number] Yes Take 3 tablets (60 mg total) by mouth daily. Mallipeddi, Vishnu P, MD Taking Active   insulin glargine (LANTUS) 100 UNIT/ML Solostar Pen [id number]  Inject 31 Units into the skin at bedtime. Shon Hale, MD  Active Self, Pharmacy Records  isosorbide mononitrate (IMDUR) 30 MG 24 hr tablet [id number] No Take 1 tablet (30 mg total) by mouth daily.  Patient not taking: Reported on 01/30/2023   Marjo Bicker, MD Not Taking Active   Lancets [facility] ULTRASOFT) lancets [id number] No Use as instructed  Patient not taking: Reported on 01/30/2023   Orion Crook I, NP Not Taking Active Self, Pharmacy Records  lisinopril (ZESTRIL) 40 MG tablet [id number] Yes Take 1 tablet (40 mg total) by mouth daily. Shon Hale, MD Taking Active Self, Pharmacy Records  loperamide (IMODIUM A-D) 2 MG tablet [id number] No Take 1 tablet (2 mg total) by mouth 4 (four) times daily as needed for diarrhea or loose stools.  Patient not taking: Reported on 01/30/2023   Ivonne Andrew, NP Not Taking Active   metFORMIN (GLUCOPHAGE) 1000 MG tablet [id number] Yes Take 1 tablet (1,000 mg total) by mouth 2 (two) times daily with a meal. Emokpae, Courage, MD Taking Active Self, Pharmacy Records  metoprolol tartrate (LOPRESSOR) 25 MG tablet [id number] Yes Take 1 tablet (25 mg total) by mouth 2 (two) times daily. Mallipeddi, Orion Modest, MD Taking Active   Nebulizers (COMPRESSOR/NEBULIZER) MISC [id number] Yes 1 Units by Does not apply route as needed. Shon Hale, MD Taking Active Self, Pharmacy Records  omeprazole (PRILOSEC) 20 MG capsule [id number] Yes Take 1 capsule (20 mg total)  by mouth daily. Shon Hale, MD Taking Active Self, Pharmacy Records  predniSONE (DELTASONE) 10 MG tablet [id number] Yes Take 3 tablets (30 mg total) by mouth daily with breakfast for 2 days, THEN 2 tablets (20 mg total) daily with breakfast for 2 days, THEN 1 tablet (10 mg total) daily with breakfast for 2 days. Coralyn Helling, MD Taking Active   SV IRON 325 (65 Fe) MG tablet [id number] Yes Take 1 tablet by mouth once daily Ivonne Andrew, NP Taking Active               Assessment/Plan:   Diabetes: - Currently uncontrolled - Reviewed long term cardiovascular and renal outcomes of uncontrolled blood sugar - Reviewed goal A1c, goal fasting, and goal 2 hour post prandial glucose - Recommend to change to metformin XR to see if improvement in diarrhea. Start Jardiance for DM + HF benefit. Discussed with patient that we may reduce furosemide dose moving forward pending diuretic benefit of furosemide. Reduce Lantus to 28 units daily to reduce risk of hypoglycemia. Will discuss GLP1 moving forward - Discussed CGM. Patient interested. Will complete PA and order for her, she will bring with her to imaging at [facility] on 5/10 and will meet me to place.    Hyperlipidemia/ASCVD Risk Reduction: - Currently uncontrolled.  - Recommend to resend  atorvastatin. Patient counseled to restart.   Heart Failure: - Currently opportunities for optimization - Start Jardiance as above. Will notify cardiology. Discussed with patient that it was likely isosorbide causing headache; recommend to start metoprolol as ordered. - Recommend to continue current regimen at this time. Monitor for dehydration and reduction in furosemide dose.   COPD: - Currently moderately well controlled.  - Reviewed appropriate inhaler technique as well as concepts of maintenance vs rescue therapy. Will notify Dr. Craige Cotta that Trelegy is non-preferred on Medicaid; anticipate any PA would be denied but will see if his team has attempted  yet  Follow Up Plan: in person in 2 weeks  Catie TClearance Coots, PharmD, BCACP, CPP [facility] [phone]

## 2023-01-31 ENCOUNTER — Other Ambulatory Visit: Payer: Medicaid Other | Admitting: Pharmacist

## 2023-01-31 MED ORDER — FREESTYLE LIBRE 3 SENSOR MISC: 1 refills | Status: DC

## 2023-01-31 MED ORDER — ATORVASTATIN CALCIUM 10 MG PO TABS
10.0000 mg | ORAL_TABLET | Freq: Every day | ORAL | 3 refills | Status: DC
Start: 2023-01-31 — End: 2023-02-20

## 2023-01-31 MED ORDER — EMPAGLIFLOZIN 10 MG PO TABS: 10.0000 mg | ORAL_TABLET | Freq: Every day | ORAL | 1 refills | Status: DC

## 2023-01-31 MED ORDER — METFORMIN HCL ER 500 MG PO TB24: 1000.0000 mg | ORAL_TABLET | Freq: Two times a day (BID) | ORAL | 1 refills | Status: DC

## 2023-01-31 MED ORDER — INSULIN GLARGINE 100 UNIT/ML SOLOSTAR PEN
28.0000 [IU] | PEN_INJECTOR | Freq: Every day | SUBCUTANEOUS | 1 refills | Status: DC
Start: 2023-01-31 — End: 2023-03-04

## 2023-01-31 MED ORDER — OMEPRAZOLE 20 MG PO CPDR
20.0000 mg | DELAYED_RELEASE_CAPSULE | Freq: Every day | ORAL | 1 refills | Status: DC
Start: 2023-01-31 — End: 2023-09-22

## 2023-01-31 NOTE — Patient Instructions (Addendum)
Hi Misty Mccullough,   It was great talking to you! We covered a lot of things today:   1) It was likely the isosorbide causing your headache. I recommend you restart the metoprolol tartrate 25 mg twice daily. I let your cardiologist know this update.   2) Medicaid does not prefer the Trelegy inhaler. Continue Advair twice daily and Spiriva once daily, every day, no matter your symptoms. These are maintenance inhalers to help control your breathing and reduce the chance of you having a COPD flare. The albuterol (either the Ventolin, the albuterol, or the albuterol nebulizer) are rescue medication that you can take when you have symptoms. I let your pulmonologist know this update.   3) Let's try switching to the extended release version of metformin and see if this helps your diarrhea. This comes in a 500 mg tablet - take two metformin XR 500 mg tablets twice daily.   4) Start Jardiance 10 mg once daily. This medication helps with blood sugars and your heart. Please let me know if you develop any symptoms of yeast infections or urinary tract infections, like burning, itching, stinging while urinating. Please also let us know if you feel like you are getting dehydrated. Reduce Lantus to 28 units daily to reduce your risk of low blood sugars with adding the Jardiance.   5) We have refilled atorvastatin 10 mg daily. This medication lowers your cholesterol and helps protect you from heart attacks and strokes. You can take this any time of the day.   6) I have sent the prescription for [facility]. You can look at the website here of how to apply. We can still meet on May 10th after your appointment at [facility], but I didn't want you to have to wait if you and your daughter wanted to go ahead and set it up! [email or web address]://[email or web address].abbott/us-en/how-to-set-up.html   Please call me with any questions!   Catie Clearance Coots, PharmD [facility] [phone]

## 2023-01-31 NOTE — Progress Notes (Signed)
Called pt and advice. Gh

## 2023-02-04 ENCOUNTER — Ambulatory Visit (HOSPITAL_COMMUNITY)
Admission: RE | Admit: 2023-02-04 | Discharge: 2023-02-04 | Disposition: A | Discharge status: Home / Self Care | Payer: Medicaid Other | Source: Ambulatory Visit | Attending: Nurse Practitioner | Admitting: Nurse Practitioner

## 2023-02-04 DIAGNOSIS — D649 Anemia, unspecified: Secondary | ICD-10-CM | POA: Diagnosis not present

## 2023-02-04 DIAGNOSIS — K8020 Calculus of gallbladder without cholecystitis without obstruction: Secondary | ICD-10-CM | POA: Diagnosis not present

## 2023-02-04 DIAGNOSIS — R161 Splenomegaly, not elsewhere classified: Secondary | ICD-10-CM | POA: Diagnosis not present

## 2023-02-05 DIAGNOSIS — J42 Unspecified chronic bronchitis: Secondary | ICD-10-CM | POA: Diagnosis not present

## 2023-02-06 ENCOUNTER — Other Ambulatory Visit: Payer: Self-pay

## 2023-02-06 ENCOUNTER — Other Ambulatory Visit: Payer: Self-pay | Admitting: Internal Medicine

## 2023-02-06 DIAGNOSIS — R0602 Shortness of breath: Secondary | ICD-10-CM

## 2023-02-06 DIAGNOSIS — R059 Cough, unspecified: Secondary | ICD-10-CM

## 2023-02-06 DIAGNOSIS — Z760 Encounter for issue of repeat prescription: Secondary | ICD-10-CM

## 2023-02-06 NOTE — Telephone Encounter (Signed)
Medication was forwarded to [facility]. KH

## 2023-02-06 NOTE — Telephone Encounter (Addendum)
Caller & Relationship to patient:  MRN #  [id number]   Call Back Number:   Date of Last Office Visit: 01/24/2023     Date of Next Office Visit: 02/14/2023    Medication(s) to be Refilled: Albuterol refill  Pt called back and ask medication pills that help her breath...SHEHERHERS didn't know the name  PT also asking something for DIARRHEA  Preferred Pharmacy:   ** Please notify patient to allow 48-72 hours to process** **Let patient know to contact pharmacy at the end of the day to make sure medication is ready. ** **If patient has not been seen in a year or longer, book an appointment **Advise to use MyChart for refill requests OR to contact their pharmacy

## 2023-02-06 NOTE — Telephone Encounter (Signed)
Please advise KH 

## 2023-02-07 ENCOUNTER — Other Ambulatory Visit: Payer: Self-pay | Admitting: *Deleted

## 2023-02-07 MED ORDER — FUROSEMIDE 40 MG PO TABS: 60.0000 mg | ORAL_TABLET | Freq: Every day | ORAL | 0 refills | Status: DC

## 2023-02-07 MED ORDER — ALBUTEROL SULFATE HFA 108 (90 BASE) MCG/ACT IN AERS
2.0000 | INHALATION_SPRAY | Freq: Four times a day (QID) | RESPIRATORY_TRACT | 11 refills | Status: DC | PRN
Start: 2023-02-07 — End: 2024-02-16

## 2023-02-11 ENCOUNTER — Encounter: Payer: Self-pay | Admitting: Gastroenterology

## 2023-02-11 ENCOUNTER — Telehealth: Payer: Self-pay | Admitting: *Deleted

## 2023-02-11 ENCOUNTER — Encounter: Payer: Self-pay | Admitting: *Deleted

## 2023-02-11 ENCOUNTER — Other Ambulatory Visit (HOSPITAL_COMMUNITY)
Admission: RE | Admit: 2023-02-11 | Discharge: 2023-02-11 | Disposition: A | Discharge status: Home / Self Care | Payer: Medicaid Other | Source: Ambulatory Visit | Attending: Gastroenterology | Admitting: Gastroenterology

## 2023-02-11 ENCOUNTER — Ambulatory Visit (INDEPENDENT_AMBULATORY_CARE_PROVIDER_SITE_OTHER): Payer: Medicaid Other | Admitting: Gastroenterology

## 2023-02-11 VITALS — BP 173/67 | HR 86 | Temp 97.3°F | Ht 67.0 in | Wt 157.6 lb

## 2023-02-11 DIAGNOSIS — R1310 Dysphagia, unspecified: Secondary | ICD-10-CM

## 2023-02-11 DIAGNOSIS — R634 Abnormal weight loss: Secondary | ICD-10-CM

## 2023-02-11 DIAGNOSIS — K529 Noninfective gastroenteritis and colitis, unspecified: Secondary | ICD-10-CM

## 2023-02-11 DIAGNOSIS — D508 Other iron deficiency anemias: Secondary | ICD-10-CM | POA: Diagnosis not present

## 2023-02-11 DIAGNOSIS — R935 Abnormal findings on diagnostic imaging of other abdominal regions, including retroperitoneum: Secondary | ICD-10-CM

## 2023-02-11 DIAGNOSIS — K219 Gastro-esophageal reflux disease without esophagitis: Secondary | ICD-10-CM

## 2023-02-11 LAB — TSH: TSH: 1.546 u[IU]/mL (ref 0.350–4.500)

## 2023-02-11 MED ORDER — DICYCLOMINE HCL 10 MG PO CAPS
10.0000 mg | ORAL_CAPSULE | Freq: Three times a day (TID) | ORAL | 3 refills | Status: DC
Start: 2023-02-11 — End: 2023-11-20

## 2023-02-11 NOTE — H&P (View-Only) (Signed)
  Gastroenterology Office Note    Referring Provider: Nichols, Tonya S, NP Primary Care Physician:  Nichols, Tonya S, NP  Primary GI: Dr. Rourk    Chief Complaint   Chief Complaint  Patient presents with   New Patient (Initial Visit)     History of Present Illness   Judine Desrochers is a 52 y.o. female presenting today at the request of Tonya Nichols, NP, to establish care. No prior colonoscopy or upper endoscopy.  She has been followed by Hematology due to anemia with chronic disease and IDA component. Ferritin a few weeks ago down to 17, Hgb 9.9. She is on oral iron daily. She also had an US abdomen complete ordered by PCP due to bloating and found to have heterogeneous echogenicity of liver with suggestion of lobular contours and left liver hypertrophy, splenomegaly.    Black stool on iron. No hematochezia. Solid food dysphagia for years. No significant odynophagia. +GERD on omeprazole daily   Diarrhea: since on metformin. Has been on metformin 8 or 9 years. Will have 12 loose stools per day, postprandially. Urgency. Stools used to be 4-5 tmes per day. Now constantly going. Imodium has not helped OTC taking BID. Never has soft stool. Stool looks slimy at times.   +weight loss. Used to weigh about 220 lbs about 9 years ago and has had slow steady weight loss. Will have lower abdominal cramping all the time, worsened with BM.    No ETOH use. No drug use. +smoke 1.5 ppd for 35 years.   Unknown GI family history. Maternal grandmother: ovarian cancer metastatic. Brother: cirrhosis of liver. History of ETOH. Father: CVA  Hep C antibody negative in the past. Normal LFTs.   Past Medical History:  Diagnosis Date   Bilateral lower extremity edema    CHF (congestive heart failure) (HCC)    COPD (chronic obstructive pulmonary disease) (HCC)    Cough    Diabetes mellitus without complication (HCC)    Gallstones    Heavy cigarette smoker    Hemoglobin A1C between 7% and 9% indicating  borderline diabetic control (HCC) 08/2019   Hypercholesteremia    Hyperglycemia    Hypertension    Shortness of breath    Stroke (cerebrum) (HCC)    Vitamin D deficiency 12/2020    Past Surgical History:  Procedure Laterality Date   TUBAL LIGATION      Current Outpatient Medications  Medication Sig Dispense Refill   albuterol (PROVENTIL) (2.5 MG/3ML) 0.083% nebulizer solution Take 3 mLs (2.5 mg total) by nebulization every 4 (four) hours as needed for wheezing or shortness of breath. 75 mL 2   albuterol (VENTOLIN HFA) 108 (90 Base) MCG/ACT inhaler Inhale 2 puffs into the lungs every 6 (six) hours as needed for wheezing or shortness of breath. 1 each 11   aspirin EC 81 MG tablet Take 1 tablet (81 mg total) by mouth daily with breakfast. Swallow whole. 30 tablet 12   atorvastatin (LIPITOR) 10 MG tablet Take 1 tablet (10 mg total) by mouth daily. 90 tablet 3   blood glucose meter kit and supplies KIT Dispense based on patient and insurance preference. Use up to four times daily as directed. 1 each 1   Continuous Glucose Sensor (FREESTYLE LIBRE 3 SENSOR) MISC Place 1 sensor on the skin every 14 days. Use to check glucose continuously 6 each 1   dicyclomine (BENTYL) 10 MG capsule Take 1 capsule (10 mg total) by mouth 4 (four) times daily -  before meals   and at bedtime. 120 capsule 3   Fluticasone-Umeclidin-Vilant (TRELEGY ELLIPTA) 200-62.5-25 MCG/ACT AEPB Inhale 1 puff into the lungs daily in the afternoon. 60 each 5   folic acid (FOLVITE) 800 MCG tablet Take 1 tablet (800 mcg total) by mouth daily. 30 tablet 2   furosemide (LASIX) 40 MG tablet Take 1.5 tablets (60 mg total) by mouth daily. 135 tablet 0   insulin glargine (LANTUS) 100 UNIT/ML Solostar Pen Inject 28 Units into the skin daily. 15 mL 1   lisinopril (ZESTRIL) 40 MG tablet Take 1 tablet (40 mg total) by mouth daily. 30 tablet 3   metFORMIN (GLUCOPHAGE-XR) 500 MG 24 hr tablet Take 2 tablets (1,000 mg total) by mouth 2 (two) times  daily with a meal. 360 tablet 1   metoprolol tartrate (LOPRESSOR) 25 MG tablet Take 1 tablet (25 mg total) by mouth 2 (two) times daily. 180 tablet 3   Nebulizers (COMPRESSOR/NEBULIZER) MISC 1 Units by Does not apply route as needed. 1 each 0   omeprazole (PRILOSEC) 20 MG capsule Take 1 capsule (20 mg total) by mouth daily. 90 capsule 1   SV IRON 325 (65 Fe) MG tablet Take 1 tablet by mouth once daily 90 tablet 0   empagliflozin (JARDIANCE) 10 MG TABS tablet Take 1 tablet (10 mg total) by mouth daily before breakfast. (Patient not taking: Reported on 02/11/2023) 90 tablet 1   No current facility-administered medications for this visit.    Allergies as of 02/11/2023   (No Known Allergies)    Family History  Problem Relation Age of Onset   Diabetes Mother    Uterine cancer Mother    Diabetes Father    Heart attack Father 62   Stroke Father    Stroke Sister    Cirrhosis Brother        alcohol   Ovarian cancer Maternal Grandmother    Heart attack Paternal Grandmother    Heart attack Paternal Grandfather    Heart attack Paternal Uncle    Bone cancer Paternal Uncle    Colon cancer Neg Hx        unknown   Colon polyps Neg Hx        unknown    Social History   Socioeconomic History   Marital status: Legally Separated    Spouse name: Not on file   Number of children: 5   Years of education: Not on file   Highest education level: Not on file  Occupational History   Not on file  Tobacco Use   Smoking status: Every Day    Packs/day: 1.00    Years: 32.00    Additional pack years: 0.00    Total pack years: 32.00    Types: Cigarettes   Smokeless tobacco: Never   Tobacco comments:    Since age 14.  Vaping Use   Vaping Use: Never used  Substance and Sexual Activity   Alcohol use: Not Currently    Comment: Occasional   Drug use: No   Sexual activity: Yes    Partners: Male  Other Topics Concern   Not on file  Social History Narrative   Not on file   Social Determinants  of Health   Financial Resource Strain: Not on file  Food Insecurity: No Food Insecurity (12/20/2022)   Hunger Vital Sign    Worried About Running Out of Food in the Last Year: Never true    Ran Out of Food in the Last Year: Never true  Transportation Needs: No   Transportation Needs (12/20/2022)   PRAPARE - Transportation    Lack of Transportation (Medical): No    Lack of Transportation (Non-Medical): No  Physical Activity: Not on file  Stress: Not on file  Social Connections: Not on file  Intimate Partner Violence: Not At Risk (12/20/2022)   Humiliation, Afraid, Rape, and Kick questionnaire    Fear of Current or Ex-Partner: No    Emotionally Abused: No    Physically Abused: No    Sexually Abused: No     Review of Systems   Gen: Denies any fever, chills, fatigue, weight loss, lack of appetite.  CV: Denies chest pain, heart palpitations, peripheral edema, syncope.  Resp: +DOE GI: Denies dysphagia or odynophagia. Denies jaundice, hematemesis, fecal incontinence. GU : Denies urinary burning, urinary frequency, urinary hesitancy MS: Denies joint pain, muscle weakness, cramps, or limitation of movement.  Derm: Denies rash, itching, dry skin Psych: Denies depression, anxiety, memory loss, and confusion Heme: Denies bruising, bleeding, and enlarged lymph nodes.   Physical Exam   BP (!) 173/67 (BP Location: Right Arm, Patient Position: Sitting, Cuff Size: Normal)   Pulse 86   Temp (!) 97.3 F (36.3 C) (Temporal)   Ht 5' 7" (1.702 m)   Wt 157 lb 9.6 oz (71.5 kg)   SpO2 96%   BMI 24.68 kg/m  General:   Alert and oriented. Pleasant and cooperative. Well-nourished and well-developed.  Head:  Normocephalic and atraumatic. Eyes:  Without icterus Ears:  Normal auditory acuity. Lungs:  Clear to auscultation bilaterally.  Heart:  S1, S2 present without murmurs appreciated.  Abdomen:  +BS, soft, mild TTP RLQ , liver margin palpable across midline and non-distended.  No guarding or  rebound. No masses appreciated.  Rectal:  Deferred  Msk:  Symmetrical without gross deformities. Normal posture. Extremities:  Without edema. Neurologic:  Alert and  oriented x4;  grossly normal neurologically. Skin:  Intact without significant lesions or rashes. Psych:  Alert and cooperative. Normal mood and affect.  Lab Results  Component Value Date   WBC 4.1 01/20/2023   HGB 9.9 (L) 01/20/2023   HCT 31.5 (L) 01/20/2023   HCT 31.8 (L) 01/20/2023   MCV 84.4 01/20/2023   PLT 201 01/20/2023   Lab Results  Component Value Date   IRON 46 01/20/2023   TIBC 372 01/20/2023   FERRITIN 17 01/20/2023   Lab Results  Component Value Date   ALT 11 01/20/2023   AST 15 01/20/2023   ALKPHOS 74 01/20/2023   BILITOT 0.3 01/20/2023   Lab Results  Component Value Date   CREATININE 0.89 01/20/2023   BUN 12 01/20/2023   NA 139 01/20/2023   K 3.4 (L) 01/20/2023   CL 102 01/20/2023   CO2 27 01/20/2023      Assessment   Virginie Laredo is a 52 y.o. female presenting today at the request of Tonya Nichols, NP, to establish care as a new patient. She has a history of anemia with chronic disease and IDA component, and she was recently found to have lobular contours of liver and splenomegaly on ultrasound.   Anemia: chronic disease and IDA component. No overt GI bleeding. No prior colonoscopy or EGD. Recent ferritin 17. Hgb around baseline of 9.9. Continues on oral iron. Followed by Hematology. Needs colonoscopy and EGD. No FH colon cancer or polyps that is known.  Lobular contours of liver: with splenomegaly. Normal platelets. May have underlying hepatic steatosis. No ETOH use. Hep C negative in the past. Will order elastography to   further characterize. I doubt dealing with advanced liver disease at this time.   Dysphagia: chronically noted. No prior dilation. Will add dilation at time of EGD.  GERD: Continue omeprazole daily.  Chronic diarrhea: noted for 8-9 years. Chronically on metformin  and feels this has contributed. Suspect could have med effect. Unable to rule out IBS, doubt microscopic colitis, has risk factors for pancreatic insufficiency. Check celiac serologies due to IDA and TSH. Will start with dicyclomine QID. Await colonoscopy findings and labs to determine further management.       PLAN   TSH and celiac serologies today  Proceed with colonoscopy with random colonic biopsies and EGD with dilation, consider small bowel biopsies, by Dr. Rourk in near future: the risks, benefits, and alternatives have been discussed with the patient in detail. The patient states understanding and desires to proceed. ASA 3  Continue omeprazole daily  Trial of dicyclomine QID. Recommend drug holiday off metformin in future. Consider pancreatic enzymes   US elastography  Follow-up after procedures   Jerrye Seebeck W. Akirah Storck, PhD, ANP-BC [facility]    

## 2023-02-11 NOTE — Telephone Encounter (Signed)
Inhaler was sent in. [facility]

## 2023-02-11 NOTE — Telephone Encounter (Signed)
[employer] PA: Approved. # [id number] DOS: 02/19/2023-05/20/2023

## 2023-02-11 NOTE — Progress Notes (Signed)
Gastroenterology Office Note    Referring Provider: Ivonne Andrew, NP Primary Care Physician:  Ivonne Andrew, NP  Primary GI: Dr. Jena Gauss    Chief Complaint   Chief Complaint  Patient presents with   New Patient (Initial Visit)     History of Present Illness   Karene Schnittker is a 52 y.o. female presenting today at the request of Angus Seller, NP, to establish care. No prior colonoscopy or upper endoscopy.  She has been followed by Hematology due to anemia with chronic disease and IDA component. Ferritin a few weeks ago down to 17, Hgb 9.9. She is on oral iron daily. She also had an US abdomen complete ordered by PCP due to bloating and found to have heterogeneous echogenicity of liver with suggestion of lobular contours and left liver hypertrophy, splenomegaly.    Black stool on iron. No hematochezia. Solid food dysphagia for years. No significant odynophagia. +GERD on omeprazole daily   Diarrhea: since on metformin. Has been on metformin 8 or 9 years. Will have 12 loose stools per day, postprandially. Urgency. Stools used to be 4-5 tmes per day. Now constantly going. Imodium has not helped OTC taking BID. Never has soft stool. Stool looks slimy at times.   +weight loss. Used to weigh about 220 lbs about 9 years ago and has had slow steady weight loss. Will have lower abdominal cramping all the time, worsened with BM.    No ETOH use. No drug use. +smoke 1.5 ppd for 35 years.   Unknown GI family history. Maternal grandmother: ovarian cancer metastatic. Brother: cirrhosis of liver. History of ETOH. Father: CVA  Hep C antibody negative in the past. Normal LFTs.   Past Medical History:  Diagnosis Date   Bilateral lower extremity edema    CHF (congestive heart failure) (HCC)    COPD (chronic obstructive pulmonary disease) (HCC)    Cough    Diabetes mellitus without complication (HCC)    Gallstones    Heavy cigarette smoker    Hemoglobin A1C between 7% and 9% indicating  borderline diabetic control (HCC) 08/2019   Hypercholesteremia    Hyperglycemia    Hypertension    Shortness of breath    Stroke (cerebrum) (HCC)    Vitamin D deficiency 12/2020    Past Surgical History:  Procedure Laterality Date   TUBAL LIGATION      Current Outpatient Medications  Medication Sig Dispense Refill   albuterol (PROVENTIL) (2.5 MG/3ML) 0.083% nebulizer solution Take 3 mLs (2.5 mg total) by nebulization every 4 (four) hours as needed for wheezing or shortness of breath. 75 mL 2   albuterol (VENTOLIN HFA) 108 (90 Base) MCG/ACT inhaler Inhale 2 puffs into the lungs every 6 (six) hours as needed for wheezing or shortness of breath. 1 each 11   aspirin EC 81 MG tablet Take 1 tablet (81 mg total) by mouth daily with breakfast. Swallow whole. 30 tablet 12   atorvastatin (LIPITOR) 10 MG tablet Take 1 tablet (10 mg total) by mouth daily. 90 tablet 3   blood glucose meter kit and supplies KIT Dispense based on patient and insurance preference. Use up to four times daily as directed. 1 each 1   Continuous Glucose Sensor (FREESTYLE LIBRE 3 SENSOR) MISC Place 1 sensor on the skin every 14 days. Use to check glucose continuously 6 each 1   dicyclomine (BENTYL) 10 MG capsule Take 1 capsule (10 mg total) by mouth 4 (four) times daily -  before meals  and at bedtime. 120 capsule 3   Fluticasone-Umeclidin-Vilant (TRELEGY ELLIPTA) 200-62.5-25 MCG/ACT AEPB Inhale 1 puff into the lungs daily in the afternoon. 60 each 5   folic acid (FOLVITE) 800 MCG tablet Take 1 tablet (800 mcg total) by mouth daily. 30 tablet 2   furosemide (LASIX) 40 MG tablet Take 1.5 tablets (60 mg total) by mouth daily. 135 tablet 0   insulin glargine (LANTUS) 100 UNIT/ML Solostar Pen Inject 28 Units into the skin daily. 15 mL 1   lisinopril (ZESTRIL) 40 MG tablet Take 1 tablet (40 mg total) by mouth daily. 30 tablet 3   metFORMIN (GLUCOPHAGE-XR) 500 MG 24 hr tablet Take 2 tablets (1,000 mg total) by mouth 2 (two) times  daily with a meal. 360 tablet 1   metoprolol tartrate (LOPRESSOR) 25 MG tablet Take 1 tablet (25 mg total) by mouth 2 (two) times daily. 180 tablet 3   Nebulizers (COMPRESSOR/NEBULIZER) MISC 1 Units by Does not apply route as needed. 1 each 0   omeprazole (PRILOSEC) 20 MG capsule Take 1 capsule (20 mg total) by mouth daily. 90 capsule 1   SV IRON 325 (65 Fe) MG tablet Take 1 tablet by mouth once daily 90 tablet 0   empagliflozin (JARDIANCE) 10 MG TABS tablet Take 1 tablet (10 mg total) by mouth daily before breakfast. (Patient not taking: Reported on 02/11/2023) 90 tablet 1   No current facility-administered medications for this visit.    Allergies as of 02/11/2023   (No Known Allergies)    Family History  Problem Relation Age of Onset   Diabetes Mother    Uterine cancer Mother    Diabetes Father    Heart attack Father 35   Stroke Father    Stroke Sister    Cirrhosis Brother        alcohol   Ovarian cancer Maternal Grandmother    Heart attack Paternal Grandmother    Heart attack Paternal Grandfather    Heart attack Paternal Uncle    Bone cancer Paternal Uncle    Colon cancer Neg Hx        unknown   Colon polyps Neg Hx        unknown    Social History   Socioeconomic History   Marital status: Legally Separated    Spouse name: Not on file   Number of children: 5   Years of education: Not on file   Highest education level: Not on file  Occupational History   Not on file  Tobacco Use   Smoking status: Every Day    Packs/day: 1.00    Years: 32.00    Additional pack years: 0.00    Total pack years: 32.00    Types: Cigarettes   Smokeless tobacco: Never   Tobacco comments:    Since age 3.  Vaping Use   Vaping Use: Never used  Substance and Sexual Activity   Alcohol use: Not Currently    Comment: Occasional   Drug use: No   Sexual activity: Yes    Partners: Male  Other Topics Concern   Not on file  Social History Narrative   Not on file   Social Determinants  of Health   Financial Resource Strain: Not on file  Food Insecurity: No Food Insecurity (12/20/2022)   Hunger Vital Sign    Worried About Running Out of Food in the Last Year: Never true    Ran Out of Food in the Last Year: Never true  Transportation Needs: No  Transportation Needs (12/20/2022)   PRAPARE - Administrator, Civil Service (Medical): No    Lack of Transportation (Non-Medical): No  Physical Activity: Not on file  Stress: Not on file  Social Connections: Not on file  Intimate Partner Violence: Not At Risk (12/20/2022)   Humiliation, Afraid, Rape, and Kick questionnaire    Fear of Current or Ex-Partner: No    Emotionally Abused: No    Physically Abused: No    Sexually Abused: No     Review of Systems   Gen: Denies any fever, chills, fatigue, weight loss, lack of appetite.  CV: Denies chest pain, heart palpitations, peripheral edema, syncope.  Resp: +DOE GI: Denies dysphagia or odynophagia. Denies jaundice, hematemesis, fecal incontinence. GU : Denies urinary burning, urinary frequency, urinary hesitancy MS: Denies joint pain, muscle weakness, cramps, or limitation of movement.  Derm: Denies rash, itching, dry skin Psych: Denies depression, anxiety, memory loss, and confusion Heme: Denies bruising, bleeding, and enlarged lymph nodes.   Physical Exam   BP (!) 173/67 (BP Location: Right Arm, Patient Position: Sitting, Cuff Size: Normal)   Pulse 86   Temp (!) 97.3 F (36.3 C) (Temporal)   Ht 5\' 7"  (1.702 m)   Wt 157 lb 9.6 oz (71.5 kg)   SpO2 96%   BMI 24.68 kg/m  General:   Alert and oriented. Pleasant and cooperative. Well-nourished and well-developed.  Head:  Normocephalic and atraumatic. Eyes:  Without icterus Ears:  Normal auditory acuity. Lungs:  Clear to auscultation bilaterally.  Heart:  S1, S2 present without murmurs appreciated.  Abdomen:  +BS, soft, mild TTP RLQ , liver margin palpable across midline and non-distended.  No guarding or  rebound. No masses appreciated.  Rectal:  Deferred  Msk:  Symmetrical without gross deformities. Normal posture. Extremities:  Without edema. Neurologic:  Alert and  oriented x4;  grossly normal neurologically. Skin:  Intact without significant lesions or rashes. Psych:  Alert and cooperative. Normal mood and affect.  Lab Results  Component Value Date   WBC 4.1 01/20/2023   HGB 9.9 (L) 01/20/2023   HCT 31.5 (L) 01/20/2023   HCT 31.8 (L) 01/20/2023   MCV 84.4 01/20/2023   PLT 201 01/20/2023   Lab Results  Component Value Date   IRON 46 01/20/2023   TIBC 372 01/20/2023   FERRITIN 17 01/20/2023   Lab Results  Component Value Date   ALT 11 01/20/2023   AST 15 01/20/2023   ALKPHOS 74 01/20/2023   BILITOT 0.3 01/20/2023   Lab Results  Component Value Date   CREATININE 0.89 01/20/2023   BUN 12 01/20/2023   NA 139 01/20/2023   K 3.4 (L) 01/20/2023   CL 102 01/20/2023   CO2 27 01/20/2023      Assessment   Johnay Stott is a 52 y.o. female presenting today at the request of Angus Seller, NP, to establish care as a new patient. She has a history of anemia with chronic disease and IDA component, and she was recently found to have lobular contours of liver and splenomegaly on ultrasound.   Anemia: chronic disease and IDA component. No overt GI bleeding. No prior colonoscopy or EGD. Recent ferritin 17. Hgb around baseline of 9.9. Continues on oral iron. Followed by Hematology. Needs colonoscopy and EGD. No FH colon cancer or polyps that is known.  Lobular contours of liver: with splenomegaly. Normal platelets. May have underlying hepatic steatosis. No ETOH use. Hep C negative in the past. Will order elastography to  further characterize. I doubt dealing with advanced liver disease at this time.   Dysphagia: chronically noted. No prior dilation. Will add dilation at time of EGD.  GERD: Continue omeprazole daily.  Chronic diarrhea: noted for 8-9 years. Chronically on metformin  and feels this has contributed. Suspect could have med effect. Unable to rule out IBS, doubt microscopic colitis, has risk factors for pancreatic insufficiency. Check celiac serologies due to IDA and TSH. Will start with dicyclomine QID. Await colonoscopy findings and labs to determine further management.       PLAN   TSH and celiac serologies today  Proceed with colonoscopy with random colonic biopsies and EGD with dilation, consider small bowel biopsies, by Dr. Jena Gauss in near future: the risks, benefits, and alternatives have been discussed with the patient in detail. The patient states understanding and desires to proceed. ASA 3  Continue omeprazole daily  Trial of dicyclomine QID. Recommend drug holiday off metformin in future. Consider pancreatic enzymes   [place] elastography  Follow-up after procedures   Gelene Mink, PhD, ANP-BC [facility]

## 2023-02-11 NOTE — Patient Instructions (Signed)
Please have blood work done.  We are arranging a special ultrasound of your liver!  I have sent in dicyclomine to start taking once before meals and at bedtime, up to 4 times per day. This can cause dry mouth, dizziness, constipation, confusion. Please stop if that occurs and call us.   We are arranging a colonoscopy, upper endoscopy, and dilation in the near future with Dr. Jena Gauss!  You will need to stop iron 7 days prior. If you start taking Jardiance, stop this 72 hours before the procedure. No metformin the day of the procedure.  We will see you in follow-up after the procedures!  It was a pleasure to see you today. I want to create trusting relationships with patients and provide genuine, compassionate, and quality care. I truly value your feedback, so please be on the lookout for a survey regarding your visit with me today. I appreciate your time in completing this!    Gelene Mink, PhD, ANP-BC [facility]

## 2023-02-11 NOTE — Telephone Encounter (Signed)
[employer] PA: PENDED Disposition pending review Tracking #: [id number]

## 2023-02-12 ENCOUNTER — Other Ambulatory Visit: Payer: Medicaid Other | Admitting: Pharmacist

## 2023-02-12 DIAGNOSIS — I10 Essential (primary) hypertension: Secondary | ICD-10-CM

## 2023-02-12 DIAGNOSIS — Z760 Encounter for issue of repeat prescription: Secondary | ICD-10-CM

## 2023-02-12 LAB — TISSUE TRANSGLUTAMINASE, IGA: Tissue Transglutaminase Ab, IgA: <2 U/mL (ref 0–3)

## 2023-02-12 NOTE — Progress Notes (Unsigned)
Care Coordination Call  Contacted patient in response to MyChart message. They note that GI discussed discontinuation of metformin in case it relates to diarrhea. Patient notes there has been on change in diarrhea with changing from metformin XR to metformin IR, though she notes she has only been on it for a week.   Recommend to reduce metformin XR to 500 mg twice daily and see if any improvement in diarrhea.   Patient notes she is due for a refill on lisinopril and never received Jardiance or albuterol nebulizers. Will discuss with

## 2023-02-13 LAB — IGA: IgA: 165 mg/dL (ref 87–352)

## 2023-02-13 MED ORDER — ALBUTEROL SULFATE (2.5 MG/3ML) 0.083% IN NEBU: 2.5000 mg | INHALATION_SOLUTION | RESPIRATORY_TRACT | 2 refills | Status: DC | PRN

## 2023-02-13 MED ORDER — LISINOPRIL 40 MG PO TABS
40.0000 mg | ORAL_TABLET | Freq: Every day | ORAL | 1 refills | Status: DC
Start: 2023-02-13 — End: 2023-09-22

## 2023-02-13 MED ORDER — EMPAGLIFLOZIN 10 MG PO TABS: 10.0000 mg | ORAL_TABLET | Freq: Every day | ORAL | 1 refills | Status: DC

## 2023-02-13 MED ORDER — METFORMIN HCL ER 500 MG PO TB24: 500.0000 mg | ORAL_TABLET | Freq: Two times a day (BID) | ORAL | 1 refills | Status: DC

## 2023-02-14 ENCOUNTER — Ambulatory Visit: Payer: Self-pay

## 2023-02-14 ENCOUNTER — Ambulatory Visit (HOSPITAL_COMMUNITY)
Admission: RE | Admit: 2023-02-14 | Discharge: 2023-02-14 | Disposition: A | Discharge status: Home / Self Care | Payer: Medicaid Other | Source: Ambulatory Visit | Attending: Nurse Practitioner | Admitting: Nurse Practitioner

## 2023-02-14 DIAGNOSIS — J90 Pleural effusion, not elsewhere classified: Secondary | ICD-10-CM | POA: Diagnosis not present

## 2023-02-14 DIAGNOSIS — D649 Anemia, unspecified: Secondary | ICD-10-CM | POA: Insufficient documentation

## 2023-02-14 DIAGNOSIS — J432 Centrilobular emphysema: Secondary | ICD-10-CM | POA: Diagnosis not present

## 2023-02-17 ENCOUNTER — Ambulatory Visit: Payer: Medicaid Other | Attending: Internal Medicine

## 2023-02-17 DIAGNOSIS — I739 Peripheral vascular disease, unspecified: Secondary | ICD-10-CM

## 2023-02-18 ENCOUNTER — Encounter (HOSPITAL_COMMUNITY)
Admission: RE | Admit: 2023-02-18 | Discharge: 2023-02-18 | Disposition: A | Discharge status: Home / Self Care | Payer: Medicaid Other | Source: Ambulatory Visit | Attending: Internal Medicine | Admitting: Internal Medicine

## 2023-02-18 ENCOUNTER — Other Ambulatory Visit: Payer: Self-pay

## 2023-02-18 ENCOUNTER — Encounter (HOSPITAL_COMMUNITY): Payer: Self-pay

## 2023-02-18 VITALS — Ht 67.0 in | Wt 157.6 lb

## 2023-02-18 DIAGNOSIS — Z1211 Encounter for screening for malignant neoplasm of colon: Secondary | ICD-10-CM

## 2023-02-18 DIAGNOSIS — E118 Type 2 diabetes mellitus with unspecified complications: Secondary | ICD-10-CM

## 2023-02-18 HISTORY — DX: Gastro-esophageal reflux disease without esophagitis: K21.9

## 2023-02-18 LAB — VAS US ABI WITH/WO TBI
Left ABI: 0.76
Right ABI: 0.89

## 2023-02-19 ENCOUNTER — Ambulatory Visit (HOSPITAL_COMMUNITY)
Admission: RE | Admit: 2023-02-19 | Discharge: 2023-02-19 | Disposition: A | Discharge status: Home / Self Care | Payer: Medicaid Other | Attending: Internal Medicine | Admitting: Internal Medicine

## 2023-02-19 ENCOUNTER — Inpatient Hospital Stay: Payer: Medicaid Other

## 2023-02-19 ENCOUNTER — Encounter (HOSPITAL_COMMUNITY)
Admission: RE | Disposition: A | Discharge status: Home / Self Care | Payer: Self-pay | Source: Home / Self Care | Attending: Internal Medicine

## 2023-02-19 ENCOUNTER — Inpatient Hospital Stay: Payer: Medicaid Other | Admitting: Nurse Practitioner

## 2023-02-19 ENCOUNTER — Encounter (HOSPITAL_COMMUNITY): Payer: Self-pay | Admitting: Internal Medicine

## 2023-02-19 ENCOUNTER — Ambulatory Visit (HOSPITAL_BASED_OUTPATIENT_CLINIC_OR_DEPARTMENT_OTHER): Payer: Medicaid Other | Admitting: Anesthesiology

## 2023-02-19 ENCOUNTER — Ambulatory Visit (HOSPITAL_COMMUNITY): Payer: Medicaid Other | Admitting: Anesthesiology

## 2023-02-19 DIAGNOSIS — Z1211 Encounter for screening for malignant neoplasm of colon: Secondary | ICD-10-CM | POA: Insufficient documentation

## 2023-02-19 DIAGNOSIS — D509 Iron deficiency anemia, unspecified: Secondary | ICD-10-CM

## 2023-02-19 DIAGNOSIS — D638 Anemia in other chronic diseases classified elsewhere: Secondary | ICD-10-CM | POA: Insufficient documentation

## 2023-02-19 DIAGNOSIS — Z8041 Family history of malignant neoplasm of ovary: Secondary | ICD-10-CM | POA: Diagnosis not present

## 2023-02-19 DIAGNOSIS — R1310 Dysphagia, unspecified: Secondary | ICD-10-CM | POA: Diagnosis not present

## 2023-02-19 DIAGNOSIS — I509 Heart failure, unspecified: Secondary | ICD-10-CM | POA: Insufficient documentation

## 2023-02-19 DIAGNOSIS — Z794 Long term (current) use of insulin: Secondary | ICD-10-CM | POA: Diagnosis not present

## 2023-02-19 DIAGNOSIS — E119 Type 2 diabetes mellitus without complications: Secondary | ICD-10-CM | POA: Insufficient documentation

## 2023-02-19 DIAGNOSIS — K219 Gastro-esophageal reflux disease without esophagitis: Secondary | ICD-10-CM | POA: Insufficient documentation

## 2023-02-19 DIAGNOSIS — F17210 Nicotine dependence, cigarettes, uncomplicated: Secondary | ICD-10-CM | POA: Insufficient documentation

## 2023-02-19 DIAGNOSIS — I69354 Hemiplegia and hemiparesis following cerebral infarction affecting left non-dominant side: Secondary | ICD-10-CM | POA: Insufficient documentation

## 2023-02-19 DIAGNOSIS — I110 Hypertensive heart disease with heart failure: Secondary | ICD-10-CM | POA: Insufficient documentation

## 2023-02-19 DIAGNOSIS — Z7984 Long term (current) use of oral hypoglycemic drugs: Secondary | ICD-10-CM | POA: Diagnosis not present

## 2023-02-19 DIAGNOSIS — K222 Esophageal obstruction: Secondary | ICD-10-CM | POA: Diagnosis not present

## 2023-02-19 DIAGNOSIS — J449 Chronic obstructive pulmonary disease, unspecified: Secondary | ICD-10-CM | POA: Diagnosis not present

## 2023-02-19 DIAGNOSIS — K529 Noninfective gastroenteritis and colitis, unspecified: Secondary | ICD-10-CM | POA: Diagnosis not present

## 2023-02-19 DIAGNOSIS — Z823 Family history of stroke: Secondary | ICD-10-CM | POA: Diagnosis not present

## 2023-02-19 DIAGNOSIS — Z79899 Other long term (current) drug therapy: Secondary | ICD-10-CM | POA: Insufficient documentation

## 2023-02-19 DIAGNOSIS — R197 Diarrhea, unspecified: Secondary | ICD-10-CM

## 2023-02-19 HISTORY — PX: BIOPSY: SHX5522

## 2023-02-19 HISTORY — PX: COLONOSCOPY WITH PROPOFOL: SHX5780

## 2023-02-19 HISTORY — PX: MALONEY DILATION: SHX5535

## 2023-02-19 HISTORY — PX: ESOPHAGOGASTRODUODENOSCOPY (EGD) WITH PROPOFOL: SHX5813

## 2023-02-19 LAB — GLUCOSE, CAPILLARY: Glucose-Capillary: 225 mg/dL — ABNORMAL HIGH (ref 70–99)

## 2023-02-19 SURGERY — COLONOSCOPY WITH PROPOFOL
Anesthesia: General

## 2023-02-19 MED ORDER — LIDOCAINE HCL (PF) 2 % IJ SOLN
INTRAMUSCULAR | Status: AC
  Filled 2023-02-19: qty 5

## 2023-02-19 MED ORDER — PROPOFOL 10 MG/ML IV BOLUS
INTRAVENOUS | Status: DC | PRN
  Administered 2023-02-19: 80 mg via INTRAVENOUS

## 2023-02-19 MED ORDER — PROPOFOL 500 MG/50ML IV EMUL
INTRAVENOUS | Status: AC
  Filled 2023-02-19: qty 50

## 2023-02-19 MED ORDER — PROPOFOL 500 MG/50ML IV EMUL
INTRAVENOUS | Status: DC | PRN
  Administered 2023-02-19: 180 ug/kg/min via INTRAVENOUS

## 2023-02-19 MED ORDER — LACTATED RINGERS IV SOLN: INTRAVENOUS | Status: DC

## 2023-02-19 MED ORDER — LIDOCAINE HCL (PF) 2 % IJ SOLN
INTRAMUSCULAR | Status: DC | PRN
  Administered 2023-02-19: 50 mg via INTRADERMAL

## 2023-02-19 MED ORDER — STERILE WATER FOR IRRIGATION IR SOLN
Status: DC | PRN
  Administered 2023-02-19: 240 mL

## 2023-02-19 NOTE — Transfer of Care (Signed)
Immediate Anesthesia Transfer of Care Note  Patient: Misty Mccullough  Procedure(s) Performed: COLONOSCOPY WITH PROPOFOL ESOPHAGOGASTRODUODENOSCOPY (EGD) WITH PROPOFOL MALONEY DILATION BIOPSY  Patient Location: Short Stay  Anesthesia Type:General  Level of Consciousness: awake, alert , and oriented  Airway & Oxygen Therapy: Patient Spontanous Breathing  Post-op Assessment: Report given to RN, Post -op Vital signs reviewed and stable, Patient moving all extremities X 4, and Patient able to stick tongue midline  Post vital signs: Reviewed  Last Vitals:  Vitals Value Taken Time  BP 133/52   Temp 96.9   Pulse 83   Resp 21   SpO2 94     Last Pain:  Vitals:   02/19/23 0815  TempSrc:   PainSc: 0-No pain         Complications: No notable events documented.

## 2023-02-19 NOTE — Anesthesia Preprocedure Evaluation (Addendum)
Anesthesia Evaluation  Patient identified by MRN, date of birth, ID band Patient awake    Reviewed: Allergy & Precautions, H&P , NPO status , Patient's Chart, lab work & pertinent test results, reviewed documented beta blocker date and time   Airway Mallampati: II  TM Distance: >3 FB Neck ROM: Full    Dental  (+) Edentulous Upper, Edentulous Lower   Pulmonary shortness of breath and with exertion, pneumonia, COPD,  COPD inhaler, Current Smoker and Patient abstained from smoking.   Pulmonary exam normal breath sounds clear to auscultation       Cardiovascular Exercise Tolerance: Poor hypertension, Pt. on medications and Pt. on home beta blockers +CHF and + DOE  Normal cardiovascular exam Rhythm:Regular Rate:Normal  1. Left ventricular ejection fraction, by estimation, is 55 to 60%. The  left ventricle has normal function. The left ventricle has no regional  wall motion abnormalities. There is mild left ventricular hypertrophy.  Left ventricular diastolic parameters  are consistent with Grade I diastolic dysfunction (impaired relaxation).  Elevated left atrial pressure.   2. Right ventricular systolic function is normal. The right ventricular  size is normal. Tricuspid regurgitation signal is inadequate for assessing  PA pressure.   3. Left atrial size was mildly dilated.   4. The mitral valve is abnormal. Mild mitral valve regurgitation. No  evidence of mitral stenosis.   5. The aortic valve is tricuspid. There is mild calcification of the  aortic valve. There is mild thickening of the aortic valve. Aortic valve  regurgitation is not visualized. No aortic stenosis is present.   6. The inferior vena cava is normal in size with <50% respiratory  variability, suggesting right atrial pressure of 8 mmHg.     Neuro/Psych  PSYCHIATRIC DISORDERS Anxiety     CVA (Left sided weakness), Residual Symptoms    GI/Hepatic Neg liver ROS,GERD   Medicated and Controlled,,  Endo/Other  diabetes, Well Controlled, Type 2, Oral Hypoglycemic Agents, Insulin Dependent    Renal/GU Renal disease  negative genitourinary   Musculoskeletal negative musculoskeletal ROS (+)    Abdominal   Peds negative pediatric ROS (+)  Hematology negative hematology ROS (+)   Anesthesia Other Findings   Reproductive/Obstetrics negative OB ROS                             Anesthesia Physical Anesthesia Plan  ASA: 3  Anesthesia Plan: General   Post-op Pain Management: Minimal or no pain anticipated   Induction: Intravenous  PONV Risk Score and Plan: 1 and Treatment may vary due to age or medical condition  Airway Management Planned: Nasal Cannula and Natural Airway  Additional Equipment:   Intra-op Plan:   Post-operative Plan:   Informed Consent: I have reviewed the patients History and Physical, chart, labs and discussed the procedure including the risks, benefits and alternatives for the proposed anesthesia with the patient or authorized representative who has indicated his/her understanding and acceptance.       Plan Discussed with: CRNA and Surgeon  Anesthesia Plan Comments:         Anesthesia Quick Evaluation

## 2023-02-19 NOTE — Discharge Instructions (Signed)
EGD Discharge instructions Please read the instructions outlined below and refer to this sheet in the next few weeks. These discharge instructions provide you with general information on caring for yourself after you leave the hospital. Your doctor may also give you specific instructions. While your treatment has been planned according to the most current medical practices available, unavoidable complications occasionally occur. If you have any problems or questions after discharge, please call your doctor. ACTIVITY You may resume your regular activity but move at a slower pace for the next 24 hours.  Take frequent rest periods for the next 24 hours.  Walking will help expel (get rid of) the air and reduce the bloated feeling in your abdomen.  No driving for 24 hours (because of the anesthesia (medicine) used during the test).  You may shower.  Do not sign any important legal documents or operate any machinery for 24 hours (because of the anesthesia used during the test).  NUTRITION Drink plenty of fluids.  You may resume your normal diet.  Begin with a light meal and progress to your normal diet.  Avoid alcoholic beverages for 24 hours or as instructed by your caregiver.  MEDICATIONS You may resume your normal medications unless your caregiver tells you otherwise.  WHAT YOU CAN EXPECT TODAY You may experience abdominal discomfort such as a feeling of fullness or "gas" pains.  FOLLOW-UP Your doctor will discuss the results of your test with you.  SEEK IMMEDIATE MEDICAL ATTENTION IF ANY OF THE FOLLOWING OCCUR: Excessive nausea (feeling sick to your stomach) and/or vomiting.  Severe abdominal pain and distention (swelling).  Trouble swallowing.  Temperature over 101 F (37.8 C).  Rectal bleeding or vomiting of blood.    Colonoscopy Discharge Instructions  Read the instructions outlined below and refer to this sheet in the next few weeks. These discharge instructions provide you with  general information on caring for yourself after you leave the hospital. Your doctor may also give you specific instructions. While your treatment has been planned according to the most current medical practices available, unavoidable complications occasionally occur. If you have any problems or questions after discharge, call Dr. Gala Romney at [phone]. ACTIVITY You may resume your regular activity, but move at a slower pace for the next 24 hours.  Take frequent rest periods for the next 24 hours.  Walking will help get rid of the air and reduce the bloated feeling in your belly (abdomen).  No driving for 24 hours (because of the medicine (anesthesia) used during the test).   Do not sign any important legal documents or operate any machinery for 24 hours (because of the anesthesia used during the test).  NUTRITION Drink plenty of fluids.  You may resume your normal diet as instructed by your doctor.  Begin with a light meal and progress to your normal diet. Heavy or fried foods are harder to digest and may make you feel sick to your stomach (nauseated).  Avoid alcoholic beverages for 24 hours or as instructed.  MEDICATIONS You may resume your normal medications unless your doctor tells you otherwise.  WHAT YOU CAN EXPECT TODAY Some feelings of bloating in the abdomen.  Passage of more gas than usual.  Spotting of blood in your stool or on the toilet paper.  IF YOU HAD POLYPS REMOVED DURING THE COLONOSCOPY: No aspirin products for 7 days or as instructed.  No alcohol for 7 days or as instructed.  Eat a soft diet for the next 24 hours.  FINDING  OUT THE RESULTS OF YOUR TEST Not all test results are available during your visit. If your test results are not back during the visit, make an appointment with your caregiver to find out the results. Do not assume everything is normal if you have not heard from your caregiver or the medical facility. It is important for you to follow up on all of your test  results.  SEEK IMMEDIATE MEDICAL ATTENTION IF: You have more than a spotting of blood in your stool.  Your belly is swollen (abdominal distention).  You are nauseated or vomiting.  You have a temperature over 101.  You have abdominal pain or discomfort that is severe or gets worse throughout the day.     Your esophagus was dilated today.  Stomach inflamed biopsies taken   your colon appeared normal.  Biopsies taken to evaluate diarrhea.    Further recommendations to follow pending review of pathology report   recommend repeat screening colonoscopy in 10 years  Office visit with Lewie Loron in 3 months  Stop Prilosec; begin Protonix 40 mg pill take 30 minutes before breakfast.  This will help control your acid reflux better.  A new prescription has been provided to your  pharmacy through the office.  At patient request, I called Mardene Sayer at [phone] findings and recommendations

## 2023-02-19 NOTE — Op Note (Signed)
[facility] Patient Name: Misty Mccullough Procedure Date: 02/19/2023 8:02 AM MRN: [id number] Date of Birth: Feb 08, 1971 Attending MD: Gennette Pac , MD, [id number] CSN: [id number] Age: 52 Admit Type: Outpatient Procedure:                Colonoscopy Indications:              Screening for colorectal malignant neoplasm Providers:                Gennette Pac, MD, Edrick Kins, RN, Pandora Leiter, Technician Referring MD:              Medicines:                Propofol per Anesthesia Complications:            No immediate complications. Estimated Blood Loss:     Estimated blood loss was minimal. Estimated blood                            loss was minimal. Procedure:                Pre-Anesthesia Assessment:                           - Prior to the procedure, a History and Physical                            was performed, and patient medications and                            allergies were reviewed. The patient's tolerance of                            previous anesthesia was also reviewed. The risks                            and benefits of the procedure and the sedation                            options and risks were discussed with the patient.                            All questions were answered, and informed consent                            was obtained. Prior Anticoagulants: The patient has                            taken no anticoagulant or antiplatelet agents. ASA                            Grade Assessment: III - A patient with severe  systemic disease. After reviewing the risks and                            benefits, the patient was deemed in satisfactory                            condition to undergo the procedure.                           After obtaining informed consent, the colonoscope                            was passed under direct vision. Throughout the                            procedure,  the patient's blood pressure, pulse, and                            oxygen saturations were monitored continuously. The                            [id number]) scope was introduced through the                            anus and advanced to the the cecum, identified by                            appendiceal orifice and ileocecal valve. The                            colonoscopy was performed without difficulty. The                            patient tolerated the procedure well. The quality                            of the bowel preparation was adequate. The                            ileocecal valve, appendiceal orifice, and rectum                            were photographed. The colonoscopy was performed                            without difficulty. The patient tolerated the                            procedure well. The quality of the bowel                            preparation was adequate. Scope In: 8:34:10 AM Scope Out: 8:47:15 AM Scope Withdrawal Time: 0 hours 7 minutes 48 seconds  Total Procedure Duration: 0 hours 13 minutes 5 seconds  Findings:      The perianal and digital rectal examinations were normal.      The colon (entire examined portion) appeared normal.      The retroflexed view of the distal rectum and anal verge was normal and       showed no anal or rectal abnormalities. Biopsies of the right and left       colon taken for histologic study due to complaining of chronic diarrhea. Impression:               - The entire examined colon is normal.                           - The distal rectum and anal verge are normal on                            retroflexion view.                           -Status post segmental biopsy. Moderate Sedation:      Moderate (conscious) sedation was personally administered by an       anesthesia professional. The following parameters were monitored: oxygen       saturation, heart rate, blood pressure, respiratory rate, EKG,  adequacy       of pulmonary ventilation, and response to care. Recommendation:           - Patient has a contact number available for                            emergencies. The signs and symptoms of potential                            delayed complications were discussed with the                            patient. Return to normal activities tomorrow.                            Written discharge instructions were provided to the                            patient.                           - Resume previous diet.                           - Continue present medications.                           - Repeat colonoscopy in 10 years for screening                            purposes.                           - Return to GI office in 3 months. See EGD report. Procedure Code(s):        ---  Professional ---                           [phone], Colonoscopy, flexible; diagnostic, including                            collection of specimen(s) by brushing or washing,                            when performed (separate procedure) Diagnosis Code(s):        --- Professional ---                           Z12.11, Encounter for screening for malignant                            neoplasm of colon CPT copyright 2022 [employer]. All rights reserved. The codes documented in this report are preliminary and upon coder review may  be revised to meet current compliance requirements. Gerrit Friends. Penelope Fittro, MD Gennette Pac, MD 02/19/2023 8:59:15 AM This report has been signed electronically. Number of Addenda: 0

## 2023-02-19 NOTE — Op Note (Signed)
[facility] Patient Name: Misty Mccullough Procedure Date: 02/19/2023 8:03 AM MRN: [id number] Date of Birth: July 02, 1971 Attending MD: Gennette Pac , MD, [id number] CSN: [id number] Age: 52 Admit Type: Outpatient Procedure:                Upper GI endoscopy Indications:              Dysphagia Providers:                Gennette Pac, MD, Edrick Kins, RN, Pandora Leiter, Technician Referring MD:              Medicines:                Propofol per Anesthesia Complications:            No immediate complications. Estimated Blood Loss:     Estimated blood loss was minimal. Procedure:                Pre-Anesthesia Assessment:                           - Prior to the procedure, a History and Physical                            was performed, and patient medications and                            allergies were reviewed. The patient's tolerance of                            previous anesthesia was also reviewed. The risks                            and benefits of the procedure and the sedation                            options and risks were discussed with the patient.                            All questions were answered, and informed consent                            was obtained. Prior Anticoagulants: The patient has                            taken no anticoagulant or antiplatelet agents. ASA                            Grade Assessment: III - A patient with severe                            systemic disease. After reviewing the risks and  benefits, the patient was deemed in satisfactory                            condition to undergo the procedure.                           After obtaining informed consent, the endoscope was                            passed under direct vision. Throughout the                            procedure, the patient's blood pressure, pulse, and                            oxygen saturations  were monitored continuously. The                            [id number] ([id number]) scope was introduced through the                            mouth, and advanced to the second part of duodenum.                            The upper GI endoscopy was accomplished without                            difficulty. The patient tolerated the procedure                            well. Scope In: 8:19:29 AM Scope Out: 8:27:31 AM Total Procedure Duration: 0 hours 8 minutes 2 seconds  Findings:      A mild Schatzki ring was found at the gastroesophageal junction. No       esophagitis. No mass. No Barrett's epithelium. Gastric cavity empty.       Scattered antral erosions. No infiltrating process or ulcer seen pylorus       patent.      The duodenal bulb and second portion of the duodenum were normal. The       scope was withdrawn. Dilation was performed with a Maloney dilator with       mild resistance at 54 Fr. The scope was withdrawn. Dilation was       performed with a Maloney dilator with mild resistance at 56 Fr. The       dilation site was examined following endoscope reinsertion and showed       complete resolution of luminal narrowing. Estimated blood loss: Minimal.. Impression:               - Mild Schatzki ring. Dilated. Gastric                            erosions?"status post biopsy                           - Normal duodenal bulb and second portion of the  duodenum.                           - Moderate Sedation:      Moderate (conscious) sedation was personally administered by an       anesthesia professional. The following parameters were monitored: oxygen       saturation, heart rate, blood pressure, respiratory rate, EKG, adequacy       of pulmonary ventilation, and response to care. Recommendation:           - Patient has a contact number available for                            emergencies. The signs and symptoms of potential                             delayed complications were discussed with the                            patient. Return to normal activities tomorrow.                            Written discharge instructions were provided to the                            patient.                           - Advance diet as tolerated.                           - Continue present medications. However, stop                            Prilosec; begin Protonix 40 mg once daily 30                            minutes before breakfast for better control of                            reflux. New prescription provided through the                            office. See colonoscopy report.                           - Return to my office in 3 months. Procedure Code(s):        --- Professional ---                           [phone], Esophagogastroduodenoscopy, flexible,                            transoral; diagnostic, including collection of                            specimen(s) by brushing  or washing, when performed                            (separate procedure)                           43450, Dilation of esophagus, by unguided sound or                            bougie, single or multiple passes Diagnosis Code(s):        --- Professional ---                           K22.2, Esophageal obstruction                           R13.10, Dysphagia, unspecified CPT copyright 2022 American [employer] Association. All rights reserved. The codes documented in this report are preliminary and upon coder review may  be revised to meet current compliance requirements. Gerrit Friends. Aeson Sawyers, MD Gennette Pac, MD 02/19/2023 8:56:27 AM This report has been signed electronically. Number of Addenda: 0

## 2023-02-19 NOTE — Anesthesia Postprocedure Evaluation (Signed)
Anesthesia Post Note  Patient: Misty Mccullough  Procedure(s) Performed: COLONOSCOPY WITH PROPOFOL ESOPHAGOGASTRODUODENOSCOPY (EGD) WITH PROPOFOL MALONEY DILATION BIOPSY  Patient location during evaluation: Phase II Anesthesia Type: General Level of consciousness: awake and alert and oriented Pain management: pain level controlled Vital Signs Assessment: post-procedure vital signs reviewed and stable Respiratory status: spontaneous breathing, nonlabored ventilation and respiratory function stable Cardiovascular status: blood pressure returned to baseline and stable Postop Assessment: no apparent nausea or vomiting Anesthetic complications: no  No notable events documented.   Last Vitals:  Vitals:   02/19/23 0710 02/19/23 0852  BP: (!) 178/0 (!) 133/52  Pulse: 80 84  Resp: 14 (!) 21  Temp: (!) 36.4 C (!) 36.1 C  SpO2: 98% 94%    Last Pain:  Vitals:   02/19/23 0852  TempSrc: Axillary  PainSc: 0-No pain                 Misty Mccullough

## 2023-02-19 NOTE — Interval H&P Note (Signed)
History and Physical Interval Note:  02/19/2023 8:09 AM  Misty Mccullough  has presented today for surgery, with the diagnosis of IDA,dysphagia,diarrhea.  The various methods of treatment have been discussed with the patient and family. After consideration of risks, benefits and other options for treatment, the patient has consented to  Procedure(s) with comments: COLONOSCOPY WITH PROPOFOL (N/A) - 8:15 am, asa 3 ESOPHAGOGASTRODUODENOSCOPY (EGD) WITH PROPOFOL (N/A) MALONEY DILATION (N/A) as a surgical intervention.  The patient's history has been reviewed, patient examined, no change in status, stable for surgery.  I have reviewed the patient's chart and labs.  Questions were answered to the patient's satisfaction.     Martie Fulgham  No change.  Celiac markers negative.  EGD with possible esophageal dilation is feasible/appropriate today as well is a diagnostic colonoscopy per plan. The risks, benefits, limitations, imponderables and alternatives regarding both EGD and colonoscopy have been reviewed with the patient. Questions have been answered. All parties agreeable.

## 2023-02-20 ENCOUNTER — Telehealth: Payer: Self-pay

## 2023-02-20 ENCOUNTER — Telehealth: Payer: Self-pay | Admitting: Nurse Practitioner

## 2023-02-20 ENCOUNTER — Ambulatory Visit (INDEPENDENT_AMBULATORY_CARE_PROVIDER_SITE_OTHER): Payer: Medicaid Other | Admitting: Pharmacist

## 2023-02-20 VITALS — BP 180/72 | HR 77

## 2023-02-20 DIAGNOSIS — I10 Essential (primary) hypertension: Secondary | ICD-10-CM

## 2023-02-20 DIAGNOSIS — J42 Unspecified chronic bronchitis: Secondary | ICD-10-CM

## 2023-02-20 DIAGNOSIS — E119 Type 2 diabetes mellitus without complications: Secondary | ICD-10-CM

## 2023-02-20 DIAGNOSIS — I5032 Chronic diastolic (congestive) heart failure: Secondary | ICD-10-CM

## 2023-02-20 LAB — SURGICAL PATHOLOGY

## 2023-02-20 MED ORDER — CARVEDILOL 12.5 MG PO TABS: 12.5000 mg | ORAL_TABLET | Freq: Two times a day (BID) | ORAL | 3 refills | Status: DC

## 2023-02-20 MED ORDER — ATORVASTATIN CALCIUM 80 MG PO TABS: 80.0000 mg | ORAL_TABLET | Freq: Every day | ORAL | 3 refills | Status: DC

## 2023-02-20 MED ORDER — SPIRIVA RESPIMAT 2.5 MCG/ACT IN AERS: 2.0000 | INHALATION_SPRAY | Freq: Every day | RESPIRATORY_TRACT | 1 refills | Status: DC

## 2023-02-20 MED ORDER — FLUTICASONE-SALMETEROL 100-50 MCG/ACT IN AEPB: 1.0000 | INHALATION_SPRAY | Freq: Two times a day (BID) | RESPIRATORY_TRACT | 1 refills | Status: DC

## 2023-02-20 NOTE — Telephone Encounter (Signed)
-----   Message from Marjo Bicker, MD sent at 02/19/2023  7:18 PM EDT ----- There is evidence of poor circulation in lower extremities. Mild in right leg and moderate in left leg. Continue aspirin 81 mg once daily and increase atorvastatin from 10 mg to 80 mg QHS.

## 2023-02-20 NOTE — Progress Notes (Signed)
02/20/2023 Name: Misty Mccullough MRN: [id number] DOB: 17-Jun-1971  Chief Complaint  Patient presents with   Medication Management   Diabetes   Hypertension   Hyperlipidemia    Misty Mccullough is a 52 y.o. year old female who was referred for medication management by their primary care provider, Ivonne Andrew, NP. They presented for a face to face visit today.   They were referred to the pharmacist by their PCP for assistance in managing diabetes   Patient is participating in a Managed Medicaid Plan:  Yes  Subjective:  Care Team: Primary Care Provider: Ivonne Andrew, NP ; Next Scheduled Visit: 02/20/23  Medication Access/Adherence  Current Pharmacy:  [facility] [address], [place] - [address] [place] [address] Phone: [phone] Fax: [phone]   Patient reports affordability concerns with their medications: No  Patient reports access/transportation concerns to their pharmacy: No  Patient reports adherence concerns with their medications:  No     Diabetes:  Current medications: metformin XR 1000 mg twice daily; Jardiance 10 mg - to start tomorrow, had been holding until completion of endoscopy/colonoscopy  Current glucose readings: reports home fingersticks 250-260s  Patient denies hypoglycemic s/sx including dizziness, shakiness, sweating. Patient denies hyperglycemic symptoms including polyuria, polydipsia, polyphagia, nocturia, neuropaty, blurred vision.  Current meal patterns:  - Breakfast: biscuit and gravy; sometimes sourdough tomato sandwich;  - Lunch: sometimes skips; waits until she gets home - Supper: ham sandwich; - Drinks: diet   Hyperlipidemia/ASCVD Risk Reduction  Current lipid lowering medications: atorvastatin 40 mg - increased to 80 mg daily by cardiology today  Antiplatelet regimen: aspirin 81 mg daily  Heart Failure:  Current medications:  ACEi/ARB/ARNI: lisinopril 40 mg daily SGLT2i: Jardiance to start  today Beta blocker: metoprolol tartrate 25 mg twice daily Mineralocorticoid Receptor Antagonist: none Diuretic regimen: furosemide 60 mg daily  Current home blood pressure readings: systolic 140-150s  Reports she does have left sided rib pain; leaned into her washing machine to remove grass and hurt her left side. Has not improve over a week. She wonders if she cracked a rib. Asks if pain can impact blood pressure.   Denies chest pain, headache today. Reports she did take lisinopril and metoprolol this morning  Objective:  Lab Results  Component Value Date   HGBA1C 7.9 (A) 12/18/2022    Lab Results  Component Value Date   CREATININE 0.89 01/20/2023   BUN 12 01/20/2023   NA 139 01/20/2023   K 3.4 (L) 01/20/2023   CL 102 01/20/2023   CO2 27 01/20/2023    Lab Results  Component Value Date   CHOL 234 (H) 12/18/2022   HDL 57 12/18/2022   LDLCALC 167 (H) 12/18/2022   TRIG 62 12/18/2022   CHOLHDL 4.1 12/18/2022    Medications Reviewed Today     Reviewed by Alden Hipp, RPH-CPP (Pharmacist) on 02/20/23 at 0900  Med List Status: <None>   Medication Order Taking? Sig Documenting Provider Last Dose Status Informant  albuterol (PROVENTIL) (2.5 MG/3ML) 0.083% nebulizer solution [id number]  Take 3 mLs (2.5 mg total) by nebulization every 4 (four) hours as needed for wheezing or shortness of breath. Ivonne Andrew, NP  Active Self  albuterol (VENTOLIN HFA) 108 (90 Base) MCG/ACT inhaler [id number]  Inhale 2 puffs into the lungs every 6 (six) hours as needed for wheezing or shortness of breath. Ivonne Andrew, NP  Active Self  aspirin EC 81 MG tablet [id number] Yes Take 1 tablet (  81 mg total) by mouth daily with breakfast. Swallow whole. Shon Hale, MD Taking Active Self  Aspirin-Caffeine (BC FAST PAIN RELIEF ARTHRITIS PO) [id number] No Take 1 packet by mouth daily as needed (pain.).  Patient not taking: Reported on 02/20/2023   [provider] Not Taking Active  Self  atorvastatin (LIPITOR) 10 MG tablet [id number] Yes Take 1 tablet (10 mg total) by mouth daily. Ivonne Andrew, NP Taking Active Self  blood glucose meter kit and supplies KIT [id number]  Dispense based on patient and insurance preference. Use up to four times daily as directed. Orion Crook I, NP  Active Self  Continuous Glucose Sensor (FREESTYLE LIBRE 3 SENSOR) [place] [id number]  Place 1 sensor on the skin every 14 days. Use to check glucose continuously  Patient not taking: Reported on 02/12/2023   Ivonne Andrew, NP  Active Self  dicyclomine (BENTYL) 10 MG capsule [id number]  Take 1 capsule (10 mg total) by mouth 4 (four) times daily -  before meals and at bedtime. Gelene Mink, NP  Active Self  empagliflozin (JARDIANCE) 10 MG TABS tablet [id number] Yes Take 1 tablet (10 mg total) by mouth daily before breakfast. Ivonne Andrew, NP Taking Active Self           Med Note Clearance Coots, Alanda Slim Feb 20, 2023  8:58 AM)    folic acid (FOLVITE) 800 MCG tablet [id number] Yes Take 1 tablet (800 mcg total) by mouth daily. Ivonne Andrew, NP Taking Active Self  furosemide (LASIX) 40 MG tablet [id number] Yes Take 1.5 tablets (60 mg total) by mouth daily.  Patient taking differently: Take 60 mg by mouth daily at 12 noon.   Mallipeddi, Vishnu P, MD Taking Active Self  ibuprofen (ADVIL) 200 MG tablet [id number]  Take 800 mg by mouth every 8 (eight) hours as needed (pain.). [provider]  Active Self  insulin glargine (LANTUS) 100 UNIT/ML Solostar Pen [id number] Yes Inject 28 Units into the skin daily.  Patient taking differently: Inject 21 Units into the skin at bedtime.   Ivonne Andrew, NP Taking Active Self  lisinopril (ZESTRIL) 40 MG tablet [id number] Yes Take 1 tablet (40 mg total) by mouth daily. Ivonne Andrew, NP Taking Active Self  metFORMIN (GLUCOPHAGE-XR) 500 MG 24 hr tablet [id number] Yes Take 1 tablet (500 mg total) by mouth 2 (two) times daily with a meal.  Patient taking  differently: Take 1,000 mg by mouth 2 (two) times daily with a meal.   Ivonne Andrew, NP Taking Active Self           Med Note Clearance Coots, Alanda Slim Feb 20, 2023  8:58 AM)    metoprolol tartrate (LOPRESSOR) 25 MG tablet [id number] Yes Take 1 tablet (25 mg total) by mouth 2 (two) times daily. Mallipeddi, Orion Modest, MD Taking Active Self  Nebulizers (COMPRESSOR/NEBULIZER) MISC [id number]  1 Units by Does not apply route as needed. Shon Hale, MD  Active Self  omeprazole (PRILOSEC) 20 MG capsule [id number] No Take 1 capsule (20 mg total) by mouth daily.  Patient not taking: Reported on 02/20/2023   Ivonne Andrew, NP Not Taking Active Self  PAIN RELIEVING LIDOCAINE EX [id number] Yes Apply 1 Application topically daily. [provider] Taking Active Self  SV IRON 325 (65 Fe) MG tablet [id number] Yes Take 1 tablet by mouth once daily Ivonne Andrew, NP Taking Active Self  Med Note Farris Has Feb 17, 2023 10:31 AM) On hold due to upcoming procedure.              Assessment/Plan:   Diabetes: - Currently uncontrolled - Reiterated recommendation to reduce dose of metformin. Patient verbalized understanding. Continue plan to start Jardiance.  - Counseled on use of CGM. Patient placed sensor today; will follow glucose readings.   Hyperlipidemia/ASCVD Risk Reduction: - Currently uncontrolled, though had been off atorvastatin for several months prior to most recent lipid panel - Reiterated medication changes per cardiology. Patient verbalized understanding.  - Reviewed long term complications of uncontrolled cholesterol  Heart Failure: - Currently opportunities for optimization - Reviewed appropriate blood pressure monitoring technique and reviewed goal blood pressure - Collaborated with Dr. Jenene Slicker. Stop metoprolol, start carvedilol 12.5 mg twice daily. Monitor home BP readings, document, bring to future appointments.   Collaborated to schedule  appointment with office next week to evaluate rib pain   Follow Up Plan: phone call in 4 weeks  Catie Eppie Gibson, PharmD, BCACP, CPP [facility] [phone]

## 2023-02-20 NOTE — Telephone Encounter (Signed)
Contacted patient to scheduled appointments. Patient is aware of appointments that are scheduled.   

## 2023-02-20 NOTE — Patient Instructions (Addendum)
Misty Mccullough,   It was great talking to you today!  Decrease metformin to 2 tablets daily (1 twice daily) to see if this helps the diarrhea. Start Jardiance 10 mg daily in the morning.   Cardiology is increasing your atorvastatin to 80 mg daily. Continue aspirin 81 mg daily.   For blood pressure/heart, continue lisinopril 40 mg daily. Stop metoprolol, start carevedilol 12.5 mg twice daily.   Continue to check your blood pressure at home. Write down these readings and bring to future appointments.   You'll see Fola next week for your side pain.   Take care!  Catie Clearance Coots, PharmD

## 2023-02-20 NOTE — Telephone Encounter (Signed)
Patient notified and verbalized understanding. Patient had no questions or concerns at this time.  

## 2023-02-21 ENCOUNTER — Telehealth (HOSPITAL_COMMUNITY): Payer: Self-pay | Admitting: Emergency Medicine

## 2023-02-21 NOTE — Telephone Encounter (Signed)
Reaching out to patient to offer assistance regarding upcoming cardiac imaging study; pt verbalizes understanding of appt date/time, parking situation and where to check in, pre-test NPO status and medications ordered, and verified current allergies; name and call back number provided for further questions should they arise Andrewjames Weirauch RN Navigator Cardiac [facility] [phone] office [phone] cell 

## 2023-02-23 ENCOUNTER — Encounter: Payer: Self-pay | Admitting: Internal Medicine

## 2023-02-24 ENCOUNTER — Ambulatory Visit (HOSPITAL_COMMUNITY)
Admission: RE | Admit: 2023-02-24 | Discharge: 2023-02-24 | Disposition: A | Discharge status: Home / Self Care | Payer: Medicaid Other | Source: Ambulatory Visit | Attending: Internal Medicine | Admitting: Internal Medicine

## 2023-02-24 DIAGNOSIS — R079 Chest pain, unspecified: Secondary | ICD-10-CM | POA: Insufficient documentation

## 2023-02-24 DIAGNOSIS — I2510 Atherosclerotic heart disease of native coronary artery without angina pectoris: Secondary | ICD-10-CM | POA: Diagnosis not present

## 2023-02-24 MED ORDER — DILTIAZEM HCL 25 MG/5ML IV SOLN: 10.0000 mg | INTRAVENOUS | Status: DC | PRN

## 2023-02-24 MED ORDER — DILTIAZEM HCL 25 MG/5ML IV SOLN
INTRAVENOUS | Status: AC
  Filled 2023-02-24: qty 5

## 2023-02-24 MED ORDER — NITROGLYCERIN 0.4 MG SL SUBL
SUBLINGUAL_TABLET | SUBLINGUAL | Status: AC
  Filled 2023-02-24: qty 2

## 2023-02-24 MED ORDER — NITROGLYCERIN 0.4 MG SL SUBL
0.8000 mg | SUBLINGUAL_TABLET | Freq: Once | SUBLINGUAL | Status: AC
  Administered 2023-02-24: 0.8 mg via SUBLINGUAL

## 2023-02-24 MED ORDER — IOHEXOL 350 MG/ML SOLN
100.0000 mL | Freq: Once | INTRAVENOUS | Status: AC | PRN
  Administered 2023-02-24: 100 mL via INTRAVENOUS

## 2023-02-25 ENCOUNTER — Other Ambulatory Visit: Payer: Self-pay | Admitting: Nurse Practitioner

## 2023-02-25 ENCOUNTER — Ambulatory Visit (HOSPITAL_COMMUNITY)
Admission: RE | Admit: 2023-02-25 | Discharge: 2023-02-25 | Disposition: A | Discharge status: Home / Self Care | Payer: Medicaid Other | Source: Ambulatory Visit | Attending: Gastroenterology | Admitting: Gastroenterology

## 2023-02-25 ENCOUNTER — Ambulatory Visit: Payer: Self-pay | Admitting: Nurse Practitioner

## 2023-02-25 ENCOUNTER — Encounter (HOSPITAL_COMMUNITY): Payer: Self-pay | Admitting: Internal Medicine

## 2023-02-25 DIAGNOSIS — R935 Abnormal findings on diagnostic imaging of other abdominal regions, including retroperitoneum: Secondary | ICD-10-CM | POA: Insufficient documentation

## 2023-02-25 DIAGNOSIS — R790 Abnormal level of blood mineral: Secondary | ICD-10-CM | POA: Insufficient documentation

## 2023-02-25 DIAGNOSIS — Z0389 Encounter for observation for other suspected diseases and conditions ruled out: Secondary | ICD-10-CM | POA: Diagnosis not present

## 2023-02-26 ENCOUNTER — Other Ambulatory Visit (HOSPITAL_COMMUNITY): Payer: Self-pay | Admitting: *Deleted

## 2023-02-26 ENCOUNTER — Other Ambulatory Visit: Payer: Medicaid Other

## 2023-02-26 ENCOUNTER — Ambulatory Visit (HOSPITAL_COMMUNITY)
Admission: RE | Admit: 2023-02-26 | Discharge: 2023-02-26 | Disposition: A | Discharge status: Home / Self Care | Payer: Medicaid Other | Source: Ambulatory Visit | Attending: Cardiology | Admitting: Cardiology

## 2023-02-26 DIAGNOSIS — R931 Abnormal findings on diagnostic imaging of heart and coronary circulation: Secondary | ICD-10-CM

## 2023-02-26 DIAGNOSIS — I2510 Atherosclerotic heart disease of native coronary artery without angina pectoris: Secondary | ICD-10-CM

## 2023-02-26 NOTE — Patient Outreach (Signed)
Medicaid Managed Care Social Work Note  02/26/2023 Name:  Misty Mccullough MRN:  [id number] DOB:  August 13, 1971  Misty Mccullough is an 52 y.o. year old female who is a primary patient of Ivonne Andrew, NP.  The [facility] Managed Care Coordination team was consulted for assistance with:  Food Insecurity  Ms. Forni was given information about Medicaid Managed Care Coordination team services today. [name] Patient agreed to services and verbal consent obtained.  Engaged with patient  for by telephone forinitial visit in response to referral for case management and/or care coordination services.   Assessments/Interventions:  Review of past medical history, allergies, medications, health status, including review of consultants reports, laboratory and other test data, was performed as part of comprehensive evaluation and provision of chronic care management services.  SDOH: (Social Determinant of Health) assessments and interventions performed: SDOH Interventions    Flowsheet Row Office Visit from 02/20/2023 in [facility] Office Visit from 12/18/2022 in [facility]  SDOH Interventions    Food Insecurity Interventions --  [referral to RN CM] --  Depression Interventions/Treatment  -- PHQ2-9 Score <4 Follow-up Not Indicated  Financial Strain Interventions --  [medication access review] --     BSW completed a telephone outreach with patient, she currently lives in a trailer in [place] paying 400 a month is rent. She does receieve 20 in foodstamps each month but it is not enough. Patient states she is only working 3-4 days a week due to having so many appointments. BSW will mail patient resources for [name] No other resources are needed at this time.  Advanced Directives Status:  Not addressed in this encounter.  Care Plan                 No Known Allergies  Medications Reviewed Today     Reviewed by Alden Hipp, RPH-CPP (Pharmacist) on 02/20/23  at 0900  Med List Status: <None>   Medication Order Taking? Sig Documenting Provider Last Dose Status Informant  albuterol (PROVENTIL) (2.5 MG/3ML) 0.083% nebulizer solution [id number]  Take 3 mLs (2.5 mg total) by nebulization every 4 (four) hours as needed for wheezing or shortness of breath. Ivonne Andrew, NP  Active Self  albuterol (VENTOLIN HFA) 108 (90 Base) MCG/ACT inhaler [id number]  Inhale 2 puffs into the lungs every 6 (six) hours as needed for wheezing or shortness of breath. Ivonne Andrew, NP  Active Self  aspirin EC 81 MG tablet [id number] Yes Take 1 tablet (81 mg total) by mouth daily with breakfast. Swallow whole. Shon Hale, MD Taking Active Self  Aspirin-Caffeine (BC FAST PAIN RELIEF ARTHRITIS PO) [id number] No Take 1 packet by mouth daily as needed (pain.).  Patient not taking: Reported on 02/20/2023   [provider] Not Taking Active Self  atorvastatin (LIPITOR) 10 MG tablet [id number] Yes Take 1 tablet (10 mg total) by mouth daily. Ivonne Andrew, NP Taking Active Self  blood glucose meter kit and supplies KIT [id number]  Dispense based on patient and insurance preference. Use up to four times daily as directed. Orion Crook I, NP  Active Self  Continuous Glucose Sensor (FREESTYLE LIBRE 3 SENSOR) [place] [id number]  Place 1 sensor on the skin every 14 days. Use to check glucose continuously  Patient not taking: Reported on 02/12/2023   Ivonne Andrew, NP  Active Self  dicyclomine (BENTYL) 10 MG capsule [id number]  Take 1 capsule (10 mg total) by mouth 4 (four) times  daily -  before meals and at bedtime. Gelene Mink, NP  Active Self  empagliflozin (JARDIANCE) 10 MG TABS tablet [id number] Yes Take 1 tablet (10 mg total) by mouth daily before breakfast. Ivonne Andrew, NP Taking Active Self           Med Note Clearance Coots, Alanda Slim Feb 20, 2023  8:58 AM)    folic acid (FOLVITE) 800 MCG tablet [id number] Yes Take 1 tablet (800 mcg total) by mouth daily.  Ivonne Andrew, NP Taking Active Self  furosemide (LASIX) 40 MG tablet [id number] Yes Take 1.5 tablets (60 mg total) by mouth daily.  Patient taking differently: Take 60 mg by mouth daily at 12 noon.   Mallipeddi, Vishnu P, MD Taking Active Self  ibuprofen (ADVIL) 200 MG tablet [id number]  Take 800 mg by mouth every 8 (eight) hours as needed (pain.). [provider]  Active Self  insulin glargine (LANTUS) 100 UNIT/ML Solostar Pen [id number] Yes Inject 28 Units into the skin daily.  Patient taking differently: Inject 21 Units into the skin at bedtime.   Ivonne Andrew, NP Taking Active Self  lisinopril (ZESTRIL) 40 MG tablet [id number] Yes Take 1 tablet (40 mg total) by mouth daily. Ivonne Andrew, NP Taking Active Self  metFORMIN (GLUCOPHAGE-XR) 500 MG 24 hr tablet [id number] Yes Take 1 tablet (500 mg total) by mouth 2 (two) times daily with a meal.  Patient taking differently: Take 1,000 mg by mouth 2 (two) times daily with a meal.   Ivonne Andrew, NP Taking Active Self           Med Note Clearance Coots, Alanda Slim Feb 20, 2023  8:58 AM)    metoprolol tartrate (LOPRESSOR) 25 MG tablet [id number] Yes Take 1 tablet (25 mg total) by mouth 2 (two) times daily. Mallipeddi, Orion Modest, MD Taking Active Self  Nebulizers (COMPRESSOR/NEBULIZER) MISC [id number]  1 Units by Does not apply route as needed. Shon Hale, MD  Active Self  omeprazole (PRILOSEC) 20 MG capsule [id number] No Take 1 capsule (20 mg total) by mouth daily.  Patient not taking: Reported on 02/20/2023   Ivonne Andrew, NP Not Taking Active Self  PAIN RELIEVING LIDOCAINE EX [id number] Yes Apply 1 Application topically daily. [provider] Taking Active Self  SV IRON 325 (65 Fe) MG tablet [id number] Yes Take 1 tablet by mouth once daily Ivonne Andrew, NP Taking Active Self           Med Note Farris Has Feb 17, 2023 10:31 AM) On hold due to upcoming procedure.            Patient Active  Problem List   Diagnosis Date Noted   Low ferritin 02/25/2023   Dysphagia 02/11/2023   Claudication of lower extremity (HCC) 01/22/2023   Colon cancer screening 12/25/2022   Sepsis (HCC) 12/20/2022   CAP (community acquired pneumonia) 12/20/2022   COPD with acute exacerbation (HCC) 12/20/2022   AKI (acute kidney injury) (HCC) 12/20/2022   Acute respiratory failure with hypoxia (HCC) 12/19/2022   Chronic diastolic heart failure (HCC) 11/16/2022   Acute exacerbation of Diastolic CHF (congestive heart failure) (HCC) 11/14/2022   Cough 12/13/2021   Chronic bronchitis (HCC) 12/13/2021   Shortness of breath 08/25/2019   Anxiety 08/25/2019   Hyperglycemia 08/25/2019   Hemoglobin A1C between 7% and 9% indicating borderline diabetic control (HCC) 08/25/2019   Type 2 diabetes mellitus without complication, without long-term  current use of insulin (HCC) 11/24/2018   Bilateral lower extremity edema 11/24/2018   Pain and swelling of toe, left 11/04/2018   Insomnia 11/04/2018   Tobacco dependence 02/05/2018   H/O: CVA (cerebrovascular accident) 02/05/2018   Tobacco use 12/16/2017   Acute CVA (cerebrovascular accident) (HCC) 12/15/2017   Malnutrition of moderate degree 08/14/2016   Cardiac chest pain 08/13/2016   Back pain 08/13/2016   Essential hypertension 08/13/2016   HLD (hyperlipidemia) 08/13/2016   Diabetes mellitus with complication (HCC) 08/13/2016   Chest pain, exertional     Conditions to be addressed/monitored per PCP order:   food resources  There are no care plans that you recently modified to display for this patient.   Follow up:  Patient agrees to Care Plan and Follow-up.  Plan: The Managed Medicaid care management team will reach out to the patient again over the next 14 days.  Date/time of next scheduled Social Work care management/care coordination outreach:  03/18/23  Gus Puma, Kenard Gower, [facility]  Managed [facility] Social Worker [phone]

## 2023-02-26 NOTE — Patient Instructions (Signed)
Visit Information  Ms. Sleigh was given information about Medicaid Managed Care team care coordination services as a part of their [facility] Community Plan Medicaid benefit. [name] verbally consented to engagement with the [facility] Managed Care team.   If you are experiencing a medical emergency, please call 911 or report to your local emergency department or urgent care.   If you have a non-emergency medical problem during routine business hours, please contact your provider's office and ask to speak with a nurse.   For questions related to your [facility], please call: [phone] or visit the homepage here: [email or web address]  If you would like to schedule transportation through your [facility], please call the following number at least 2 days in advance of your appointment: [phone]   Rides for urgent appointments can also be made after hours by calling Member Services.  Call the Behavioral Health Crisis Line at ([phone], at any time, 24 hours a day, 7 days a week. If you are in danger or need immediate medical attention call 911.  If you would like help to quit smoking, call 1-800-QUIT-NOW ((530)345-4489) OR Espaol: 1-855-Djelo-Ya (1-027-253-6644) o para ms informacin haga clic aqu or Text READY to 034-742 to register via text  Ms. Jeffcoat - following are the goals we discussed in your visit today:   Goals Addressed   None      Social Worker will follow up on 03/18/23.   Gus Puma, Kenard Gower, MHA [facility]  Managed Medicaid Social Worker [phone]   Following is a copy of your plan of care:  There are no care plans that you recently modified to display for this patient.

## 2023-02-28 ENCOUNTER — Encounter: Payer: Self-pay | Admitting: Obstetrics and Gynecology

## 2023-02-28 ENCOUNTER — Other Ambulatory Visit: Payer: Medicaid Other | Admitting: Obstetrics and Gynecology

## 2023-02-28 NOTE — Patient Outreach (Signed)
Medicaid Managed Care   Nurse Care Manager Note  02/28/2023 Name:  Misty Mccullough MRN:  [id number] DOB:  1971-08-22  Misty Mccullough is an 52 y.o. year old female who is a primary patient of Ivonne Andrew, NP.  The [facility] Managed Care Coordination team was consulted for assistance with:    Chronic healthcare management needs,  HTN, h/o CVA, CHF, COPD, DM2, HLD, anemia, tobacco use, anxiety,   [name] was given information about Medicaid Managed Care Coordination team services today. Misty Mccullough Patient agreed to services and verbal consent obtained.  Engaged with patient by telephone for initial visit in response to provider referral for case management and/or care coordination services.   Assessments/Interventions:  Review of past medical history, allergies, medications, health status, including review of consultants reports, laboratory and other test data, was performed as part of comprehensive evaluation and provision of chronic care management services.  SDOH (Social Determinants of Health) assessments and interventions performed: SDOH Interventions    Flowsheet Row Patient Outreach Telephone from 02/28/2023 in [facility] Office Visit from 02/20/2023 in [facility] Office Visit from 12/18/2022 in [facility]  SDOH Interventions     Food Insecurity Interventions -- --  Misty Mccullough to RN CM] --  Alcohol Usage Interventions Intervention Not Indicated (Score <7) -- --  Depression Interventions/Treatment  -- -- [id number] Score <4 Follow-up Not Indicated  Financial Strain Interventions -- --  [medication access review] --  Physical Activity Interventions Intervention Not Indicated -- --     Care Plan  No Known Allergies  Medications Reviewed Today     Reviewed by Danie Chandler, RN (Registered Nurse) on 02/28/23 at 1332  Med List Status: <None>   Medication Order Taking? Sig Documenting Provider Last Dose Status Informant   albuterol (PROVENTIL) (2.5 MG/3ML) 0.083% nebulizer solution [id number] No Take 3 mLs (2.5 mg total) by nebulization every 4 (four) hours as needed for wheezing or shortness of breath. Ivonne Andrew, NP 02/18/2023 Active Self  albuterol (VENTOLIN HFA) 108 (90 Base) MCG/ACT inhaler [id number] No Inhale 2 puffs into the lungs every 6 (six) hours as needed for wheezing or shortness of breath. Ivonne Andrew, NP 02/18/2023 Active Self  aspirin EC 81 MG tablet [id number] No Take 1 tablet (81 mg total) by mouth daily with breakfast. Swallow whole. Shon Hale, MD Taking Active Self  Aspirin-Caffeine (BC FAST PAIN RELIEF ARTHRITIS PO) [id number] No Take 1 packet by mouth daily as needed (pain.).  Patient not taking: Reported on 02/20/2023   [provider] Not Taking Active Self  atorvastatin (LIPITOR) 80 MG tablet [id number]  Take 1 tablet (80 mg total) by mouth daily. Mallipeddi, Vishnu P, MD  Active   blood glucose meter kit and supplies KIT [id number] No Dispense based on patient and insurance preference. Use up to four times daily as directed. Orion Crook I, NP Unknown Active Self  carvedilol (COREG) 12.5 MG tablet [id number]  Take 1 tablet (12.5 mg total) by mouth 2 (two) times daily with a meal. Ivonne Andrew, NP  Active   Continuous Glucose Sensor (FREESTYLE LIBRE 3 SENSOR) MISC [id number] No Place 1 sensor on the skin every 14 days. Use to check glucose continuously Ivonne Andrew, NP Taking Active Self  dicyclomine (BENTYL) 10 MG capsule [id number] No Take 1 capsule (10 mg total) by mouth 4 (four) times daily -  before meals and at bedtime. Gelene Mink, NP 02/18/2023 Active Self  empagliflozin (JARDIANCE) 10 MG TABS tablet [id number] No Take 1 tablet (10 mg total) by mouth daily before breakfast. Ivonne Andrew, NP Taking Active Self           Med Note Clearance Coots, Alanda Slim Feb 20, 2023  8:58 AM)    fluticasone-salmeterol (ADVAIR) 100-50 MCG/ACT AEPB [id number]  Inhale 1  puff into the lungs 2 (two) times daily. Ivonne Andrew, NP  Active   folic acid (FOLVITE) 800 MCG tablet [id number] No Take 1 tablet (800 mcg total) by mouth daily. Ivonne Andrew, NP Taking Active Self  furosemide (LASIX) 40 MG tablet [id number] No Take 1.5 tablets (60 mg total) by mouth daily.  Patient taking differently: Take 60 mg by mouth daily at 12 noon.   Mallipeddi, Vishnu P, MD Taking Active Self  ibuprofen (ADVIL) 200 MG tablet [id number] No Take 800 mg by mouth every 8 (eight) hours as needed (pain.). [provider] 02/18/2023 Active Self  insulin glargine (LANTUS) 100 UNIT/ML Solostar Pen [id number] No Inject 28 Units into the skin daily.  Patient taking differently: Inject 21 Units into the skin at bedtime.   Ivonne Andrew, NP Taking Active Self  lisinopril (ZESTRIL) 40 MG tablet [id number] No Take 1 tablet (40 mg total) by mouth daily. Ivonne Andrew, NP Taking Active Self  metFORMIN (GLUCOPHAGE-XR) 500 MG 24 hr tablet [id number] No Take 1 tablet (500 mg total) by mouth 2 (two) times daily with a meal.  Patient taking differently: Take 1,000 mg by mouth 2 (two) times daily with a meal.   Ivonne Andrew, NP Taking Active Self           Med Note Clearance Coots, Alanda Slim Feb 20, 2023  8:58 AM)    Nebulizers (COMPRESSOR/NEBULIZER) MISC [id number] No 1 Units by Does not apply route as needed. Shon Hale, MD 02/18/2023 Active Self  omeprazole (PRILOSEC) 20 MG capsule [id number] No Take 1 capsule (20 mg total) by mouth daily.  Patient not taking: Reported on 02/20/2023   Ivonne Andrew, NP Not Taking Active Self  PAIN RELIEVING LIDOCAINE EX [id number] No Apply 1 Application topically daily. [provider] Taking Active Self  SV IRON 325 (65 Fe) MG tablet [id number] No Take 1 tablet by mouth once daily Ivonne Andrew, NP Taking Active Self           Med Note Farris Has Feb 17, 2023 10:31 AM) On hold due to upcoming procedure.  Tiotropium  Bromide Monohydrate (SPIRIVA RESPIMAT) 2.5 MCG/ACT AERS [id number]  Inhale 2 puffs into the lungs daily. Ivonne Andrew, NP  Active            Patient Active Problem List   Diagnosis Date Noted   Low ferritin 02/25/2023   Dysphagia 02/11/2023   Claudication of lower extremity (HCC) 01/22/2023   Colon cancer screening 12/25/2022   Sepsis (HCC) 12/20/2022   CAP (community acquired pneumonia) 12/20/2022   COPD with acute exacerbation (HCC) 12/20/2022   AKI (acute kidney injury) (HCC) 12/20/2022   Acute respiratory failure with hypoxia (HCC) 12/19/2022   Chronic diastolic heart failure (HCC) 11/16/2022   Acute exacerbation of Diastolic CHF (congestive heart failure) (HCC) 11/14/2022   Cough 12/13/2021   Chronic bronchitis (HCC) 12/13/2021   Shortness of breath 08/25/2019   Anxiety 08/25/2019   Hyperglycemia 08/25/2019   Hemoglobin A1C between 7% and 9% indicating borderline diabetic control (HCC) 08/25/2019   Type 2 diabetes  mellitus without complication, without long-term current use of insulin (HCC) 11/24/2018   Bilateral lower extremity edema 11/24/2018   Pain and swelling of toe, left 11/04/2018   Insomnia 11/04/2018   Tobacco dependence 02/05/2018   H/O: CVA (cerebrovascular accident) 02/05/2018   Tobacco use 12/16/2017   Acute CVA (cerebrovascular accident) (HCC) 12/15/2017   Malnutrition of moderate degree 08/14/2016   Cardiac chest pain 08/13/2016   Back pain 08/13/2016   Essential hypertension 08/13/2016   HLD (hyperlipidemia) 08/13/2016   Diabetes mellitus with complication (HCC) 08/13/2016   Chest pain, exertional    Conditions to be addressed/monitored per PCP order:  HTN, h/o CVA, CHF, COPD, DM2, HLD, anemia, tobacco use, anxiety,   Care Plan : RN Care Manager Plan of Care  Updates made by Danie Chandler, RN since 02/28/2023 12:00 AM     Problem: Health Promotion or Disease Self-Management (General Plan of Care)      Long-Range Goal: Chronic Disease  Management   Start Date: 02/28/2023  Expected End Date: 05/31/2023  Priority: High  Note:   Current Barriers:  Knowledge Deficits related to plan of care for management of HTN, h/o CVA, CHF, COPD, DM2, HLD, anemia, tobacco use, anxiety,  Chronic Disease Management support and education needs related to HTN, h/o CVA, CHF, COPD, DM2, HLD, anemia, tobacco use, anxiety,  Financial Constraints   RNCM Clinical Goal(s):  Patient will verbalize understanding of plan for management of HTN, h/o CVA, CHF, COPD, DM2, HLD, anemia, tobacco use, anxiety,  as evidenced by patient report verbalize basic understanding of  HTN, h/o CVA, CHF, COPD, DM2, HLD, anemia, tobacco use, anxiety,  disease process and self health management plan as evidenced by patient report take all medications exactly as prescribed and will call provider for medication related questions as evidenced by patient report demonstrate understanding of rationale for each prescribed medication as evidenced by patient report attend all scheduled medical appointments as evidenced by patient report and EMR review. demonstrate ongoing adherence to prescribed treatment plan for HTN, h/o CVA, CHF, COPD, DM2, HLD, anemia, tobacco use, anxiety,  as evidenced by patient report and EMR review. continue to work with RN Care Manager to address care management and care coordination needs related to  HTN, h/o CVA, CHF, COPD, DM2, HLD, anemia, tobacco use, anxiety,  as evidenced by adherence to CM Team Scheduled appointments work with Child psychotherapist to address needed food resources  related to the management of  HTN, h/o CVA, CHF, COPD, DM2, HLD, anemia, tobacco use, anxiety, as evidenced by review of EMR and patient or Child psychotherapist report through collaboration with Medical illustrator, provider, and care team.   Interventions: Inter-disciplinary care team collaboration (see longitudinal plan of care) Evaluation of current treatment plan related to  self management  and patient's adherence to plan as established by provider  Heart Failure Interventions:  (Status:  New goal.) Long Term Goal Discussed the importance of keeping all appointments with provider Assessed social determinant of health barriers   COPD Interventions:  (Status:  New goal.) Long Term Goal Advised patient to track and manage COPD triggers Provided instruction about proper use of medications used for management of COPD including inhalers Advised patient to engage in light exercise as tolerated 3-5 days a week to aid in the the management of COPD Discussed the importance of adequate rest and management of fatigue with COPD Assessed social determinant of health barriers 02/28/23:  patient to f/u with [facility] regarding sleep study and PFTs  Diabetes  Interventions:  (Status:  New goal.) Long Term Goal Assessed patient's understanding of A1c goal: <7% Reviewed medications with patient and discussed importance of medication adherence Counseled on importance of regular laboratory monitoring as prescribed Discussed plans with patient for ongoing care management follow up and provided patient with direct contact information for care management team Reviewed scheduled/upcoming provider appointments  Advised patient, providing education and rationale, to check cbg as directed and record, calling provider  for findings outside established parameters Review of patient status, including review of consultants reports, relevant laboratory and other test results, and medications completed Assessed social determinant of health barriers Lab Results  Component Value Date   HGBA1C 7.9 (A) 12/18/2022  Hyperlipidemia Interventions:  (Status:  New goal.) Long Term Goal Medication review performed; medication list updated in electronic medical record.  Provider established cholesterol goals reviewed Counseled on importance of regular laboratory monitoring as prescribed Reviewed importance of limiting foods  high in cholesterol Assessed social determinant of health barriers   Hypertension Interventions:  (Status:  New goal.) Long Term Goal Last practice recorded BP readings:  BP Readings from Last 3 Encounters:  02/24/23 137/71  02/20/23 (!) 180/72  02/19/23 (!) 133/52  Most recent eGFR/CrCl:  Lab Results  Component Value Date   EGFR 103 12/25/2022    No components found for: "CRCL"  Evaluation of current treatment plan related to hypertension self management and patient's adherence to plan as established by provider Reviewed medications with patient and discussed importance of compliance Discussed plans with patient for ongoing care management follow up and provided patient with direct contact information for care management team Advised patient, providing education and rationale, to monitor blood pressure daily and record, calling PCP for findings outside established parameters Reviewed scheduled/upcoming provider appointments including:  Assessed social determinant of health barriers  Smoking Cessation Interventions:  (Status:  New goal.) Long Term Goal Reviewed smoking history:  ; currently smoking 1 ppd-decreased from 2 packs a day  Evaluation of current treatment plan reviewed Advised patient to discuss smoking cessation options with provider Reviewed scheduled/upcoming provider appointments  Provided contact information for [facility] ([email or web address]) Discussed plans with patient for ongoing care management follow up and provided patient with direct contact information for care management team Assessed social determinant of health barriers  Patient Goals/Self-Care Activities: Take all medications as prescribed Attend all scheduled provider appointments Call pharmacy for medication refills 3-7 days in advance of running out of medications Perform all self care activities independently  Perform IADL's (shopping, preparing meals, housekeeping, managing finances)  independently Call provider office for new concerns or questions  Work with the social worker to address care coordination needs and will continue to work with the clinical team to address health care and disease management related needs  Follow Up Plan:  The patient has been provided with contact information for the care management team and has been advised to call with any health related questions or concerns.  The care management team will reach out to the patient again over the next 30 business  days.   Long-Range Goal: Establish Plan of Care for Chronic Disease Management Needs   Priority: High  Note:   Timeframe:  Long-Range Goal Priority:  High Start Date:  02/28/23                           Expected End Date: ongoing  Follow Up Date 04/02/26    - schedule appointment for vaccines needed due to my age or health - schedule recommended health tests (blood work, mammogram, colonoscopy, pap test) - schedule and keep appointment for annual check-up    Why is this important?   Screening tests can find diseases early when they are easier to treat.  Your doctor or nurse will talk with you about which tests are important for you.  Getting shots for common diseases like the flu and shingles will help prevent them.     Follow Up:  Patient agrees to Care Plan and Follow-up.  Plan: The Managed Medicaid care management team will reach out to the patient again over the next 30 business  days. and The  Patient has been provided with contact information for the Managed Medicaid care management team and has been advised to call with any health related questions or concerns.  Date/time of next scheduled RN care management/care coordination outreach:  04/03/23 at 230

## 2023-02-28 NOTE — Patient Instructions (Signed)
Hi Ms. Dutchover, thank you for speaking with me today-have a very nice weekend!!  Ms. Lichterman was given information about Medicaid Managed Care team care coordination services as a part of their [facility] Plan Medicaid benefit. [name] verbally consented to engagement with the [facility] Managed Care team.   If you are experiencing a medical emergency, please call 911 or report to your local emergency department or urgent care.   If you have a non-emergency medical problem during routine business hours, please contact your provider's office and ask to speak with a nurse.   For questions related to your [facility], please call: [phone] or visit the homepage here: [email or web address]  If you would like to schedule transportation through your [facility], please call the following number at least 2 days in advance of your appointment: [phone]   Rides for urgent appointments can also be made after hours by calling Member Services.  Call the Behavioral Health Crisis Line at [phone], at any time, 24 hours a day, 7 days a week. If you are in danger or need immediate medical attention call 911.  If you would like help to quit smoking, call 1-800-QUIT-NOW (9712257442) OR Espaol: 1-855-Djelo-Ya (1-324-401-0272) o para ms informacin haga clic aqu or Text READY to 536-644 to register via text  Ms. Borden - following are the goals we discussed in your visit today:   Goals Addressed    Timeframe:  Long-Range Goal Priority:  High Start Date:  02/28/23                           Expected End Date: ongoing                      Follow Up Date 04/02/26    - schedule appointment for vaccines needed due to my age or health - schedule recommended health tests (blood work, mammogram, colonoscopy, pap test) - schedule and keep appointment for annual check-up    Why is  this important?   Screening tests can find diseases early when they are easier to treat.  Your doctor or nurse will talk with you about which tests are important for you.  Getting shots for common diseases like the flu and shingles will help prevent them.  Patient verbalizes understanding of instructions and care plan provided today and agrees to view in MyChart. Active MyChart status and patient understanding of how to access instructions and care plan via MyChart confirmed with patient.     The Managed Medicaid care management team will reach out to the patient again over the next 30 business  days.  The  Patient   has been provided with contact information for the Managed Medicaid care management team and has been advised to call with any health related questions or concerns.   Kathi Der RN, BSN [place]  [facility] Care Management Coordinator - Managed Medicaid High Risk [phone]   Following is a copy of your plan of care:  Care Plan : RN Care Manager Plan of Care  Updates made by Danie Chandler, RN since 02/28/2023 12:00 AM     Problem: Health Promotion or Disease Self-Management (General Plan of Care)      Long-Range Goal: Chronic Disease Management   Start Date: 02/28/2023  Expected End Date: 05/31/2023  Priority: High  Note:   Current Barriers:  Knowledge Deficits related to plan of  care for management of HTN, h/o CVA, CHF, COPD, DM2, HLD, anemia, tobacco use, anxiety,  Chronic Disease Management support and education needs related to HTN, h/o CVA, CHF, COPD, DM2, HLD, anemia, tobacco use, anxiety,  Financial Constraints   RNCM Clinical Goal(s):  Patient will verbalize understanding of plan for management of HTN, h/o CVA, CHF, COPD, DM2, HLD, anemia, tobacco use, anxiety,  as evidenced by patient report verbalize basic understanding of  HTN, h/o CVA, CHF, COPD, DM2, HLD, anemia, tobacco use, anxiety,  disease process and self health management plan as  evidenced by patient report take all medications exactly as prescribed and will call provider for medication related questions as evidenced by patient report demonstrate understanding of rationale for each prescribed medication as evidenced by patient report attend all scheduled medical appointments as evidenced by patient report and EMR review. demonstrate ongoing adherence to prescribed treatment plan for HTN, h/o CVA, CHF, COPD, DM2, HLD, anemia, tobacco use, anxiety,  as evidenced by patient report and EMR review. continue to work with RN Care Manager to address care management and care coordination needs related to  HTN, h/o CVA, CHF, COPD, DM2, HLD, anemia, tobacco use, anxiety,  as evidenced by adherence to CM Team Scheduled appointments work with Child psychotherapist to address needed food resources  related to the management of  HTN, h/o CVA, CHF, COPD, DM2, HLD, anemia, tobacco use, anxiety, as evidenced by review of EMR and patient or Child psychotherapist report through collaboration with Medical illustrator, provider, and care team.   Interventions: Inter-disciplinary care team collaboration (see longitudinal plan of care) Evaluation of current treatment plan related to  self management and patient's adherence to plan as established by provider  Heart Failure Interventions:  (Status:  New goal.) Long Term Goal Discussed the importance of keeping all appointments with provider Assessed social determinant of health barriers   COPD Interventions:  (Status:  New goal.) Long Term Goal Advised patient to track and manage COPD triggers Provided instruction about proper use of medications used for management of COPD including inhalers Advised patient to engage in light exercise as tolerated 3-5 days a week to aid in the the management of COPD Discussed the importance of adequate rest and management of fatigue with COPD Assessed social determinant of health barriers 02/28/23:  patient to f/u with PULM  regarding sleep study and PFTs  Diabetes Interventions:  (Status:  New goal.) Long Term Goal Assessed patient's understanding of A1c goal: <7% Reviewed medications with patient and discussed importance of medication adherence Counseled on importance of regular laboratory monitoring as prescribed Discussed plans with patient for ongoing care management follow up and provided patient with direct contact information for care management team Reviewed scheduled/upcoming provider appointments  Advised patient, providing education and rationale, to check cbg as directed and record, calling provider  for findings outside established parameters Review of patient status, including review of consultants reports, relevant laboratory and other test results, and medications completed Assessed social determinant of health barriers Lab Results  Component Value Date   HGBA1C 7.9 (A) 12/18/2022  Hyperlipidemia Interventions:  (Status:  New goal.) Long Term Goal Medication review performed; medication list updated in electronic medical record.  Provider established cholesterol goals reviewed Counseled on importance of regular laboratory monitoring as prescribed Reviewed importance of limiting foods high in cholesterol Assessed social determinant of health barriers   Hypertension Interventions:  (Status:  New goal.) Long Term Goal Last practice recorded BP readings:  BP Readings from Last 3 Encounters:  02/24/23 137/71  02/20/23 (!) 180/72  02/19/23 (!) 133/52  Most recent eGFR/CrCl:  Lab Results  Component Value Date   EGFR 103 12/25/2022    No components found for: "CRCL"  Evaluation of current treatment plan related to hypertension self management and patient's adherence to plan as established by provider Reviewed medications with patient and discussed importance of compliance Discussed plans with patient for ongoing care management follow up and provided patient with direct contact information for  care management team Advised patient, providing education and rationale, to monitor blood pressure daily and record, calling PCP for findings outside established parameters Reviewed scheduled/upcoming provider appointments including:  Assessed social determinant of health barriers  Smoking Cessation Interventions:  (Status:  New goal.) Long Term Goal Reviewed smoking history:  ; currently smoking 1 ppd-decreased from 2 packs a day  Evaluation of current treatment plan reviewed Advised patient to discuss smoking cessation options with provider Reviewed scheduled/upcoming provider appointments  Provided contact information for  Quit Line (1-800-QUIT-NOW) Discussed plans with patient for ongoing care management follow up and provided patient with direct contact information for care management team Assessed social determinant of health barriers  Patient Goals/Self-Care Activities: Take all medications as prescribed Attend all scheduled provider appointments Call pharmacy for medication refills 3-7 days in advance of running out of medications Perform all self care activities independently  Perform IADL's (shopping, preparing meals, housekeeping, managing finances) independently Call provider office for new concerns or questions  Work with the social worker to address care coordination needs and will continue to work with the clinical team to address health care and disease management related needs  Follow Up Plan:  The patient has been provided with contact information for the care management team and has been advised to call with any health related questions or concerns.  The care management team will reach out to the patient again over the next 30 business  days.

## 2023-03-04 ENCOUNTER — Ambulatory Visit: Payer: Medicaid Other | Attending: Internal Medicine | Admitting: Internal Medicine

## 2023-03-04 ENCOUNTER — Encounter: Payer: Self-pay | Admitting: Internal Medicine

## 2023-03-04 ENCOUNTER — Other Ambulatory Visit (HOSPITAL_COMMUNITY)
Admission: RE | Admit: 2023-03-04 | Discharge: 2023-03-04 | Disposition: A | Discharge status: Home / Self Care | Payer: Medicaid Other | Source: Ambulatory Visit | Attending: Internal Medicine | Admitting: Internal Medicine

## 2023-03-04 VITALS — BP 108/66 | HR 71 | Ht 67.0 in | Wt 139.0 lb

## 2023-03-04 DIAGNOSIS — R079 Chest pain, unspecified: Secondary | ICD-10-CM

## 2023-03-04 DIAGNOSIS — R931 Abnormal findings on diagnostic imaging of heart and coronary circulation: Secondary | ICD-10-CM

## 2023-03-04 DIAGNOSIS — I25118 Atherosclerotic heart disease of native coronary artery with other forms of angina pectoris: Secondary | ICD-10-CM | POA: Diagnosis not present

## 2023-03-04 DIAGNOSIS — I202 Refractory angina pectoris: Secondary | ICD-10-CM | POA: Diagnosis present

## 2023-03-04 DIAGNOSIS — Z01810 Encounter for preprocedural cardiovascular examination: Secondary | ICD-10-CM

## 2023-03-04 DIAGNOSIS — Z79899 Other long term (current) drug therapy: Secondary | ICD-10-CM

## 2023-03-04 DIAGNOSIS — E782 Mixed hyperlipidemia: Secondary | ICD-10-CM

## 2023-03-04 DIAGNOSIS — I340 Nonrheumatic mitral (valve) insufficiency: Secondary | ICD-10-CM | POA: Diagnosis not present

## 2023-03-04 DIAGNOSIS — I2510 Atherosclerotic heart disease of native coronary artery without angina pectoris: Secondary | ICD-10-CM | POA: Insufficient documentation

## 2023-03-04 LAB — CBC
HCT: 35.7 % — ABNORMAL LOW (ref 36.0–46.0)
Hemoglobin: 11.2 g/dL — ABNORMAL LOW (ref 12.0–15.0)
MCH: 26.2 pg (ref 26.0–34.0)
MCHC: 31.4 g/dL (ref 30.0–36.0)
MCV: 83.4 fL (ref 80.0–100.0)
Platelets: 201 10*3/uL (ref 150–400)
RBC: 4.28 MIL/uL (ref 3.87–5.11)
RDW: 17.1 % — ABNORMAL HIGH (ref 11.5–15.5)
WBC: 5.2 10*3/uL (ref 4.0–10.5)
nRBC: 0 % (ref 0.0–0.2)

## 2023-03-04 LAB — BASIC METABOLIC PANEL
Anion gap: 15 (ref 5–15)
BUN: 28 mg/dL — ABNORMAL HIGH (ref 6–20)
CO2: 24 mmol/L (ref 22–32)
Calcium: 9 mg/dL (ref 8.9–10.3)
Chloride: 99 mmol/L (ref 98–111)
Creatinine, Ser: 1.01 mg/dL — ABNORMAL HIGH (ref 0.44–1.00)
GFR, Estimated: >60 mL/min (ref >60)
Glucose, Bld: 157 mg/dL — ABNORMAL HIGH (ref 70–99)
Potassium: 4 mmol/L (ref 3.5–5.1)
Sodium: 138 mmol/L (ref 135–145)

## 2023-03-04 LAB — LIPID PANEL
Cholesterol: 121 mg/dL (ref 0–200)
HDL: 37 mg/dL — ABNORMAL LOW (ref >40)
LDL Cholesterol: 67 mg/dL (ref 0–99)
Total CHOL/HDL Ratio: 3.3 RATIO
Triglycerides: 86 mg/dL (ref <150)
VLDL: 17 mg/dL (ref 0–40)

## 2023-03-04 MED ORDER — RANOLAZINE ER 500 MG PO TB12: 500.0000 mg | ORAL_TABLET | Freq: Two times a day (BID) | ORAL | 3 refills | Status: AC

## 2023-03-04 MED ORDER — RANOLAZINE ER 500 MG PO TB12: 500.0000 mg | ORAL_TABLET | Freq: Two times a day (BID) | ORAL | 3 refills | Status: DC

## 2023-03-04 NOTE — Patient Instructions (Addendum)
Medication Instructions:  Your physician has recommended you make the following change in your medication:  Start ranexa 500 mg twice daily Continue other medications the same  Labwork: Lipid Panel in 3 months (end of August or first of September 2024) [facility]-non-fasting BMET & CBC today at [facility]  Testing/Procedures: Your physician has requested that you have a cardiac catheterization. Cardiac catheterization is used to diagnose and/or treat various heart conditions. Doctors may recommend this procedure for a number of different reasons. The most common reason is to evaluate chest pain. Chest pain can be a symptom of coronary artery disease (CAD), and cardiac catheterization can show whether plaque is narrowing or blocking your heart's arteries. This procedure is also used to evaluate the valves, as well as measure the blood flow and oxygen levels in different parts of your heart. For further information please visit [email or web address] Please follow instruction sheet, as given.  Follow-Up: Your physician recommends that you schedule a follow-up appointment in: 1 month  Any Other Special Instructions Will Be Listed Below (If Applicable).  If you need a refill on your cardiac medications before your next appointment, please call your pharmacy.   [facility] A DEPT OF [facility] [address] [place] [id number] [facility] [place] [address] Dept: [phone] Loc: [phone]  Misty Mccullough  03/04/2023  You are scheduled for a Cardiac Catheterization on Friday, May 31 with Dr. Lance Muss.  1. Please arrive at the [facility] (Main Entrance A) at [facility]: [address] [place], [place] [address] at 5:30 AM (This time is 2 hour(s) before your procedure to ensure your preparation). Free valet parking service is available. You will check in at ADMITTING. The support person will be asked to wait in  the waiting room.  It is OK to have someone drop you off and come back when you are ready to be discharged.    Special note: Every effort is made to have your procedure done on time. Please understand that emergencies sometimes delay scheduled procedures.  2. Diet: Do not eat solid foods after midnight.  The patient may have clear liquids until 5am upon the day of the procedure.  3. Labs: You will need to have blood drawn on Tuesday, May 28 at [facility]. You do not need to be fasting.  4. Medication instructions in preparation for your procedure: Hold jardiance, furosemide, metformin on the morning of your cath. You may take the rest of your medications with a sip of water.   Contrast Allergy: No  Take only 14 units of insulin the night before your procedure. Do not take any insulin on the day of the procedure.  Do not take Diabetes Med Glucophage (Metformin) on the day of the procedure and HOLD 48 HOURS AFTER THE PROCEDURE.  On the morning of your procedure, take your Aspirin 81 mg and any morning medicines NOT listed above.  You may use sips of water.  5. Plan to go home the same day, you will only stay overnight if medically necessary. 6. Bring a current list of your medications and current insurance cards. 7. You MUST have a responsible person to drive you home. 8. Someone MUST be with you the first 24 hours after you arrive home or your discharge will be delayed. 9. Please wear clothes that are easy to get on and off and wear slip-on shoes.  Thank you for allowing [place] to  care for you!   -- [facility]

## 2023-03-04 NOTE — Progress Notes (Signed)
Cardiology Office Note  Date: 03/04/2023   ID: Misty Mccullough, DOB Jan 23, 1971, MRN [id number]  PCP:  Ivonne Andrew, NP  Cardiologist:  Marjo Bicker, MD Electrophysiologist:  None   Reason for Office Visit: Follow-up of chest pains   History of Present Illness: Misty Mccullough is a 52 y.o. female known to have HTN, DM 2, nicotine abuse, chronic diastolic heart failure, COPD is here for follow-up of chest pains.  Patient underwent NM stress test in 2017 due to history of chest pain, there was no evidence of ischemia. Echocardiogram from 11/15/2022 showed LVEF 55 to 60%, mild LVH, DD G1 DD, RV systolic function is normal, mild MR. Patient was having substernal chest tightness/chest pressure x 1 year with exertion, last for a few minutes, initially less frequent but in the last few months, the chest tightness has been occurring every day multiple times throughout the day (especially with exertion). This chest tightness resolves only with rest. She was recently admitted to the hospital in 11/26/2022 and 12/25/2022 with COPD exacerbation and heart failure exacerbation. She currently smokes 1 pack/day, previously used to smoke 2 packs/day. She also has bilateral lower leg claudication, cramps in her calfs when she walks.  She is here for follow-up visit. I started her on Imdur and metoprolol tartrate in the last clinic visit, she had severe headaches with Imdur after which the medication was stopped. Metoprolol was switched to carvedilol due to poorly controlled HTN, by PCP. CT cardiac from 02/2023 showed coronary calcium score of 319.  FFR analysis showed suspect hemodynamically significant stenosis in the distal RCA/proximal PDA, suspect hemodynamic significant stenosis in the mid LCx but the vessel could not be assessed by FFR at this point and is small in caliber, FFR in the distal LAD suggesting moderately significant LAD stenosis but it was not clear where in the LAD the stenosis was located. She  continues to have chest tightness with exertion, multiple times throughout the day and almost 3-4 times per week.  R ABI 0.89 and L ABI 0.76, suggesting mild PAD in the RLE and moderate PAD in the LLE.  Toe brachial index is abnormal in both extremities.  Denies other symptoms of dizziness, palpitations and leg swelling.  Past Medical History:  Diagnosis Date   Bilateral lower extremity edema    CHF (congestive heart failure) (HCC)    COPD (chronic obstructive pulmonary disease) (HCC)    Cough    Diabetes mellitus without complication (HCC)    Gallstones    GERD (gastroesophageal reflux disease)    Heavy cigarette smoker    Hemoglobin A1C between 7% and 9% indicating borderline diabetic control (HCC) 08/2019   Hypercholesteremia    Hyperglycemia    Hypertension    Shortness of breath    Stroke (cerebrum) (HCC)    Vitamin D deficiency 12/2020    Past Surgical History:  Procedure Laterality Date   BIOPSY  02/19/2023   Procedure: BIOPSY;  Surgeon: Corbin Ade, MD;  Location: [facility];  Service: Endoscopy;;   COLONOSCOPY WITH PROPOFOL N/A 02/19/2023   Procedure: COLONOSCOPY WITH PROPOFOL;  Surgeon: Corbin Ade, MD;  Location: [facility];  Service: Endoscopy;  Laterality: N/A;  8:15 am, asa 3   ESOPHAGOGASTRODUODENOSCOPY (EGD) WITH PROPOFOL N/A 02/19/2023   Procedure: ESOPHAGOGASTRODUODENOSCOPY (EGD) WITH PROPOFOL;  Surgeon: Corbin Ade, MD;  Location: [facility];  Service: Endoscopy;  Laterality: N/A;   MALONEY DILATION N/A 02/19/2023   Procedure: MALONEY DILATION;  Surgeon:  Rourk, Gerrit Friends, MD;  Location: [facility];  Service: Endoscopy;  Laterality: N/A;   TUBAL LIGATION      Current Outpatient Medications  Medication Sig Dispense Refill   albuterol (PROVENTIL) (2.5 MG/3ML) 0.083% nebulizer solution Take 3 mLs (2.5 mg total) by nebulization every 4 (four) hours as needed for wheezing or shortness of breath. 120 mL 2   albuterol (VENTOLIN HFA) 108 (90 Base)  MCG/ACT inhaler Inhale 2 puffs into the lungs every 6 (six) hours as needed for wheezing or shortness of breath. 1 each 11   aspirin EC 81 MG tablet Take 1 tablet (81 mg total) by mouth daily with breakfast. Swallow whole. 30 tablet 12   Aspirin-Caffeine (BC FAST PAIN RELIEF ARTHRITIS PO) Take 1 packet by mouth daily as needed (pain.). (Patient not taking: Reported on 02/20/2023)     atorvastatin (LIPITOR) 80 MG tablet Take 1 tablet (80 mg total) by mouth daily. 90 tablet 3   blood glucose meter kit and supplies KIT Dispense based on patient and insurance preference. Use up to four times daily as directed. 1 each 1   carvedilol (COREG) 12.5 MG tablet Take 1 tablet (12.5 mg total) by mouth 2 (two) times daily with a meal. 60 tablet 3   Continuous Glucose Sensor (FREESTYLE LIBRE 3 SENSOR) MISC Place 1 sensor on the skin every 14 days. Use to check glucose continuously 6 each 1   dicyclomine (BENTYL) 10 MG capsule Take 1 capsule (10 mg total) by mouth 4 (four) times daily -  before meals and at bedtime. 120 capsule 3   empagliflozin (JARDIANCE) 10 MG TABS tablet Take 1 tablet (10 mg total) by mouth daily before breakfast. 90 tablet 1   fluticasone-salmeterol (ADVAIR) 100-50 MCG/ACT AEPB Inhale 1 puff into the lungs 2 (two) times daily. 180 each 1   folic acid (FOLVITE) 800 MCG tablet Take 1 tablet (800 mcg total) by mouth daily. 30 tablet 2   furosemide (LASIX) 40 MG tablet Take 1.5 tablets (60 mg total) by mouth daily. (Patient taking differently: Take 60 mg by mouth daily at 12 noon.) 135 tablet 0   ibuprofen (ADVIL) 200 MG tablet Take 800 mg by mouth every 8 (eight) hours as needed (pain.).     insulin glargine (LANTUS) 100 UNIT/ML Solostar Pen Inject 28 Units into the skin daily. (Patient taking differently: Inject 21 Units into the skin at bedtime.) 15 mL 1   lisinopril (ZESTRIL) 40 MG tablet Take 1 tablet (40 mg total) by mouth daily. 90 tablet 1   metFORMIN (GLUCOPHAGE-XR) 500 MG 24 hr tablet Take  1 tablet (500 mg total) by mouth 2 (two) times daily with a meal. (Patient taking differently: Take 1,000 mg by mouth 2 (two) times daily with a meal.) 180 tablet 1   Nebulizers (COMPRESSOR/NEBULIZER) MISC 1 Units by Does not apply route as needed. 1 each 0   omeprazole (PRILOSEC) 20 MG capsule Take 1 capsule (20 mg total) by mouth daily. (Patient not taking: Reported on 02/20/2023) 90 capsule 1   PAIN RELIEVING LIDOCAINE EX Apply 1 Application topically daily.     SV IRON 325 (65 Fe) MG tablet Take 1 tablet by mouth once daily 90 tablet 0   Tiotropium Bromide Monohydrate (SPIRIVA RESPIMAT) 2.5 MCG/ACT AERS Inhale 2 puffs into the lungs daily. 12 g 1   No current facility-administered medications for this visit.   Allergies:  Patient has no known allergies.   Social History: The patient  reports that she  has been smoking cigarettes. She has a 32.00 pack-year smoking history. She has never used smokeless tobacco. She reports that she does not currently use alcohol. She reports that she does not use drugs.   Family History: The patient's family history includes Bone cancer in her paternal uncle; Cirrhosis in her brother; Diabetes in her father and mother; Heart attack in her paternal grandfather, paternal grandmother, and paternal uncle; Heart attack (age of onset: 34) in her father; Ovarian cancer in her maternal grandmother; Stroke in her father and sister; Uterine cancer in her mother.   ROS:  Please see the history of present illness. Otherwise, complete review of systems is positive for none  All other systems are reviewed and negative.   Physical Exam: VS:  LMP  (LMP Unknown) , BMI There is no height or weight on file to calculate BMI.  Wt Readings from Last 3 Encounters:  02/18/23 157 lb 9.6 oz (71.5 kg)  02/11/23 157 lb 9.6 oz (71.5 kg)  01/27/23 153 lb (69.4 kg)    General: Patient appears comfortable at rest. HEENT: Conjunctiva and lids normal, oropharynx clear with moist  mucosa. Neck: Supple, no elevated JVP or carotid bruits, no thyromegaly. Lungs: Clear to auscultation, nonlabored breathing at rest. Cardiac: Regular rate and rhythm, no S3 or significant systolic murmur, no pericardial rub. Abdomen: Soft, nontender, no hepatomegaly, bowel sounds present, no guarding or rebound. Extremities: No pitting edema Skin: Warm and dry. Musculoskeletal: No kyphosis. Neuropsychiatric: Alert and oriented x3, affect grossly appropriate.  Recent Labwork: 12/21/2022: Magnesium 2.1 01/18/2023: B Natriuretic Peptide 628.3 01/20/2023: ALT 11; AST 15; BUN 12; Creatinine 0.89; Hemoglobin 9.9; Platelet Count 201; Potassium 3.4; Sodium 139 02/11/2023: TSH 1.546     Component Value Date/Time   CHOL 234 (H) 12/18/2022 1424   TRIG 62 12/18/2022 1424   HDL 57 12/18/2022 1424   CHOLHDL 4.1 12/18/2022 1424   CHOLHDL 5.8 12/16/2017 0348   VLDL 17 12/16/2017 0348   LDLCALC 167 (H) 12/18/2022 1424    Other Studies Reviewed Today: CT cardiac in 02/2023 1. Left Main: No significant stenosis. 2. LAD: FFR drops to 0.7 in the distal LAD (but 0.82 in the mid LAD). It is not clear that the calcified proximal LAD stenosis is severe, but there is a significant FFR drop by the distal LAD. 3. LCX: FFR could not be performed on the mid to distal LCx. 4. RCA: FFR 0.7 proximal PDA, suggesting hemodynamic signficance.  Echocardiogram on 11/15/2022 LVEF 55 to 60% G1 DD RV systolic function is normal Mild MR  NM stress test in 2017 Horizontal ST segment depression ST segment depression of 0.5 mm was noted during stress in the II, III, aVF, V5 and V6 leads. No T wave inversion was noted during stress. There is a small defect of mild severity present in the apical anterior and apical septal location. The defect is non-reversible and consistent with breast attenuation artifact. This is a low risk study. The left ventricular ejection fraction is normal (55-65%). Nuclear stress EF:  60%.  Assessment and Plan: Patient is a 52 year old F known to have HTN, DM 2, nicotine abuse, COPD, chronic diastolic heart failure was referred to cardiology clinic for evaluation of CHF.  # Positive CCTA with CCS II-III angina -NM stress test in 2017 showed no evidence of ischemia but CCTA in 02/2023 showed flow-limiting lesion in the proximal LAD and proximal PDA/distal RCA. Patient had substernal exertional chest tightness/chest pressure with exertion that started 1 year ago initially less  frequent but in the last few months, occurring 3-4 times per week and multiple times throughout the day. Last for a few minutes and resolves with rest. Did not tolerate Imdur due to severe headaches. Metoprolol was switched to carvedilol due to poorly controlled HTN.  EKG today showed normal QTc, will start Ranexa 500 mg twice daily.  Patient will benefit from invasive ischemia evaluation with LHC due to positive CCTA and CCS class II-III angina. Risks and benefits of cardiac catheterization have been discussed with the patient.  These include bleeding, infection, kidney damage, stroke, heart attack, death.  The patient understands these risks and is willing to proceed. Patient is scheduled for LHC with Dr. Eldridge Dace on 03/07/2023. -Continue aspirin 81 mg once daily and atorvastatin 80 mg nightly (increased the dose from 10 mg to 80 mg a few weeks ago).  # Bilateral lower extremity PAD (R ABI 0.89 and L ABI 0.76 with abnormal toe brachial index bilaterally): Continue aspirin 81 mg once daily, atorvastatin 80 mg nightly, lisinopril 40 mg once daily, smoking cessation counseling. Patient will benefit from supervised exercise therapy for PAD after LHC.  # History of CVA: Continue aspirin 81 mg once daily and atorvastatin 80 mg nightly.,  Goal LDL less than 55.  # HLD: Continue atorvastatin 80 mg nightly (increased the dose from 10 mg to 80 mg a few weeks ago), goal LDL less than 55.  Repeat lipid panel in 3  months.  # Chronic diastolic heart failure, compensated: Continue p.o. Lasix 60 mg once daily  # HTN, controlled: Continue lisinopril 40 mg once daily, carvedilol 12.5 mg twice daily, Lasix 60 mg once daily.  # Nicotine abuse: Patient cut down smoking to 1 pack/day (previously was smoking 2 packs/day).  Smoking cessation counseling provided, cutting down cigarettes. Smoking cessation instruction/counseling given:  counseled patient on the dangers of tobacco use, advised patient to stop smoking, and reviewed strategies to maximize success   # Mild MR by 2024 Echo: Repeat echocardiogram in 3 years, in 2027.  I have spent a total of 30 minutes with patient reviewing chart, EKGs, labs and examining patient as well as establishing an assessment and plan that was discussed with the patient.  > 50% of time was spent in direct patient care.    Medication Adjustments/Labs and Tests Ordered: Current medicines are reviewed at length with the patient today.  Concerns regarding medicines are outlined above.   Tests Ordered: No orders of the defined types were placed in this encounter.   Medication Changes: No orders of the defined types were placed in this encounter.   Disposition:  Follow up  1 month  after LHC  Signed Dreyah Montrose Verne Spurr, MD, 03/04/2023 10:32 AM    [facility] [address] [place], [place], [place] [address]

## 2023-03-04 NOTE — H&P (View-Only) (Signed)
  Cardiology Office Note  Date: 03/04/2023   ID: Misty Mccullough, DOB 11/04/1970, MRN [id number]  PCP:  Nichols, Tonya S, NP  Cardiologist:  Jais Demir P Shrey Boike, MD Electrophysiologist:  None   Reason for Office Visit: Follow-up of chest pains   History of Present Illness: Misty Mccullough is a 52 y.o. female known to have HTN, DM 2, nicotine abuse, chronic diastolic heart failure, COPD is here for follow-up of chest pains.  Patient underwent NM stress test in 2017 due to history of chest pain, there was no evidence of ischemia. Echocardiogram from 11/15/2022 showed LVEF 55 to 60%, mild LVH, DD G1 DD, RV systolic function is normal, mild MR. Patient was having substernal chest tightness/chest pressure x 1 year with exertion, last for a few minutes, initially less frequent but in the last few months, the chest tightness has been occurring every day multiple times throughout the day (especially with exertion). This chest tightness resolves only with rest. She was recently admitted to the hospital in 11/26/2022 and 12/25/2022 with COPD exacerbation and heart failure exacerbation. She currently smokes 1 pack/day, previously used to smoke 2 packs/day. She also has bilateral lower leg claudication, cramps in her calfs when she walks.  She is here for follow-up visit. I started her on Imdur and metoprolol tartrate in the last clinic visit, she had severe headaches with Imdur after which the medication was stopped. Metoprolol was switched to carvedilol due to poorly controlled HTN, by PCP. CT cardiac from 02/2023 showed coronary calcium score of 319.  FFR analysis showed suspect hemodynamically significant stenosis in the distal RCA/proximal PDA, suspect hemodynamic significant stenosis in the mid LCx but the vessel could not be assessed by FFR at this point and is small in caliber, FFR in the distal LAD suggesting moderately significant LAD stenosis but it was not clear where in the LAD the stenosis was located. She  continues to have chest tightness with exertion, multiple times throughout the day and almost 3-4 times per week.  R ABI 0.89 and L ABI 0.76, suggesting mild PAD in the RLE and moderate PAD in the LLE.  Toe brachial index is abnormal in both extremities.  Denies other symptoms of dizziness, palpitations and leg swelling.  Past Medical History:  Diagnosis Date   Bilateral lower extremity edema    CHF (congestive heart failure) (HCC)    COPD (chronic obstructive pulmonary disease) (HCC)    Cough    Diabetes mellitus without complication (HCC)    Gallstones    GERD (gastroesophageal reflux disease)    Heavy cigarette smoker    Hemoglobin A1C between 7% and 9% indicating borderline diabetic control (HCC) 08/2019   Hypercholesteremia    Hyperglycemia    Hypertension    Shortness of breath    Stroke (cerebrum) (HCC)    Vitamin D deficiency 12/2020    Past Surgical History:  Procedure Laterality Date   BIOPSY  02/19/2023   Procedure: BIOPSY;  Surgeon: Rourk, Robert M, MD;  Location: [facility];  Service: Endoscopy;;   COLONOSCOPY WITH PROPOFOL N/A 02/19/2023   Procedure: COLONOSCOPY WITH PROPOFOL;  Surgeon: Rourk, Robert M, MD;  Location: [facility];  Service: Endoscopy;  Laterality: N/A;  8:15 am, asa 3   ESOPHAGOGASTRODUODENOSCOPY (EGD) WITH PROPOFOL N/A 02/19/2023   Procedure: ESOPHAGOGASTRODUODENOSCOPY (EGD) WITH PROPOFOL;  Surgeon: Rourk, Robert M, MD;  Location: [facility];  Service: Endoscopy;  Laterality: N/A;   MALONEY DILATION N/A 02/19/2023   Procedure: MALONEY DILATION;  Surgeon:   Rourk, Robert M, MD;  Location: [facility];  Service: Endoscopy;  Laterality: N/A;   TUBAL LIGATION      Current Outpatient Medications  Medication Sig Dispense Refill   albuterol (PROVENTIL) (2.5 MG/3ML) 0.083% nebulizer solution Take 3 mLs (2.5 mg total) by nebulization every 4 (four) hours as needed for wheezing or shortness of breath. 120 mL 2   albuterol (VENTOLIN HFA) 108 (90 Base)  MCG/ACT inhaler Inhale 2 puffs into the lungs every 6 (six) hours as needed for wheezing or shortness of breath. 1 each 11   aspirin EC 81 MG tablet Take 1 tablet (81 mg total) by mouth daily with breakfast. Swallow whole. 30 tablet 12   Aspirin-Caffeine (BC FAST PAIN RELIEF ARTHRITIS PO) Take 1 packet by mouth daily as needed (pain.). (Patient not taking: Reported on 02/20/2023)     atorvastatin (LIPITOR) 80 MG tablet Take 1 tablet (80 mg total) by mouth daily. 90 tablet 3   blood glucose meter kit and supplies KIT Dispense based on patient and insurance preference. Use up to four times daily as directed. 1 each 1   carvedilol (COREG) 12.5 MG tablet Take 1 tablet (12.5 mg total) by mouth 2 (two) times daily with a meal. 60 tablet 3   Continuous Glucose Sensor (FREESTYLE LIBRE 3 SENSOR) MISC Place 1 sensor on the skin every 14 days. Use to check glucose continuously 6 each 1   dicyclomine (BENTYL) 10 MG capsule Take 1 capsule (10 mg total) by mouth 4 (four) times daily -  before meals and at bedtime. 120 capsule 3   empagliflozin (JARDIANCE) 10 MG TABS tablet Take 1 tablet (10 mg total) by mouth daily before breakfast. 90 tablet 1   fluticasone-salmeterol (ADVAIR) 100-50 MCG/ACT AEPB Inhale 1 puff into the lungs 2 (two) times daily. 180 each 1   folic acid (FOLVITE) 800 MCG tablet Take 1 tablet (800 mcg total) by mouth daily. 30 tablet 2   furosemide (LASIX) 40 MG tablet Take 1.5 tablets (60 mg total) by mouth daily. (Patient taking differently: Take 60 mg by mouth daily at 12 noon.) 135 tablet 0   ibuprofen (ADVIL) 200 MG tablet Take 800 mg by mouth every 8 (eight) hours as needed (pain.).     insulin glargine (LANTUS) 100 UNIT/ML Solostar Pen Inject 28 Units into the skin daily. (Patient taking differently: Inject 21 Units into the skin at bedtime.) 15 mL 1   lisinopril (ZESTRIL) 40 MG tablet Take 1 tablet (40 mg total) by mouth daily. 90 tablet 1   metFORMIN (GLUCOPHAGE-XR) 500 MG 24 hr tablet Take  1 tablet (500 mg total) by mouth 2 (two) times daily with a meal. (Patient taking differently: Take 1,000 mg by mouth 2 (two) times daily with a meal.) 180 tablet 1   Nebulizers (COMPRESSOR/NEBULIZER) MISC 1 Units by Does not apply route as needed. 1 each 0   omeprazole (PRILOSEC) 20 MG capsule Take 1 capsule (20 mg total) by mouth daily. (Patient not taking: Reported on 02/20/2023) 90 capsule 1   PAIN RELIEVING LIDOCAINE EX Apply 1 Application topically daily.     SV IRON 325 (65 Fe) MG tablet Take 1 tablet by mouth once daily 90 tablet 0   Tiotropium Bromide Monohydrate (SPIRIVA RESPIMAT) 2.5 MCG/ACT AERS Inhale 2 puffs into the lungs daily. 12 g 1   No current facility-administered medications for this visit.   Allergies:  Patient has no known allergies.   Social History: The patient  reports that she   has been smoking cigarettes. She has a 32.00 pack-year smoking history. She has never used smokeless tobacco. She reports that she does not currently use alcohol. She reports that she does not use drugs.   Family History: The patient's family history includes Bone cancer in her paternal uncle; Cirrhosis in her brother; Diabetes in her father and mother; Heart attack in her paternal grandfather, paternal grandmother, and paternal uncle; Heart attack (age of onset: 62) in her father; Ovarian cancer in her maternal grandmother; Stroke in her father and sister; Uterine cancer in her mother.   ROS:  Please see the history of present illness. Otherwise, complete review of systems is positive for none  All other systems are reviewed and negative.   Physical Exam: VS:  LMP  (LMP Unknown) , BMI There is no height or weight on file to calculate BMI.  Wt Readings from Last 3 Encounters:  02/18/23 157 lb 9.6 oz (71.5 kg)  02/11/23 157 lb 9.6 oz (71.5 kg)  01/27/23 153 lb (69.4 kg)    General: Patient appears comfortable at rest. HEENT: Conjunctiva and lids normal, oropharynx clear with moist  mucosa. Neck: Supple, no elevated JVP or carotid bruits, no thyromegaly. Lungs: Clear to auscultation, nonlabored breathing at rest. Cardiac: Regular rate and rhythm, no S3 or significant systolic murmur, no pericardial rub. Abdomen: Soft, nontender, no hepatomegaly, bowel sounds present, no guarding or rebound. Extremities: No pitting edema Skin: Warm and dry. Musculoskeletal: No kyphosis. Neuropsychiatric: Alert and oriented x3, affect grossly appropriate.  Recent Labwork: 12/21/2022: Magnesium 2.1 01/18/2023: B Natriuretic Peptide 628.3 01/20/2023: ALT 11; AST 15; BUN 12; Creatinine 0.89; Hemoglobin 9.9; Platelet Count 201; Potassium 3.4; Sodium 139 02/11/2023: TSH 1.546     Component Value Date/Time   CHOL 234 (H) 12/18/2022 1424   TRIG 62 12/18/2022 1424   HDL 57 12/18/2022 1424   CHOLHDL 4.1 12/18/2022 1424   CHOLHDL 5.8 12/16/2017 0348   VLDL 17 12/16/2017 0348   LDLCALC 167 (H) 12/18/2022 1424    Other Studies Reviewed Today: CT cardiac in 02/2023 1. Left Main: No significant stenosis. 2. LAD: FFR drops to 0.7 in the distal LAD (but 0.82 in the mid LAD). It is not clear that the calcified proximal LAD stenosis is severe, but there is a significant FFR drop by the distal LAD. 3. LCX: FFR could not be performed on the mid to distal LCx. 4. RCA: FFR 0.7 proximal PDA, suggesting hemodynamic signficance.  Echocardiogram on 11/15/2022 LVEF 55 to 60% G1 DD RV systolic function is normal Mild MR  NM stress test in 2017 Horizontal ST segment depression ST segment depression of 0.5 mm was noted during stress in the II, III, aVF, V5 and V6 leads. No T wave inversion was noted during stress. There is a small defect of mild severity present in the apical anterior and apical septal location. The defect is non-reversible and consistent with breast attenuation artifact. This is a low risk study. The left ventricular ejection fraction is normal (55-65%). Nuclear stress EF:  60%.  Assessment and Plan: Patient is a 52-year-old F known to have HTN, DM 2, nicotine abuse, COPD, chronic diastolic heart failure was referred to cardiology clinic for evaluation of CHF.  # Positive CCTA with CCS II-III angina -NM stress test in 2017 showed no evidence of ischemia but CCTA in 02/2023 showed flow-limiting lesion in the proximal LAD and proximal PDA/distal RCA. Patient had substernal exertional chest tightness/chest pressure with exertion that started 1 year ago initially less   frequent but in the last few months, occurring 3-4 times per week and multiple times throughout the day. Last for a few minutes and resolves with rest. Did not tolerate Imdur due to severe headaches. Metoprolol was switched to carvedilol due to poorly controlled HTN.  EKG today showed normal QTc, will start Ranexa 500 mg twice daily.  Patient will benefit from invasive ischemia evaluation with LHC due to positive CCTA and CCS class II-III angina. Risks and benefits of cardiac catheterization have been discussed with the patient.  These include bleeding, infection, kidney damage, stroke, heart attack, death.  The patient understands these risks and is willing to proceed. Patient is scheduled for LHC with Dr. Varanasi on 03/07/2023. -Continue aspirin 81 mg once daily and atorvastatin 80 mg nightly (increased the dose from 10 mg to 80 mg a few weeks ago).  # Bilateral lower extremity PAD (R ABI 0.89 and L ABI 0.76 with abnormal toe brachial index bilaterally): Continue aspirin 81 mg once daily, atorvastatin 80 mg nightly, lisinopril 40 mg once daily, smoking cessation counseling. Patient will benefit from supervised exercise therapy for PAD after LHC.  # History of CVA: Continue aspirin 81 mg once daily and atorvastatin 80 mg nightly.,  Goal LDL less than 55.  # HLD: Continue atorvastatin 80 mg nightly (increased the dose from 10 mg to 80 mg a few weeks ago), goal LDL less than 55.  Repeat lipid panel in 3  months.  # Chronic diastolic heart failure, compensated: Continue p.o. Lasix 60 mg once daily  # HTN, controlled: Continue lisinopril 40 mg once daily, carvedilol 12.5 mg twice daily, Lasix 60 mg once daily.  # Nicotine abuse: Patient cut down smoking to 1 pack/day (previously was smoking 2 packs/day).  Smoking cessation counseling provided, cutting down cigarettes. Smoking cessation instruction/counseling given:  counseled patient on the dangers of tobacco use, advised patient to stop smoking, and reviewed strategies to maximize success   # Mild MR by 2024 Echo: Repeat echocardiogram in 3 years, in 2027.  I have spent a total of 30 minutes with patient reviewing chart, EKGs, labs and examining patient as well as establishing an assessment and plan that was discussed with the patient.  > 50% of time was spent in direct patient care.    Medication Adjustments/Labs and Tests Ordered: Current medicines are reviewed at length with the patient today.  Concerns regarding medicines are outlined above.   Tests Ordered: No orders of the defined types were placed in this encounter.   Medication Changes: No orders of the defined types were placed in this encounter.   Disposition:  Follow up  1 month  after LHC  Signed Amritpal Shropshire Priya Kalandra Masters, MD, 03/04/2023 10:32 AM    [facility] [place] [address], [place], [place] [address] 

## 2023-03-06 ENCOUNTER — Telehealth: Payer: Self-pay | Admitting: *Deleted

## 2023-03-06 NOTE — Telephone Encounter (Addendum)
Cardiac Catheterization scheduled at [facility] for: Friday Mar 07, 2023 7:30 AM Arrival time [facility] Main Entrance A at: 5:30 AM  Nothing to eat after midnight prior to procedure, clear liquids until 5 AM day of procedure.  Medication instructions: -Hold:  Metformin-day of procedure and 48 hours post procedure  Jardiance-AM of procedure  Insulin-1/2 usual Insulin dose HS prior to procedure  Lasix-AM of procedure  -Other usual morning medications can be taken with sips of water including aspirin 81 mg.  Confirmed patient has responsible adult to drive home post procedure and be with patient first 24 hours after arriving home.  Plan to go home the same day, you will only stay overnight if medically necessary.  Reviewed procedure instructions with patient.

## 2023-03-07 ENCOUNTER — Other Ambulatory Visit: Payer: Self-pay

## 2023-03-07 ENCOUNTER — Encounter (HOSPITAL_COMMUNITY)
Admission: RE | Disposition: A | Discharge status: Home / Self Care | Payer: Self-pay | Source: Home / Self Care | Attending: Interventional Cardiology

## 2023-03-07 ENCOUNTER — Ambulatory Visit (HOSPITAL_BASED_OUTPATIENT_CLINIC_OR_DEPARTMENT_OTHER): Payer: Medicaid Other

## 2023-03-07 ENCOUNTER — Encounter (HOSPITAL_COMMUNITY): Payer: Self-pay | Admitting: Interventional Cardiology

## 2023-03-07 ENCOUNTER — Ambulatory Visit (HOSPITAL_COMMUNITY)
Admission: RE | Admit: 2023-03-07 | Discharge: 2023-03-07 | Disposition: A | Discharge status: Home / Self Care | Payer: Medicaid Other | Attending: Interventional Cardiology | Admitting: Interventional Cardiology

## 2023-03-07 DIAGNOSIS — I721 Aneurysm of artery of upper extremity: Secondary | ICD-10-CM | POA: Diagnosis not present

## 2023-03-07 DIAGNOSIS — Z833 Family history of diabetes mellitus: Secondary | ICD-10-CM | POA: Insufficient documentation

## 2023-03-07 DIAGNOSIS — I110 Hypertensive heart disease with heart failure: Secondary | ICD-10-CM | POA: Insufficient documentation

## 2023-03-07 DIAGNOSIS — F17210 Nicotine dependence, cigarettes, uncomplicated: Secondary | ICD-10-CM | POA: Diagnosis not present

## 2023-03-07 DIAGNOSIS — E1151 Type 2 diabetes mellitus with diabetic peripheral angiopathy without gangrene: Secondary | ICD-10-CM | POA: Diagnosis not present

## 2023-03-07 DIAGNOSIS — Z8249 Family history of ischemic heart disease and other diseases of the circulatory system: Secondary | ICD-10-CM | POA: Insufficient documentation

## 2023-03-07 DIAGNOSIS — I2584 Coronary atherosclerosis due to calcified coronary lesion: Secondary | ICD-10-CM | POA: Diagnosis not present

## 2023-03-07 DIAGNOSIS — Z7984 Long term (current) use of oral hypoglycemic drugs: Secondary | ICD-10-CM | POA: Diagnosis not present

## 2023-03-07 DIAGNOSIS — I25118 Atherosclerotic heart disease of native coronary artery with other forms of angina pectoris: Secondary | ICD-10-CM | POA: Diagnosis not present

## 2023-03-07 DIAGNOSIS — J449 Chronic obstructive pulmonary disease, unspecified: Secondary | ICD-10-CM | POA: Diagnosis not present

## 2023-03-07 DIAGNOSIS — Z794 Long term (current) use of insulin: Secondary | ICD-10-CM | POA: Diagnosis not present

## 2023-03-07 DIAGNOSIS — I5032 Chronic diastolic (congestive) heart failure: Secondary | ICD-10-CM | POA: Diagnosis not present

## 2023-03-07 HISTORY — PX: LEFT HEART CATH AND CORONARY ANGIOGRAPHY: CATH118249

## 2023-03-07 LAB — GLUCOSE, CAPILLARY
Glucose-Capillary: 243 mg/dL — ABNORMAL HIGH (ref 70–99)
Glucose-Capillary: 188 mg/dL — ABNORMAL HIGH (ref 70–99)

## 2023-03-07 SURGERY — LEFT HEART CATH AND CORONARY ANGIOGRAPHY
Anesthesia: LOCAL

## 2023-03-07 MED ORDER — SODIUM CHLORIDE 0.9% FLUSH: 3.0000 mL | Freq: Two times a day (BID) | INTRAVENOUS | Status: DC

## 2023-03-07 MED ORDER — ACETAMINOPHEN 325 MG PO TABS: 650.0000 mg | ORAL_TABLET | ORAL | Status: DC | PRN

## 2023-03-07 MED ORDER — LIDOCAINE HCL (PF) 1 % IJ SOLN
INTRAMUSCULAR | Status: DC | PRN
  Administered 2023-03-07: 2 mL via INTRADERMAL

## 2023-03-07 MED ORDER — HEPARIN (PORCINE) IN NACL 1000-0.9 UT/500ML-% IV SOLN
INTRAVENOUS | Status: DC | PRN
  Administered 2023-03-07 (×2): 500 mL

## 2023-03-07 MED ORDER — HEPARIN SODIUM (PORCINE) 1000 UNIT/ML IJ SOLN
INTRAMUSCULAR | Status: DC | PRN
  Administered 2023-03-07: 3000 [IU] via INTRAVENOUS

## 2023-03-07 MED ORDER — ONDANSETRON HCL 4 MG/2ML IJ SOLN: 4.0000 mg | Freq: Four times a day (QID) | INTRAMUSCULAR | Status: DC | PRN

## 2023-03-07 MED ORDER — HYDRALAZINE HCL 20 MG/ML IJ SOLN: 10.0000 mg | INTRAMUSCULAR | Status: DC | PRN

## 2023-03-07 MED ORDER — LABETALOL HCL 5 MG/ML IV SOLN: 10.0000 mg | INTRAVENOUS | Status: DC | PRN

## 2023-03-07 MED ORDER — METFORMIN HCL ER 500 MG PO TB24: 500.0000 mg | ORAL_TABLET | Freq: Two times a day (BID) | ORAL | 1 refills | Status: AC

## 2023-03-07 MED ORDER — HEPARIN SODIUM (PORCINE) 1000 UNIT/ML IJ SOLN
INTRAMUSCULAR | Status: AC
  Filled 2023-03-07: qty 10

## 2023-03-07 MED ORDER — MIDAZOLAM HCL 2 MG/2ML IJ SOLN
INTRAMUSCULAR | Status: DC | PRN
  Administered 2023-03-07: 1 mg via INTRAVENOUS

## 2023-03-07 MED ORDER — SODIUM CHLORIDE 0.9 % IV SOLN: INTRAVENOUS | Status: AC

## 2023-03-07 MED ORDER — VERAPAMIL HCL 2.5 MG/ML IV SOLN
INTRAVENOUS | Status: AC
  Filled 2023-03-07: qty 2

## 2023-03-07 MED ORDER — SODIUM CHLORIDE 0.9 % WEIGHT BASED INFUSION: 1.0000 mL/kg/h | INTRAVENOUS | Status: DC

## 2023-03-07 MED ORDER — FENTANYL CITRATE (PF) 100 MCG/2ML IJ SOLN
INTRAMUSCULAR | Status: AC
  Filled 2023-03-07: qty 2

## 2023-03-07 MED ORDER — ASPIRIN 81 MG PO CHEW: 81.0000 mg | CHEWABLE_TABLET | ORAL | Status: DC

## 2023-03-07 MED ORDER — SODIUM CHLORIDE 0.9% FLUSH: 3.0000 mL | INTRAVENOUS | Status: DC | PRN

## 2023-03-07 MED ORDER — LIDOCAINE HCL (PF) 1 % IJ SOLN
INTRAMUSCULAR | Status: AC
  Filled 2023-03-07: qty 30

## 2023-03-07 MED ORDER — MIDAZOLAM HCL 2 MG/2ML IJ SOLN
INTRAMUSCULAR | Status: AC
  Filled 2023-03-07: qty 2

## 2023-03-07 MED ORDER — FENTANYL CITRATE (PF) 100 MCG/2ML IJ SOLN
INTRAMUSCULAR | Status: DC | PRN
  Administered 2023-03-07: 25 ug via INTRAVENOUS

## 2023-03-07 MED ORDER — SODIUM CHLORIDE 0.9 % IV SOLN: 250.0000 mL | INTRAVENOUS | Status: DC | PRN

## 2023-03-07 MED ORDER — VERAPAMIL HCL 2.5 MG/ML IV SOLN
INTRAVENOUS | Status: DC | PRN
  Administered 2023-03-07: 10 mL via INTRA_ARTERIAL

## 2023-03-07 MED ORDER — IOHEXOL 350 MG/ML SOLN
INTRAVENOUS | Status: DC | PRN
  Administered 2023-03-07: 40 mL

## 2023-03-07 MED ORDER — MORPHINE SULFATE (PF) 2 MG/ML IV SOLN: 1.0000 mg | INTRAVENOUS | Status: DC | PRN

## 2023-03-07 MED ORDER — SODIUM CHLORIDE 0.9 % WEIGHT BASED INFUSION
3.0000 mL/kg/h | INTRAVENOUS | Status: AC
  Administered 2023-03-07: 3 mL/kg/h via INTRAVENOUS

## 2023-03-07 SURGICAL SUPPLY — 11 items
BAND CMPR LRG ZPHR (HEMOSTASIS) ×1
BAND ZEPHYR COMPRESS 30 LONG (HEMOSTASIS) IMPLANT
CATH 5FR JL3.5 JR4 ANG PIG MP (CATHETERS) IMPLANT
GLIDESHEATH SLEND SS 6F .021 (SHEATH) IMPLANT
GUIDEWIRE INQWIRE 1.5J.035X260 (WIRE) IMPLANT
INQWIRE 1.5J .035X260CM (WIRE) ×1
KIT HEART LEFT (KITS) ×1 IMPLANT
PACK CARDIAC CATHETERIZATION (CUSTOM PROCEDURE TRAY) ×1 IMPLANT
SHEATH PROBE COVER 6X72 (BAG) IMPLANT
TRANSDUCER W/STOPCOCK (MISCELLANEOUS) ×1 IMPLANT
TUBING CIL FLEX 10 FLL-RA (TUBING) ×1 IMPLANT

## 2023-03-07 NOTE — Progress Notes (Signed)
Eldridge Dace, MD at bedside to assess patients R arm. MD stated that it was okay to discharge patient.

## 2023-03-07 NOTE — Interval H&P Note (Signed)
Cath Lab Visit (complete for each Cath Lab visit)  Clinical Evaluation Leading to the Procedure:   ACS: Yes.    Non-ACS:    Anginal Classification: CCS IV  Anti-ischemic medical therapy: Minimal Therapy (1 class of medications)  Non-Invasive Test Results: Intermediate-risk stress test findings: cardiac mortality 1-3%/year  Prior CABG: No previous CABG      History and Physical Interval Note:  03/07/2023 7:34 AM  Misty Mccullough  has presented today for surgery, with the diagnosis of abnormal cta - angina.  The various methods of treatment have been discussed with the patient and family. After consideration of risks, benefits and other options for treatment, the patient has consented to  Procedure(s): LEFT HEART CATH AND CORONARY ANGIOGRAPHY (N/A) as a surgical intervention.  The patient's history has been reviewed, patient examined, no change in status, stable for surgery.  I have reviewed the patient's chart and labs.  Questions were answered to the patient's satisfaction.     Lance Muss

## 2023-03-07 NOTE — Progress Notes (Signed)
TR BAND REMOVAL  LOCATION:    right ulnar  DEFLATED PER PROTOCOL:    Yes.    TIME BAND OFF / DRESSING APPLIED: 03/07/23 at 1300   SITE UPON ARRIVAL:    Level 2  SITE AFTER BAND REMOVAL:    Level 1  CIRCULATION SENSATION AND MOVEMENT:    Within Normal Limits   Yes.    COMMENTS:

## 2023-03-07 NOTE — Progress Notes (Signed)
Results of [place] reported to Dr. Eldridge Dace who stated that it was okay to start pulling air from TR band slowly. Orders followed. Will continue to monitor.

## 2023-03-07 NOTE — Progress Notes (Signed)
At 0810, patient returned to Procedural Short Stay from Cath Lab with a large R forearm hematoma. Eric, RCIS held pressure and the hematoma of the forearm post radial access protocol was started. At 57, this RN resumed care from [place], [place] and readings are as follows:  0824-5=140 mmHg 0828-5=135 mmHg 0832-5=130 mmHg 0837-5=125 mmHg 0842-5=120 mmHg 0844-5=115 mmHg 0846-5=110 mmHg 0848-5=105 mmHg 0850-5=100 mmHg 0852-5=95 mmHg 0854-5=90 mmHg 0856-5=85 mmHg 0858-5=80 mmHg 0900-5=75 mmHg 0902-5=70 mmHg 0904-5=65 mmHg 0906-5=60 mmHg 0908-5=55 mmHg  0910-5=50 mmHg 0912-5=45 mmHg 0914-5=40 mmHg 0916-5=35 mmHg 0918-5=30 mmHg 0920-5=25 mmHg 0922-5=20 mmHg 0924-5=15 mmHg 0926-5=10 mmHg 0928-5=5 mmHg  0930-5=0 mmHg  Pressure held to distal site of TR band for additional 15 minutes. Hematoma at site softened. Ultrasound at bedside at 0945 to conduct exam. Will continue to monitor.

## 2023-03-08 DIAGNOSIS — J42 Unspecified chronic bronchitis: Secondary | ICD-10-CM | POA: Diagnosis not present

## 2023-03-10 ENCOUNTER — Inpatient Hospital Stay: Payer: Medicaid Other | Attending: Nurse Practitioner

## 2023-03-10 ENCOUNTER — Encounter: Payer: Self-pay | Admitting: Hematology

## 2023-03-10 ENCOUNTER — Other Ambulatory Visit: Payer: Self-pay

## 2023-03-10 ENCOUNTER — Inpatient Hospital Stay: Payer: Medicaid Other

## 2023-03-10 ENCOUNTER — Inpatient Hospital Stay (HOSPITAL_BASED_OUTPATIENT_CLINIC_OR_DEPARTMENT_OTHER): Payer: Medicaid Other | Admitting: Hematology

## 2023-03-10 VITALS — BP 149/59 | HR 69 | Temp 98.0°F | Resp 18 | Ht 67.0 in | Wt 139.3 lb

## 2023-03-10 DIAGNOSIS — D519 Vitamin B12 deficiency anemia, unspecified: Secondary | ICD-10-CM | POA: Diagnosis not present

## 2023-03-10 DIAGNOSIS — Z1211 Encounter for screening for malignant neoplasm of colon: Secondary | ICD-10-CM

## 2023-03-10 DIAGNOSIS — R790 Abnormal level of blood mineral: Secondary | ICD-10-CM

## 2023-03-10 LAB — CBC WITH DIFFERENTIAL (CANCER CENTER ONLY)
Abs Immature Granulocytes: 0 10*3/uL (ref 0.00–0.07)
Basophils Absolute: 0 10*3/uL (ref 0.0–0.1)
Basophils Relative: 1 %
Eosinophils Absolute: 0.1 10*3/uL (ref 0.0–0.5)
Eosinophils Relative: 2 %
HCT: 33.2 % — ABNORMAL LOW (ref 36.0–46.0)
Hemoglobin: 10.8 g/dL — ABNORMAL LOW (ref 12.0–15.0)
Immature Granulocytes: 0 %
Lymphocytes Relative: 26 %
Lymphs Abs: 1.1 10*3/uL (ref 0.7–4.0)
MCH: 26.3 pg (ref 26.0–34.0)
MCHC: 32.5 g/dL (ref 30.0–36.0)
MCV: 81 fL (ref 80.0–100.0)
Monocytes Absolute: 0.3 10*3/uL (ref 0.1–1.0)
Monocytes Relative: 7 %
Neutro Abs: 2.9 10*3/uL (ref 1.7–7.7)
Neutrophils Relative %: 64 %
Platelet Count: 169 10*3/uL (ref 150–400)
RBC: 4.1 MIL/uL (ref 3.87–5.11)
RDW: 16.7 % — ABNORMAL HIGH (ref 11.5–15.5)
WBC Count: 4.4 10*3/uL (ref 4.0–10.5)
nRBC: 0 % (ref 0.0–0.2)

## 2023-03-10 LAB — VITAMIN B12: Vitamin B-12: 152 pg/mL — ABNORMAL LOW (ref 180–914)

## 2023-03-10 MED ORDER — CYANOCOBALAMIN 1000 MCG/ML IJ SOLN
1000.0000 ug | Freq: Once | INTRAMUSCULAR | Status: AC
  Administered 2023-03-10: 1000 ug via INTRAMUSCULAR
  Filled 2023-03-10: qty 1

## 2023-03-10 MED ORDER — CYANOCOBALAMIN 1000 MCG/ML IJ SOLN: 1000.0000 ug | INTRAMUSCULAR | 0 refills | Status: DC

## 2023-03-10 MED ORDER — SYRINGE 2-3 ML 3 ML MISC: 1.0000 mL | 0 refills | Status: AC

## 2023-03-10 NOTE — Assessment & Plan Note (Signed)
-  She developed mild anemia in 2020 that normalized in 2022, and recurred/progressed since 11/2021 hgb 10.8 -She had acute anemia during recent hospitalizations for PNA, COPD and CHF exacerbations, lowest Hgb 7.3 -B12 was normal in 2022; iron studies 12/2022 showed ferritin 25 and 10% saturation. She was started on oral iron and folic acid 12/2022 -lab on 01/30/2023 showed low B12 at 137 -will start her on B12 injection  -I ordered intrinsic factor today, results still pending.

## 2023-03-10 NOTE — Patient Instructions (Signed)
Vitamin B12 Deficiency Vitamin B12 deficiency occurs when the body does not have enough of this important vitamin. The body needs this vitamin: To make red blood cells. To make DNA. This is the genetic material inside cells. To help the nerves work properly so they can carry messages from the brain to the body. Vitamin B12 deficiency can cause health problems, such as not having enough red blood cells in the blood (anemia). This can lead to nerve damage if untreated. What are the causes? This condition may be caused by: Not eating enough foods that contain vitamin B12. Not having enough stomach acid and digestive fluids to properly absorb vitamin B12 from the food that you eat. Having certain diseases that make it hard to absorb vitamin B12. These diseases include Crohn's disease, chronic pancreatitis, and cystic fibrosis. An autoimmune disorder in which the body does not make enough of a protein (intrinsic factor) within the stomach, resulting in not enough absorption of vitamin B12. Having a surgery in which part of the stomach or small intestine is removed. Taking certain medicines that make it hard for the body to absorb vitamin B12. These include: Heartburn medicines, such as antacids and proton pump inhibitors. Some medicines that are used to treat diabetes. What increases the risk? The following factors may make you more likely to develop a vitamin B12 deficiency: Being an older adult. Eating a vegetarian or vegan diet that does not include any foods that come from animals. Eating a poor diet while you are pregnant. Taking certain medicines. Having alcoholism. What are the signs or symptoms? In some cases, there are no symptoms of this condition. If the condition leads to anemia or nerve damage, various symptoms may occur, such as: Weakness. Tiredness (fatigue). Loss of appetite. Numbness or tingling in your hands and feet. Redness and burning of the tongue. Depression,  confusion, or memory problems. Trouble walking. If anemia is severe, symptoms can include: Shortness of breath. Dizziness. Rapid heart rate. How is this diagnosed? This condition may be diagnosed with a blood test to measure the level of vitamin B12 in your blood. You may also have other tests, including: A group of tests that measure certain characteristics of blood cells (complete blood count, CBC). A blood test to measure intrinsic factor. A procedure where a thin tube with a camera on the end is used to look into your stomach or intestines (endoscopy). Other tests may be needed to discover the cause of the deficiency. How is this treated? Treatment for this condition depends on the cause. This condition may be treated by: Changing your eating and drinking habits, such as: Eating more foods that contain vitamin B12. Drinking less alcohol or no alcohol. Getting vitamin B12 injections. Taking vitamin B12 supplements by mouth (orally). Your health care provider will tell you which dose is best for you. Follow these instructions at home: Eating and drinking  Include foods in your diet that come from animals and contain a lot of vitamin B12. These include: Meats and poultry. This includes beef, pork, chicken, turkey, and organ meats, such as liver. Seafood. This includes clams, rainbow trout, salmon, tuna, and haddock. Eggs. Dairy foods such as milk, yogurt, and cheese. Eat foods that have vitamin B12 added to them (are fortified), such as ready-to-eat breakfast cereals. Check the label on the package to see if a food is fortified. The items listed above may not be a complete list of foods and beverages you can eat and drink. Contact a dietitian for   more information. Alcohol use Do not drink alcohol if: Your health care provider tells you not to drink. You are pregnant, may be pregnant, or are planning to become pregnant. If you drink alcohol: Limit how much you have to: 0-1 drink a  day for women. 0-2 drinks a day for men. Know how much alcohol is in your drink. In the [place] one drink equals one 12 oz bottle of beer (355 mL), one 5 oz glass of wine (148 mL), or one 1 oz glass of hard liquor (44 mL). General instructions Get vitamin B12 injections if told to by your health care provider. Take supplements only as told by your health care provider. Follow the directions carefully. Keep all follow-up visits. This is important. Contact a health care provider if: Your symptoms come back. Your symptoms get worse or do not improve with treatment. Get help right away: You develop shortness of breath. You have a rapid heart rate. You have chest pain. You become dizzy or you faint. These symptoms may be an emergency. Get help right away. Call 911. Do not wait to see if the symptoms will go away. Do not drive yourself to the hospital. Summary Vitamin B12 deficiency occurs when the body does not have enough of this important vitamin. Common causes include not eating enough foods that contain vitamin B12, not being able to absorb vitamin B12 from the food that you eat, having a surgery in which part of the stomach or small intestine is removed, or taking certain medicines. Eat foods that have vitamin B12 in them. Treatment may include making a change in the way you eat and drink, getting vitamin B12 injections, or taking vitamin B12 supplements. This information is not intended to replace advice given to you by your health care provider. Make sure you discuss any questions you have with your health care provider. Document Revised: 05/18/2021 Document Reviewed: 05/18/2021 Elsevier Patient Education  2024 [employer]  

## 2023-03-10 NOTE — Progress Notes (Signed)
[facility]   [phone]) [phone] [phone]   Clinic Follow up Note   Patient Care Team: Misty Andrew, NP as PCP - General (Pulmonary Disease) Misty Bicker, MD as PCP - Cardiology (Cardiology) Misty Mccullough, RPH-CPP (Pharmacist)  Date of Service:  03/10/2023  CHIEF COMPLAINT: f/u of anemia, B12 deficiency   CURRENT THERAPY:  Will start B12 injection today   ASSESSMENT:  Misty Mccullough is a 52 y.o. female with   Anemia due to vitamin B12 deficiency -She developed mild anemia in 2020 that normalized in 2022, and recurred/progressed since 11/2021 hgb 10.8 -She had acute anemia during recent hospitalizations for PNA, COPD and CHF exacerbations, lowest Hgb 7.3 -B12 was normal in 2022; iron studies 12/2022 showed ferritin 25 and 10% saturation. She was started on oral iron and folic acid 12/2022 -lab on 01/30/2023 showed low B12 at 137 -will start her on B12 injection  -I ordered intrinsic factor today, results still pending. -She lives in [place], she is agreeable to have B12 injection at home.   PLAN: - B12 injection today as a loading dose, she will continue at home weekly for 3 more doses, then changed to monthly - reviewed labs from last visit - go to [facility] for next labs in 3 months - f/u in 6 months - will call in B12 injections to pharmacy     INTERVAL HISTORY:  Misty Mccullough is here for a follow up of Anemia. Patient was last seen by Santiago Glad NP on 01/20/2023. She presents to clinic alone. Patient is feeling better. She is taking an iron supplement. Her B12 level is low.   All other systems were reviewed with the patient and are negative.  MEDICAL HISTORY:  Past Medical History:  Diagnosis Date   Bilateral lower extremity edema    CHF (congestive heart failure) (HCC)    COPD (chronic obstructive pulmonary disease) (HCC)    Cough    Diabetes mellitus without complication (HCC)    Gallstones    GERD (gastroesophageal  reflux disease)    Heavy cigarette smoker    Hemoglobin A1C between 7% and 9% indicating borderline diabetic control (HCC) 08/2019   Hypercholesteremia    Hyperglycemia    Hypertension    Shortness of breath    Stroke (cerebrum) (HCC)    Vitamin D deficiency 12/2020    SURGICAL HISTORY: Past Surgical History:  Procedure Laterality Date   BIOPSY  02/19/2023   Procedure: BIOPSY;  Surgeon: Corbin Ade, MD;  Location: [facility];  Service: Endoscopy;;   COLONOSCOPY WITH PROPOFOL N/A 02/19/2023   Procedure: COLONOSCOPY WITH PROPOFOL;  Surgeon: Corbin Ade, MD;  Location: [facility];  Service: Endoscopy;  Laterality: N/A;  8:15 am, asa 3   ESOPHAGOGASTRODUODENOSCOPY (EGD) WITH PROPOFOL N/A 02/19/2023   Procedure: ESOPHAGOGASTRODUODENOSCOPY (EGD) WITH PROPOFOL;  Surgeon: Corbin Ade, MD;  [facility]: [facility];  Service: Endoscopy;  Laterality: N/A;   LEFT HEART CATH AND CORONARY ANGIOGRAPHY N/A 03/07/2023   Procedure: LEFT HEART CATH AND CORONARY ANGIOGRAPHY;  Surgeon: Corky Crafts, MD;  Location: [facility] INVASIVE CV LAB;  Service: Cardiovascular;  Laterality: N/A;   MALONEY DILATION N/A 02/19/2023   Procedure: Elease Hashimoto DILATION;  Surgeon: Corbin Ade, MD;  Location: [facility];  Service: Endoscopy;  Laterality: N/A;   TUBAL LIGATION      I have reviewed the social history and family history with the patient and they are unchanged from previous note.  ALLERGIES:  has No Known Allergies.  MEDICATIONS:  Current Outpatient Medications  Medication Sig Dispense Refill   cyanocobalamin (VITAMIN B12) 1000 MCG/ML injection Inject 1 mL (1,000 mcg total) into the skin once a week. 1ml injection weeklyX4 then once a month 10 mL 0   Syringe, Disposable, (2-3CC SYRINGE) 3 ML MISC 1 mL by Does not apply route once a week for 10 doses. 10 each 0   albuterol (PROVENTIL) (2.5 MG/3ML) 0.083% nebulizer solution Take 3 mLs (2.5 mg total) by nebulization every 4 (four) hours as  needed for wheezing or shortness of breath. 120 mL 2   albuterol (VENTOLIN HFA) 108 (90 Base) MCG/ACT inhaler Inhale 2 puffs into the lungs every 6 (six) hours as needed for wheezing or shortness of breath. 1 each 11   aspirin EC 81 MG tablet Take 1 tablet (81 mg total) by mouth daily with breakfast. Swallow whole. 30 tablet 12   Aspirin-Caffeine (BC FAST PAIN RELIEF ARTHRITIS PO) Take 1 packet by mouth daily as needed (pain.).     atorvastatin (LIPITOR) 80 MG tablet Take 1 tablet (80 mg total) by mouth daily. 90 tablet 3   blood glucose meter kit and supplies KIT Dispense based on patient and insurance preference. Use up to four times daily as directed. 1 each 1   carvedilol (COREG) 12.5 MG tablet Take 1 tablet (12.5 mg total) by mouth 2 (two) times daily with a meal. 60 tablet 3   Continuous Glucose Sensor (FREESTYLE LIBRE 3 SENSOR) MISC Place 1 sensor on the skin every 14 days. Use to check glucose continuously 6 each 1   dicyclomine (BENTYL) 10 MG capsule Take 1 capsule (10 mg total) by mouth 4 (four) times daily -  before meals and at bedtime. 120 capsule 3   empagliflozin (JARDIANCE) 10 MG TABS tablet Take 1 tablet (10 mg total) by mouth daily before breakfast. 90 tablet 1   fluticasone-salmeterol (ADVAIR) 100-50 MCG/ACT AEPB Inhale 1 puff into the lungs 2 (two) times daily. 180 each 1   folic acid (FOLVITE) 800 MCG tablet Take 1 tablet (800 mcg total) by mouth daily. 30 tablet 2   furosemide (LASIX) 40 MG tablet Take 1.5 tablets (60 mg total) by mouth daily. (Patient taking differently: Take 60 mg by mouth daily at 12 noon.) 135 tablet 0   ibuprofen (ADVIL) 200 MG tablet Take 800 mg by mouth every 8 (eight) hours as needed (pain.).     insulin glargine (LANTUS) 100 UNIT/ML injection Inject 21 Units into the skin at bedtime.     lisinopril (ZESTRIL) 40 MG tablet Take 1 tablet (40 mg total) by mouth daily. 90 tablet 1   metFORMIN (GLUCOPHAGE-XR) 500 MG 24 hr tablet Take 1 tablet (500 mg total)  by mouth 2 (two) times daily with a meal. 180 tablet 1   Nebulizers (COMPRESSOR/NEBULIZER) MISC 1 Units by Does not apply route as needed. 1 each 0   omeprazole (PRILOSEC) 20 MG capsule Take 1 capsule (20 mg total) by mouth daily. 90 capsule 1   PAIN RELIEVING LIDOCAINE EX Apply 1 Application topically daily.     ranolazine (RANEXA) 500 MG 12 hr tablet Take 1 tablet (500 mg total) by mouth 2 (two) times daily. 60 tablet 3   SV IRON 325 (65 Fe) MG tablet Take 1 tablet by mouth once daily 90 tablet 0   Tiotropium Bromide Monohydrate (SPIRIVA RESPIMAT) 2.5 MCG/ACT AERS Inhale 2 puffs into the lungs daily. 12 g 1   No  current facility-administered medications for this visit.    PHYSICAL EXAMINATION: ECOG PERFORMANCE STATUS: 1 - Symptomatic but completely ambulatory  Vitals:   03/10/23 1348  BP: (!) 149/59  Pulse: 69  Resp: 18  Temp: 98 F (36.7 C)  SpO2: 99%   Wt Readings from Last 3 Encounters:  03/10/23 139 lb 4.8 oz (63.2 kg)  03/07/23 139 lb (63 kg)  03/04/23 139 lb (63 kg)     GENERAL:alert, no distress and comfortable SKIN: skin color, texture, turgor are normal, no rashes or significant lesions EYES: normal, Conjunctiva are pink and non-injected, sclera clear NECK: supple, thyroid normal size, non-tender, without nodularity LYMPH:  no palpable lymphadenopathy in the cervical, axillary  LUNGS: clear to auscultation and percussion with normal breathing effort HEART: regular rate & rhythm and no murmurs and no lower extremity edema ABDOMEN:abdomen soft, non-tender and normal bowel sounds Musculoskeletal:no cyanosis of digits and no clubbing  NEURO: alert & oriented x 3 with fluent speech, no focal motor/sensory deficits  LABORATORY DATA:  I have reviewed the data as listed    Latest Ref Rng & Units 03/10/2023    1:16 PM 03/04/2023    1:20 PM 01/20/2023    1:38 PM  CBC  WBC 4.0 - 10.5 K/uL 4.4  5.2  4.1   Hemoglobin 12.0 - 15.0 g/dL 16.1  09.6  9.9   Hematocrit 36.0 -  46.0 % 33.2  35.7  31.5    31.8   Platelets 150 - 400 K/uL 169  201  201         Latest Ref Rng & Units 03/04/2023    1:20 PM 01/20/2023    1:38 PM 01/18/2023    1:29 AM  CMP  Glucose 70 - 99 mg/dL 045  409  811   BUN 6 - 20 mg/dL 28  12  11    Creatinine 0.44 - 1.00 mg/dL 9.14  7.82  9.56   Sodium 135 - 145 mmol/L 138  139  138   Potassium 3.5 - 5.1 mmol/L 4.0  3.4  3.4   Chloride 98 - 111 mmol/L 99  102  103   CO2 22 - 32 mmol/L 24  27  22    Calcium 8.9 - 10.3 mg/dL 9.0  8.8  8.6   Total Protein 6.5 - 8.1 g/dL  6.8  5.7   Total Bilirubin 0.3 - 1.2 mg/dL  0.3  0.5   Alkaline Phos 38 - 126 U/L  74  67   AST 15 - 41 U/L  15  14   ALT 0 - 44 U/L  11  8       RADIOGRAPHIC STUDIES: I have personally reviewed the radiological images as listed and agreed with the findings in the report. No results found.    Orders Placed This Encounter  Procedures   Vitamin B12    Standing Status:   Standing    Number of Occurrences:   5    Standing Expiration Date:   03/09/2024   Intrinsic factor antibodies    Standing Status:   Future    Number of Occurrences:   1    Standing Expiration Date:   03/09/2024   All questions were answered. The patient knows to call the clinic with any problems, questions or concerns. No barriers to learning was detected. The total time spent in the appointment was 20 minutes.     Malachy Mood, MD 03/10/2023   I, Sharlette Dense, CMA, am acting as scribe  for Malachy Mood, MD.   I have reviewed the above documentation for accuracy and completeness, and I agree with the above.

## 2023-03-11 LAB — INTRINSIC FACTOR ANTIBODIES: Intrinsic Factor: 1.1 AU/mL (ref 0.0–1.1)

## 2023-03-13 ENCOUNTER — Telehealth: Payer: Self-pay | Admitting: Nurse Practitioner

## 2023-03-13 NOTE — Telephone Encounter (Signed)
I called Misty Mccullough to review CT chest results, which shows improvement, no concerning findings, and cirrhosis. She is undergoing work up per Lewie Loron, NP and a follow up is pending. I reviewed intrinsic factor is normal, she will continue B12 injections for now and hope to transition to oral when here level normalizes.   She is also hoping to get disability due to her dyspnea and "blockages in legs." She has f/up with PCP next week.   Knows to call with heme concerns should they arise.   Santiago Glad, NP

## 2023-03-17 ENCOUNTER — Inpatient Hospital Stay: Payer: Medicaid Other

## 2023-03-19 ENCOUNTER — Ambulatory Visit (INDEPENDENT_AMBULATORY_CARE_PROVIDER_SITE_OTHER): Payer: Medicaid Other | Admitting: Nurse Practitioner

## 2023-03-19 ENCOUNTER — Other Ambulatory Visit: Payer: Self-pay | Admitting: Nurse Practitioner

## 2023-03-19 ENCOUNTER — Other Ambulatory Visit: Payer: Medicaid Other | Admitting: Pharmacist

## 2023-03-19 ENCOUNTER — Other Ambulatory Visit (HOSPITAL_COMMUNITY)
Admission: RE | Admit: 2023-03-19 | Discharge: 2023-03-19 | Disposition: A | Discharge status: Home / Self Care | Payer: Medicaid Other | Source: Ambulatory Visit | Attending: Nurse Practitioner | Admitting: Nurse Practitioner

## 2023-03-19 ENCOUNTER — Encounter: Payer: Self-pay | Admitting: Nurse Practitioner

## 2023-03-19 VITALS — BP 134/59 | HR 73 | Temp 97.0°F | Wt 134.0 lb

## 2023-03-19 DIAGNOSIS — D649 Anemia, unspecified: Secondary | ICD-10-CM

## 2023-03-19 DIAGNOSIS — E118 Type 2 diabetes mellitus with unspecified complications: Secondary | ICD-10-CM | POA: Diagnosis not present

## 2023-03-19 DIAGNOSIS — Z1211 Encounter for screening for malignant neoplasm of colon: Secondary | ICD-10-CM | POA: Diagnosis not present

## 2023-03-19 DIAGNOSIS — R0989 Other specified symptoms and signs involving the circulatory and respiratory systems: Secondary | ICD-10-CM | POA: Diagnosis not present

## 2023-03-19 LAB — POCT GLYCOSYLATED HEMOGLOBIN (HGB A1C): Hemoglobin A1C: 10.2 % — AB (ref 4.0–5.6)

## 2023-03-19 MED ORDER — AMOXICILLIN-POT CLAVULANATE 875-125 MG PO TABS
1.0000 | ORAL_TABLET | Freq: Two times a day (BID) | ORAL | 0 refills | Status: DC
Start: 2023-03-19 — End: 2023-04-29

## 2023-03-19 NOTE — Patient Instructions (Addendum)
1. Diabetes mellitus with complication (HCC)  - POCT glycosylated hemoglobin (Hb A1C)  2. Colon cancer screening  - Cytology - PAP([place])  3. Chest congestion  - amoxicillin-clavulanate (AUGMENTIN) 875-125 MG tablet; Take 1 tablet by mouth 2 (two) times daily.  Dispense: 20 tablet; Refill: 0  Follow up:  Follow up in 3 months

## 2023-03-19 NOTE — Progress Notes (Signed)
@Patient  ID: Misty Mccullough, female    DOB: 1971-07-01, 52 y.o.   MRN: [id number]  Chief Complaint  Patient presents with   Follow-up    Over all health    Referring provider: Ivonne Andrew, NP   HPI  52 year old female with history of PAD, mitral regurgitation, hypertension, chronic diastolic heart failure, CAD, CVA, COPD, chronic bronchitis, pneumonia, dysphagia, diabetes type 2 uncontrolled, tobacco abuse, vitamin B12 deficiency, anemia.  Patient presents today for follow-up visit.  She did recently have blood work through hematology.  She has been started on vitamin B12 injections.  Patient has followed with cardiology and did have a heart cath performed.  Patient is currently on 60 mg of Lasix daily.  It was noted that her diastolic blood pressure was low and patient has lost weight.  I will have pharmacy discussed this with cardiology to see if Lasix needs to be decreased.  Pharmacy is following patient for diabetic medications and hypertension.  We will be starting patient on Ozempic today.  Pharmacy will follow with her to adjust her other diabetic medications.  A1c in office today was 10.2.  Patient states that she does feel like she may be developing pneumonia again she has been having a lot of chest congestion and cough.  We will start her on antibiotic today. Denies f/c/s, n/v/d, hemoptysis, PND, leg swelling Denies chest pain or edema   Note: It is noted in the chart that patient is overdue for Pap.  Patient states that she has not had a Pap smear in over 20 years.  We will check Pap today and call with results.    No Known Allergies  Immunization History  Administered Date(s) Administered   Influenza Whole 06/21/2011   Influenza,inj,Quad PF,6+ Mos 09/06/2016, 08/17/2018, 08/25/2019, 06/29/2021, 11/15/2022   PFIZER Comirnaty(Gray Top)Covid-19 Tri-Sucrose Vaccine 01/15/2020, 02/09/2020   Pneumococcal Conjugate-13 08/25/2019   Pneumococcal Polysaccharide-23 12/06/2016    Pneumococcal-Unspecified 06/21/2011   Tdap 12/06/2016    Past Medical History:  Diagnosis Date   Bilateral lower extremity edema    CHF (congestive heart failure) (HCC)    COPD (chronic obstructive pulmonary disease) (HCC)    Cough    Diabetes mellitus without complication (HCC)    Gallstones    GERD (gastroesophageal reflux disease)    Heavy cigarette smoker    Hemoglobin A1C between 7% and 9% indicating borderline diabetic control (HCC) 08/2019   Hypercholesteremia    Hyperglycemia    Hypertension    Shortness of breath    Stroke (cerebrum) (HCC)    Vitamin D deficiency 12/2020    Tobacco History: Social History   Tobacco Use  Smoking Status Every Day   Packs/day: 1.00   Years: 32.00   Additional pack years: 0.00   Total pack years: 32.00   Types: Cigarettes  Smokeless Tobacco Never  Tobacco Comments   Since age 16.   Ready to quit: Not Answered Counseling given: Not Answered Tobacco comments: Since age 89.   Outpatient Encounter Medications as of 03/19/2023  Medication Sig   albuterol (PROVENTIL) (2.5 MG/3ML) 0.083% nebulizer solution Take 3 mLs (2.5 mg total) by nebulization every 4 (four) hours as needed for wheezing or shortness of breath.   albuterol (VENTOLIN HFA) 108 (90 Base) MCG/ACT inhaler Inhale 2 puffs into the lungs every 6 (six) hours as needed for wheezing or shortness of breath.   amoxicillin-clavulanate (AUGMENTIN) 875-125 MG tablet Take 1 tablet by mouth 2 (two) times daily.   aspirin EC 81 MG  tablet Take 1 tablet (81 mg total) by mouth daily with breakfast. Swallow whole.   Aspirin-Caffeine (BC FAST PAIN RELIEF ARTHRITIS PO) Take 1 packet by mouth daily as needed (pain.).   blood glucose meter kit and supplies KIT Dispense based on patient and insurance preference. Use up to four times daily as directed.   carvedilol (COREG) 12.5 MG tablet Take 1 tablet (12.5 mg total) by mouth 2 (two) times daily with a meal.   Continuous Glucose Sensor  (FREESTYLE LIBRE 3 SENSOR) MISC Place 1 sensor on the skin every 14 days. Use to check glucose continuously   cyanocobalamin (VITAMIN B12) 1000 MCG/ML injection Inject 1 mL (1,000 mcg total) into the skin once a week. 1ml injection weeklyX4 then once a month   dicyclomine (BENTYL) 10 MG capsule Take 1 capsule (10 mg total) by mouth 4 (four) times daily -  before meals and at bedtime.   empagliflozin (JARDIANCE) 10 MG TABS tablet Take 1 tablet (10 mg total) by mouth daily before breakfast.   fluticasone-salmeterol (ADVAIR) 100-50 MCG/ACT AEPB Inhale 1 puff into the lungs 2 (two) times daily.   folic acid (FOLVITE) 800 MCG tablet Take 1 tablet by mouth once daily   furosemide (LASIX) 40 MG tablet Take 1.5 tablets (60 mg total) by mouth daily. (Patient taking differently: Take 60 mg by mouth daily at 12 noon.)   ibuprofen (ADVIL) 200 MG tablet Take 800 mg by mouth every 8 (eight) hours as needed (pain.).   insulin glargine (LANTUS) 100 UNIT/ML injection Inject 21 Units into the skin at bedtime.   lisinopril (ZESTRIL) 40 MG tablet Take 1 tablet (40 mg total) by mouth daily.   metFORMIN (GLUCOPHAGE-XR) 500 MG 24 hr tablet Take 1 tablet (500 mg total) by mouth 2 (two) times daily with a meal.   Nebulizers (COMPRESSOR/NEBULIZER) MISC 1 Units by Does not apply route as needed.   omeprazole (PRILOSEC) 20 MG capsule Take 1 capsule (20 mg total) by mouth daily.   PAIN RELIEVING LIDOCAINE EX Apply 1 Application topically daily.   ranolazine (RANEXA) 500 MG 12 hr tablet Take 1 tablet (500 mg total) by mouth 2 (two) times daily.   SV IRON 325 (65 Fe) MG tablet Take 1 tablet by mouth once daily   Syringe, Disposable, (2-3CC SYRINGE) 3 ML MISC 1 mL by Does not apply route once a week for 10 doses.   Tiotropium Bromide Monohydrate (SPIRIVA RESPIMAT) 2.5 MCG/ACT AERS Inhale 2 puffs into the lungs daily.   atorvastatin (LIPITOR) 80 MG tablet Take 1 tablet (80 mg total) by mouth daily.   No facility-administered  encounter medications on file as of 03/19/2023.     Review of Systems  Review of Systems  Constitutional: Negative.   HENT: Negative.    Respiratory:  Positive for cough, chest tightness and shortness of breath.   Cardiovascular: Negative.   Gastrointestinal: Negative.   Allergic/Immunologic: Negative.   Neurological: Negative.   Psychiatric/Behavioral: Negative.         Physical Exam  BP (!) 134/59   Pulse 73   Temp (!) 97 F (36.1 C)   Wt 134 lb (60.8 kg)   LMP  (LMP Unknown)   SpO2 99%   BMI 20.99 kg/m   Wt Readings from Last 5 Encounters:  03/19/23 134 lb (60.8 kg)  03/10/23 139 lb 4.8 oz (63.2 kg)  03/07/23 139 lb (63 kg)  03/04/23 139 lb (63 kg)  02/18/23 157 lb 9.6 oz (71.5 kg)  Physical Exam Vitals and nursing note reviewed. Exam conducted with a chaperone present.  Constitutional:      General: She is not in acute distress.    Appearance: She is well-developed.  Cardiovascular:     Rate and Rhythm: Normal rate and regular rhythm.  Pulmonary:     Effort: Pulmonary effort is normal.     Breath sounds: Normal breath sounds.  Genitourinary:    General: Normal vulva.     Vagina: Normal.     Cervix: Normal.     Uterus: Normal.      Adnexa: Right adnexa normal.  Neurological:     Mental Status: She is alert and oriented to person, place, and time.      Lab Results:  CBC    Component Value Date/Time   WBC 4.4 03/10/2023 1316   WBC 5.2 03/04/2023 1320   RBC 4.10 03/10/2023 1316   HGB 10.8 (L) 03/10/2023 1316   HGB 8.6 (L) 12/25/2022 1120   HCT 33.2 (L) 03/10/2023 1316   HCT 31.5 (L) 01/20/2023 1338   PLT 169 03/10/2023 1316   PLT 303 12/25/2022 1120   MCV 81.0 03/10/2023 1316   MCV 78 (L) 12/25/2022 1120   MCH 26.3 03/10/2023 1316   MCHC 32.5 03/10/2023 1316   RDW 16.7 (H) 03/10/2023 1316   RDW 16.5 (H) 12/25/2022 1120   LYMPHSABS 1.1 03/10/2023 1316   LYMPHSABS 1.4 11/30/2021 1111   MONOABS 0.3 03/10/2023 1316   EOSABS 0.1  03/10/2023 1316   EOSABS 0.1 11/30/2021 1111   BASOSABS 0.0 03/10/2023 1316   BASOSABS 0.0 11/30/2021 1111    BMET    Component Value Date/Time   NA 138 03/04/2023 1320   NA 140 12/25/2022 1120   K 4.0 03/04/2023 1320   CL 99 03/04/2023 1320   CO2 24 03/04/2023 1320   GLUCOSE 157 (H) 03/04/2023 1320   BUN 28 (H) 03/04/2023 1320   BUN 10 12/25/2022 1120   CREATININE 1.01 (H) 03/04/2023 1320   CREATININE 0.89 01/20/2023 1338   CREATININE 0.62 03/07/2017 0918   CALCIUM 9.0 03/04/2023 1320   GFRNONAA >60 03/04/2023 1320   GFRNONAA >60 01/20/2023 1338   GFRNONAA >89 03/07/2017 0918   GFRAA 124 04/21/2019 1204   GFRAA >89 03/07/2017 0918    BNP    Component Value Date/Time   BNP 628.3 (H) 01/18/2023 0129    ProBNP No results found for: "PROBNP"  Imaging: VAS [place] UPPER EXTREMITY ARTERIAL DUPLEX  Result Date: 03/07/2023  UPPER EXTREMITY DUPLEX STUDY Patient Name:  [facility]  Date of Exam:   03/07/2023 Medical Rec #: [id number]    Accession #:    [id number] Date of Birth: 01-20-1971    Patient Gender: F Patient Age:   54 years Exam Location:  [facility] Procedure:      VAS [place] UPPER EXTREMITY ARTERIAL DUPLEX Referring Phys: Lillia Abed ROBERTS --------------------------------------------------------------------------------  Indications: Bruising and S/P radial cath.  Limitations: T-band in place Performing Technologist: Marilynne Halsted RDMS, RVT  Examination Guidelines: A complete evaluation includes B-mode imaging, spectral Doppler, color Doppler, and power Doppler as needed of all accessible portions of each vessel. Bilateral testing is considered an integral part of a complete examination. Limited examinations for reoccurring indications may be performed as noted.  Right Doppler Findings: +-------------+----------+--------+--------+--------+ Site         PSV (cm/s)WaveformStenosisComments +-------------+----------+--------+--------+--------+ Brachial Dist117                                 +-------------+----------+--------+--------+--------+  Radial Prox  76                                 +-------------+----------+--------+--------+--------+ Radial Mid   63                                 +-------------+----------+--------+--------+--------+ Limited evaluation at site due to T-band in place.  Summary:  Right: No evidence of pseudoaneurysm or AVF proximal or distal to        T-band in right forearm. No evidence of hematoma in right        forearm. *See table(s) above for measurements and observations. Electronically signed by Sherald Hess MD on 03/07/2023 at 11:29:39 AM.    Final    CARDIAC CATHETERIZATION  Result Date: 03/07/2023   Mid RCA lesion is 50% stenosed.   RPDA lesion is 50% stenosed.   Mid LAD lesion is 25% stenosed.   Mid Cx lesion is 25% stenosed.   The left ventricular systolic function is normal.   LV end diastolic pressure is normal.   The left ventricular ejection fraction is 55-65% by visual estimate.   There is no aortic valve stenosis. Mild, diffuse atherosclerosis noted.  Generally small vessels.  No targets for PCI.  Continue medical therapy including smoking cessation as a part of a healthy lifestyle.   CT CORONARY FFR DATA PREP & FLUID ANALYSIS  Result Date: 02/27/2023 EXAM: CT FFR ANALYSIS MEDICATIONS: MEDICATIONS None FINDINGS: CT FFR analysis was performed on the original cardiac computed tomography angiogram dataset. Diagrammatic representation of the CT FFR analysis is provided in a separate PDF document in PACS. This dictation was created using the PDF document and an interactive 3D model of the results. The 3D model is not available in the EMR/PACS. Normal CT FFR range is >0.80. 1. Left Main: No significant stenosis. 2. LAD: FFR drops to 0.7 in the distal LAD (but 0.82 in the mid LAD). It is not clear that the calcified proximal LAD stenosis is severe, but there is a significant FFR drop by the distal LAD. 3. LCX: FFR could not  be performed on the mid to distal LCx. 4. RCA: FFR 0.7 proximal PDA, suggesting hemodynamic signficance. IMPRESSION: 1. Suspect hemodynamically significant stenosis in the distal RCA/proximal PDA. 3. Suspect hemodynamically significant stenosis in the mid LCx but the vessel could not be assessed by FFR at this point and is small in caliber. 4. FFR in the distal LAD suggests hemodynamically significant LAD stenosis. It is not clear where in the LAD this stenosis is located. There is calcified plaque with significant blooming artifact in the proximal LAD but FFR after this lesion is 0.82. The distal LAD is not well-visualized (technically difficult study). Marca Ancona, MD Electronically Signed   By: Marca Ancona M.D.   On: 02/27/2023 08:48   CT CORONARY MORPH W/CTA COR W/SCORE W/CA W/CM &/OR WO/CM  Addendum Date: 02/25/2023   ADDENDUM REPORT: 02/25/2023 10:28 EXAM: OVER-READ INTERPRETATION  CT CHEST The following report is an over-read performed by radiologist Dr. Noe Gens [facility] on 02/25/2023. This over-read does not include interpretation of cardiac or coronary anatomy or pathology. The coronary CTA interpretation by the cardiologist is attached. COMPARISON:  02/14/2023 FINDINGS: Heart is mildly enlarged. Small pericardial effusion. Aorta normal caliber. No adenopathy. Trace right pleural effusion, similar to prior study. Linear areas of scarring in  the lung bases bilaterally most pronounced in the right middle lobe and lingula. No acute findings in the upper abdomen. Chest wall soft tissues are unremarkable. No acute bony abnormality. IMPRESSION: Mild cardiomegaly. Small pericardial effusion. Trace right pleural effusion. Areas of scarring in the lower lungs. Findings stable since prior study. No acute extra cardiac abnormality. Electronically Signed   By: Charlett Nose M.D.   On: 02/25/2023 10:28   Result Date: 02/25/2023 CLINICAL DATA:  Chest pain EXAM: Cardiac CTA MEDICATIONS: Sub  lingual nitro. 4mg  x 2 TECHNIQUE: The patient was scanned on a Siemens 192 slice scanner. Gantry rotation speed was 250 msecs. Collimation was 0.6 mm. A 100 kV prospective scan was triggered in the ascending thoracic aorta at 35-75% of the R-R interval. Average HR during the scan was 60 bpm. The 3D data set was interpreted on a dedicated work station using MPR, MIP and VRT modes. A total of 80cc of contrast was used. FINDINGS: Non-cardiac: See separate report from [facility]. No LA appendage thrombus. Pulmonary veins drain normally to the left atrium. Calcium Score: 319 Agatston units. Coronary Arteries: Right dominant with no anomalies LM: Calcified plaque ostial left main, mild (1-24%) stenosis. LAD system: Calcified plaque in the proximal LAD, significant blooming artifact, cannot rule out severe stenosis but this could be over-estimation. The distal LAD is not well-visualized. FFR drops to 0.7 in the distal LAD (but 0.82 in the mid LAD). It is not clear that the calcified proximal LAD stenosis is severe, but there is a significant FFR drop by the distal LAD. Circumflex system: Small, diffusely diseased OM1. Small caliber, diffusely diseased OM2. Small caliber, diffusely diseased CFx. At least moderate (50-69%) stenosis in the mid LCx. FFR could not be performed on the mid to distal LCx. RCA system: Mixed plaque proximal RCA, mild (25-49%) stenosis. Mixed plaque distal RCA/proximal PDA, suspect severe (>70) stenosis. FFR 0.7 proximal PDA, suggesting hemodynamic signficance. IMPRESSION: 1. Coronary artery calcium score 319 Agatston units. This places the patient in the 99th percentile for age and gender, suggesting high risk for future cardiac events. 2. Suspect hemodynamically significant stenosis in the distal RCA/proximal PDA. 3. Suspect hemodynamically significant stenosis in the mid LCx but the vessel could not be assessed by FFR at this point and is small in caliber. 4. FFR in the distal LAD  suggests hemodynamically significant LAD stenosis. It is not clear where in the LAD this stenosis is located. There is calcified plaque with significant blooming artifact in the proximal LAD but FFR after this lesion is 0.82. The distal LAD is not well-visualized (technically difficult study). Would consider catheterization, especially if patient is symptomatic. [employer] promotion account executive Electronically Signed: By: Marca Ancona M.D. On: 02/24/2023 18:24   [place] ELASTOGRAPHY LIVER  Result Date: 02/25/2023 CLINICAL DATA:  Concern for cirrhosis.  Abnormal ultrasound EXAM: [place] ELASTOGRAPHY HEPATIC TECHNIQUE: Sonography of the liver was performed. In addition, ultrasound elastography evaluation of the liver was performed. A region of interest was placed within the right lobe of the liver. Following application of a compressive sonographic pulse, tissue compressibility was assessed. Multiple assessments were performed at the selected site. Median tissue compressibility was determined. Previously, hepatic stiffness was assessed by shear wave velocity. Based on recently published Society of Radiologists in Ultrasound consensus article, reporting is now recommended to be performed in the SI units of pressure (kiloPascals) representing hepatic stiffness/elasticity. The obtained result is compared to the published reference standards. (cACLD = compensated Advanced Chronic Liver Disease) COMPARISON:  02/04/2023 FINDINGS:  Liver: Heterogeneous, increased echotexture. Lobular contours. Findings suggestive of early cirrhosis. Portal vein is patent on color Doppler imaging with normal direction of blood flow towards the liver. ULTRASOUND HEPATIC ELASTOGRAPHY Device: Siemens Helix VTQ Patient position: Supine Transducer: DAX Number of measurements: 15 Hepatic segment:  8 Median kPa: 10.6 IQR: 6.7 IQR/Median kPa ratio: 0.63 Data quality: i. IQR/Median kPa ratio of 0.6 or greater when using the DAX probe indicates reduced accuracy Diagnostic  category: >9 kPa and ?13 kPa: suggestive of cACLD, but needs further testing The use of hepatic elastography is applicable to patients with viral hepatitis and non-alcoholic fatty liver disease. At this time, there is insufficient data for the referenced cut-off values and use in other causes of liver disease, including alcoholic liver disease. Patients, however, may be assessed by elastography and serve as their own reference standard/baseline. In patients with non-alcoholic liver disease, the values suggesting compensated advanced chronic liver disease (cACLD) may be lower, and patients may need additional testing with elasticity results of 7-9 kPa. Please note that abnormal hepatic elasticity and shear wave velocities may also be identified in clinical settings other than with hepatic fibrosis, such as: acute hepatitis, elevated right heart and central venous pressures including use of beta blockers, veno-occlusive disease (Budd-Chiari), infiltrative processes such as mastocytosis/amyloidosis/infiltrative tumor/lymphoma, extrahepatic cholestasis, with hyperemia in the post-prandial state, and with liver transplantation. Correlation with patient history, laboratory data, and clinical condition recommended. Diagnostic Categories: < or =5 kPa: high probability of being normal < or =9 kPa: in the absence of other known clinical signs, rules out cACLD >9 kPa and ?13 kPa: suggestive of cACLD, but needs further testing >13 kPa: highly suggestive of cACLD > or =17 kPa: highly suggestive of cACLD with an increased probability of clinically significant portal hypertension IMPRESSION: ULTRASOUND LIVER: Heterogeneous, increased echotexture with lobular contour suggests early cirrhosis. ULTRASOUND HEPATIC ELASTOGRAPHY: Median kPa:  10.6 Diagnostic category: >9 kPa and ?13 kPa: suggestive of cACLD, but needs further testing Electronically Signed   By: Charlett Nose M.D.   On: 02/25/2023 10:26     Assessment & Plan:    Diabetes mellitus with complication (HCC) - POCT glycosylated hemoglobin (Hb A1C)  2. Colon cancer screening  - Cytology - PAP([place])  3. Chest congestion  - amoxicillin-clavulanate (AUGMENTIN) 875-125 MG tablet; Take 1 tablet by mouth 2 (two) times daily.  Dispense: 20 tablet; Refill: 0  Follow up:  Follow up in 3 months     Ivonne Andrew, NP 03/19/2023

## 2023-03-19 NOTE — Progress Notes (Signed)
Care Coordination Call  Patient in office with PCP today. Follow up on diabetes, hypertension.   Per [employer], patient just placed a new sensor earlier today. Looking at the past 4 weeks:  Average Glucose: 173 mg/dL Glucose Management Indicator: 7.4  Glucose Variability: 32.3 (goal <36%) Time in Goal:  - Time in range 70-180: 58% - Time above range: 42% - Time below range: 0%   Would recommend initiation of GLP1 with CV risk reduction data, such as Ozempic. PA completed via Cover My Meds. Will follow for outcome. First dose given today in office.   Also noted patients elevated BUN:Scr ratio on last BMP and low diastolic today. Wonder if furosemide needs to be decreased. Will discuss with cardiology.   Catie Eppie Gibson, PharmD, BCACP, CPP [facility] [phone]

## 2023-03-19 NOTE — Assessment & Plan Note (Signed)
-   POCT glycosylated hemoglobin (Hb A1C)  2. Colon cancer screening  - Cytology - PAP([place])  3. Chest congestion  - amoxicillin-clavulanate (AUGMENTIN) 875-125 MG tablet; Take 1 tablet by mouth 2 (two) times daily.  Dispense: 20 tablet; Refill: 0  Follow up:  Follow up in 3 months

## 2023-03-20 ENCOUNTER — Other Ambulatory Visit: Payer: Medicaid Other | Admitting: Pharmacist

## 2023-03-20 MED ORDER — OZEMPIC (0.25 OR 0.5 MG/DOSE) 2 MG/3ML ~~LOC~~ SOPN: 0.5000 mg | PEN_INJECTOR | SUBCUTANEOUS | 2 refills | Status: DC

## 2023-03-25 ENCOUNTER — Other Ambulatory Visit: Payer: Medicaid Other | Admitting: Pharmacist

## 2023-03-25 DIAGNOSIS — I10 Essential (primary) hypertension: Secondary | ICD-10-CM

## 2023-03-25 MED ORDER — LANTUS SOLOSTAR 100 UNIT/ML ~~LOC~~ SOPN: 16.0000 [IU] | PEN_INJECTOR | Freq: Every day | SUBCUTANEOUS | 1 refills | Status: DC

## 2023-03-25 NOTE — Progress Notes (Signed)
03/25/2023 Name: Misty Mccullough MRN: [id number] DOB: May 01, 1971  Chief Complaint  Patient presents with   Medication Management   Diabetes   Hypertension   Hyperlipidemia    Misty Mccullough is a 52 y.o. year old female who presented for a telephone visit.   They were referred to the pharmacist by their PCP for assistance in managing diabetes, hypertension, and hyperlipidemia.    Subjective:  Care Team: Primary Care Provider: Ivonne Andrew, NP ; Next Scheduled Visit: 06/20/23  Medication Access/Adherence  Current Pharmacy:  [facility] [address] [place] - [address] [place] [address] Phone: [phone] Fax: [phone]  [facility] [address] [place] - [address] [place] [address] Phone: [phone] Fax: [phone]   Patient reports affordability concerns with their medications: No  Patient reports access/transportation concerns to their pharmacy: No  Patient reports adherence concerns with their medications:  No     Diabetes:  Current medications: Ozempic 0.25 mg weekly x 1 week, Lantus 21 units daily, Jardiance 10 mg daily, metformin XR 500 mg twice daily  Date of Download: 6/5-6/18/24 % Time CGM is active: 44% Average Glucose: 181 mg/dL Glucose Management Indicator: 7.6  Glucose Variability: 23.5 (goal <36%) Time in Goal:  - Time in range 70-180: 57% - Time above range: 43% - Time below range: 0%   Hyperlipidemia/ASCVD Risk Reduction  Current lipid lowering medications: atorvastatin 80 mg daily  Antiplatelet regimen: aspirin 81 mg daily  Antianginal agents: ranolazine 500 mg twice daily  Heart Failure:  Current medications:  ACEi/ARB/ARNI: lisinopril 40 mg daily SGLT2i: Jardiance 10 mg daily Beta blocker: carvedilol 12.5 mg twice daily Mineralocorticoid Receptor Antagonist: n/a Diuretic regimen: furosemide 60 mg daily  Current home blood pressure readings: reports "normal" readings, but  did not have any numbers for me  Does report symptoms of dizziness, lightheadedness. Notes she thinks it is related to Jardiance, ranolazine, or carvedilol. She takes her first ranolazine and carvedilol doses after she gets off work (~ 1 pm) and second dose later in the evening.   Objective:  Lab Results  Component Value Date   HGBA1C 10.2 (A) 03/19/2023    Lab Results  Component Value Date   CREATININE 1.01 (H) 03/04/2023   BUN 28 (H) 03/04/2023   NA 138 03/04/2023   K 4.0 03/04/2023   CL 99 03/04/2023   CO2 24 03/04/2023    Lab Results  Component Value Date   CHOL 121 03/04/2023   HDL 37 (L) 03/04/2023   LDLCALC 67 03/04/2023   TRIG 86 03/04/2023   CHOLHDL 3.3 03/04/2023    Medications Reviewed Today     Reviewed by Alden Hipp, RPH-CPP (Pharmacist) on 03/25/23 at 1404  Med List Status: <None>   Medication Order Taking? Sig Documenting Provider Last Dose Status Informant  albuterol (PROVENTIL) (2.5 MG/3ML) 0.083% nebulizer solution [id number]  Take 3 mLs (2.5 mg total) by nebulization every 4 (four) hours as needed for wheezing or shortness of breath. Ivonne Andrew, NP  Active Self  albuterol (VENTOLIN HFA) 108 (90 Base) MCG/ACT inhaler [id number]  Inhale 2 puffs into the lungs every 6 (six) hours as needed for wheezing or shortness of breath. Ivonne Andrew, NP  Active Self  amoxicillin-clavulanate (AUGMENTIN) 875-125 MG tablet [id number] Yes Take 1 tablet by mouth 2 (two) times daily. Ivonne Andrew, NP Taking Active   aspirin EC 81 MG tablet [id number]  Take 1 tablet (  81 mg total) by mouth daily with breakfast. Swallow whole. Shon Hale, MD  Active Self  Aspirin-Caffeine (BC FAST PAIN RELIEF ARTHRITIS PO) [id number]  Take 1 packet by mouth daily as needed (pain.). [provider]  Active Self  atorvastatin (LIPITOR) 80 MG tablet [id number] Yes Take 1 tablet (80 mg total) by mouth daily. Mallipeddi, Vishnu P, MD Taking Active   blood glucose meter  kit and supplies KIT [id number]  Dispense based on patient and insurance preference. Use up to four times daily as directed. Orion Crook I, NP  Active Self  carvedilol (COREG) 12.5 MG tablet [id number] Yes Take 1 tablet (12.5 mg total) by mouth 2 (two) times daily with a meal. Ivonne Andrew, NP Taking Active   Continuous Glucose Sensor (FREESTYLE LIBRE 3 SENSOR) [place] [id number] Yes Place 1 sensor on the skin every 14 days. Use to check glucose continuously Ivonne Andrew, NP Taking Active Self  cyanocobalamin (VITAMIN B12) 1000 MCG/ML injection [id number] Yes Inject 1 mL (1,000 mcg total) into the skin once a week. 1ml injection weeklyX4 then once a month Malachy Mood, MD Taking Active   dicyclomine (BENTYL) 10 MG capsule [id number] Yes Take 1 capsule (10 mg total) by mouth 4 (four) times daily -  before meals and at bedtime. Gelene Mink, NP Taking Active Self  empagliflozin (JARDIANCE) 10 MG TABS tablet [id number] Yes Take 1 tablet (10 mg total) by mouth daily before breakfast. Ivonne Andrew, NP Taking Active Self           Med Note Clearance Coots, Alanda Slim Feb 20, 2023  8:58 AM)    fluticasone-salmeterol (ADVAIR) 100-50 MCG/ACT AEPB [id number] Yes Inhale 1 puff into the lungs 2 (two) times daily. Ivonne Andrew, NP Taking Active   folic acid (FOLVITE) 800 MCG tablet [id number] Yes Take 1 tablet by mouth once daily Ivonne Andrew, NP Taking Active   furosemide (LASIX) 40 MG tablet [id number] Yes Take 1.5 tablets (60 mg total) by mouth daily.  Patient taking differently: Take 60 mg by mouth daily at 12 noon.   Mallipeddi, Vishnu P, MD Taking Active Self  ibuprofen (ADVIL) 200 MG tablet [id number]  Take 800 mg by mouth every 8 (eight) hours as needed (pain.). [provider]  Active Self  insulin glargine (LANTUS) 100 UNIT/ML injection [id number] Yes Inject 21 Units into the skin at bedtime. [provider] Taking Active   lisinopril (ZESTRIL) 40 MG tablet [id number]  Take 1  tablet (40 mg total) by mouth daily. Ivonne Andrew, NP  Active Self  metFORMIN (GLUCOPHAGE-XR) 500 MG 24 hr tablet [id number] Yes Take 1 tablet (500 mg total) by mouth 2 (two) times daily with a meal. Corky Crafts, MD Taking Active   Nebulizers (COMPRESSOR/NEBULIZER) MISC [id number]  1 Units by Does not apply route as needed. Shon Hale, MD  Active Self  omeprazole (PRILOSEC) 20 MG capsule [id number] Yes Take 1 capsule (20 mg total) by mouth daily. Ivonne Andrew, NP Taking Active Self  PAIN RELIEVING LIDOCAINE EX [id number] Yes Apply 1 Application topically daily. [provider] Taking Active Self  ranolazine (RANEXA) 500 MG 12 hr tablet [id number] Yes Take 1 tablet (500 mg total) by mouth 2 (two) times daily. Mallipeddi, Vishnu P, MD Taking Active   Semaglutide,0.25 or 0.5MG /DOS, (OZEMPIC, 0.25 OR 0.5 MG/DOSE,) 2 MG/3ML SOPN [id number] Yes Inject 0.5 mg into the skin once a week. Ivonne Andrew, NP Taking Active   SV IRON 325 (  65 Fe) MG tablet [id number]  Take 1 tablet by mouth once daily Ivonne Andrew, NP  Active Self           Med Note Farris Has Feb 17, 2023 10:31 AM) On hold due to upcoming procedure.  Syringe, Disposable, (2-3CC SYRINGE) 3 ML MISC [id number]  1 mL by Does not apply route once a week for 10 doses. Malachy Mood, MD  Active   Tiotropium Bromide Monohydrate (SPIRIVA RESPIMAT) 2.5 MCG/ACT AERS [id number] Yes Inhale 2 puffs into the lungs daily. Ivonne Andrew, NP Taking Active               Assessment/Plan:   Diabetes: - Currently uncontrolled but improving per CGM readings - Reviewed long term cardiovascular and renal outcomes of uncontrolled blood sugar - Reviewed goal A1c, goal fasting, and goal 2 hour post prandial glucose - Continue Ozempic 0.25 mg weekly for a total of 4 weeks. Reduce Lantus to 16 units daily to reduce risk of hypoglycemia. Continue metformin XR 500 mg twice daily and Jardiance 10 mg  daily  Hyperlipidemia/ASCVD Risk Reduction: - Currently uncontrolled but anticipate improvement - Recommend to continue current regimen at this time   Heart Failure: - Currently appropriately managed - Dehydration from furosemide + Jardiance could be causing dizziness, or could be ranolazine. Will discuss dose reduction of furosemide with cardiology   Follow Up Plan: phone call in 2 weeks  Catie Eppie Gibson, PharmD, BCACP, CPP Clinical Pharmacist [facility] [phone]

## 2023-03-26 LAB — CYTOLOGY - PAP: Diagnosis: NEGATIVE

## 2023-03-31 ENCOUNTER — Inpatient Hospital Stay: Payer: Medicaid Other

## 2023-04-03 ENCOUNTER — Encounter: Payer: Self-pay | Admitting: Obstetrics and Gynecology

## 2023-04-03 ENCOUNTER — Other Ambulatory Visit: Payer: Medicaid Other | Admitting: Obstetrics and Gynecology

## 2023-04-03 NOTE — Patient Instructions (Signed)
Hi Misty Mccullough, glad your day is going okay, thanks for talking with me this afternoon!  Misty Mccullough was given information about Medicaid Managed Care team care coordination services as a part of their [facility] Community Plan Medicaid benefit. Misty Mccullough verbally consented to engagement with the [facility] Managed Care team.   If you are experiencing a medical emergency, please call 911 or report to your local emergency department or urgent care.   If you have a non-emergency medical problem during routine business hours, please contact your provider's office and ask to speak with a nurse.   For questions related to your [facility], please call: [phone] or visit the homepage here: [email or web address]  If you would like to schedule transportation through your [facility], please call the following number at least 2 days in advance of your appointment: [phone]   Rides for urgent appointments can also be made after hours by calling Member Services.  Call the Behavioral Health Crisis Line at [phone], at any time, 24 hours a day, 7 days a week. If you are in danger or need immediate medical attention call 911.  If you would like help to quit smoking, call 1-800-QUIT-NOW (867-398-6459) OR Espaol: 1-855-Djelo-Ya (1-324-401-0272) o para ms informacin haga clic aqu or Text READY to 536-644 to register via text  Misty Mccullough - following are the goals we discussed in your visit today:   Goals Addressed    Timeframe:  Long-Range Goal Priority:  High Start Date:  02/28/23                           Expected End Date: ongoing                      Follow Up Date 05/05/23   - schedule appointment for vaccines needed due to my age or health - schedule recommended health tests (blood work, mammogram, colonoscopy, pap test) - schedule and keep appointment for annual check-up     Why is this important?   Screening tests can find diseases early when they are easier to treat.  Your doctor or nurse will talk with you about which tests are important for you.  Getting shots for common diseases like the flu and shingles will help prevent them.   04/03/23:  Patient recently seen by PCP/CARDS/HEME.  Has f/u with CARDS/PULM  Patient verbalizes understanding of instructions and care plan provided today and agrees to view in MyChart. Active MyChart status and patient understanding of how to access instructions and care plan via MyChart confirmed with patient.     The Managed Medicaid care management team will reach out to the patient again over the next 30 business  days.  The  Patient has been provided with contact information for the Managed Medicaid care management team and has been advised to call with any health related questions or concerns.   Kathi Der RN, BSN [place]  [facility] Care Management Coordinator - Managed Medicaid High Risk [phone]   Following is a copy of your plan of care:  Care Plan : RN Care Manager Plan of Care  Updates made by Danie Chandler, RN since 04/03/2023 12:00 AM     Problem: Health Promotion or Disease Self-Management (General Plan of Care)      Long-Range Goal: Chronic Disease Management   Start Date: 02/28/2023  Expected End Date: 05/31/2023  Priority: High  Note:   Current Barriers:  Knowledge Deficits related to plan of care for management of HTN, h/o CVA, CHF, COPD, DM2, HLD, anemia, tobacco use, anxiety,  Chronic Disease Management support and education needs related to HTN, h/o CVA, CHF, COPD, DM2, HLD, anemia, tobacco use, anxiety,  Financial Constraints  04/03/23:  patient states no swelling today, BG 80-170, BP stable.  No swelling.  Has phone call scheduled 7/31 regarding disability.  PULM and CARDS f/u scheduled-to have sleep study and PFTs.  Smoking unchanged.  RNCM Clinical Goal(s):  Patient will  verbalize understanding of plan for management of HTN, h/o CVA, CHF, COPD, DM2, HLD, anemia, tobacco use, anxiety,  as evidenced by patient report verbalize basic understanding of  HTN, h/o CVA, CHF, COPD, DM2, HLD, anemia, tobacco use, anxiety,  disease process and self health management plan as evidenced by patient report take all medications exactly as prescribed and will call provider for medication related questions as evidenced by patient report demonstrate understanding of rationale for each prescribed medication as evidenced by patient report attend all scheduled medical appointments as evidenced by patient report and EMR review. demonstrate ongoing adherence to prescribed treatment plan for HTN, h/o CVA, CHF, COPD, DM2, HLD, anemia, tobacco use, anxiety,  as evidenced by patient report and EMR review. continue to work with RN Care Manager to address care management and care coordination needs related to  HTN, h/o CVA, CHF, COPD, DM2, HLD, anemia, tobacco use, anxiety,  as evidenced by adherence to CM Team Scheduled appointments work with Child psychotherapist to address needed food resources  related to the management of  HTN, h/o CVA, CHF, COPD, DM2, HLD, anemia, tobacco use, anxiety, as evidenced by review of EMR and patient or Child psychotherapist report through collaboration with Medical illustrator, provider, and care team.   Interventions: Inter-disciplinary care team collaboration (see longitudinal plan of care) Evaluation of current treatment plan related to  self management and patient's adherence to plan as established by provider  Heart Failure Interventions:  (Status:  New goal.) Long Term Goal Discussed the importance of keeping all appointments with provider Assessed social determinant of health barriers   COPD Interventions:  (Status:  New goal.) Long Term Goal Advised patient to track and manage COPD triggers Provided instruction about proper use of medications used for management of COPD  including inhalers Advised patient to engage in light exercise as tolerated 3-5 days a week to aid in the the management of COPD Discussed the importance of adequate rest and management of fatigue with COPD Assessed social determinant of health barriers 02/28/23:  patient to f/u with PULM regarding sleep study and PFTs  Diabetes Interventions:  (Status:  New goal.) Long Term Goal Assessed patient's understanding of A1c goal: <7% Reviewed medications with patient and discussed importance of medication adherence Counseled on importance of regular laboratory monitoring as prescribed Discussed plans with patient for ongoing care management follow up and provided patient with direct contact information for care management team Reviewed scheduled/upcoming provider appointments  Advised patient, providing education and rationale, to check cbg as directed and record, calling provider  for findings outside established parameters Review of patient status, including review of consultants reports, relevant laboratory and other test results, and medications completed Assessed social determinant of health barriers Lab Results  Component Value Date   HGBA1C 7.9 (A) 12/18/2022       HGBA1C  10.2                                  03/19/2023  Hyperlipidemia Interventions:  (Status:  New goal.) Long Term Goal Medication review performed; medication list updated in electronic medical record.  Provider established cholesterol goals reviewed Counseled on importance of regular laboratory monitoring as prescribed Reviewed importance of limiting foods high in cholesterol Assessed social determinant of health barriers   Hypertension Interventions:  (Status:  New goal.) Long Term Goal Last practice recorded BP readings:  BP Readings from Last 3 Encounters:  02/24/23 137/71  02/20/23 (!) 180/72  02/19/23 (!) 133/52  03/19/23         134/59  Most recent  eGFR/CrCl:  Lab Results  Component Value Date   EGFR 103 12/25/2022    No components found for: "CRCL"  Evaluation of current treatment plan related to hypertension self management and patient's adherence to plan as established by provider Reviewed medications with patient and discussed importance of compliance Discussed plans with patient for ongoing care management follow up and provided patient with direct contact information for care management team Advised patient, providing education and rationale, to monitor blood pressure daily and record, calling PCP for findings outside established parameters Reviewed scheduled/upcoming provider appointments including:  Assessed social determinant of health barriers  Smoking Cessation Interventions:  (Status:  New goal.) Long Term Goal Reviewed smoking history:  ; currently smoking 1 ppd-decreased from 2 packs a day  Evaluation of current treatment plan reviewed Advised patient to discuss smoking cessation options with provider Reviewed scheduled/upcoming provider appointments  Provided contact information for [place] Quit Line (1-800-QUIT-NOW) Discussed plans with patient for ongoing care management follow up and provided patient with direct contact information for care management team Assessed social determinant of health barriers  Patient Goals/Self-Care Activities: Take all medications as prescribed Attend all scheduled provider appointments Call pharmacy for medication refills 3-7 days in advance of running out of medications Perform all self care activities independently  Perform IADL's (shopping, preparing meals, housekeeping, managing finances) independently Call provider office for new concerns or questions  Work with the social worker to address care coordination needs and will continue to work with the clinical team to address health care and disease management related needs  Follow Up Plan:  The patient has been provided with contact  information for the care management team and has been advised to call with any health related questions or concerns.  The care management team will reach out to the patient again over the next 30 business  days.

## 2023-04-03 NOTE — Patient Outreach (Signed)
Medicaid Managed Care   Nurse Care Manager Note  04/03/2023 Name:  Misty Mccullough MRN:  [id number] DOB:  04-21-1971  Misty Mccullough is an 52 y.o. year old female who is a primary patient of Ivonne Andrew, NP.  The [facility] Managed Care Coordination team was consulted for assistance with:    Chronic healthcare management needs, HTN, h/o CVA, CHF, COPD, DM2, B12 deficiency , tobacco use  [name] was given information about Medicaid Managed Care Coordination team services today. [name] Patient agreed to services and verbal consent obtained.  Engaged with patient by telephone for follow up visit in response to provider referral for case management and/or care coordination services.   Assessments/Interventions:  Review of past medical history, allergies, medications, health status, including review of consultants reports, laboratory and other test data, was performed as part of comprehensive evaluation and provision of chronic care management services.  SDOH (Social Determinants of Health) assessments and interventions performed: SDOH Interventions    Flowsheet Row Patient Outreach Telephone from 04/03/2023 in [place] [facility] Patient Outreach Telephone from 02/28/2023 in [place] [facility] Office Visit from 02/20/2023 in [facility] Office Visit from 12/18/2022 in [facility]  SDOH Interventions      Food Insecurity Interventions -- -- --  Misty Mccullough to RN CM] --  Alcohol Usage Interventions -- Intervention Not Indicated (Score <7) -- --  Depression Interventions/Treatment  -- -- -- NWG9-5 Score <4 Follow-up Not Indicated  Financial Strain Interventions -- -- --  [medication access review] --  Physical Activity Interventions -- Intervention Not Indicated -- --  Stress Interventions Intervention Not Indicated -- -- --     Care Plan  No Known Allergies  Medications Reviewed Today     Reviewed by Danie Chandler, RN (Registered Nurse) on 04/03/23 at 1443  Med List Status: <None>   Medication Order Taking? Sig Documenting Provider Last Dose Status Informant  albuterol (PROVENTIL) (2.5 MG/3ML) 0.083% nebulizer solution [id number] No Take 3 mLs (2.5 mg total) by nebulization every 4 (four) hours as needed for wheezing or shortness of breath. Ivonne Andrew, NP Taking Active Self  albuterol (VENTOLIN HFA) 108 (90 Base) MCG/ACT inhaler [id number] No Inhale 2 puffs into the lungs every 6 (six) hours as needed for wheezing or shortness of breath. Ivonne Andrew, NP Taking Active Self  amoxicillin-clavulanate (AUGMENTIN) 875-125 MG tablet [id number] No Take 1 tablet by mouth 2 (two) times daily. Ivonne Andrew, NP Taking Active   aspirin EC 81 MG tablet [id number] No Take 1 tablet (81 mg total) by mouth daily with breakfast. Swallow whole. Shon Hale, MD Taking Active Self  Aspirin-Caffeine (BC FAST PAIN RELIEF ARTHRITIS PO) [id number] No Take 1 packet by mouth daily as needed (pain.). [provider] Taking Active Self  atorvastatin (LIPITOR) 80 MG tablet [id number] No Take 1 tablet (80 mg total) by mouth daily. Mallipeddi, Vishnu P, MD Taking Active   blood glucose meter kit and supplies KIT [id number] No Dispense based on patient and insurance preference. Use up to four times daily as directed. Orion Crook I, NP Taking Active Self  carvedilol (COREG) 12.5 MG tablet [id number] No Take 1 tablet (12.5 mg total) by mouth 2 (two) times daily with a meal. Ivonne Andrew, NP Taking Active   Continuous Glucose Sensor (FREESTYLE LIBRE 3 SENSOR) MISC [id number] No Place 1 sensor on the skin every 14 days. Use to check glucose continuously Ivonne Andrew,  NP Taking Active Self  cyanocobalamin (VITAMIN B12) 1000 MCG/ML injection [id number] No Inject 1 mL (1,000 mcg total) into the skin once a week. 1ml injection weeklyX4 then once a month Malachy Mood, MD Taking Active   dicyclomine (BENTYL) 10 MG  capsule [id number] No Take 1 capsule (10 mg total) by mouth 4 (four) times daily -  before meals and at bedtime. Gelene Mink, NP Taking Active Self  empagliflozin (JARDIANCE) 10 MG TABS tablet [id number] No Take 1 tablet (10 mg total) by mouth daily before breakfast. Ivonne Andrew, NP Taking Active Self           Med Note Clearance Coots, Alanda Slim Feb 20, 2023  8:58 AM)    fluticasone-salmeterol (ADVAIR) 100-50 MCG/ACT AEPB [id number] No Inhale 1 puff into the lungs 2 (two) times daily. Ivonne Andrew, NP Taking Active   folic acid (FOLVITE) 800 MCG tablet [id number] No Take 1 tablet by mouth once daily Ivonne Andrew, NP Taking Active   furosemide (LASIX) 40 MG tablet [id number] No Take 1.5 tablets (60 mg total) by mouth daily.  Patient taking differently: Take 60 mg by mouth daily at 12 noon.   Mallipeddi, Vishnu P, MD Taking Active Self  ibuprofen (ADVIL) 200 MG tablet [id number] No Take 800 mg by mouth every 8 (eight) hours as needed (pain.). [provider] Taking Active Self  insulin glargine (LANTUS SOLOSTAR) 100 UNIT/ML Solostar Pen [id number]  Inject 16 Units into the skin daily. Ivonne Andrew, NP  Active   lisinopril (ZESTRIL) 40 MG tablet [id number] No Take 1 tablet (40 mg total) by mouth daily. Ivonne Andrew, NP Taking Active Self  metFORMIN (GLUCOPHAGE-XR) 500 MG 24 hr tablet [id number] No Take 1 tablet (500 mg total) by mouth 2 (two) times daily with a meal. Corky Crafts, MD Taking Active   Nebulizers (COMPRESSOR/NEBULIZER) MISC [id number] No 1 Units by Does not apply route as needed. Shon Hale, MD Taking Active Self  omeprazole (PRILOSEC) 20 MG capsule [id number] No Take 1 capsule (20 mg total) by mouth daily. Ivonne Andrew, NP Taking Active Self  PAIN RELIEVING LIDOCAINE EX [id number] No Apply 1 Application topically daily. [provider] Taking Active Self  ranolazine (RANEXA) 500 MG 12 hr tablet [id number] No Take 1 tablet (500 mg total) by  mouth 2 (two) times daily. Mallipeddi, Vishnu P, MD Taking Active   Semaglutide,0.25 or 0.5MG /DOS, (OZEMPIC, 0.25 OR 0.5 MG/DOSE,) 2 MG/3ML SOPN [id number] No Inject 0.5 mg into the skin once a week. Ivonne Andrew, NP Taking Active   SV IRON 325 (65 Fe) MG tablet [id number] No Take 1 tablet by mouth once daily Ivonne Andrew, NP Taking Active Self           Med Note Farris Has Feb 17, 2023 10:31 AM) On hold due to upcoming procedure.  Syringe, Disposable, (2-3CC SYRINGE) 3 ML MISC [id number] No 1 mL by Does not apply route once a week for 10 doses. Malachy Mood, MD Taking Active   Tiotropium Bromide Monohydrate (SPIRIVA RESPIMAT) 2.5 MCG/ACT AERS [id number] No Inhale 2 puffs into the lungs daily. Ivonne Andrew, NP Taking Active            Patient Active Problem List   Diagnosis Date Noted   Anemia due to vitamin B12 deficiency 03/10/2023   CAD (coronary artery disease) 03/04/2023   Mitral regurgitation 03/04/2023   Low ferritin 02/25/2023   Dysphagia 02/11/2023  PAD (peripheral artery disease) (HCC) 01/22/2023   Colon cancer screening 12/25/2022   Sepsis (HCC) 12/20/2022   CAP (community acquired pneumonia) 12/20/2022   COPD with acute exacerbation (HCC) 12/20/2022   AKI (acute kidney injury) (HCC) 12/20/2022   Acute respiratory failure with hypoxia (HCC) 12/19/2022   Chronic diastolic heart failure (HCC) 11/16/2022   Acute exacerbation of Diastolic CHF (congestive heart failure) (HCC) 11/14/2022   Cough 12/13/2021   Chronic bronchitis (HCC) 12/13/2021   Shortness of breath 08/25/2019   Anxiety 08/25/2019   Hyperglycemia 08/25/2019   Hemoglobin A1C between 7% and 9% indicating borderline diabetic control (HCC) 08/25/2019   Type 2 diabetes mellitus without complication, without long-term current use of insulin (HCC) 11/24/2018   Bilateral lower extremity edema 11/24/2018   Pain and swelling of toe, left 11/04/2018   Insomnia 11/04/2018   Tobacco dependence  02/05/2018   H/O: CVA (cerebrovascular accident) 02/05/2018   Tobacco use 12/16/2017   Acute CVA (cerebrovascular accident) (HCC) 12/15/2017   Malnutrition of moderate degree 08/14/2016   Cardiac chest pain 08/13/2016   Back pain 08/13/2016   Essential hypertension 08/13/2016   HLD (hyperlipidemia) 08/13/2016   Diabetes mellitus with complication (HCC) 08/13/2016   Chest pain, exertional    Conditions to be addressed/monitored per PCP order:  Chronic healthcare management needs, HTN, h/o CVA, CHF, COPD, DM2, B12 deficiency, tobacco use  Care Plan : RN Care Manager Plan of Care  Updates made by Danie Chandler, RN since 04/03/2023 12:00 AM     Problem: Health Promotion or Disease Self-Management (General Plan of Care)      Long-Range Goal: Chronic Disease Management   Start Date: 02/28/2023  Expected End Date: 05/31/2023  Priority: High  Note:   Current Barriers:  Knowledge Deficits related to plan of care for management of HTN, h/o CVA, CHF, COPD, DM2, HLD, anemia, tobacco use, anxiety,  Chronic Disease Management support and education needs related to HTN, h/o CVA, CHF, COPD, DM2, HLD, anemia, tobacco use, anxiety,  Financial Constraints  04/03/23:  patient states no swelling today, BG 80-170, BP stable.  No swelling.  Has phone call scheduled 7/31 regarding disability.  PULM and CARDS f/u scheduled-to have sleep study and PFTs.  Smoking unchanged.  RNCM Clinical Goal(s):  Patient will verbalize understanding of plan for management of HTN, h/o CVA, CHF, COPD, DM2, HLD, anemia, tobacco use, anxiety,  as evidenced by patient report verbalize basic understanding of  HTN, h/o CVA, CHF, COPD, DM2, HLD, anemia, tobacco use, anxiety,  disease process and self health management plan as evidenced by patient report take all medications exactly as prescribed and will call provider for medication related questions as evidenced by patient report demonstrate understanding of rationale for each  prescribed medication as evidenced by patient report attend all scheduled medical appointments as evidenced by patient report and EMR review. demonstrate ongoing adherence to prescribed treatment plan for HTN, h/o CVA, CHF, COPD, DM2, HLD, anemia, tobacco use, anxiety,  as evidenced by patient report and EMR review. continue to work with RN Care Manager to address care management and care coordination needs related to  HTN, h/o CVA, CHF, COPD, DM2, HLD, anemia, tobacco use, anxiety,  as evidenced by adherence to CM Team Scheduled appointments work with Child psychotherapist to address needed food resources  related to the management of  HTN, h/o CVA, CHF, COPD, DM2, HLD, anemia, tobacco use, anxiety, as evidenced by review of EMR and patient or Child psychotherapist report through collaboration with RN Care  manager, provider, and care team.   Interventions: Inter-disciplinary care team collaboration (see longitudinal plan of care) Evaluation of current treatment plan related to  self management and patient's adherence to plan as established by provider  Heart Failure Interventions:  (Status:  New goal.) Long Term Goal Discussed the importance of keeping all appointments with provider Assessed social determinant of health barriers   COPD Interventions:  (Status:  New goal.) Long Term Goal Advised patient to track and manage COPD triggers Provided instruction about proper use of medications used for management of COPD including inhalers Advised patient to engage in light exercise as tolerated 3-5 days a week to aid in the the management of COPD Discussed the importance of adequate rest and management of fatigue with COPD Assessed social determinant of health barriers 02/28/23:  patient to f/u with PULM regarding sleep study and PFTs  Diabetes Interventions:  (Status:  New goal.) Long Term Goal Assessed patient's understanding of A1c goal: <7% Reviewed medications with patient and discussed importance of  medication adherence Counseled on importance of regular laboratory monitoring as prescribed Discussed plans with patient for ongoing care management follow up and provided patient with direct contact information for care management team Reviewed scheduled/upcoming provider appointments  Advised patient, providing education and rationale, to check cbg as directed and record, calling provider  for findings outside established parameters Review of patient status, including review of consultants reports, relevant laboratory and other test results, and medications completed Assessed social determinant of health barriers Lab Results  Component Value Date   HGBA1C 7.9 (A) 12/18/2022       HGBA1C                                                      10.2                                  03/19/2023  Hyperlipidemia Interventions:  (Status:  New goal.) Long Term Goal Medication review performed; medication list updated in electronic medical record.  Provider established cholesterol goals reviewed Counseled on importance of regular laboratory monitoring as prescribed Reviewed importance of limiting foods high in cholesterol Assessed social determinant of health barriers   Hypertension Interventions:  (Status:  New goal.) Long Term Goal Last practice recorded BP readings:  BP Readings from Last 3 Encounters:  02/24/23 137/71  02/20/23 (!) 180/72  02/19/23 (!) 133/52  03/19/23         134/59  Most recent eGFR/CrCl:  Lab Results  Component Value Date   EGFR 103 12/25/2022    No components found for: "CRCL"  Evaluation of current treatment plan related to hypertension self management and patient's adherence to plan as established by provider Reviewed medications with patient and discussed importance of compliance Discussed plans with patient for ongoing care management follow up and provided patient with direct contact information for care management team Advised patient, providing education and  rationale, to monitor blood pressure daily and record, calling PCP for findings outside established parameters Reviewed scheduled/upcoming provider appointments including:  Assessed social determinant of health barriers  Smoking Cessation Interventions:  (Status:  New goal.) Long Term Goal Reviewed smoking history:  ; currently smoking 1 ppd-decreased from 2 packs a day  Evaluation of current treatment plan reviewed  Advised patient to discuss smoking cessation options with provider Reviewed scheduled/upcoming provider appointments  Provided contact information for [place] [facility] (1-800-QUIT-NOW) Discussed plans with patient for ongoing care management follow up and provided patient with direct contact information for care management team Assessed social determinant of health barriers  Patient Goals/Self-Care Activities: Take all medications as prescribed Attend all scheduled provider appointments Call pharmacy for medication refills 3-7 days in advance of running out of medications Perform all self care activities independently  Perform IADL's (shopping, preparing meals, housekeeping, managing finances) independently Call provider office for new concerns or questions  Work with the social worker to address care coordination needs and will continue to work with the clinical team to address health care and disease management related needs  Follow Up Plan:  The patient has been provided with contact information for the care management team and has been advised to call with any health related questions or concerns.  The care management team will reach out to the patient again over the next 30 business  days.   Long-Range Goal: Establish Plan of Care for Chronic Disease Management Needs   Priority: High  Note:   Timeframe:  Long-Range Goal Priority:  High Start Date:  02/28/23                           Expected End Date: ongoing                      Follow Up Date 05/05/23   - schedule  appointment for vaccines needed due to my age or health - schedule recommended health tests (blood work, mammogram, colonoscopy, pap test) - schedule and keep appointment for annual check-up    Why is this important?   Screening tests can find diseases early when they are easier to treat.  Your doctor or nurse will talk with you about which tests are important for you.  Getting shots for common diseases like the flu and shingles will help prevent them.   04/03/23:  Patiently recently seen by PCP/CARDS/HEME.  Has f/u with CARDS/PULM   Follow Up:  Patient agrees to Care Plan and Follow-up.  Plan: The Managed Medicaid care management team will reach out to the patient again over the next 30 business  days. and The  Patient has been provided with contact information for the Managed Medicaid care management team and has been advised to call with any health related questions or concerns.  Date/time of next scheduled RN care management/care coordination outreach: 05/05/23 at 230

## 2023-04-07 ENCOUNTER — Inpatient Hospital Stay: Payer: Medicaid Other

## 2023-04-07 DIAGNOSIS — J42 Unspecified chronic bronchitis: Secondary | ICD-10-CM | POA: Diagnosis not present

## 2023-04-14 ENCOUNTER — Ambulatory Visit (INDEPENDENT_AMBULATORY_CARE_PROVIDER_SITE_OTHER): Payer: Medicaid Other | Admitting: Internal Medicine

## 2023-04-14 ENCOUNTER — Ambulatory Visit (HOSPITAL_COMMUNITY)
Admission: RE | Admit: 2023-04-14 | Discharge: 2023-04-14 | Disposition: A | Discharge status: Home / Self Care | Payer: Medicaid Other | Source: Ambulatory Visit | Attending: Pulmonary Disease | Admitting: Pulmonary Disease

## 2023-04-14 ENCOUNTER — Encounter: Payer: Self-pay | Admitting: Internal Medicine

## 2023-04-14 VITALS — BP 172/86 | HR 77 | Ht 67.0 in | Wt 135.0 lb

## 2023-04-14 DIAGNOSIS — I340 Nonrheumatic mitral (valve) insufficiency: Secondary | ICD-10-CM

## 2023-04-14 DIAGNOSIS — J449 Chronic obstructive pulmonary disease, unspecified: Secondary | ICD-10-CM | POA: Diagnosis present

## 2023-04-14 DIAGNOSIS — I10 Essential (primary) hypertension: Secondary | ICD-10-CM | POA: Insufficient documentation

## 2023-04-14 DIAGNOSIS — I739 Peripheral vascular disease, unspecified: Secondary | ICD-10-CM | POA: Diagnosis not present

## 2023-04-14 DIAGNOSIS — E7849 Other hyperlipidemia: Secondary | ICD-10-CM | POA: Insufficient documentation

## 2023-04-14 DIAGNOSIS — I25118 Atherosclerotic heart disease of native coronary artery with other forms of angina pectoris: Secondary | ICD-10-CM | POA: Diagnosis not present

## 2023-04-14 DIAGNOSIS — I639 Cerebral infarction, unspecified: Secondary | ICD-10-CM | POA: Insufficient documentation

## 2023-04-14 DIAGNOSIS — I5032 Chronic diastolic (congestive) heart failure: Secondary | ICD-10-CM | POA: Diagnosis not present

## 2023-04-14 LAB — PULMONARY FUNCTION TEST
DL/VA % pred: 73 %
DL/VA: 3.08 ml/min/mmHg/L
DLCO unc % pred: 54 %
DLCO unc: 12.42 ml/min/mmHg
FEF 25-75 Post: 1.07 L/sec
FEF 25-75 Pre: 1.01 L/sec
FEF2575-%Change-Post: 5 %
FEF2575-%Pred-Post: 38 %
FEF2575-%Pred-Pre: 35 %
FEV1-%Change-Post: 0 %
FEV1-%Pred-Post: 57 %
FEV1-%Pred-Pre: 57 %
FEV1-Post: 1.75 L
FEV1-Pre: 1.74 L
FEV1FVC-%Change-Post: 0 %
FEV1FVC-%Pred-Pre: 86 %
FEV6-%Change-Post: 0 %
FEV6-%Pred-Post: 65 %
FEV6-%Pred-Pre: 65 %
FEV6-Post: 2.45 L
FEV6-Pre: 2.44 L
FEV6FVC-%Change-Post: 0 %
FEV6FVC-%Pred-Post: 99 %
FEV6FVC-%Pred-Pre: 98 %
FVC-%Change-Post: 0 %
FVC-%Pred-Post: 66 %
FVC-%Pred-Pre: 66 %
FVC-Post: 2.54 L
FVC-Pre: 2.54 L
Post FEV1/FVC ratio: 69 %
Post FEV6/FVC ratio: 96 %
Pre FEV1/FVC ratio: 69 %
Pre FEV6/FVC Ratio: 96 %
RV % pred: 169 %
RV: 3.39 L
TLC % pred: 106 %
TLC: 5.85 L

## 2023-04-14 MED ORDER — NITROGLYCERIN 0.4 MG SL SUBL: 0.4000 mg | SUBLINGUAL_TABLET | SUBLINGUAL | 3 refills | Status: AC | PRN

## 2023-04-14 MED ORDER — ALBUTEROL SULFATE (2.5 MG/3ML) 0.083% IN NEBU
2.5000 mg | INHALATION_SOLUTION | Freq: Once | RESPIRATORY_TRACT | Status: AC
  Administered 2023-04-14: 2.5 mg via RESPIRATORY_TRACT

## 2023-04-14 MED ORDER — EZETIMIBE 10 MG PO TABS: 10.0000 mg | ORAL_TABLET | Freq: Every day | ORAL | 1 refills | Status: AC

## 2023-04-14 MED ORDER — CARVEDILOL 25 MG PO TABS: 25.0000 mg | ORAL_TABLET | Freq: Two times a day (BID) | ORAL | 2 refills | Status: DC

## 2023-04-14 NOTE — Progress Notes (Signed)
Cardiology Office Note  Date: 04/14/2023   ID: Misty Mccullough, DOB April 08, 1971, MRN [id number]  PCP:  Ivonne Andrew, NP  Cardiologist:  Marjo Bicker, MD Electrophysiologist:  None   Reason for Office Visit: Follow-up of chest pains   History of Present Illness: Misty Mccullough is a 52 y.o. female known to have HTN, DM 2, nicotine abuse, chronic diastolic heart failure, COPD is here for follow-up of chest pains.  Patient underwent NM stress test in 2017 due to history of chest pain, there was no evidence of ischemia. Echocardiogram from 11/15/2022 showed LVEF 55 to 60%, mild LVH, DD G1 DD, RV systolic function is normal, mild MR. Patient was having substernal chest tightness/chest pressure x 1 year with exertion, last for a few minutes, initially less frequent but in the last few months, the chest tightness has been occurring every day multiple times throughout the day (especially with exertion). This chest tightness resolves only with rest. She was recently admitted to the hospital in 11/26/2022 and 12/25/2022 with COPD exacerbation and heart failure exacerbation. CCTA in 5/24 showed hemodynamically significant stenosis in the distal RCA/proximal PDA, mid LCx and distal LAD.  LHC from 5/24 showed nonobstructive CAD (mid RCA 50%, RPDA 50%, mid LAD 25% and mid LCx 25% stenosis) with normal LVEF.    Patient is here for follow-up visit.  She had 4 episodes of chest pain since last clinic visit.  She is also restricting her activities due to chest pain.  No DOE. no leg swelling, syncope, palpitations.  She does have lower extremity claudication.  Smokes half a pack a day.  Past Medical History:  Diagnosis Date   Bilateral lower extremity edema    CHF (congestive heart failure) (HCC)    COPD (chronic obstructive pulmonary disease) (HCC)    Cough    Diabetes mellitus without complication (HCC)    Gallstones    GERD (gastroesophageal reflux disease)    Heavy cigarette smoker    Hemoglobin A1C  between 7% and 9% indicating borderline diabetic control (HCC) 08/2019   Hypercholesteremia    Hyperglycemia    Hypertension    Shortness of breath    Stroke (cerebrum) (HCC)    Vitamin D deficiency 12/2020    Past Surgical History:  Procedure Laterality Date   BIOPSY  02/19/2023   Procedure: BIOPSY;  Surgeon: Corbin Ade, MD;  [facility]: [facility];  Service: Endoscopy;;   COLONOSCOPY WITH PROPOFOL N/A 02/19/2023   Procedure: COLONOSCOPY WITH PROPOFOL;  Surgeon: Corbin Ade, MD;  [facility]: [facility];  Service: Endoscopy;  Laterality: N/A;  8:15 am, asa 3   ESOPHAGOGASTRODUODENOSCOPY (EGD) WITH PROPOFOL N/A 02/19/2023   Procedure: ESOPHAGOGASTRODUODENOSCOPY (EGD) WITH PROPOFOL;  Surgeon: Corbin Ade, MD;  [facility]: [facility];  Service: Endoscopy;  Laterality: N/A;   LEFT HEART CATH AND CORONARY ANGIOGRAPHY N/A 03/07/2023   Procedure: LEFT HEART CATH AND CORONARY ANGIOGRAPHY;  Surgeon: Corky Crafts, MD;  Location: [facility];  Service: Cardiovascular;  Laterality: N/A;   MALONEY DILATION N/A 02/19/2023   Procedure: Elease Hashimoto DILATION;  Surgeon: Corbin Ade, MD;  Location: [facility];  Service: Endoscopy;  Laterality: N/A;   TUBAL LIGATION      Current Outpatient Medications  Medication Sig Dispense Refill   albuterol (PROVENTIL) (2.5 MG/3ML) 0.083% nebulizer solution Take 3 mLs (2.5 mg total) by nebulization every 4 (four) hours as needed for wheezing or shortness of breath. 120 mL 2   albuterol (  VENTOLIN HFA) 108 (90 Base) MCG/ACT inhaler Inhale 2 puffs into the lungs every 6 (six) hours as needed for wheezing or shortness of breath. 1 each 11   amoxicillin-clavulanate (AUGMENTIN) 875-125 MG tablet Take 1 tablet by mouth 2 (two) times daily. 20 tablet 0   aspirin EC 81 MG tablet Take 1 tablet (81 mg total) by mouth daily with breakfast. Swallow whole. 30 tablet 12   Aspirin-Caffeine (BC FAST PAIN RELIEF ARTHRITIS PO) Take 1 packet by mouth daily as  needed (pain.).     atorvastatin (LIPITOR) 80 MG tablet Take 1 tablet (80 mg total) by mouth daily. 90 tablet 3   blood glucose meter kit and supplies KIT Dispense based on patient and insurance preference. Use up to four times daily as directed. 1 each 1   carvedilol (COREG) 25 MG tablet Take 1 tablet (25 mg total) by mouth 2 (two) times daily with a meal. 180 tablet 2   Continuous Glucose Sensor (FREESTYLE LIBRE 3 SENSOR) MISC Place 1 sensor on the skin every 14 days. Use to check glucose continuously 6 each 1   cyanocobalamin (VITAMIN B12) 1000 MCG/ML injection Inject 1 mL (1,000 mcg total) into the skin once a week. 1ml injection weeklyX4 then once a month 10 mL 0   dicyclomine (BENTYL) 10 MG capsule Take 1 capsule (10 mg total) by mouth 4 (four) times daily -  before meals and at bedtime. 120 capsule 3   empagliflozin (JARDIANCE) 10 MG TABS tablet Take 1 tablet (10 mg total) by mouth daily before breakfast. 90 tablet 1   ezetimibe (ZETIA) 10 MG tablet Take 1 tablet (10 mg total) by mouth daily. 90 tablet 1   fluticasone-salmeterol (ADVAIR) 100-50 MCG/ACT AEPB Inhale 1 puff into the lungs 2 (two) times daily. 180 each 1   folic acid (FOLVITE) 800 MCG tablet Take 1 tablet by mouth once daily 90 tablet 0   furosemide (LASIX) 40 MG tablet Take 1.5 tablets (60 mg total) by mouth daily. (Patient taking differently: Take 60 mg by mouth daily at 12 noon.) 135 tablet 0   ibuprofen (ADVIL) 200 MG tablet Take 800 mg by mouth every 8 (eight) hours as needed (pain.).     insulin glargine (LANTUS SOLOSTAR) 100 UNIT/ML Solostar Pen Inject 16 Units into the skin daily. 15 mL 1   lisinopril (ZESTRIL) 40 MG tablet Take 1 tablet (40 mg total) by mouth daily. 90 tablet 1   metFORMIN (GLUCOPHAGE-XR) 500 MG 24 hr tablet Take 1 tablet (500 mg total) by mouth 2 (two) times daily with a meal. 180 tablet 1   Nebulizers (COMPRESSOR/NEBULIZER) MISC 1 Units by Does not apply route as needed. 1 each 0   nitroGLYCERIN  (NITROSTAT) 0.4 MG SL tablet Place 1 tablet (0.4 mg total) under the tongue every 5 (five) minutes x 3 doses as needed for chest pain (If no relief after 3rd dose proceed to ED or call 911). 25 tablet 3   omeprazole (PRILOSEC) 20 MG capsule Take 1 capsule (20 mg total) by mouth daily. 90 capsule 1   PAIN RELIEVING LIDOCAINE EX Apply 1 Application topically daily.     ranolazine (RANEXA) 500 MG 12 hr tablet Take 1 tablet (500 mg total) by mouth 2 (two) times daily. 60 tablet 3   Semaglutide,0.25 or 0.5MG /DOS, (OZEMPIC, 0.25 OR 0.5 MG/DOSE,) 2 MG/3ML SOPN Inject 0.5 mg into the skin once a week. 3 mL 2   SV IRON 325 (65 Fe) MG tablet Take 1 tablet  by mouth once daily 90 tablet 0   Syringe, Disposable, (2-3CC SYRINGE) 3 ML MISC 1 mL by Does not apply route once a week for 10 doses. 10 each 0   Tiotropium Bromide Monohydrate (SPIRIVA RESPIMAT) 2.5 MCG/ACT AERS Inhale 2 puffs into the lungs daily. 12 g 1   No current facility-administered medications for this visit.   Allergies:  Patient has no known allergies.   Social History: The patient  reports that she has been smoking cigarettes. She has a 32.00 pack-year smoking history. She has never used smokeless tobacco. She reports that she does not currently use alcohol. She reports that she does not use drugs.   Family History: The patient's family history includes Bone cancer in her paternal uncle; Cirrhosis in her brother; Diabetes in her father and mother; Heart attack in her paternal grandfather, paternal grandmother, and paternal uncle; Heart attack (age of onset: 24) in her father; Ovarian cancer in her maternal grandmother; Stroke in her father and sister; Uterine cancer in her mother.   ROS:  Please see the history of present illness. Otherwise, complete review of systems is positive for none  All other systems are reviewed and negative.   Physical Exam: VS:  BP (!) 172/86   Pulse 77   Ht 5\' 7"  (1.702 m)   Wt 135 lb (61.2 kg)   LMP  (LMP  Unknown)   SpO2 95%   BMI 21.14 kg/m , BMI Body mass index is 21.14 kg/m.  Wt Readings from Last 3 Encounters:  04/14/23 135 lb (61.2 kg)  03/19/23 134 lb (60.8 kg)  03/10/23 139 lb 4.8 oz (63.2 kg)    General: Patient appears comfortable at rest. HEENT: Conjunctiva and lids normal, oropharynx clear with moist mucosa. Neck: Supple, no elevated JVP or carotid bruits, no thyromegaly. Lungs: Clear to auscultation, nonlabored breathing at rest. Cardiac: Regular rate and rhythm, no S3 or significant systolic murmur, no pericardial rub. Abdomen: Soft, nontender, no hepatomegaly, bowel sounds present, no guarding or rebound. Extremities: No pitting edema Skin: Warm and dry. Musculoskeletal: No kyphosis. Neuropsychiatric: Alert and oriented x3, affect grossly appropriate.  Recent Labwork: 12/21/2022: Magnesium 2.1 01/18/2023: B Natriuretic Peptide 628.3 01/20/2023: ALT 11; AST 15 02/11/2023: TSH 1.546 03/04/2023: BUN 28; Creatinine, Ser 1.01; Potassium 4.0; Sodium 138 03/10/2023: Hemoglobin 10.8; Platelet Count 169     Component Value Date/Time   CHOL 121 03/04/2023 1320   CHOL 234 (H) 12/18/2022 1424   TRIG 86 03/04/2023 1320   HDL 37 (L) 03/04/2023 1320   HDL 57 12/18/2022 1424   CHOLHDL 3.3 03/04/2023 1320   VLDL 17 03/04/2023 1320   LDLCALC 67 03/04/2023 1320   LDLCALC 167 (H) 12/18/2022 1424    Other Studies Reviewed Today: CT cardiac in 02/2023 1. Left Main: No significant stenosis. 2. LAD: FFR drops to 0.7 in the distal LAD (but 0.82 in the mid LAD). It is not clear that the calcified proximal LAD stenosis is severe, but there is a significant FFR drop by the distal LAD. 3. LCX: FFR could not be performed on the mid to distal LCx. 4. RCA: FFR 0.7 proximal PDA, suggesting hemodynamic signficance.  Echocardiogram on 11/15/2022 LVEF 55 to 60% G1 DD RV systolic function is normal Mild MR  NM stress test in 2017 Horizontal ST segment depression ST segment depression of 0.5 mm  was noted during stress in the II, III, aVF, V5 and V6 leads. No T wave inversion was noted during stress. There is a small  defect of mild severity present in the apical anterior and apical septal location. The defect is non-reversible and consistent with breast attenuation artifact. This is a low risk study. The left ventricular ejection fraction is normal (55-65%). Nuclear stress EF: 60%.  Assessment and Plan:  # Positive CCTA with CCS II angina (4 episode of chest pain in the interim) -CCTA in 5/24 showed hemodynamically significant stenosis in the distal RCA/proximal PDA, mid LCx and distal LAD.  LHC from 5/24 showed nonobstructive CAD (mid RCA 50%, RPDA 50%, mid LAD 25% and mid LCx 25% stenosis) with normal LVEF.  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  SL NTG 0.4 mg as needed. -Continue cardio prudent medications with aspirin 81 mg once daily, atorvastatin 80 mg nightly and add Zetia 10 mg once daily. -ER precautions for chest pain provided.  # Bilateral lower extremity PAD (R ABI 0.89 and L ABI 0.76 with abnormal toe brachial index bilaterally): Continue aspirin 81 mg once daily, atorvastatin 80 mg nightly, lisinopril 40 mg once daily, smoking cessation counseling.  Not a candidate for supervised exercise therapy for PAD due to chest pains.  # History of CVA: Continue cardioprudent medications, aspirin 81 mg once daily and atorvastatin 80 mg at bedtime.  Add Zetia 10 mg once daily.  # HLD not at goal: Continue atorvastatin 80 mg nightly and start Zetia 10 mg once daily. LDL 67 in 6/24. Goal LDL less than 55.  # Chronic diastolic heart failure, compensated: Continue p.o. Lasix 60 mg once daily.  # HTN,  controlled: Continue medications as stated above.  # Nicotine abuse: Patient cut down smoking to 1/2 pack/day (previously was smoking 2 packs/day).  Smoking cessation counseling provided, cutting down cigarettes. Smoking cessation instruction/counseling given:  counseled patient on the dangers of tobacco use, advised patient to stop smoking, and reviewed strategies to maximize success   # Mild MR by 2024 Echo: Repeat echocardiogram in 3 years, in 2027.  I have spent a total of 30 minutes with patient reviewing chart, EKGs, labs and examining patient as well as establishing an assessment and plan that was discussed with the patient.  > 50% of time was spent in direct patient care.    Medication Adjustments/Labs and Tests Ordered: Current medicines are reviewed at length with the patient today.  Concerns regarding medicines are outlined above.   Tests Ordered: No orders of the defined types were placed in this encounter.   Medication Changes: Meds ordered this encounter  Medications   carvedilol (COREG) 25 MG tablet    Sig: Take 1 tablet (25 mg total) by mouth 2 (two) times daily with a meal.    Dispense:  180 tablet    Refill:  2    04/14/2023-Dose increase   nitroGLYCERIN (NITROSTAT) 0.4 MG SL tablet    Sig: Place 1 tablet (0.4 mg total) under the tongue every 5 (five) minutes x 3 doses as needed for chest pain (If no relief after 3rd dose proceed to ED or call 911).    Dispense:  25 tablet    Refill:  3    04/14/2023-New   ezetimibe (ZETIA) 10 MG tablet    Sig: Take 1 tablet (10 mg total) by  mouth daily.    Dispense:  90 tablet    Refill:  1    04/14/2023-New    Disposition:  Follow up 6 months  Signed Rekha Hobbins Verne Spurr, MD, 04/14/2023 10:44 AM    [facility] [address] [place], [place], [place] [address]

## 2023-04-14 NOTE — Patient Instructions (Addendum)
Medication Instructions:  Start taking Nitroglycerin 0.4 mg as needed-Dissolve one under tongue for chest pain every 5 minutes up to 3 doses. If no relief, proceed to ED.  Increase Carvedilol to 25 mg twice a day Start Zetia 10 mg once a day Continue all other medications the same as prescribed  Labwork: None  Testing/Procedures: None  Follow-Up: Your physician recommends that you schedule a follow-up appointment in: 6 months  Any Other Special Instructions Will Be Listed Below (If Applicable).  If you need a refill on your cardiac medications before your next appointment, please call your pharmacy.

## 2023-04-15 ENCOUNTER — Encounter: Payer: Self-pay | Admitting: Pulmonary Disease

## 2023-04-15 ENCOUNTER — Ambulatory Visit (INDEPENDENT_AMBULATORY_CARE_PROVIDER_SITE_OTHER): Payer: Medicaid Other | Admitting: Pulmonary Disease

## 2023-04-15 VITALS — BP 135/66 | HR 80 | Ht 67.0 in | Wt 134.8 lb

## 2023-04-15 DIAGNOSIS — J432 Centrilobular emphysema: Secondary | ICD-10-CM | POA: Diagnosis not present

## 2023-04-15 DIAGNOSIS — Z716 Tobacco abuse counseling: Secondary | ICD-10-CM

## 2023-04-15 MED ORDER — NICOTINE 21 MG/24HR TD PT24: 21.0000 mg | MEDICATED_PATCH | Freq: Every day | TRANSDERMAL | 0 refills | Status: DC

## 2023-04-15 NOTE — Progress Notes (Signed)
[facility]  Chief Complaint  Patient presents with   Follow-up    Past Surgical History:  She  has a past surgical history that includes Tubal ligation; Colonoscopy with propofol (N/A, 02/19/2023); Esophagogastroduodenoscopy (egd) with propofol (N/A, 02/19/2023); maloney dilation (N/A, 02/19/2023); biopsy (02/19/2023); and LEFT HEART CATH AND CORONARY ANGIOGRAPHY (N/A, 03/07/2023).  Past Medical History:  DM type 2, Gallstones, HLD, HTN, Vit D deficiency, CVA, Diastolic CHF, Claudication, Cirrhosis  Constitutional:  BP 135/66   Pulse 80   Ht 5\' 7"  (1.702 m)   Wt 134 lb 12.8 oz (61.1 kg)   LMP  (LMP Unknown)   SpO2 96%   BMI 21.11 kg/m   Brief Summary:  Misty Mccullough is a 52 y.o. 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      Subjective:   Her daughter is a CMA with GI.  PFT showed moderate obstruction, air trapping, and moderate diffusion defect.  CT chest consistent with moderate emphysema.  Also showed cirrhosis.  Breathing better with inhalers.  Still smoking a ppd.  Thinks nicotine patch will help.  Not having sputum, wheeze, chest pain, or hemoptysis.   Physical Exam:   Appearance - well kempt   ENMT - no sinus tenderness, no oral exudate, no LAN, Mallampati 2 airway, no stridor  Respiratory - decreased breath sounds bilaterally, no wheezing or rales  CV - s1s2 regular rate and rhythm, no murmurs  Ext - no clubbing, no edema  Skin - no rashes  Psych - normal mood and affect   Pulmonary testing:  PFT 04/14/23 >> FEV1 1.75 (57%), FEV1% 69, TLC 5.85 (106%), RV 3.39 (169%), DLCO 54%  Chest Imaging:  CT angio chest 12/19/22 >> small bilateral effusions, patchy ASD bilateral CT chest 02/19/23 >> moderate emphysema, basilar scarring, cirrhosis  Sleep Tests:    Cardiac Tests:  Echo 11/15/22 >> EF 60 to 65%, grade 1 DD  Social History:  She  reports that she has been smoking cigarettes. She has a 32.00 pack-year smoking  history. She has never used smokeless tobacco. She reports that she does not currently use alcohol. She reports that she does not use drugs.  Family History:  Her family history includes Bone cancer in her paternal uncle; Cirrhosis in her brother; Diabetes in her father and mother; Heart attack in her paternal grandfather, paternal grandmother, and paternal uncle; Heart attack (age of onset: 67) in her father; Ovarian cancer in her maternal grandmother; Stroke in her father and sister; Uterine cancer in her mother.     Assessment/Plan:   COPD with emphysema. - reviewed PFT and CT chest findings - trelegy was cost prohibitive - continue spiriva 2.5 mcg two puffs bid, and advair 100 one puff bid - prn albuterol - she has a nebulizer - will arrange for alpha 1 antitrypsin level testing - discussed importance of influenza, pneumonia, RSV, and COVID vaccinations  Tobacco abuse. - spent 8 minutes reviewing smoking cessation options - discussed pros/cons of using electronic cigarettes to help with smoking cessation - she will try nicotine patches  Chronic diastolic CHF, Coronary calcification, Peripheral artery disease. - followed by Dr. Luane School with cardiology  Cirrhosis of liver. - followed by Dr. Eula Listen with gastroenterology  Time Spent Involved in Patient Care on Day of Examination:  37 minutes  Follow up:   Patient Instructions  Lab test today  Follow up in 6 months  Medication List:   Allergies as of 04/15/2023   No  Known Allergies      Medication List        Accurate as of April 15, 2023  3:34 PM. If you have any questions, ask your nurse or doctor.          2-3CC SYRINGE 3 ML Misc 1 mL by Does not apply route once a week for 10 doses.   albuterol 108 (90 Base) MCG/ACT inhaler Commonly known as: VENTOLIN HFA Inhale 2 puffs into the lungs every 6 (six) hours as needed for wheezing or shortness of breath.   albuterol (2.5 MG/3ML) 0.083%  nebulizer solution Commonly known as: PROVENTIL Take 3 mLs (2.5 mg total) by nebulization every 4 (four) hours as needed for wheezing or shortness of breath.   amoxicillin-clavulanate 875-125 MG tablet Commonly known as: AUGMENTIN Take 1 tablet by mouth 2 (two) times daily.   aspirin EC 81 MG tablet Take 1 tablet (81 mg total) by mouth daily with breakfast. Swallow whole.   atorvastatin 80 MG tablet Commonly known as: LIPITOR Take 1 tablet (80 mg total) by mouth daily.   BC FAST PAIN RELIEF ARTHRITIS PO Take 1 packet by mouth daily as needed (pain.).   blood glucose meter kit and supplies Kit Dispense based on patient and insurance preference. Use up to four times daily as directed.   carvedilol 25 MG tablet Commonly known as: COREG Take 1 tablet (25 mg total) by mouth 2 (two) times daily with a meal.   Compressor/Nebulizer Misc 1 Units by Does not apply route as needed.   cyanocobalamin 1000 MCG/ML injection Commonly known as: VITAMIN B12 Inject 1 mL (1,000 mcg total) into the skin once a week. 1ml injection weeklyX4 then once a month   dicyclomine 10 MG capsule Commonly known as: BENTYL Take 1 capsule (10 mg total) by mouth 4 (four) times daily -  before meals and at bedtime.   empagliflozin 10 MG Tabs tablet Commonly known as: Jardiance Take 1 tablet (10 mg total) by mouth daily before breakfast.   ezetimibe 10 MG tablet Commonly known as: ZETIA Take 1 tablet (10 mg total) by mouth daily.   fluticasone-salmeterol 100-50 MCG/ACT Aepb Commonly known as: ADVAIR Inhale 1 puff into the lungs 2 (two) times daily.   folic acid 800 MCG tablet Commonly known as: FOLVITE Take 1 tablet by mouth once daily   FreeStyle Libre 3 Sensor Misc Place 1 sensor on the skin every 14 days. Use to check glucose continuously   furosemide 40 MG tablet Commonly known as: LASIX Take 1.5 tablets (60 mg total) by mouth daily. What changed: when to take this   ibuprofen 200 MG  tablet Commonly known as: ADVIL Take 800 mg by mouth every 8 (eight) hours as needed (pain.).   Lantus SoloStar 100 UNIT/ML Solostar Pen Generic drug: insulin glargine Inject 16 Units into the skin daily.   lisinopril 40 MG tablet Commonly known as: ZESTRIL Take 1 tablet (40 mg total) by mouth daily.   metFORMIN 500 MG 24 hr tablet Commonly known as: GLUCOPHAGE-XR Take 1 tablet (500 mg total) by mouth 2 (two) times daily with a meal.   nicotine 21 mg/24hr patch Commonly known as: NICODERM CQ - dosed in mg/24 hours Place 1 patch (21 mg total) onto the skin daily. Started by: Coralyn Helling, MD   nitroGLYCERIN 0.4 MG SL tablet Commonly known as: Nitrostat Place 1 tablet (0.4 mg total) under the tongue every 5 (five) minutes x 3 doses as needed for chest pain (If no  relief after 3rd dose proceed to ED or call 911).   omeprazole 20 MG capsule Commonly known as: PRILOSEC Take 1 capsule (20 mg total) by mouth daily.   Ozempic (0.25 or 0.5 MG/DOSE) 2 MG/3ML Sopn Generic drug: Semaglutide(0.25 or 0.5MG /DOS) Inject 0.5 mg into the skin once a week.   PAIN RELIEVING LIDOCAINE EX Apply 1 Application topically daily.   ranolazine 500 MG 12 hr tablet Commonly known as: Ranexa Take 1 tablet (500 mg total) by mouth 2 (two) times daily.   Spiriva Respimat 2.5 MCG/ACT Aers Generic drug: Tiotropium Bromide Monohydrate Inhale 2 puffs into the lungs daily.   SV Iron 325 (65 FE) MG tablet Generic drug: ferrous sulfate Take 1 tablet by mouth once daily        Signature:  Coralyn Helling, MD [facility] Pulmonary/Critical Care Pager - [phone] 04/15/2023, 3:34 PM

## 2023-04-15 NOTE — Patient Instructions (Signed)
Lab test today Follow up in 6 months 

## 2023-04-18 LAB — ALPHA-1-ANTITRYPSIN PHENOTYP: A-1 Antitrypsin: 169 mg/dL (ref 101–187)

## 2023-04-25 MED ORDER — FUROSEMIDE 40 MG PO TABS: 40.0000 mg | ORAL_TABLET | Freq: Every day | ORAL | 0 refills | Status: AC

## 2023-04-25 NOTE — Addendum Note (Signed)
Addended by: Carmelina Paddock on: 04/25/2023 01:19 PM   Modules accepted: Orders

## 2023-04-25 NOTE — Progress Notes (Signed)
Called patient to inquire about patients symptoms: Stated that she has been experiencing some dizziness every day. Let her know that Dr. Jenene Slicker wanted her to decrease her lasix to 40 mg daily and to have some blood work done as well. Will have done on 04/29/2023 at [facility] when she goes for another appointment. Order placed. Patient verbalized understanding in changes to medication and obtaining blood work

## 2023-04-29 ENCOUNTER — Ambulatory Visit: Payer: Medicaid Other | Admitting: Gastroenterology

## 2023-04-29 ENCOUNTER — Other Ambulatory Visit (HOSPITAL_COMMUNITY)
Admission: RE | Admit: 2023-04-29 | Discharge: 2023-04-29 | Disposition: A | Discharge status: Home / Self Care | Payer: Medicaid Other | Source: Ambulatory Visit | Attending: Gastroenterology | Admitting: Gastroenterology

## 2023-04-29 VITALS — BP 189/65 | HR 70 | Temp 97.0°F | Ht 67.0 in | Wt 136.8 lb

## 2023-04-29 DIAGNOSIS — R197 Diarrhea, unspecified: Secondary | ICD-10-CM | POA: Diagnosis not present

## 2023-04-29 DIAGNOSIS — B1910 Unspecified viral hepatitis B without hepatic coma: Secondary | ICD-10-CM | POA: Diagnosis not present

## 2023-04-29 DIAGNOSIS — K7460 Unspecified cirrhosis of liver: Secondary | ICD-10-CM | POA: Diagnosis not present

## 2023-04-29 DIAGNOSIS — R634 Abnormal weight loss: Secondary | ICD-10-CM | POA: Insufficient documentation

## 2023-04-29 DIAGNOSIS — K529 Noninfective gastroenteritis and colitis, unspecified: Secondary | ICD-10-CM | POA: Insufficient documentation

## 2023-04-29 LAB — HEPATIC FUNCTION PANEL
ALT: 29 U/L (ref 0–44)
AST: 15 U/L (ref 15–41)
Albumin: 2.9 g/dL — ABNORMAL LOW (ref 3.5–5.0)
Alkaline Phosphatase: 86 U/L (ref 38–126)
Bilirubin, Direct: <0.1 mg/dL (ref 0.0–0.2)
Total Bilirubin: 0.3 mg/dL (ref 0.3–1.2)
Total Protein: 6.3 g/dL — ABNORMAL LOW (ref 6.5–8.1)

## 2023-04-29 LAB — PROTIME-INR
INR: 1 (ref 0.8–1.2)
Prothrombin Time: 13.6 seconds (ref 11.4–15.2)

## 2023-04-29 LAB — HEPATITIS B SURFACE ANTIGEN: Hepatitis B Surface Ag: NONREACTIVE

## 2023-04-29 LAB — HEPATITIS A ANTIBODY, TOTAL: hep A Total Ab: REACTIVE — AB

## 2023-04-29 LAB — HEPATITIS B SURFACE ANTIBODY,QUALITATIVE: Hep B S Ab: NONREACTIVE

## 2023-04-29 NOTE — Patient Instructions (Signed)
Please have blood work done today.  We are arranging a special CT scan of your abdomen and blood vessels.  We will see you in 4-6 weeks!  I enjoyed seeing you again today! I value our relationship and want to provide genuine, compassionate, and quality care. You may receive a survey regarding your visit with me, and I welcome your feedback! Thanks so much for taking the time to complete this. I look forward to seeing you again.      Gelene Mink, PhD, ANP-BC [facility]

## 2023-04-29 NOTE — Progress Notes (Unsigned)
Gastroenterology Office Note     Primary Care Physician:  Ivonne Andrew, NP  Primary Gastroenterologist:   Chief Complaint   Chief Complaint  Patient presents with   Follow-up    cirrhosis     History of Present Illness   Misty Mccullough is a 52 y.o. female presenting today in follow-up with a history of   followed by Hematology due to anemia with chronic disease and IDA component.    Diarrhea: since on metformin. Has been on metformin 8 or 9 years. Will have 12 loose stools per day, postprandially. Urgency. Stools used to be 4-5 tmes per day. Now constantly going. Imodium has not helped OTC taking BID. Never has soft stool. Stool looks slimy at times. Negative celiac disease. Normal TSH. Holding metformin didn't help. Diarrhea has improved from prior. 2 stools per day. Certain foods set it off. Like mashed potatoes. Stool is dark on iron.    Lobular contours of liver: with splenomegaly. Normal platelets. May have underlying hepatic steatosis. No ETOH use. Hep C negative in the past. Will order elastography to further characterize. 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 kPa 10.6, indicating possibility of advanced liver disease. No evidence for portal hypertension on recent EGD. I would favor following with serial ultrasounds and AFP to be on the proactive sid   +weight loss. Used to weigh about 220 lbs about 9 years ago and has had slow steady weight loss. 20 lbs down from May. Early satiety. Sometimes pain after eating in lower abdominal pain. No food aversions. No food fear. Stopped fluid pill as was worried this was drying her out. Now just takes as needed. On Ozempic but was started in June 2024. Had lost weight prior to starting Ozempic.      Chronic diarrhea: noted for 8-9 years. Chronically on metformin and feels this has contributed. Suspect could have med effect. Unable to rule out IBS, doubt microscopic colitis, has risk factors for pancreatic insufficiency.  Check celiac serologies due to IDA and TSH. Will start with dicyclomine QID. Await colonoscopy findings and labs to determine further management.    EGD May 2024  Mild Schatzki ring. Dilated. Gastric                            erosions?"status post biopsy                           - Normal duodenal bulb and second portion of the                            duodenum. Negative H.pylori.   Colonoscopy May 2024: normal colon, s/p segmental biopsy. Negative biopsies   NAFLD score indeterminate.    Past Medical History:  Diagnosis Date   Bilateral lower extremity edema    CHF (congestive heart failure) (HCC)    COPD (chronic obstructive pulmonary disease) (HCC)    Cough    Diabetes mellitus without complication (HCC)    Gallstones    GERD (gastroesophageal reflux disease)    Heavy cigarette smoker    Hemoglobin A1C between 7% and 9% indicating borderline diabetic control (HCC) 08/2019   Hypercholesteremia    Hyperglycemia    Hypertension    Shortness of breath    Stroke (cerebrum) (HCC)    Vitamin D deficiency 12/2020    Past Surgical History:  Procedure Laterality Date   BIOPSY  02/19/2023   Procedure: BIOPSY;  Surgeon: Corbin Ade, MD;  Location: [facility];  Service: Endoscopy;;   COLONOSCOPY WITH PROPOFOL N/A 02/19/2023   Procedure: COLONOSCOPY WITH PROPOFOL;  Surgeon: Corbin Ade, MD;  Location: [facility];  Service: Endoscopy;  Laterality: N/A;  8:15 am, asa 3   ESOPHAGOGASTRODUODENOSCOPY (EGD) WITH PROPOFOL N/A 02/19/2023   Procedure: ESOPHAGOGASTRODUODENOSCOPY (EGD) WITH PROPOFOL;  Surgeon: Corbin Ade, MD;  Location: [facility];  Service: Endoscopy;  Laterality: N/A;   LEFT HEART CATH AND CORONARY ANGIOGRAPHY N/A 03/07/2023   Procedure: LEFT HEART CATH AND CORONARY ANGIOGRAPHY;  Surgeon: Corky Crafts, MD;  Location: [facility] INVASIVE CV LAB;  Service: Cardiovascular;  Laterality: N/A;   MALONEY DILATION N/A 02/19/2023   Procedure: Elease Hashimoto DILATION;   Surgeon: Corbin Ade, MD;  Location: [facility];  Service: Endoscopy;  Laterality: N/A;   TUBAL LIGATION      Current Outpatient Medications  Medication Sig Dispense Refill   albuterol (PROVENTIL) (2.5 MG/3ML) 0.083% nebulizer solution Take 3 mLs (2.5 mg total) by nebulization every 4 (four) hours as needed for wheezing or shortness of breath. 120 mL 2   albuterol (VENTOLIN HFA) 108 (90 Base) MCG/ACT inhaler Inhale 2 puffs into the lungs every 6 (six) hours as needed for wheezing or shortness of breath. 1 each 11   aspirin EC 81 MG tablet Take 1 tablet (81 mg total) by mouth daily with breakfast. Swallow whole. 30 tablet 12   Aspirin-Caffeine (BC FAST PAIN RELIEF ARTHRITIS PO) Take 1 packet by mouth daily as needed (pain.).     atorvastatin (LIPITOR) 80 MG tablet Take 1 tablet (80 mg total) by mouth daily. 90 tablet 3   blood glucose meter kit and supplies KIT Dispense based on patient and insurance preference. Use up to four times daily as directed. 1 each 1   carvedilol (COREG) 25 MG tablet Take 1 tablet (25 mg total) by mouth 2 (two) times daily with a meal. 180 tablet 2   Continuous Glucose Sensor (FREESTYLE LIBRE 3 SENSOR) MISC Place 1 sensor on the skin every 14 days. Use to check glucose continuously 6 each 1   cyanocobalamin (VITAMIN B12) 1000 MCG/ML injection Inject 1 mL (1,000 mcg total) into the skin once a week. 1ml injection weeklyX4 then once a month 10 mL 0   dicyclomine (BENTYL) 10 MG capsule Take 1 capsule (10 mg total) by mouth 4 (four) times daily -  before meals and at bedtime. 120 capsule 3   empagliflozin (JARDIANCE) 10 MG TABS tablet Take 1 tablet (10 mg total) by mouth daily before breakfast. 90 tablet 1   ezetimibe (ZETIA) 10 MG tablet Take 1 tablet (10 mg total) by mouth daily. 90 tablet 1   fluticasone-salmeterol (ADVAIR) 100-50 MCG/ACT AEPB Inhale 1 puff into the lungs 2 (two) times daily. 180 each 1   folic acid (FOLVITE) 800 MCG tablet Take 1 tablet by mouth  once daily 90 tablet 0   furosemide (LASIX) 40 MG tablet Take 1 tablet (40 mg total) by mouth daily. 135 tablet 0   ibuprofen (ADVIL) 200 MG tablet Take 800 mg by mouth every 8 (eight) hours as needed (pain.).     insulin glargine (LANTUS SOLOSTAR) 100 UNIT/ML Solostar Pen Inject 16 Units into the skin daily. 15 mL 1   lisinopril (ZESTRIL) 40 MG tablet Take 1 tablet (40 mg total) by mouth daily. 90 tablet 1  metFORMIN (GLUCOPHAGE-XR) 500 MG 24 hr tablet Take 1 tablet (500 mg total) by mouth 2 (two) times daily with a meal. 180 tablet 1   Nebulizers (COMPRESSOR/NEBULIZER) MISC 1 Units by Does not apply route as needed. 1 each 0   nicotine (NICODERM CQ - DOSED IN MG/24 HOURS) 21 mg/24hr patch Place 1 patch (21 mg total) onto the skin daily. 28 patch 0   nitroGLYCERIN (NITROSTAT) 0.4 MG SL tablet Place 1 tablet (0.4 mg total) under the tongue every 5 (five) minutes x 3 doses as needed for chest pain (If no relief after 3rd dose proceed to ED or call 911). 25 tablet 3   omeprazole (PRILOSEC) 20 MG capsule Take 1 capsule (20 mg total) by mouth daily. 90 capsule 1   PAIN RELIEVING LIDOCAINE EX Apply 1 Application topically daily.     ranolazine (RANEXA) 500 MG 12 hr tablet Take 1 tablet (500 mg total) by mouth 2 (two) times daily. 60 tablet 3   Semaglutide,0.25 or 0.5MG /DOS, (OZEMPIC, 0.25 OR 0.5 MG/DOSE,) 2 MG/3ML SOPN Inject 0.5 mg into the skin once a week. 3 mL 2   SV IRON 325 (65 Fe) MG tablet Take 1 tablet by mouth once daily 90 tablet 0   Syringe, Disposable, (2-3CC SYRINGE) 3 ML MISC 1 mL by Does not apply route once a week for 10 doses. 10 each 0   Tiotropium Bromide Monohydrate (SPIRIVA RESPIMAT) 2.5 MCG/ACT AERS Inhale 2 puffs into the lungs daily. 12 g 1   No current facility-administered medications for this visit.    Allergies as of 04/29/2023   (No Known Allergies)    Family History  Problem Relation Age of Onset   Diabetes Mother    Uterine cancer Mother    Diabetes Father     Heart attack Father 91   Stroke Father    Stroke Sister    Cirrhosis Brother        alcohol   Ovarian cancer Maternal Grandmother    Heart attack Paternal Grandmother    Heart attack Paternal Grandfather    Heart attack Paternal Uncle    Bone cancer Paternal Uncle    Colon cancer Neg Hx        unknown   Colon polyps Neg Hx        unknown    Social History   Socioeconomic History   Marital status: Legally Separated    Spouse name: Not on file   Number of children: 5   Years of education: Not on file   Highest education level: Not on file  Occupational History   Not on file  Tobacco Use   Smoking status: Every Day    Current packs/day: 1.00    Average packs/day: 1 pack/day for 32.0 years (32.0 ttl pk-yrs)    Types: Cigarettes   Smokeless tobacco: Never   Tobacco comments:    Since age 31.  Vaping Use   Vaping status: Never Used  Substance and Sexual Activity   Alcohol use: Not Currently   Drug use: No   Sexual activity: Yes    Partners: Male  Other Topics Concern   Not on file  Social History Narrative   Not on file   Social Determinants of Health   Financial Resource Strain: Medium Risk (02/20/2023)   Overall Financial Resource Strain (CARDIA)    Difficulty of Paying Living Expenses: Somewhat hard  Food Insecurity: Food Insecurity Present (02/20/2023)   Hunger Vital Sign    Worried About Running  Out of Food in the Last Year: Sometimes true    Ran Out of Food in the Last Year: Sometimes true  Transportation Needs: No Transportation Needs (12/20/2022)   PRAPARE - Administrator, Civil Service (Medical): No    Lack of Transportation (Non-Medical): No  Physical Activity: Insufficiently Active (02/28/2023)   Exercise Vital Sign    Days of Exercise per Week: 4 days    Minutes of Exercise per Session: 30 min  Stress: No Stress Concern Present (04/03/2023)   [employer] of Occupational Health - Occupational Stress Questionnaire    Feeling of Stress  : Only a little  Social Connections: Socially Isolated (04/03/2023)   Social Connection and Isolation Panel [NHANES]    Frequency of Communication with Friends and Family: More than three times a week    Frequency of Social Gatherings with Friends and Family: More than three times a week    Attends Religious Services: Never    Database administrator or Organizations: No    Attends Banker Meetings: Never    Marital Status: Separated  Intimate Partner Violence: Not At Risk (12/20/2022)   Humiliation, Afraid, Rape, and Kick questionnaire    Fear of Current or Ex-Partner: No    Emotionally Abused: No    Physically Abused: No    Sexually Abused: No     Review of Systems   Gen: Denies any fever, chills, fatigue, weight loss, lack of appetite.  CV: Denies chest pain, heart palpitations, peripheral edema, syncope.  Resp: Denies shortness of breath at rest or with exertion. Denies wheezing or cough.  GI: Denies dysphagia or odynophagia. Denies jaundice, hematemesis, fecal incontinence. GU : Denies urinary burning, urinary frequency, urinary hesitancy MS: Denies joint pain, muscle weakness, cramps, or limitation of movement.  Derm: Denies rash, itching, dry skin Psych: Denies depression, anxiety, memory loss, and confusion Heme: Denies bruising, bleeding, and enlarged lymph nodes.   Physical Exam   BP (!) 194/68 (BP Location: Right Arm, Patient Position: Sitting, Cuff Size: Normal)   Pulse 70   Temp (!) 97 F (36.1 C) (Temporal)   Ht 5\' 7"  (1.702 m)   Wt 136 lb 12.8 oz (62.1 kg)   LMP  (LMP Unknown)   SpO2 96%   BMI 21.43 kg/m  General:   Alert and oriented. Pleasant and cooperative. Well-nourished and well-developed.  Head:  Normocephalic and atraumatic. Eyes:  Without icterus Abdomen:  +BS, soft, non-tender and non-distended. No HSM noted. No guarding or rebound. No masses appreciated.  Rectal:  Deferred  Msk:  Symmetrical without gross deformities. Normal  posture. Extremities:  Without edema. Neurologic:  Alert and  oriented x4;  grossly normal neurologically. Skin:  Intact without significant lesions or rashes. Psych:  Alert and cooperative. Normal mood and affect.   Assessment   Misty Mccullough is a 52 y.o. female presenting today in follow-up with a history of    PLAN    ELF Ttest, AFP, INR   Gelene Mink, PhD, ANP-BC [facility]

## 2023-04-30 LAB — AFP TUMOR MARKER: AFP, Serum, Tumor Marker: <1.8 ng/mL (ref 0.0–9.2)

## 2023-05-01 ENCOUNTER — Telehealth: Payer: Self-pay | Admitting: *Deleted

## 2023-05-01 LAB — CERULOPLASMIN: Ceruloplasmin: 25.8 mg/dL (ref 19.0–39.0)

## 2023-05-01 LAB — IGG, IGA, IGM
IgA: 138 mg/dL (ref 87–352)
IgG (Immunoglobin G), Serum: 491 mg/dL — ABNORMAL LOW (ref 586–1602)
IgM (Immunoglobulin M), Srm: 276 mg/dL — ABNORMAL HIGH (ref 26–217)

## 2023-05-01 LAB — ANA: Anti Nuclear Antibody (ANA): NEGATIVE

## 2023-05-01 LAB — MITOCHONDRIAL ANTIBODIES: Mitochondrial M2 Ab, IgG: <20 Units (ref 0.0–20.0)

## 2023-05-01 LAB — ANTI-SMOOTH MUSCLE ANTIBODY, IGG: F-Actin IgG: 2 Units (ref 0–19)

## 2023-05-01 NOTE — Telephone Encounter (Signed)
[employer] PA for CTA: The member is not in scope for prior-authorization/notification for the services requested

## 2023-05-02 ENCOUNTER — Ambulatory Visit (HOSPITAL_COMMUNITY)
Admission: RE | Admit: 2023-05-02 | Discharge: 2023-05-02 | Disposition: A | Discharge status: Home / Self Care | Payer: Medicaid Other | Source: Ambulatory Visit | Attending: Gastroenterology | Admitting: Gastroenterology

## 2023-05-02 ENCOUNTER — Encounter (HOSPITAL_COMMUNITY): Payer: Self-pay

## 2023-05-02 DIAGNOSIS — R634 Abnormal weight loss: Secondary | ICD-10-CM | POA: Diagnosis not present

## 2023-05-02 DIAGNOSIS — I70202 Unspecified atherosclerosis of native arteries of extremities, left leg: Secondary | ICD-10-CM | POA: Diagnosis not present

## 2023-05-02 DIAGNOSIS — I701 Atherosclerosis of renal artery: Secondary | ICD-10-CM | POA: Diagnosis not present

## 2023-05-02 DIAGNOSIS — N2889 Other specified disorders of kidney and ureter: Secondary | ICD-10-CM | POA: Diagnosis not present

## 2023-05-02 DIAGNOSIS — Q272 Other congenital malformations of renal artery: Secondary | ICD-10-CM | POA: Diagnosis not present

## 2023-05-02 LAB — POCT I-STAT CREATININE: Creatinine, Ser: 0.7 mg/dL (ref 0.44–1.00)

## 2023-05-02 MED ORDER — IOHEXOL 350 MG/ML SOLN
100.0000 mL | Freq: Once | INTRAVENOUS | Status: AC | PRN
  Administered 2023-05-02: 100 mL via INTRAVENOUS

## 2023-05-05 ENCOUNTER — Encounter: Payer: Self-pay | Admitting: Obstetrics and Gynecology

## 2023-05-05 ENCOUNTER — Other Ambulatory Visit: Payer: Medicaid Other | Admitting: Obstetrics and Gynecology

## 2023-05-05 NOTE — Patient Outreach (Signed)
Care Coordination  05/05/2023  Misty Mccullough 02/18/1971 [id number]  RNCM called patient at scheduled time, patient answered phone and stated she was sick-appointment rescheduled at patient request.  Kathi Der RN, BSN [place]  [facility] Care Management Coordinator - Managed [place] High Risk [phone]

## 2023-05-06 ENCOUNTER — Ambulatory Visit: Payer: Self-pay | Admitting: Nurse Practitioner

## 2023-05-08 ENCOUNTER — Other Ambulatory Visit: Payer: Medicaid Other | Admitting: Pharmacist

## 2023-05-08 DIAGNOSIS — J42 Unspecified chronic bronchitis: Secondary | ICD-10-CM | POA: Diagnosis not present

## 2023-05-08 MED ORDER — OZEMPIC (0.25 OR 0.5 MG/DOSE) 2 MG/3ML ~~LOC~~ SOPN: 0.5000 mg | PEN_INJECTOR | SUBCUTANEOUS | 1 refills | Status: DC

## 2023-05-08 MED ORDER — FREESTYLE LIBRE 3 SENSOR MISC: 3 refills | Status: DC

## 2023-05-08 NOTE — Progress Notes (Signed)
I have reviewed the pharmacist's encounter and agree with their documentation. Collaborated with PCP to place refills on Ozempic. Systemwide standing order in place for ordering of CGM refills.   Catie Eppie Gibson, PharmD, BCACP, CPP [facility] [phone]

## 2023-05-08 NOTE — Progress Notes (Signed)
05/08/2023 Name: Misty Mccullough MRN: [id number] DOB: 09-16-1971  Chief Complaint  Patient presents with   Diabetes   Hypertension    Sherley Matto is a 52 y.o. year old female who presented for a telephone visit.   They were referred to the pharmacist by their PCP for assistance in managing diabetes, hypertension, and hyperlipidemia. PMH of HTN, CVA, HFpEF, PAD, CAD, COPD, early cirrhosis, tobacco dependence.  Subjective:  Care Team: Primary Care Provider: Ivonne Andrew, NP ; Next Scheduled Visit: 06/20/23  Medication Access/Adherence  Current Pharmacy:  [facility] [address], [place] - [address] [place] [address] Phone: [phone] Fax: [phone]  [facility] [address], [place] - [address] [place] [address] Phone: [phone] Fax: [phone]   Patient reports affordability concerns with their medications: No . Medicaid. Patient reports access/transportation concerns to their pharmacy: No  Patient reports adherence concerns with their medications:  No     Diabetes:  Current medications: Ozempic 0.5 mg weekly (ran out last Wednesday 7/24), Lantus 18 units daily, Jardiance 10 mg daily, metformin XR 1000 mg daily  Medications tried in the past: glipizide  Current glucose readings: none, ran out of [employer] sensors. "Finished" Ozempic (semaglutide) - was not aware that this was a long term medication. Pt reports 20 lbs weight loss since May (prior to starting Ozempic).   Last available download 6/13 - 6/26 (NOT CURRENT): % Time CGM is active: 97% Average Glucose: 166 mg/dL Glucose Management Indicator: 7.3  Glucose Variability: 22.8 (goal <36%) Time in Goal:  - Time in range 70-180: 94% - Time above range: 24% - Time below range: 0% Observed patterns: low variability, consistent throughout day  Patient denies hypoglycemic s/sx including dizziness, shakiness, sweating.  Patient reports hyperglycemic  symptoms including neuropathy (chronic issue). Denies polyuria, polydipsia, polyphagia, nocturia, blurred vision.  Current meal patterns: Eating ~2-3 meals/day. Works at [employer]. - Breakfast: biscuit/gravy - Supper: quick food - Drinks: 2-3 glasses of water per day, diet mountain dew.  Current physical activity: sedentary lifestyle  Current medication access support: none currently. Has medicaid.  Hyperlipidemia/ASCVD Risk Reduction  Current lipid lowering medications: atorva 80 mg daily, ezetimibe 10 mg daily  Antiplatelet regimen: aspirin 81 mg daily  Antianginal agents: ranolazine 500 mg BID  ASCVD History: PAD, CVA, CAD (diffuse stenosis on cath, no stent) Family History: MI in father (age 78) Risk Factors: tobacco use, T2DM, HLD  Heart Failure:  Taking furosemide as needed. Has been able to keep fluid weight off recently. Stopped taking daily due to lightheadedness.   Current medications:  ACEi/ARB/ARNI: lisinopril 40 mg daily SGLT2i: Jardiance 10 mg daily Beta blocker: carvedilol 25 mg twice daily Mineralocorticoid Receptor Antagonist: n/a Diuretic regimen: furosemide 60 mg daily  Patient does not have a validated, automated, upper arm home BP cuff Current blood pressure readings readings: SBP 120-130.  Patient denies hypotensive s/sx including dizziness, lightheadedness.  Patient denies hypertensive symptoms including headache, chest pain, shortness of breath  Objective:  Lab Results  Component Value Date   HGBA1C 10.2 (A) 03/19/2023    Lab Results  Component Value Date   CREATININE 0.70 05/02/2023   BUN 28 (H) 03/04/2023   NA 138 03/04/2023   K 4.0 03/04/2023   CL 99 03/04/2023   CO2 24 03/04/2023    Lab Results  Component Value Date   CHOL 121 03/04/2023   HDL 37 (L) 03/04/2023   LDLCALC 67  03/04/2023   TRIG 86 03/04/2023   CHOLHDL 3.3 03/04/2023    Medications Reviewed Today     Reviewed by Particia Lather, RPH (Pharmacist) on 05/08/23 at  1524  Med List Status: <None>   Medication Order Taking? Sig Documenting Provider Last Dose Status Informant  albuterol (PROVENTIL) (2.5 MG/3ML) 0.083% nebulizer solution [id number]  Take 3 mLs (2.5 mg total) by nebulization every 4 (four) hours as needed for wheezing or shortness of breath. Ivonne Andrew, NP  Active Self  albuterol (VENTOLIN HFA) 108 (90 Base) MCG/ACT inhaler [id number]  Inhale 2 puffs into the lungs every 6 (six) hours as needed for wheezing or shortness of breath. Ivonne Andrew, NP  Active Self  aspirin EC 81 MG tablet [id number] Yes Take 1 tablet (81 mg total) by mouth daily with breakfast. Swallow whole. Shon Hale, MD Taking Active Self  atorvastatin (LIPITOR) 80 MG tablet [id number] Yes Take 1 tablet (80 mg total) by mouth daily. Mallipeddi, Vishnu P, MD Taking Active   blood glucose meter kit and supplies KIT [id number]  Dispense based on patient and insurance preference. Use up to four times daily as directed. Orion Crook I, NP  Active Self  carvedilol (COREG) 25 MG tablet [id number] Yes Take 1 tablet (25 mg total) by mouth 2 (two) times daily with a meal. Mallipeddi, Vishnu P, MD Taking Active   Continuous Glucose Sensor (FREESTYLE LIBRE 3 SENSOR) MISC [id number] No Place 1 sensor on the skin every 14 days. Use to check glucose continuously  Patient not taking: Reported on 05/08/2023   Ivonne Andrew, NP Not Taking Active            Med Note Particia Lather   Thu May 08, 2023  3:23 PM) Ran out  cyanocobalamin (VITAMIN B12) 1000 MCG/ML injection [id number] Yes Inject 1 mL (1,000 mcg total) into the skin once a week. 1ml injection weeklyX4 then once a month Malachy Mood, MD Taking Active   dicyclomine (BENTYL) 10 MG capsule [id number] Yes Take 1 capsule (10 mg total) by mouth 4 (four) times daily -  before meals and at bedtime. Gelene Mink, NP Taking Active Self  empagliflozin (JARDIANCE) 10 MG TABS tablet [id number] Yes Take 1 tablet (10 mg total) by mouth daily before  breakfast. Ivonne Andrew, NP Taking Active Self           Med Note Clearance Coots, Alanda Slim Feb 20, 2023  8:58 AM)    ezetimibe (ZETIA) 10 MG tablet [id number] Yes Take 1 tablet (10 mg total) by mouth daily. Mallipeddi, Vishnu P, MD Taking Active   fluticasone-salmeterol (ADVAIR) 100-50 MCG/ACT AEPB [id number] Yes Inhale 1 puff into the lungs 2 (two) times daily. Ivonne Andrew, NP Taking Active   folic acid (FOLVITE) 800 MCG tablet [id number] Yes Take 1 tablet by mouth once daily Ivonne Andrew, NP Taking Active   furosemide (LASIX) 40 MG tablet [id number] Yes Take 1 tablet (40 mg total) by mouth daily. Mallipeddi, Orion Modest, MD Taking Active            Med Note Particia Lather   Thu May 08, 2023  2:11 PM) Taking PRN - last took last week  ibuprofen (ADVIL) 200 MG tablet [id number] No Take 800 mg by mouth every 8 (eight) hours as needed (pain.).  Patient not taking: Reported on 05/08/2023   [provider] Not Taking Active Self  insulin glargine (LANTUS SOLOSTAR) 100 UNIT/ML Solostar Pen [id number] Yes  Inject 16 Units into the skin daily. Ivonne Andrew, NP Taking Active   lisinopril (ZESTRIL) 40 MG tablet [id number] Yes Take 1 tablet (40 mg total) by mouth daily. Ivonne Andrew, NP Taking Active Self  metFORMIN (GLUCOPHAGE-XR) 500 MG 24 hr tablet [id number] Yes Take 1 tablet (500 mg total) by mouth 2 (two) times daily with a meal. Corky Crafts, MD Taking Active   Nebulizers (COMPRESSOR/NEBULIZER) MISC [id number]  1 Units by Does not apply route as needed. Shon Hale, MD  Active Self  nicotine (NICODERM CQ - DOSED IN MG/24 HOURS) 21 mg/24hr patch [id number]  Place 1 patch (21 mg total) onto the skin daily. Coralyn Helling, MD  Active   nitroGLYCERIN (NITROSTAT) 0.4 MG SL tablet [id number]  Place 1 tablet (0.4 mg total) under the tongue every 5 (five) minutes x 3 doses as needed for chest pain (If no relief after 3rd dose proceed to ED or call 911). Mallipeddi, Vishnu P, MD  Active    omeprazole (PRILOSEC) 20 MG capsule [id number] Yes Take 1 capsule (20 mg total) by mouth daily. Ivonne Andrew, NP Taking Active Self  PAIN RELIEVING LIDOCAINE EX [id number]  Apply 1 Application topically daily. [provider]  Active Self  ranolazine (RANEXA) 500 MG 12 hr tablet [id number] Yes Take 1 tablet (500 mg total) by mouth 2 (two) times daily. Mallipeddi, Vishnu P, MD Taking Active   Semaglutide,0.25 or 0.5MG /DOS, (OZEMPIC, 0.25 OR 0.5 MG/DOSE,) 2 MG/3ML SOPN [id number] Yes Inject 0.5 mg into the skin once a week. Ivonne Andrew, NP Taking Active            Med Note Particia Lather   Thu May 08, 2023  2:14 PM) Taking, but ran out and thought course was completed.  SV IRON 325 (65 Fe) MG tablet [id number] Yes Take 1 tablet by mouth once daily Ivonne Andrew, NP Taking Active Self           Med Note Farris Has Feb 17, 2023 10:31 AM) On hold due to upcoming procedure.  Syringe, Disposable, (2-3CC SYRINGE) 3 ML MISC [id number]  1 mL by Does not apply route once a week for 10 doses. Malachy Mood, MD  Active   Tiotropium Bromide Monohydrate (SPIRIVA RESPIMAT) 2.5 MCG/ACT AERS [id number] Yes Inhale 2 puffs into the lungs daily. Ivonne Andrew, NP Taking Active              Assessment/Plan:   Diabetes: - Currently uncontrolled with A1c of 10.2% (03/19/23) above goal <7%, however much improved per most recent (6/13 > 6/26) CGM report. Unfortunately, there is no more recent data to assess for further improvement. Will coordinate with PCP to send refill of Ozempic (semaglutide), and encouraged pt to pick up medication this week and restart 0.5 mg once weekly. Pt is an excellent candidate for continued therapy with GLP-1RA with metabolic syndrome, heart failure, and recent diagnosis of early cirrhosis. Will also send refill for new Freestyle Libre 3 sensors. - Reviewed long term cardiovascular and renal outcomes of uncontrolled blood sugar - Reviewed goal A1c, goal fasting,  and goal 2 hour post prandial glucose - Reviewed dietary modifications including increasing water intake. - Reviewed lifestyle modifications including increasing physical activity. - Recommend to restart Ozempic (semaglutide) 0.5 mg daily  - Sent refills for [employer] 3 sensors.    ASCVD Risk Reduction: - Currently uncontrolled with LDL 67 mg/dL slightly above goal <06 mg/dL given ASCVD and extensive  risk factors. Did not discuss tobacco cessation today, but per recent cardiology note (04/14/23) pt cut down to smoking 1/2 ppd from 2 ppd. Will prioritize discussion of tobacco cessation for ASCVD risk reduction at follow-up appt.  - Recommend to continue atorvastatin 80 mg daily and ezetimibe 10 mg daily   Hypertension: - Currently uncontrolled per in-office blood pressures, however pt reports home SBP ranging 120-130s. Unsure of cuff brand/type. Did not discuss today, but patient would likely benefit from spironolactone for improved BP control and additional benefit in managing HFpEF. No history of hypokalemia. - Recommended to check home blood pressure and heart rate and record BP readings for future appt. - Recommend to continue lisinopril 40 mg daily, carvedilol 25 mg BID, ranolazine 500 mg daily    Heart Failure: - Currently appropriately managed, but patient could benefit from addition of spironolactone for benefit in HFpEF, blood pressure control, and maintenance of diuresis. Will consider discussion at future visit given in-office blood pressures uncontrolled above goal <130/80. However, home SBP ranging 120-130s. No history of hyperkalemia. - Reviewed appropriate blood pressure monitoring technique and reviewed goal blood pressure - Reviewed to weigh daily and when to contact cardiology with weight gain - Recommend to continue lisinopril 40 mg daily, Jardiance (empagliflozin) 10 mg daily, carvedilol 25 mg twice daily, furosemide 60 mg PRN   Follow Up Plan: Pharmacist call 06/17/23,  PCP 06/20/23  Misty Mccullough, PharmD PGY1 Pharmacy Resident

## 2023-05-21 ENCOUNTER — Other Ambulatory Visit: Payer: Medicaid Other | Admitting: Obstetrics and Gynecology

## 2023-05-21 NOTE — Patient Instructions (Signed)
Hi Ms. Bhardwaj, I hope you are doing okay, I am sorry I missed you this afternoon- as a part of your Medicaid benefit, you are eligible for care management and care coordination services at no cost or copay. I was unable to reach you by phone today but would be happy to help you with your health related needs. Please feel free to call me at [phone].  A member of the Managed Medicaid care management team will reach out to you again over the next 30 business days.   Kathi Der RN, BSN [place]  [employer] - Managed Medicaid High Risk [phone]

## 2023-05-21 NOTE — Patient Outreach (Signed)
  Medicaid Managed Care   Unsuccessful Attempt Note   05/21/2023 Name: Misty Mccullough MRN: [id number] DOB: [birth year]  Referred by: Ivonne Andrew, NP Reason for referral : High Risk Managed Medicaid (Unsuccessful telephone outreach)  An unsuccessful telephone outreach was attempted today. The patient was referred to the case management team for assistance with care management and care coordination.    Follow Up Plan: The Managed Medicaid care management team will reach out to the patient again over the next 30 business  days. and The  Patient has been provided with contact information for the Managed Medicaid care management team and has been advised to call with any health related questions or concerns.   Kathi Der RN, BSN [place]  [employer] - Managed Medicaid High Risk [phone]

## 2023-05-27 ENCOUNTER — Other Ambulatory Visit: Payer: Medicaid Other | Admitting: Obstetrics and Gynecology

## 2023-05-27 NOTE — Patient Outreach (Signed)
  Medicaid Managed Care   Unsuccessful Attempt Note   05/27/2023 Name: Misty Mancil MRN: [id number] DOB: June 30, 1971  Referred by: Ivonne Andrew, NP Reason for referral : High Risk Managed Medicaid (Unsuccessful telephone outreach)  A second unsuccessful telephone outreach was attempted today. The patient was referred to the case management team for assistance with care management and care coordination.    Follow Up Plan: The Managed Medicaid care management team will reach out to the patient again over the next 30 business  days. and The  Patient has been provided with contact information for the Managed Medicaid care management team and has been advised to call with any health related questions or concerns.   Kathi Der RN, BSN [place]  [employer] - Managed Medicaid High Risk [phone]

## 2023-05-27 NOTE — Patient Instructions (Signed)
Hi Misty Mccullough am sorry we keep missing you-I hope things are okay- as a part of your Medicaid benefit, you are eligible for care management and care coordination services at no cost or copay. I was unable to reach you by phone today but would be happy to help you with your health related needs. Please feel free to call me at [phone]  A member of the Managed Medicaid care management team will reach out to you again over the next 30 business days.   Kathi Der RN, BSN [place]  [employer] - Managed Medicaid High Risk [phone]

## 2023-06-05 ENCOUNTER — Encounter: Payer: Self-pay | Admitting: Obstetrics and Gynecology

## 2023-06-05 ENCOUNTER — Other Ambulatory Visit: Payer: Medicaid Other | Admitting: Obstetrics and Gynecology

## 2023-06-05 NOTE — Patient Instructions (Signed)
Hi Misty Mccullough, thank you for the updates, have a nice day!  Misty Mccullough was given information about Medicaid Managed Care team care coordination services as a part of their [facility] Medicaid benefit. Misty Mccullough verbally consented to engagement with the [facility] Managed Care team.   If you are experiencing a medical emergency, please call 911 or report to your local emergency department or urgent care.   If you have a non-emergency medical problem during routine business hours, please contact your provider's office and ask to speak with a nurse.   For questions related to your [facility], please call: [phone] or visit the homepage here: [email or web address]  If you would like to schedule transportation through your [facility], please call the following number at least 2 days in advance of your appointment: [phone]   Rides for urgent appointments can also be made after hours by calling Member Services.  Call the Behavioral Health Crisis Line at [phone], at any time, 24 hours a day, 7 days a week. If you are in danger or need immediate medical attention call 911.  If you would like help to quit smoking, call 1-800-QUIT-NOW (726-553-2079) OR Espaol: 1-855-Djelo-Ya (7-253-664-4034) o para ms informacin haga clic aqu or Text READY to 742-595 to register via text  Misty Mccullough - following are the goals we discussed in your visit today:   Goals Addressed    Timeframe:  Long-Range Goal Priority:  High Start Date:  02/28/23                           Expected End Date: ongoing                      Follow Up Date: 07/23/23   - schedule appointment for vaccines needed due to my age or health - schedule recommended health tests (blood work, mammogram, colonoscopy, pap test) - schedule and keep appointment for annual check-up    Why is this important?    Screening tests can find diseases early when they are easier to treat.  Your doctor or nurse will talk with you about which tests are important for you.  Getting shots for common diseases like the flu and shingles will help prevent them.   06/05/23:  patient up to date on all appts.  Patient verbalizes understanding of instructions and care plan provided today and agrees to view in MyChart. Active MyChart status and patient understanding of how to access instructions and care plan via MyChart confirmed with patient.     The Managed Medicaid care management team will reach out to the patient again over the next 60 business  days.  The  Patient  has been provided with contact information for the Managed Medicaid care management team and has been advised to call with any health related questions or concerns.   Kathi Der RN, BSN [place]  [facility] Care Management Coordinator - Managed Medicaid High Risk [phone]   Following is a copy of your plan of care:  Care Plan : RN Care Manager Plan of Care  Updates made by Danie Chandler, RN since 06/05/2023 12:00 AM     Problem: Health Promotion or Disease Self-Management (General Plan of Care)      Long-Range Goal: Chronic Disease Management   Start Date: 02/28/2023  Expected End Date: 05/31/2023  Priority: High  Note:   Current Barriers:  Knowledge Deficits related to plan of care for management of HTN, h/o CVA, CHF, COPD, DM2, HLD, anemia, tobacco use, anxiety,  Chronic Disease Management support and education needs related to HTN, h/o CVA, CHF, COPD, DM2, HLD, anemia, tobacco use, anxiety,  Financial Constraints  06/05/23:  Smokes 1/2 ppd-using patches -occasional chest pain with no change.  Swelling in feet only.  PFTs performed.  Takes Lasix prn.  Does not check BP.  [id number].  Has submitted paperwork for SSI.  Up to date on all appts.  RNCM Clinical Goal(s):  Patient will verbalize understanding of plan for  management of HTN, h/o CVA, CHF, COPD, DM2, HLD, anemia, tobacco use, anxiety,  as evidenced by patient report verbalize basic understanding of  HTN, h/o CVA, CHF, COPD, DM2, HLD, anemia, tobacco use, anxiety,  disease process and self health management plan as evidenced by patient report take all medications exactly as prescribed and will call provider for medication related questions as evidenced by patient report demonstrate understanding of rationale for each prescribed medication as evidenced by patient report attend all scheduled medical appointments as evidenced by patient report and EMR review. demonstrate ongoing adherence to prescribed treatment plan for HTN, h/o CVA, CHF, COPD, DM2, HLD, anemia, tobacco use, anxiety,  as evidenced by patient report and EMR review. continue to work with RN Care Manager to address care management and care coordination needs related to  HTN, h/o CVA, CHF, COPD, DM2, HLD, anemia, tobacco use, anxiety,  as evidenced by adherence to CM Team Scheduled appointments work with Child psychotherapist to address needed food resources  related to the management of  HTN, h/o CVA, CHF, COPD, DM2, HLD, anemia, tobacco use, anxiety, as evidenced by review of EMR and patient or Child psychotherapist report through collaboration with Medical illustrator, provider, and care team.   Interventions: Inter-disciplinary care team collaboration (see longitudinal plan of care) Evaluation of current treatment plan related to  self management and patient's adherence to plan as established by provider  Heart Failure Interventions:  (Status:  New goal.) Long Term Goal Discussed the importance of keeping all appointments with provider Assessed social determinant of health barriers   COPD Interventions:  (Status:  New goal.) Long Term Goal Advised patient to track and manage COPD triggers Provided instruction about proper use of medications used for management of COPD including inhalers Advised patient to  engage in light exercise as tolerated 3-5 days a week to aid in the the management of COPD Discussed the importance of adequate rest and management of fatigue with COPD Assessed social determinant of health barriers  Diabetes Interventions:  (Status:  New goal.) Long Term Goal Assessed patient's understanding of A1c goal: <7% Reviewed medications with patient and discussed importance of medication adherence Counseled on importance of regular laboratory monitoring as prescribed Discussed plans with patient for ongoing care management follow up and provided patient with direct contact information for care management team Reviewed scheduled/upcoming provider appointments  Advised patient, providing education and rationale, to check cbg as directed and record, calling provider  for findings outside established parameters Review of patient status, including review of consultants reports, relevant laboratory and other test results, and medications completed Assessed social determinant of health barriers Lab Results  Component Value Date   HGBA1C 7.9 (A) 12/18/2022       HGBA1C  10.2                                  03/19/2023  Hyperlipidemia Interventions:  (Status:  New goal.) Long Term Goal Medication review performed; medication list updated in electronic medical record.  Provider established cholesterol goals reviewed Counseled on importance of regular laboratory monitoring as prescribed Reviewed importance of limiting foods high in cholesterol Assessed social determinant of health barriers   Hypertension Interventions:  (Status:  New goal.) Long Term Goal Last practice recorded BP readings:  BP Readings from Last 3 Encounters:     02/20/23 (!) 180/72  02/19/23 (!) 133/52  03/19/23         134/59  Most recent eGFR/CrCl:  Lab Results  Component Value Date   EGFR 103 12/25/2022    No components found for: "CRCL"  Evaluation of  current treatment plan related to hypertension self management and patient's adherence to plan as established by provider Reviewed medications with patient and discussed importance of compliance Discussed plans with patient for ongoing care management follow up and provided patient with direct contact information for care management team Advised patient, providing education and rationale, to monitor blood pressure daily and record, calling PCP for findings outside established parameters Reviewed scheduled/upcoming provider appointments including:  Assessed social determinant of health barriers  Smoking Cessation Interventions:  (Status:  New goal.) Long Term Goal Reviewed smoking history:  ; currently smoking 1/2  ppd-decreased from 2 packs a day-using patches  Evaluation of current treatment plan reviewed Advised patient to discuss smoking cessation options with provider Reviewed scheduled/upcoming provider appointments  Provided contact information for [place] Quit Line (1-800-QUIT-NOW) Discussed plans with patient for ongoing care management follow up and provided patient with direct contact information for care management team Assessed social determinant of health barriers  Patient Goals/Self-Care Activities: Take all medications as prescribed Attend all scheduled provider appointments Call pharmacy for medication refills 3-7 days in advance of running out of medications Perform all self care activities independently  Perform IADL's (shopping, preparing meals, housekeeping, managing finances) independently Call provider office for new concerns or questions  Work with the social worker to address care coordination needs and will continue to work with the clinical team to address health care and disease management related needs  Follow Up Plan:  The patient has been provided with contact information for the care management team and has been advised to call with any health related questions or  concerns.  The care management team will reach out to the patient again over the next 60 business day

## 2023-06-05 NOTE — Patient Outreach (Signed)
Medicaid Managed Care   Nurse Care Manager Note  06/05/2023 Name:  Misty Mccullough MRN:  [id number] DOB:  [birth year]  Misty Mccullough is an 52 y.o. year old female who is a primary patient of Ivonne Andrew, NP.  The [facility] Managed Care Coordination team was consulted for assistance with:    Chronic healthcare management needs, HTN, DM, h/o CVA, CHF, COPD, B12 deficiency, tobacco use, HLD, PAD, cirrhosis  Misty Mccullough was given information about Medicaid Managed Care Coordination team services today. Misty Mccullough Patient agreed to services and verbal consent obtained.  Engaged with patient by telephone for follow up visit in response to provider referral for case management and/or care coordination services.   Assessments/Interventions:  Review of past medical history, allergies, medications, health status, including review of consultants reports, laboratory and other test data, was performed as part of comprehensive evaluation and provision of chronic care management services.  SDOH (Social Determinants of Health) assessments and interventions performed: SDOH Interventions    Flowsheet Row Patient Outreach Telephone from 06/05/2023 in [place] [facility] Patient Outreach Telephone from 05/05/2023 in [place] POP[facility] DEPARTMENT Patient Outreach Telephone from 04/03/2023 in [place] POPULATION HEALTH DEPARTMENT Patient Outreach Telephone from 02/28/2023 in  POPULATION HEALTH DEPARTMENT Office Visit from 02/20/2023 in [facility] Office Visit from 12/18/2022 in [facility]  SDOH Interventions        Food Insecurity Interventions -- -- -- -- --  Misty Mccullough to RN CM] --  Housing Interventions -- Intervention Not Indicated -- -- -- --  Transportation Interventions Intervention Not Indicated -- -- -- -- --  Alcohol Usage Interventions -- -- -- Intervention Not Indicated (Score <7) -- --  Depression Interventions/Treatment  --  -- -- -- -- PHQ2-9 Score <4 Follow-up Not Indicated  Financial Strain Interventions -- -- -- -- --  [medication access review] --  Physical Activity Interventions -- -- -- Intervention Not Indicated -- --  Stress Interventions -- -- Intervention Not Indicated -- -- --  Health Literacy Interventions -- Intervention Not Indicated -- -- -- --     Care Plan No Known Allergies  Medications Reviewed Today     Reviewed by Danie Chandler, RN (Registered Nurse) on 06/05/23 at 1129  Med List Status: <None>   Medication Order Taking? Sig Documenting Provider Last Dose Status Informant  albuterol (PROVENTIL) (2.5 MG/3ML) 0.083% nebulizer solution [id number] No Take 3 mLs (2.5 mg total) by nebulization every 4 (four) hours as needed for wheezing or shortness of breath. Ivonne Andrew, NP Taking Active Self  albuterol (VENTOLIN HFA) 108 (90 Base) MCG/ACT inhaler [id number] No Inhale 2 puffs into the lungs every 6 (six) hours as needed for wheezing or shortness of breath. Ivonne Andrew, NP Taking Active Self  aspirin EC 81 MG tablet [id number] No Take 1 tablet (81 mg total) by mouth daily with breakfast. Swallow whole. Shon Hale, MD Taking Active Self  atorvastatin (LIPITOR) 80 MG tablet [id number] No Take 1 tablet (80 mg total) by mouth daily. Mallipeddi, Vishnu P, MD Taking Expired 05/21/23 2359   blood glucose meter kit and supplies KIT [id number] No Dispense based on patient and insurance preference. Use up to four times daily as directed. Orion Crook I, NP Taking Active Self  carvedilol (COREG) 25 MG tablet [id number] No Take 1 tablet (25 mg total) by mouth 2 (two) times daily with a meal. Mallipeddi, Vishnu P, MD Taking Active   Continuous Glucose Sensor (  FREESTYLE LIBRE 3 SENSOR) MISC [id number] No Place 1 sensor on the skin every 14 days. Use to check glucose continuously  Patient not taking: Reported on 05/08/2023   Ivonne Andrew, NP Not Taking Active            Med Note Particia Lather    Thu May 08, 2023  3:23 PM) Ran out  cyanocobalamin (VITAMIN B12) 1000 MCG/ML injection [id number] No Inject 1 mL (1,000 mcg total) into the skin once a week. 1ml injection weeklyX4 then once a month Malachy Mood, MD Taking Active   dicyclomine (BENTYL) 10 MG capsule [id number] No Take 1 capsule (10 mg total) by mouth 4 (four) times daily -  before meals and at bedtime. Gelene Mink, NP Taking Active Self  empagliflozin (JARDIANCE) 10 MG TABS tablet [id number] No Take 1 tablet (10 mg total) by mouth daily before breakfast. Ivonne Andrew, NP Taking Active Self           Med Note Clearance Coots, Alanda Slim Feb 20, 2023  8:58 AM)    ezetimibe (ZETIA) 10 MG tablet [id number] No Take 1 tablet (10 mg total) by mouth daily. Mallipeddi, Vishnu P, MD Taking Active   fluticasone-salmeterol (ADVAIR) 100-50 MCG/ACT AEPB [id number] No Inhale 1 puff into the lungs 2 (two) times daily. Ivonne Andrew, NP Taking Active   folic acid (FOLVITE) 800 MCG tablet [id number] No Take 1 tablet by mouth once daily Ivonne Andrew, NP Taking Active   furosemide (LASIX) 40 MG tablet [id number] No Take 1 tablet (40 mg total) by mouth daily. Mallipeddi, Orion Modest, MD Taking Active            Med Note Particia Lather   Thu May 08, 2023  2:11 PM) Taking PRN - last took last week  ibuprofen (ADVIL) 200 MG tablet [id number] No Take 800 mg by mouth every 8 (eight) hours as needed (pain.).  Patient not taking: Reported on 05/08/2023   [provider] Not Taking Active Self  insulin glargine (LANTUS SOLOSTAR) 100 UNIT/ML Solostar Pen [id number] No Inject 16 Units into the skin daily. Ivonne Andrew, NP Taking Active   lisinopril (ZESTRIL) 40 MG tablet [id number] No Take 1 tablet (40 mg total) by mouth daily. Ivonne Andrew, NP Taking Active Self  metFORMIN (GLUCOPHAGE-XR) 500 MG 24 hr tablet [id number] No Take 1 tablet (500 mg total) by mouth 2 (two) times daily with a meal. Corky Crafts, MD Taking Active   Nebulizers  (COMPRESSOR/NEBULIZER) MISC [id number] No 1 Units by Does not apply route as needed. Shon Hale, MD Taking Active Self  nicotine (NICODERM CQ - DOSED IN MG/24 HOURS) 21 mg/24hr patch [id number] No Place 1 patch (21 mg total) onto the skin daily. Coralyn Helling, MD Taking Active   nitroGLYCERIN (NITROSTAT) 0.4 MG SL tablet [id number] No Place 1 tablet (0.4 mg total) under the tongue every 5 (five) minutes x 3 doses as needed for chest pain (If no relief after 3rd dose proceed to ED or call 911). Mallipeddi, Vishnu P, MD Taking Active   omeprazole (PRILOSEC) 20 MG capsule [id number] No Take 1 capsule (20 mg total) by mouth daily. Ivonne Andrew, NP Taking Active Self  PAIN RELIEVING LIDOCAINE EX [id number] No Apply 1 Application topically daily. [provider] Taking Active Self  ranolazine (RANEXA) 500 MG 12 hr tablet [id number] No Take 1 tablet (500 mg total) by mouth 2 (two) times daily. Mallipeddi, Orion Modest, MD Taking  Active   Semaglutide,0.25 or 0.5MG /DOS, (OZEMPIC, 0.25 OR 0.5 MG/DOSE,) 2 MG/3ML SOPN [id number]  Inject 0.5 mg into the skin once a week. Ivonne Andrew, NP  Active   SV IRON 325 (65 Fe) MG tablet [id number] No Take 1 tablet by mouth once daily Ivonne Andrew, NP Taking Active Self           Med Note Farris Has Feb 17, 2023 10:31 AM) On hold due to upcoming procedure.  Tiotropium Bromide Monohydrate (SPIRIVA RESPIMAT) 2.5 MCG/ACT AERS [id number] No Inhale 2 puffs into the lungs daily. 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   Anemia due to vitamin B12 deficiency 03/10/2023   CAD (coronary artery disease) 03/04/2023   Mitral regurgitation 03/04/2023   Low ferritin 02/25/2023   Dysphagia 02/11/2023   PAD (peripheral artery disease) (HCC) 01/22/2023   Colon cancer screening 12/25/2022   Sepsis (HCC) 12/20/2022    CAP (community acquired pneumonia) 12/20/2022   COPD with acute exacerbation (HCC) 12/20/2022   AKI (acute kidney injury) (HCC) 12/20/2022   Acute respiratory failure with hypoxia (HCC) 12/19/2022   Chronic diastolic heart failure (HCC) 11/16/2022   Acute exacerbation of Diastolic CHF (congestive heart failure) (HCC) 11/14/2022   Cough 12/13/2021   Chronic bronchitis (HCC) 12/13/2021   Shortness of breath 08/25/2019   Anxiety 08/25/2019   Hyperglycemia 08/25/2019   Hemoglobin A1C between 7% and 9% indicating borderline diabetic control (HCC) 08/25/2019   Type 2 diabetes mellitus without complication, without long-term current use of insulin (HCC) 11/24/2018   Bilateral lower extremity edema 11/24/2018   Pain and swelling of toe, left 11/04/2018   Insomnia 11/04/2018   Tobacco dependence 02/05/2018   H/O: CVA (cerebrovascular accident) 02/05/2018   Tobacco use 12/16/2017   Acute CVA (cerebrovascular accident) (HCC) 12/15/2017   Malnutrition of moderate degree 08/14/2016   Cardiac chest pain 08/13/2016   Back pain 08/13/2016   Essential hypertension 08/13/2016   HLD (hyperlipidemia) 08/13/2016   Diabetes mellitus with complication (HCC) 08/13/2016   Chest pain, exertional    Conditions to be addressed/monitored per PCP order:  Chronic healthcare management needs, HTN, DM, h/o CVA, CHF, COPD, B12 deficiency, tobacco use, HLD, PAD, cirrhosis  Care Plan : RN Care Manager Plan of Care  Updates made by Danie Chandler, RN since 06/05/2023 12:00 AM     Problem: Health Promotion or Disease Self-Management (General Plan of Care)      Long-Range Goal: Chronic Disease Management   Start Date: 02/28/2023  Expected End Date: 05/31/2023  Priority: High  Note:   Current Barriers:  Knowledge Deficits related to plan of care for management of HTN, h/o CVA, CHF, COPD, DM2, HLD, anemia, tobacco use, anxiety,  Chronic Disease Management support and education needs related to HTN, h/o CVA, CHF,  COPD, DM2, HLD, anemia, tobacco use, anxiety,  Financial Constraints  06/05/23:  Smokes 1/2 ppd-using patches -occasional chest pain with no change.  Swelling in feet only.  PFTs performed.  Takes Lasix prn.  Does not check BP.  [id number].  Has submitted paperwork for SSI.  Up to date on all appts.  RNCM Clinical Goal(s):  Patient will verbalize understanding of plan for management of HTN, h/o CVA, CHF, COPD, DM2, HLD, anemia, tobacco use, anxiety,  as evidenced  by patient report verbalize basic understanding of  HTN, h/o CVA, CHF, COPD, DM2, HLD, anemia, tobacco use, anxiety,  disease process and self health management plan as evidenced by patient report take all medications exactly as prescribed and will call provider for medication related questions as evidenced by patient report demonstrate understanding of rationale for each prescribed medication as evidenced by patient report attend all scheduled medical appointments as evidenced by patient report and EMR review. demonstrate ongoing adherence to prescribed treatment plan for HTN, h/o CVA, CHF, COPD, DM2, HLD, anemia, tobacco use, anxiety,  as evidenced by patient report and EMR review. continue to work with RN Care Manager to address care management and care coordination needs related to  HTN, h/o CVA, CHF, COPD, DM2, HLD, anemia, tobacco use, anxiety,  as evidenced by adherence to CM Team Scheduled appointments work with Child psychotherapist to address needed food resources  related to the management of  HTN, h/o CVA, CHF, COPD, DM2, HLD, anemia, tobacco use, anxiety, as evidenced by review of EMR and patient or Child psychotherapist report through collaboration with Medical illustrator, provider, and care team.   Interventions: Inter-disciplinary care team collaboration (see longitudinal plan of care) Evaluation of current treatment plan related to  self management and patient's adherence to plan as established by provider  Heart Failure Interventions:   (Status:  New goal.) Long Term Goal Discussed the importance of keeping all appointments with provider Assessed social determinant of health barriers   COPD Interventions:  (Status:  New goal.) Long Term Goal Advised patient to track and manage COPD triggers Provided instruction about proper use of medications used for management of COPD including inhalers Advised patient to engage in light exercise as tolerated 3-5 days a week to aid in the the management of COPD Discussed the importance of adequate rest and management of fatigue with COPD Assessed social determinant of health barriers  Diabetes Interventions:  (Status:  New goal.) Long Term Goal Assessed patient's understanding of A1c goal: <7% Reviewed medications with patient and discussed importance of medication adherence Counseled on importance of regular laboratory monitoring as prescribed Discussed plans with patient for ongoing care management follow up and provided patient with direct contact information for care management team Reviewed scheduled/upcoming provider appointments  Advised patient, providing education and rationale, to check cbg as directed and record, calling provider  for findings outside established parameters Review of patient status, including review of consultants reports, relevant laboratory and other test results, and medications completed Assessed social determinant of health barriers Lab Results  Component Value Date   HGBA1C 7.9 (A) 12/18/2022       HGBA1C                                                      10.2                                  03/19/2023  Hyperlipidemia Interventions:  (Status:  New goal.) Long Term Goal Medication review performed; medication list updated in electronic medical record.  Provider established cholesterol goals reviewed Counseled on importance of regular laboratory monitoring as prescribed Reviewed importance of limiting foods high in cholesterol Assessed social  determinant of health barriers   Hypertension Interventions:  (Status:  New goal.)  Long Term Goal Last practice recorded BP readings:  BP Readings from Last 3 Encounters:     02/20/23 (!) 180/72  02/19/23 (!) 133/52  03/19/23         134/59  Most recent eGFR/CrCl:  Lab Results  Component Value Date   EGFR 103 12/25/2022    No components found for: "CRCL"  Evaluation of current treatment plan related to hypertension self management and patient's adherence to plan as established by provider Reviewed medications with patient and discussed importance of compliance Discussed plans with patient for ongoing care management follow up and provided patient with direct contact information for care management team Advised patient, providing education and rationale, to monitor blood pressure daily and record, calling PCP for findings outside established parameters Reviewed scheduled/upcoming provider appointments including:  Assessed social determinant of health barriers  Smoking Cessation Interventions:  (Status:  New goal.) Long Term Goal Reviewed smoking history:  ; currently smoking 1/2  ppd-decreased from 2 packs a day-using patches  Evaluation of current treatment plan reviewed Advised patient to discuss smoking cessation options with provider Reviewed scheduled/upcoming provider appointments  Provided contact information for [place] [facility] ([email or web address]) Discussed plans with patient for ongoing care management follow up and provided patient with direct contact information for care management team Assessed social determinant of health barriers  Patient Goals/Self-Care Activities: Take all medications as prescribed Attend all scheduled provider appointments Call pharmacy for medication refills 3-7 days in advance of running out of medications Perform all self care activities independently  Perform IADL's (shopping, preparing meals, housekeeping, managing finances) independently Call  provider office for new concerns or questions  Work with the social worker to address care coordination needs and will continue to work with the clinical team to address health care and disease management related needs  Follow Up Plan:  The patient has been provided with contact information for the care management team and has been advised to call with any health related questions or concerns.  The care management team will reach out to the patient again over the next 60 business  days.   Long-Range Goal: Establish Plan of Care for Chronic Disease Management Needs   Priority: High  Note:   Timeframe:  Long-Range Goal Priority:  High Start Date:  02/28/23                           Expected End Date: ongoing                      Follow Up Date: 07/23/23   - schedule appointment for vaccines needed due to my age or health - schedule recommended health tests (blood work, mammogram, colonoscopy, pap test) - schedule and keep appointment for annual check-up    Why is this important?   Screening tests can find diseases early when they are easier to treat.  Your doctor or nurse will talk with you about which tests are important for you.  Getting shots for common diseases like the flu and shingles will help prevent them.   06/05/23:  patient up to date on all appts.   Follow Up:  Patient agrees to Care Plan and Follow-up.  Plan: The Managed Medicaid care management team will reach out to the patient again over the next 30 business days. and The  Patient has been provided with contact information for the Managed Medicaid care management team and has been advised to call with any  health related questions or concerns.  Date/time of next scheduled RN care management/care coordination outreach: 07/23/23 at 230

## 2023-06-08 DIAGNOSIS — J42 Unspecified chronic bronchitis: Secondary | ICD-10-CM | POA: Diagnosis not present

## 2023-06-12 ENCOUNTER — Inpatient Hospital Stay: Payer: Medicaid Other | Attending: Nurse Practitioner

## 2023-06-12 DIAGNOSIS — D519 Vitamin B12 deficiency anemia, unspecified: Secondary | ICD-10-CM | POA: Diagnosis present

## 2023-06-12 DIAGNOSIS — Z1211 Encounter for screening for malignant neoplasm of colon: Secondary | ICD-10-CM

## 2023-06-12 LAB — CBC WITH DIFFERENTIAL/PLATELET
Abs Immature Granulocytes: 0.01 10*3/uL (ref 0.00–0.07)
Basophils Absolute: 0.1 10*3/uL (ref 0.0–0.1)
Basophils Relative: 1 %
Eosinophils Absolute: 0.1 10*3/uL (ref 0.0–0.5)
Eosinophils Relative: 2 %
HCT: 36.7 % (ref 36.0–46.0)
Hemoglobin: 11.9 g/dL — ABNORMAL LOW (ref 12.0–15.0)
Immature Granulocytes: 0 %
Lymphocytes Relative: 30 %
Lymphs Abs: 1.4 10*3/uL (ref 0.7–4.0)
MCH: 30.1 pg (ref 26.0–34.0)
MCHC: 32.4 g/dL (ref 30.0–36.0)
MCV: 92.7 fL (ref 80.0–100.0)
Monocytes Absolute: 0.3 10*3/uL (ref 0.1–1.0)
Monocytes Relative: 7 %
Neutro Abs: 2.8 10*3/uL (ref 1.7–7.7)
Neutrophils Relative %: 60 %
Platelets: 169 10*3/uL (ref 150–400)
RBC: 3.96 MIL/uL (ref 3.87–5.11)
RDW: 16.5 % — ABNORMAL HIGH (ref 11.5–15.5)
WBC: 4.7 10*3/uL (ref 4.0–10.5)
nRBC: 0 % (ref 0.0–0.2)

## 2023-06-12 LAB — CBC WITH DIFFERENTIAL (CANCER CENTER ONLY)

## 2023-06-12 LAB — VITAMIN B12: Vitamin B-12: 273 pg/mL (ref 180–914)

## 2023-06-17 ENCOUNTER — Other Ambulatory Visit: Payer: Medicaid Other | Admitting: Pharmacist

## 2023-06-17 MED ORDER — FLUTICASONE-SALMETEROL 100-50 MCG/ACT IN AEPB: 1.0000 | INHALATION_SPRAY | Freq: Two times a day (BID) | RESPIRATORY_TRACT | 1 refills | Status: DC

## 2023-06-17 MED ORDER — ATORVASTATIN CALCIUM 80 MG PO TABS: 80.0000 mg | ORAL_TABLET | Freq: Every day | ORAL | 3 refills | Status: AC

## 2023-06-17 NOTE — Patient Instructions (Addendum)
Conni,   Let's try holding the metformin for the next week to see if there is any change in your diarrhea.   Try to eat protein with every meal, and low-fat when possible - baked/grilled chicken, fish, [place], and low fat beef. Higher fat things can cause more stomach upset with Ozempic.   We will order a blood pressure cuff through [facility] - they will call you to confirm your insurance.   Check your blood pressure twice weekly, and any time you have concerning symptoms like headache, chest pain, dizziness, shortness of breath, or vision changes.   Our goal is less than 130/80.  To appropriately check your blood pressure, make sure you do the following:  1) Avoid caffeine, exercise, or tobacco products for 30 minutes before checking. Empty your bladder. 2) Sit with your back supported in a flat-backed chair. Rest your arm on something flat (arm of the chair, table, etc). 3) Sit still with your feet flat on the floor, resting, for at least 5 minutes.  4) Check your blood pressure. Take 1-2 readings.  5) Write down these readings and bring with you to any provider appointments.  Bring your home blood pressure machine with you to a provider's office for accuracy comparison at least once a year.   Make sure you take your blood pressure medications before you come to any office visit, even if you were asked to fast for labs.   Take care!  Catie Eppie Gibson, PharmD, BCACP, CPP Clinical Pharmacist [facility] [phone]

## 2023-06-17 NOTE — Progress Notes (Signed)
06/17/2023 Name: Misty Mccullough MRN: [id number] DOB: [birth year]  Chief Complaint  Patient presents with   Medication Management   Diabetes   Hypertension   Hyperlipidemia    Misty Mccullough is a 52 y.o. year old female who presented for a telephone visit.   They were referred to the pharmacist by their PCP for assistance in managing diabetes, hypertension, and hyperlipidemia.    Subjective:  Care Team: Primary Care Provider: Ivonne Andrew, NP ; Next Scheduled Visit: 06/20/23  Medication Access/Adherence  Current Pharmacy:  [facility] [address], [place] - [address] [place] [address] Phone: [phone] Fax: [phone]  [facility] [address] [place] - [address] [place] [address] Phone: [phone] Fax: [phone]   Patient reports affordability concerns with their medications: No  Patient reports access/transportation concerns to their pharmacy: No  Patient reports adherence concerns with their medications:  No     Diabetes:  Current medications: Ozempic 0.5 mg weekly, metformin XR 500 mg twice daily, Lantus 18 units daily, Jardiance 10 mg daily   Continues to report diarrhea. Does not feel like changing from IR to XR or reducing metformin has impacted diarrhea. Reports she has to go straight to the bathroom after breakfast/lunch at work. Has occasionally had accidents.   Notes that Ozempic has decreased her appetite, but she is still eating, just smaller portion sizes.   Date of Download: 8/23-9/5 % Time CGM is active: 95% Average Glucose: 162 mg/dL Glucose Management Indicator: 7.2%  Glucose Variability: 22.3 (goal <36%) Time in Goal:  - Time in range 70-180: 71% - Time above range: 29% - Time below range: 0%  Patient reports hypoglycemic s/sx including dizziness, shakiness, sweating; if sugars are in the 90s  Current meal patterns:  - Breakfast: biscuit and gravy, sometimes hash brown  - Lunch:  hot ham and cheese, hot dogs; chicken wraps  - Supper: sometimes a snack; sandwich - [place], ham sandwich  Works at [employer], eats breakfast and lunch at work   Heart Failure:  Current medications:  ACEi/ARB/ARNI: lisinopril 40 mg daily  SGLT2i: Jardiance 10 mg daily Beta blocker: carvedilol 25 mg twice daily Mineralocorticoid Receptor Antagonist: none  Diuretic regimen: furosemide 40 mg daily PRN - not using frequently  Current home blood pressure readings: reports she does not have a home BP machine  Hyperlipidemia/ASCVD Risk Reduction  Current lipid lowering medications: atorvastatin 80 mg daily, ezetimibe 10 mg daily  Antiplatelet regimen: aspirin 81 mg daily   COPD:  Current medications: Spiriva 2.5 mcg 2 puffs daily, Advair 100/50 mcg 2 puffs twice daily - not using; albuterol HFA PRN - using every morning  Has not started nicotine replacement therapy yet. Reports she has a lot going on right now.    Objective:  Lab Results  Component Value Date   HGBA1C 10.2 (A) 03/19/2023    Lab Results  Component Value Date   CREATININE 0.70 05/02/2023   BUN 28 (H) 03/04/2023   NA 138 03/04/2023   K 4.0 03/04/2023   CL 99 03/04/2023   CO2 24 03/04/2023    Lab Results  Component Value Date   CHOL 121 03/04/2023   HDL 37 (L) 03/04/2023   LDLCALC 67 03/04/2023   TRIG 86 03/04/2023   CHOLHDL 3.3 03/04/2023    Medications Reviewed Today     Reviewed by Alden Hipp, RPH-CPP (Pharmacist) on 06/17/23 at 1423  Med List Status: <None>  Medication Order Taking? Sig Documenting Provider Last Dose Status Informant  albuterol (PROVENTIL) (2.5 MG/3ML) 0.083% nebulizer solution [id number] Yes Take 3 mLs (2.5 mg total) by nebulization every 4 (four) hours as needed for wheezing or shortness of breath. Ivonne Andrew, NP Taking Active Self  albuterol (VENTOLIN HFA) 108 (90 Base) MCG/ACT inhaler [id number] Yes Inhale 2 puffs into the lungs every 6 (six) hours as needed for  wheezing or shortness of breath. Ivonne Andrew, NP Taking Active Self  aspirin EC 81 MG tablet [id number] Yes Take 1 tablet (81 mg total) by mouth daily with breakfast. Swallow whole. Shon Hale, MD Taking Active Self  atorvastatin (LIPITOR) 80 MG tablet [id number] Yes Take 1 tablet (80 mg total) by mouth daily. Mallipeddi, Vishnu P, MD Taking Active   blood glucose meter kit and supplies KIT [id number]  Dispense based on patient and insurance preference. Use up to four times daily as directed. Orion Crook I, NP  Active Self  carvedilol (COREG) 25 MG tablet [id number] Yes Take 1 tablet (25 mg total) by mouth 2 (two) times daily with a meal. Mallipeddi, Vishnu P, MD Taking Active   Continuous Glucose Sensor (FREESTYLE LIBRE 3 SENSOR) MISC [id number] Yes Place 1 sensor on the skin every 14 days. Use to check glucose continuously Ivonne Andrew, NP Taking Active            Med Note Clearance Coots, Danaisha Celli T   Tue Jun 17, 2023  2:15 PM)    cyanocobalamin (VITAMIN B12) 1000 MCG/ML injection [id number] No Inject 1 mL (1,000 mcg total) into the skin once a week. 1ml injection weeklyX4 then once a month  Patient not taking: Reported on 06/17/2023   Malachy Mood, MD Not Taking Active   dicyclomine (BENTYL) 10 MG capsule [id number] Yes Take 1 capsule (10 mg total) by mouth 4 (four) times daily -  before meals and at bedtime. Gelene Mink, NP Taking Active Self  empagliflozin (JARDIANCE) 10 MG TABS tablet [id number] Yes Take 1 tablet (10 mg total) by mouth daily before breakfast. Ivonne Andrew, NP Taking Active Self           Med Note Clearance Coots, Alanda Slim Feb 20, 2023  8:58 AM)    ezetimibe (ZETIA) 10 MG tablet [id number] Yes Take 1 tablet (10 mg total) by mouth daily. Mallipeddi, Vishnu P, MD Taking Active   fluticasone-salmeterol (ADVAIR) 100-50 MCG/ACT AEPB [id number] No Inhale 1 puff into the lungs 2 (two) times daily.  Patient not taking: Reported on 06/17/2023   Ivonne Andrew, NP Not Taking  Active   folic acid (FOLVITE) 800 MCG tablet [id number] Yes Take 1 tablet by mouth once daily Ivonne Andrew, NP Taking Active   furosemide (LASIX) 40 MG tablet [id number] Yes Take 1 tablet (40 mg total) by mouth daily. Mallipeddi, Orion Modest, MD Taking Active            Med Note Clearance Coots, Nadean Montanaro T   Tue Jun 17, 2023  2:20 PM) PRN  ibuprofen (ADVIL) 200 MG tablet [id number]  Take 800 mg by mouth every 8 (eight) hours as needed (pain.).  Patient not taking: Reported on 05/08/2023   [provider]  Active Self  insulin glargine (LANTUS SOLOSTAR) 100 UNIT/ML Solostar Pen [id number] Yes Inject 16 Units into the skin daily. Ivonne Andrew, NP Taking Active   lisinopril (ZESTRIL) 40 MG tablet [id number] Yes Take 1 tablet (40 mg total) by mouth daily. Ivonne Andrew, NP Taking  Active Self  metFORMIN (GLUCOPHAGE-XR) 500 MG 24 hr tablet [id number] Yes Take 1 tablet (500 mg total) by mouth 2 (two) times daily with a meal. Corky Crafts, MD Taking Active            Med Note Clearance Coots, Lexys Milliner T   Tue Jun 17, 2023  2:21 PM) Taking 2 QAM  Nebulizers (COMPRESSOR/NEBULIZER) MISC [id number] No 1 Units by Does not apply route as needed.  Patient not taking: Reported on 06/17/2023   Shon Hale, MD Not Taking Active Self  nicotine (NICODERM CQ - DOSED IN MG/24 HOURS) 21 mg/24hr patch [id number] No Place 1 patch (21 mg total) onto the skin daily.  Patient not taking: Reported on 06/17/2023   Coralyn Helling, MD Not Taking Active   nitroGLYCERIN (NITROSTAT) 0.4 MG SL tablet [id number]  Place 1 tablet (0.4 mg total) under the tongue every 5 (five) minutes x 3 doses as needed for chest pain (If no relief after 3rd dose proceed to ED or call 911). Mallipeddi, Vishnu P, MD  Active   omeprazole (PRILOSEC) 20 MG capsule [id number] Yes Take 1 capsule (20 mg total) by mouth daily. Ivonne Andrew, NP Taking Active Self  PAIN RELIEVING LIDOCAINE EX [id number]  Apply 1 Application topically daily. [provider]  Active Self  ranolazine (RANEXA) 500 MG 12 hr tablet [id number] Yes Take 1 tablet (500 mg total) by mouth 2 (two) times daily. Mallipeddi, Vishnu P, MD Taking Active   Semaglutide,0.25 or 0.5MG /DOS, (OZEMPIC, 0.25 OR 0.5 MG/DOSE,) 2 MG/3ML SOPN [id number] Yes Inject 0.5 mg into the skin once a week. Ivonne Andrew, NP Taking Active   SV IRON 325 (65 Fe) MG tablet [id number] Yes Take 1 tablet by mouth once daily Ivonne Andrew, NP Taking Active Self           Med Note Clearance Coots, Wellington Winegarden T   Tue Jun 17, 2023  2:23 PM)    Tiotropium Bromide Monohydrate (SPIRIVA RESPIMAT) 2.5 MCG/ACT AERS [id number] Yes Inhale 2 puffs into the lungs daily. Ivonne Andrew, NP Taking Active               Assessment/Plan:   Diabetes: - Currently uncontrolled but improved - Reviewed long term cardiovascular and renal outcomes of uncontrolled blood sugar - Reviewed goal A1c, goal fasting, and goal 2 hour post prandial glucose - Recommend to hold metformin to see if any impact on diarrhea. Patient sees GI next week, will follow up with them. Considered Lantus dose increased, but given reports of symptomatic hypoglycemia, will defer at this time. Continue Ozempic 0.5 mg weekly, Lantus 18 units daily, Jardiance 10 mg daily - Recommend to check glucose using CGM  Heart Failure: - Currently appropriately managed - Reviewed appropriate blood pressure monitoring technique and reviewed goal blood pressure. Will collaborate with [facility] to bill Medicaid for cuff.  - Recommend to continue current regimen. Some BP readings at clinic visits have been elevated, some have been in goal, so likely some component of white coat hypertension.   Hyperlipidemia/ASCVD Risk Reduction: - Currently controlled.  - Recommend to continue current regimen. Will collaborate with PCP to refill atorvastatin  COPD: - Currently uncontrolled due to lack of Advair. Discussed goal of Advair + Spiriva. Will  collaborate with PCP to send refills.  Follow Up Plan: phone call in 3 weeks  Catie TClearance Coots, PharmD, BCACP, CPP Clinical Pharmacist [facility] [phone]

## 2023-06-19 ENCOUNTER — Inpatient Hospital Stay (HOSPITAL_BASED_OUTPATIENT_CLINIC_OR_DEPARTMENT_OTHER): Payer: Medicaid Other | Admitting: Hematology

## 2023-06-19 VITALS — BP 186/65 | HR 69 | Temp 97.7°F | Resp 17 | Wt 138.4 lb

## 2023-06-19 DIAGNOSIS — D519 Vitamin B12 deficiency anemia, unspecified: Secondary | ICD-10-CM

## 2023-06-19 MED ORDER — CYANOCOBALAMIN 1000 MCG/ML IJ SOLN: 1000.0000 ug | INTRAMUSCULAR | 1 refills | Status: DC

## 2023-06-19 NOTE — Progress Notes (Signed)
[facility]   [phone] [phone]   Clinic Follow up Note   Patient Care Team: Ivonne Andrew, NP as PCP - General (Pulmonary Disease) Marjo Bicker, MD as PCP - Cardiology (Cardiology) Alden Hipp, RPH-CPP (Pharmacist) Danie Chandler, RN as Case Manager  Date of Service:  06/19/2023  CHIEF COMPLAINT: f/u of anemia, B12 deficiency   CURRENT THERAPY:  B 12 injections monthly   ASSESSMENT:  Misty Mccullough is a 52 y.o. female with   Anemia due to vitamin B12 deficiency -She developed mild anemia in 2020 that normalized in 2022, and recurred/progressed since 11/2021 hgb 10.8 -She had acute anemia during recent hospitalizations for PNA, COPD and CHF exacerbations, lowest Hgb 7.3 -B12 was normal in 2022; iron studies 12/2022 showed ferritin 25 and 10% saturation. She was started on oral iron and folic acid 12/2022 -lab on 01/30/2023 showed low B12 at 137 -I started her on B12 injection in 03/2023 and she is on home injections now.  She ran out of the medicine about a month ago, I refilled for her today. - intrinsic factor was negative  -Labs from last week reviewed, B12 273, slightly low, I recommend her to restart B12 injection weekly for 4 injections, then changed to monthly    PLAN: -lab reviewed from 9/5 -I refill B12 injections, she will restart weeklyX4, then changed to monthly -I recommend taking Oral B12  -Repeat lab in 3 months -lab and f/u in 6 months   INTERVAL HISTORY:  Misty Mccullough is here for a follow up of anemia, B12 deficiency. She was last seen by me on 03/10/2023. She presents to the clinic alone. Pt state that she is doing well.  She has finished the prescribed B121-2 months ago, and did not call me to refill.  She denies new complain.   All other systems were reviewed with the patient and are negative.  MEDICAL HISTORY:  Past Medical History:  Diagnosis Date   Bilateral lower extremity edema    CHF  (congestive heart failure) (HCC)    COPD (chronic obstructive pulmonary disease) (HCC)    Cough    Diabetes mellitus without complication (HCC)    Gallstones    GERD (gastroesophageal reflux disease)    Heavy cigarette smoker    Hemoglobin A1C between 7% and 9% indicating borderline diabetic control (HCC) 08/2019   Hypercholesteremia    Hyperglycemia    Hypertension    Shortness of breath    Stroke (cerebrum) (HCC)    Vitamin D deficiency 12/2020    SURGICAL HISTORY: Past Surgical History:  Procedure Laterality Date   BIOPSY  02/19/2023   Procedure: BIOPSY;  Surgeon: Corbin Ade, MD;  Location: [facility];  Service: Endoscopy;;   COLONOSCOPY WITH PROPOFOL N/A 02/19/2023   Procedure: COLONOSCOPY WITH PROPOFOL;  Surgeon: Corbin Ade, MD;  Location: [facility];  Service: Endoscopy;  Laterality: N/A;  8:15 am, asa 3   ESOPHAGOGASTRODUODENOSCOPY (EGD) WITH PROPOFOL N/A 02/19/2023   Procedure: ESOPHAGOGASTRODUODENOSCOPY (EGD) WITH PROPOFOL;  Surgeon: Corbin Ade, MD;  Location: [facility];  Service: Endoscopy;  Laterality: N/A;   LEFT HEART CATH AND CORONARY ANGIOGRAPHY N/A 03/07/2023   Procedure: LEFT HEART CATH AND CORONARY ANGIOGRAPHY;  Surgeon: Corky Crafts, MD;  [facility] INVASIVE CV LAB;  Service: Cardiovascular;  Laterality: N/A;   MALONEY DILATION N/A 02/19/2023   Procedure: Elease Hashimoto DILATION;  Surgeon: Corbin Ade, MD;  Location: [facility];  Service:  Endoscopy;  Laterality: N/A;   TUBAL LIGATION      I have reviewed the social history and family history with the patient and they are unchanged from previous note.  ALLERGIES:  has No Known Allergies.  MEDICATIONS:  Current Outpatient Medications  Medication Sig Dispense Refill   albuterol (PROVENTIL) (2.5 MG/3ML) 0.083% nebulizer solution Take 3 mLs (2.5 mg total) by nebulization every 4 (four) hours as needed for wheezing or shortness of breath. 120 mL 2   albuterol (VENTOLIN HFA) 108 (90  Base) MCG/ACT inhaler Inhale 2 puffs into the lungs every 6 (six) hours as needed for wheezing or shortness of breath. 1 each 11   aspirin EC 81 MG tablet Take 1 tablet (81 mg total) by mouth daily with breakfast. Swallow whole. 30 tablet 12   atorvastatin (LIPITOR) 80 MG tablet Take 1 tablet (80 mg total) by mouth daily. 90 tablet 3   blood glucose meter kit and supplies KIT Dispense based on patient and insurance preference. Use up to four times daily as directed. 1 each 1   carvedilol (COREG) 25 MG tablet Take 1 tablet (25 mg total) by mouth 2 (two) times daily with a meal. 180 tablet 2   Continuous Glucose Sensor (FREESTYLE LIBRE 3 SENSOR) MISC Place 1 sensor on the skin every 14 days. Use to check glucose continuously 6 each 3   cyanocobalamin (VITAMIN B12) 1000 MCG/ML injection Inject 1 mL (1,000 mcg total) into the skin once a week. 1ml injection weeklyX4 then once a month (Patient not taking: Reported on 06/17/2023) 10 mL 0   dicyclomine (BENTYL) 10 MG capsule Take 1 capsule (10 mg total) by mouth 4 (four) times daily -  before meals and at bedtime. 120 capsule 3   empagliflozin (JARDIANCE) 10 MG TABS tablet Take 1 tablet (10 mg total) by mouth daily before breakfast. 90 tablet 1   ezetimibe (ZETIA) 10 MG tablet Take 1 tablet (10 mg total) by mouth daily. 90 tablet 1   fluticasone-salmeterol (ADVAIR) 100-50 MCG/ACT AEPB Inhale 1 puff into the lungs 2 (two) times daily. 180 each 1   folic acid (FOLVITE) 800 MCG tablet Take 1 tablet by mouth once daily 90 tablet 0   furosemide (LASIX) 40 MG tablet Take 1 tablet (40 mg total) by mouth daily. 135 tablet 0   ibuprofen (ADVIL) 200 MG tablet Take 800 mg by mouth every 8 (eight) hours as needed (pain.). (Patient not taking: Reported on 05/08/2023)     insulin glargine (LANTUS SOLOSTAR) 100 UNIT/ML Solostar Pen Inject 16 Units into the skin daily. 15 mL 1   lisinopril (ZESTRIL) 40 MG tablet Take 1 tablet (40 mg total) by mouth daily. 90 tablet 1    metFORMIN (GLUCOPHAGE-XR) 500 MG 24 hr tablet Take 1 tablet (500 mg total) by mouth 2 (two) times daily with a meal. 180 tablet 1   Nebulizers (COMPRESSOR/NEBULIZER) MISC 1 Units by Does not apply route as needed. (Patient not taking: Reported on 06/17/2023) 1 each 0   nicotine (NICODERM CQ - DOSED IN MG/24 HOURS) 21 mg/24hr patch Place 1 patch (21 mg total) onto the skin daily. (Patient not taking: Reported on 06/17/2023) 28 patch 0   nitroGLYCERIN (NITROSTAT) 0.4 MG SL tablet Place 1 tablet (0.4 mg total) under the tongue every 5 (five) minutes x 3 doses as needed for chest pain (If no relief after 3rd dose proceed to ED or call 911). 25 tablet 3   omeprazole (PRILOSEC) 20 MG capsule Take 1  capsule (20 mg total) by mouth daily. 90 capsule 1   PAIN RELIEVING LIDOCAINE EX Apply 1 Application topically daily.     ranolazine (RANEXA) 500 MG 12 hr tablet Take 1 tablet (500 mg total) by mouth 2 (two) times daily. 60 tablet 3   Semaglutide,0.25 or 0.5MG /DOS, (OZEMPIC, 0.25 OR 0.5 MG/DOSE,) 2 MG/3ML SOPN Inject 0.5 mg into the skin once a week. 9 mL 1   SV IRON 325 (65 Fe) MG tablet Take 1 tablet by mouth once daily 90 tablet 0   Tiotropium Bromide Monohydrate (SPIRIVA RESPIMAT) 2.5 MCG/ACT AERS Inhale 2 puffs into the lungs daily. 12 g 1   No current facility-administered medications for this visit.    PHYSICAL EXAMINATION: ECOG PERFORMANCE STATUS: 0 - Asymptomatic  Vitals:   06/19/23 1327  BP: (!) 186/65  Pulse: 69  Resp: 17  Temp: 97.7 F (36.5 C)  SpO2: 100%   Wt Readings from Last 3 Encounters:  06/19/23 138 lb 6.4 oz (62.8 kg)  04/29/23 136 lb 12.8 oz (62.1 kg)  04/15/23 134 lb 12.8 oz (61.1 kg)     GENERAL:alert, no distress and comfortable SKIN: skin color normal, no rashes or significant lesions EYES: normal, Conjunctiva are pink and non-injected, sclera clear  NEURO: alert & oriented x 3 with fluent speech  LABORATORY DATA:  I have reviewed the data as listed    Latest Ref  Rng & Units 06/12/2023    2:18 PM 06/12/2023    2:15 PM 03/10/2023    1:16 PM  CBC  WBC 4.0 - 10.5 K/uL 4.7  DUPLICATE REQUEST  4.4   Hemoglobin 12.0 - 15.0 g/dL 62.9  DUPLICATE REQUEST  10.8   Hematocrit 36.0 - 46.0 % 36.7  DUPLICATE REQUEST  33.2   Platelets 150 - 400 K/uL 169  DUPLICATE REQUEST  169         Latest Ref Rng & Units 05/02/2023   12:32 PM 04/29/2023    3:16 PM 03/04/2023    1:20 PM  CMP  Glucose 70 - 99 mg/dL   528   BUN 6 - 20 mg/dL   28   Creatinine 4.13 - 1.00 mg/dL 2.44   0.10   Sodium 272 - 145 mmol/L   138   Potassium 3.5 - 5.1 mmol/L   4.0   Chloride 98 - 111 mmol/L   99   CO2 22 - 32 mmol/L   24   Calcium 8.9 - 10.3 mg/dL   9.0   Total Protein 6.5 - 8.1 g/dL  6.3    Total Bilirubin 0.3 - 1.2 mg/dL  0.3    Alkaline Phos 38 - 126 U/L  86    AST 15 - 41 U/L  15    ALT 0 - 44 U/L  29        RADIOGRAPHIC STUDIES: I have personally reviewed the radiological images as listed and agreed with the findings in the report. No results found.    No orders of the defined types were placed in this encounter.  All questions were answered. The patient knows to call the clinic with any problems, questions or concerns. No barriers to learning was detected. The total time spent in the appointment was 15 minutes.     Malachy Mood, MD 06/19/2023   Carolin Coy, CMA, am acting as scribe for Malachy Mood, MD.   I have reviewed the above documentation for accuracy and completeness, and I agree with the above.

## 2023-06-19 NOTE — Assessment & Plan Note (Signed)
-  She developed mild anemia in 2020 that normalized in 2022, and recurred/progressed since 11/2021 hgb 10.8 -She had acute anemia during recent hospitalizations for PNA, COPD and CHF exacerbations, lowest Hgb 7.3 -B12 was normal in 2022; iron studies 12/2022 showed ferritin 25 and 10% saturation. She was started on oral iron and folic acid 12/2022 -lab on 01/30/2023 showed low B12 at 137 -I started her on B12 injection in 03/2023 and she is on home injections now  - intrinsic factor was negative

## 2023-06-20 ENCOUNTER — Encounter: Payer: Self-pay | Admitting: Nurse Practitioner

## 2023-06-20 ENCOUNTER — Encounter: Payer: Self-pay | Admitting: Hematology

## 2023-06-20 ENCOUNTER — Telehealth: Payer: Self-pay | Admitting: Hematology

## 2023-06-20 ENCOUNTER — Ambulatory Visit (INDEPENDENT_AMBULATORY_CARE_PROVIDER_SITE_OTHER): Payer: Medicaid Other | Admitting: Nurse Practitioner

## 2023-06-20 VITALS — BP 162/60 | HR 82 | Ht 67.0 in | Wt 138.0 lb

## 2023-06-20 DIAGNOSIS — Z23 Encounter for immunization: Secondary | ICD-10-CM | POA: Diagnosis not present

## 2023-06-20 DIAGNOSIS — D649 Anemia, unspecified: Secondary | ICD-10-CM | POA: Diagnosis not present

## 2023-06-20 DIAGNOSIS — E119 Type 2 diabetes mellitus without complications: Secondary | ICD-10-CM | POA: Diagnosis not present

## 2023-06-20 DIAGNOSIS — I10 Essential (primary) hypertension: Secondary | ICD-10-CM | POA: Diagnosis not present

## 2023-06-20 LAB — POCT GLYCOSYLATED HEMOGLOBIN (HGB A1C): Hemoglobin A1C: 7.5 % — AB (ref 4.0–5.6)

## 2023-06-20 MED ORDER — BLOOD PRESSURE KIT
1.0000 [IU] | PACK | Freq: Every day | 0 refills | Status: AC
Start: 2023-06-20 — End: ?

## 2023-06-20 MED ORDER — CETIRIZINE HCL 10 MG PO TABS: 10.0000 mg | ORAL_TABLET | Freq: Every day | ORAL | 11 refills | Status: AC

## 2023-06-20 MED ORDER — CLONIDINE HCL 0.1 MG PO TABS
0.1000 mg | ORAL_TABLET | Freq: Once | ORAL | Status: AC
Start: 2023-06-20 — End: 2023-06-20
  Administered 2023-06-20: 0.1 mg via ORAL

## 2023-06-20 MED ORDER — EMPAGLIFLOZIN 10 MG PO TABS
10.0000 mg | ORAL_TABLET | Freq: Every day | ORAL | 1 refills | Status: DC
Start: 2023-06-20 — End: 2024-02-16

## 2023-06-20 NOTE — Patient Instructions (Addendum)
1. Need for influenza vaccination  - Flu vaccine trivalent PF, 6mos and older(Flulaval,Afluria,Fluarix,Fluzone)  2. Anemia, unspecified type  - Iron, TIBC and Ferritin Panel - CBC - Comprehensive metabolic panel  3. Type 2 diabetes mellitus without complication, without long-term current use of insulin (HCC)  - POCT glycosylated hemoglobin (Hb A1C)  4. Essential hypertension  - cloNIDine (CATAPRES) tablet 0.1 mg   Follow up:  Follow up in 3 months

## 2023-06-20 NOTE — Progress Notes (Signed)
Subjective   Patient ID: Misty Mccullough, female    DOB: 1971-02-15, 52 y.o.   MRN: [id number]  Chief Complaint  Patient presents with   Follow-up    Referring provider: Ivonne Andrew, NP  Misty Mccullough is a 52 y.o. female with Past Medical History: No date: Bilateral lower extremity edema No date: CHF (congestive heart failure) (HCC) No date: COPD (chronic obstructive pulmonary disease) (HCC) No date: Cough No date: Diabetes mellitus without complication (HCC) No date: Gallstones No date: GERD (gastroesophageal reflux disease) No date: Heavy cigarette smoker 08/2019: Hemoglobin A1C between 7% and 9% indicating borderline  diabetic control (HCC) No date: Hypercholesteremia No date: Hyperglycemia No date: Hypertension No date: Shortness of breath No date: Stroke (cerebrum) (HCC) 12/2020: Vitamin D deficiency   HPI    Patient presents today for follow-up visit.  She did recently have blood work through hematology.  She has been started on vitamin B12 injections.  Patient has followed with cardiology and did have a heart cath.  Pharmacy will follow with her to adjust her other diabetic medications.  A1c in office today is 7.5 which has improved from 10.2 at last visit.  Patient states that she is having seasonal allergies.  We will start her on Zyrtec.  Denies f/c/s, n/v/d, hemoptysis, PND, leg swelling. Denies chest pain or edema.   Note: Patient's blood pressure is elevated in office today.  Patient states that she did take her medications today.  We will give him clonidine in office today.  No Known Allergies  Immunization History  Administered Date(s) Administered   Influenza Whole 06/21/2011   Influenza, Seasonal, Injecte, Preservative Fre 06/20/2023   Influenza,inj,Quad PF,6+ Mos 09/06/2016, 08/17/2018, 08/25/2019, 06/29/2021, 11/15/2022   PFIZER Comirnaty(Gray Top)Covid-19 Tri-Sucrose Vaccine 01/15/2020, 02/09/2020   Pneumococcal Conjugate-13 08/25/2019    Pneumococcal Polysaccharide-23 12/06/2016   Pneumococcal-Unspecified 06/21/2011   Tdap 12/06/2016    Tobacco History: Social History   Tobacco Use  Smoking Status Every Day   Current packs/day: 1.00   Average packs/day: 1 pack/day for 32.0 years (32.0 ttl pk-yrs)   Types: Cigarettes  Smokeless Tobacco Never  Tobacco Comments   Since age 62.   Ready to quit: Not Answered Counseling given: Not Answered Tobacco comments: Since age 13.   Outpatient Encounter Medications as of 06/20/2023  Medication Sig   albuterol (PROVENTIL) (2.5 MG/3ML) 0.083% nebulizer solution Take 3 mLs (2.5 mg total) by nebulization every 4 (four) hours as needed for wheezing or shortness of breath.   albuterol (VENTOLIN HFA) 108 (90 Base) MCG/ACT inhaler Inhale 2 puffs into the lungs every 6 (six) hours as needed for wheezing or shortness of breath.   aspirin EC 81 MG tablet Take 1 tablet (81 mg total) by mouth daily with breakfast. Swallow whole.   atorvastatin (LIPITOR) 80 MG tablet Take 1 tablet (80 mg total) by mouth daily.   blood glucose meter kit and supplies KIT Dispense based on patient and insurance preference. Use up to four times daily as directed.   Blood Pressure KIT 1 Units by Does not apply route daily.   carvedilol (COREG) 25 MG tablet Take 1 tablet (25 mg total) by mouth 2 (two) times daily with a meal.   cetirizine (ZYRTEC) 10 MG tablet Take 1 tablet (10 mg total) by mouth daily.   Continuous Glucose Sensor (FREESTYLE LIBRE 3 SENSOR) MISC Place 1 sensor on the skin every 14 days. Use to check glucose continuously   cyanocobalamin (VITAMIN B12) 1000 MCG/ML injection Inject 1  mL (1,000 mcg total) into the skin once a week. 1ml injection weeklyX4 then once a month indefinitely   dicyclomine (BENTYL) 10 MG capsule Take 1 capsule (10 mg total) by mouth 4 (four) times daily -  before meals and at bedtime.   ezetimibe (ZETIA) 10 MG tablet Take 1 tablet (10 mg total) by mouth daily.    fluticasone-salmeterol (ADVAIR) 100-50 MCG/ACT AEPB Inhale 1 puff into the lungs 2 (two) times daily.   folic acid (FOLVITE) 800 MCG tablet Take 1 tablet by mouth once daily   furosemide (LASIX) 40 MG tablet Take 1 tablet (40 mg total) by mouth daily.   ibuprofen (ADVIL) 200 MG tablet Take 800 mg by mouth every 8 (eight) hours as needed (pain.).   insulin glargine (LANTUS SOLOSTAR) 100 UNIT/ML Solostar Pen Inject 16 Units into the skin daily.   lisinopril (ZESTRIL) 40 MG tablet Take 1 tablet (40 mg total) by mouth daily.   metFORMIN (GLUCOPHAGE-XR) 500 MG 24 hr tablet Take 1 tablet (500 mg total) by mouth 2 (two) times daily with a meal.   Nebulizers (COMPRESSOR/NEBULIZER) MISC 1 Units by Does not apply route as needed.   nicotine (NICODERM CQ - DOSED IN MG/24 HOURS) 21 mg/24hr patch Place 1 patch (21 mg total) onto the skin daily.   nitroGLYCERIN (NITROSTAT) 0.4 MG SL tablet Place 1 tablet (0.4 mg total) under the tongue every 5 (five) minutes x 3 doses as needed for chest pain (If no relief after 3rd dose proceed to ED or call 911).   omeprazole (PRILOSEC) 20 MG capsule Take 1 capsule (20 mg total) by mouth daily.   PAIN RELIEVING LIDOCAINE EX Apply 1 Application topically daily.   ranolazine (RANEXA) 500 MG 12 hr tablet Take 1 tablet (500 mg total) by mouth 2 (two) times daily.   Semaglutide,0.25 or 0.5MG /DOS, (OZEMPIC, 0.25 OR 0.5 MG/DOSE,) 2 MG/3ML SOPN Inject 0.5 mg into the skin once a week.   SV IRON 325 (65 Fe) MG tablet Take 1 tablet by mouth once daily   Tiotropium Bromide Monohydrate (SPIRIVA RESPIMAT) 2.5 MCG/ACT AERS Inhale 2 puffs into the lungs daily.   [DISCONTINUED] empagliflozin (JARDIANCE) 10 MG TABS tablet Take 1 tablet (10 mg total) by mouth daily before breakfast.   empagliflozin (JARDIANCE) 10 MG TABS tablet Take 1 tablet (10 mg total) by mouth daily before breakfast.   [EXPIRED] cloNIDine (CATAPRES) tablet 0.1 mg    No facility-administered encounter medications on file  as of 06/20/2023.    Review of Systems  Review of Systems  Constitutional: Negative.   HENT:  Positive for congestion and postnasal drip.   Cardiovascular: Negative.   Gastrointestinal: Negative.   Allergic/Immunologic: Negative.   Neurological: Negative.   Psychiatric/Behavioral: Negative.       Objective:   BP (!) 162/60   Pulse 82   Ht 5\' 7"  (1.702 m)   Wt 138 lb (62.6 kg)   LMP  (LMP Unknown)   SpO2 98%   BMI 21.61 kg/m   Wt Readings from Last 5 Encounters:  06/20/23 138 lb (62.6 kg)  06/19/23 138 lb 6.4 oz (62.8 kg)  04/29/23 136 lb 12.8 oz (62.1 kg)  04/15/23 134 lb 12.8 oz (61.1 kg)  04/14/23 135 lb (61.2 kg)     Physical Exam Vitals and nursing note reviewed.  Constitutional:      General: She is not in acute distress.    Appearance: She is well-developed.  Cardiovascular:     Rate and Rhythm: Normal  rate and regular rhythm.  Pulmonary:     Effort: Pulmonary effort is normal.     Breath sounds: Normal breath sounds.  Neurological:     Mental Status: She is alert and oriented to person, place, and time.     Last hemoglobin A1c Lab Results  Component Value Date   HGBA1C 7.5 (A) 06/20/2023      Assessment & Plan:   Need for influenza vaccination -     Flu vaccine trivalent PF, 6mos and older(Flulaval,Afluria,Fluarix,Fluzone)  Anemia, unspecified type -     Iron, TIBC and Ferritin Panel -     CBC -     Comprehensive metabolic panel  Type 2 diabetes mellitus without complication, without long-term current use of insulin (HCC) -     POCT glycosylated hemoglobin (Hb A1C) -     Empagliflozin; Take 1 tablet (10 mg total) by mouth daily before breakfast.  Dispense: 90 tablet; Refill: 1  Essential hypertension -     cloNIDine HCl -     Blood Pressure; 1 Units by Does not apply route daily.  Dispense: 1 kit; Refill: 0 -     AMB Referral to Pharmacy Medication Management -     [facility]  Other orders -      Cetirizine HCl; Take 1 tablet (10 mg total) by mouth daily.  Dispense: 30 tablet; Refill: 11     Return in about 3 months (around 09/19/2023).   Ivonne Andrew, NP 06/20/2023

## 2023-06-24 ENCOUNTER — Ambulatory Visit: Payer: Medicaid Other | Admitting: Gastroenterology

## 2023-06-26 ENCOUNTER — Ambulatory Visit: Payer: Medicaid Other | Admitting: Gastroenterology

## 2023-06-27 ENCOUNTER — Encounter: Payer: Self-pay | Admitting: Hematology

## 2023-07-08 ENCOUNTER — Other Ambulatory Visit: Payer: Medicaid Other

## 2023-07-08 ENCOUNTER — Telehealth: Payer: Self-pay | Admitting: Pharmacist

## 2023-07-08 DIAGNOSIS — J42 Unspecified chronic bronchitis: Secondary | ICD-10-CM | POA: Diagnosis not present

## 2023-07-08 NOTE — Progress Notes (Unsigned)
Attempted to contact patient for scheduled appointment for medication management. Left HIPAA compliant message for patient to return my call at their convenience.   Will send MyChart.  Catie T. Harper, PharmD, BCACP, CPP Clinical Pharmacist [facility] [phone]  

## 2023-07-11 ENCOUNTER — Telehealth: Payer: Self-pay

## 2023-07-11 NOTE — Progress Notes (Unsigned)
Care Coordination Note  07/11/2023 Name: Misty Mccullough MRN: [id number] DOB: 1970-11-24  Misty Mccullough is a 52 y.o. year old female who is a primary care patient of Ivonne Andrew, NP and is actively engaged with the Chronic Care Management team. I reached out to [name] by phone today to assist with re-scheduling a follow up visit with the Pharmacist  Follow up plan: Unsuccessful telephone outreach attempt made. A HIPAA compliant phone message was left for the patient providing contact information and requesting a return call.  If patient returns call to provider office, please advise to call CCM Care Guide Omnia Dollinger  at [phone]  Penne Lash, RMA Care Guide [facility]  [place], [place] [address] Direct Dial: [phone] [email or web address]

## 2023-07-22 ENCOUNTER — Encounter (HOSPITAL_BASED_OUTPATIENT_CLINIC_OR_DEPARTMENT_OTHER): Payer: Self-pay

## 2023-07-23 ENCOUNTER — Other Ambulatory Visit: Payer: Self-pay | Admitting: Obstetrics and Gynecology

## 2023-07-23 NOTE — Patient Outreach (Addendum)
Medicaid Managed Care   Nurse Care Manager Note  07/23/2023 Name:  Misty Mccullough MRN:  [id number] DOB:  [birth year]  Misty Mccullough is an 52 y.o. year old female who is a primary patient of Ivonne Andrew, NP.  [facility] Managed Care Coordination team was consulted for assistance with:    Chronic healthcare management needs, DM, HTN, tobacco use, h/o CVA, CHF, COPD, HLD, PAD, cirrhosis, B12 deficiency  Ms. Tolsma was given information about Medicaid Managed Care Coordination team services today. [name] Patient agreed to services and verbal consent obtained.  Engaged with patient by telephone for follow up visit in response to provider referral for case management and/or care coordination services.   Assessments/Interventions:  Review of past medical history, allergies, medications, health status, including review of consultants reports, laboratory and other test data, was performed as part of comprehensive evaluation and provision of chronic care management services.  SDOH (Social Determinants of Health) assessments and interventions performed: SDOH Interventions    Flowsheet Row Patient Outreach Telephone from 07/23/2023 in [place] [facility] Patient Outreach Telephone from 06/05/2023 in [place] POP[facility]ATION [facility]ALTH DEPARTMENT Patient Outreach Telephone from 05/05/2023 in [place] POP[facility]ATION [facility] DEPARTMENT Patient Outreach Telephone from 04/03/2023 in [place] POP[facility]ATION HEALTH DEPARTMENT Patient Outreach Telephone from 02/28/2023 in  POPULATION HEALTH DEPARTMENT Office Visit from 02/20/2023 in [facility]  SDOH Interventions        Food Insecurity Interventions -- -- -- -- -- --  Freada Bergeron to RN CM]  Housing Interventions -- -- Intervention Not Indicated -- -- --  Transportation Interventions -- Intervention Not Indicated -- -- -- --  Utilities Interventions Intervention Not Indicated -- -- -- -- --  Alcohol Usage Interventions --  -- -- -- Intervention Not Indicated (Score <7) --  Depression Interventions/Treatment  --  [declines services] -- -- -- -- --  Financial Strain Interventions -- -- -- -- -- --  [medication access review]  Physical Activity Interventions -- -- -- -- Intervention Not Indicated --  Stress Interventions -- -- -- Intervention Not Indicated -- --  Health Literacy Interventions -- -- Intervention Not Indicated -- -- --     Care Plan No Known Allergies  Medications Reviewed Today     Reviewed by Danie Chandler, RN (Registered Nurse) on 07/23/23 at 1505  Med List Status: <None>   Medication Order Taking? Sig Documenting Provider Last Dose Status Informant  albuterol (PROVENTIL) (2.5 MG/3ML) 0.083% nebulizer solution [id number]  Take 3 mLs (2.5 mg total) by nebulization every 4 (four) hours as needed for wheezing or shortness of breath. Ivonne Andrew, NP  Active Self  albuterol (VENTOLIN HFA) 108 (90 Base) MCG/ACT inhaler [id number]  Inhale 2 puffs into the lungs every 6 (six) hours as needed for wheezing or shortness of breath. Ivonne Andrew, NP  Active Self  aspirin EC 81 MG tablet [id number]  Take 1 tablet (81 mg total) by mouth daily with breakfast. Swallow whole. Shon Hale, MD  Active Self  atorvastatin (LIPITOR) 80 MG tablet [id number]  Take 1 tablet (80 mg total) by mouth daily. Ivonne Andrew, NP  Active   blood glucose meter kit and supplies KIT [id number]  Dispense based on patient and insurance preference. Use up to four times daily as directed. Orion Crook I, NP  Active Self  Blood Pressure KIT [id number]  1 Units by Does not apply route daily. Ivonne Andrew, NP  Active   carvedilol (COREG) 25 MG  tablet [id number]  Take 1 tablet (25 mg total) by mouth 2 (two) times daily with a meal. Mallipeddi, Vishnu P, MD  Active   cetirizine (ZYRTEC) 10 MG tablet [id number]  Take 1 tablet (10 mg total) by mouth daily. Ivonne Andrew, NP  Active   Continuous Glucose Sensor (FREESTYLE  LIBRE 3 SENSOR) [place] [id number]  Place 1 sensor on the skin every 14 days. Use to check glucose continuously Ivonne Andrew, NP  Active            Med Note Clearance Coots, CATHERINE T   Tue Jun 17, 2023  2:15 PM)    cyanocobalamin (VITAMIN B12) 1000 MCG tablet [id number] Yes Take 1,000 mcg by mouth daily. [provider]  Active Self  cyanocobalamin (VITAMIN B12) 1000 MCG/ML injection [id number] No Inject 1 mL (1,000 mcg total) into the skin once a week. 1ml injection weeklyX4 then once a month indefinitely  Patient not taking: Reported on 07/23/2023   Malachy Mood, MD Not Taking Active   dicyclomine (BENTYL) 10 MG capsule [id number]  Take 1 capsule (10 mg total) by mouth 4 (four) times daily -  before meals and at bedtime. Gelene Mink, NP  Active Self  empagliflozin (JARDIANCE) 10 MG TABS tablet [id number]  Take 1 tablet (10 mg total) by mouth daily before breakfast. Ivonne Andrew, NP  Active   ezetimibe (ZETIA) 10 MG tablet [id number]  Take 1 tablet (10 mg total) by mouth daily. Mallipeddi, Vishnu P, MD  Active   fluticasone-salmeterol (ADVAIR) 100-50 MCG/ACT AEPB [id number]  Inhale 1 puff into the lungs 2 (two) times daily. Ivonne Andrew, NP  Active   folic acid (FOLVITE) 800 MCG tablet [id number]  Take 1 tablet by mouth once daily Ivonne Andrew, NP  Active   furosemide (LASIX) 40 MG tablet [id number]  Take 1 tablet (40 mg total) by mouth daily. Mallipeddi, Orion Modest, MD  Active            Med Note Clearance Coots, CATHERINE T   Tue Jun 17, 2023  2:20 PM) PRN  ibuprofen (ADVIL) 200 MG tablet [id number]  Take 800 mg by mouth every 8 (eight) hours as needed (pain.). [provider]  Active Self  insulin glargine (LANTUS SOLOSTAR) 100 UNIT/ML Solostar Pen [id number]  Inject 16 Units into the skin daily. Ivonne Andrew, NP  Active   lisinopril (ZESTRIL) 40 MG tablet [id number]  Take 1 tablet (40 mg total) by mouth daily. Ivonne Andrew, NP  Active Self  metFORMIN (GLUCOPHAGE-XR) 500 MG 24 hr  tablet [id number]  Take 1 tablet (500 mg total) by mouth 2 (two) times daily with a meal. Corky Crafts, MD  Active            Med Note Clearance Coots, CATHERINE T   Tue Jun 17, 2023  2:21 PM) Taking 2 QAM  Nebulizers (COMPRESSOR/NEBULIZER) MISC [id number]  1 Units by Does not apply route as needed. Shon Hale, MD  Active Self  nicotine (NICODERM CQ - DOSED IN MG/24 HOURS) 21 mg/24hr patch [id number]  Place 1 patch (21 mg total) onto the skin daily. Coralyn Helling, MD  Active   nitroGLYCERIN (NITROSTAT) 0.4 MG SL tablet [id number]  Place 1 tablet (0.4 mg total) under the tongue every 5 (five) minutes x 3 doses as needed for chest pain (If no relief after 3rd dose proceed to ED or call 911). Mallipeddi, Vishnu P, MD  Active   omeprazole (PRILOSEC) 20 MG capsule [id number]  Take  1 capsule (20 mg total) by mouth daily. Ivonne Andrew, NP  Active Self  PAIN Willis [place] EX [id number]  Apply 1 Application topically daily. [provider]  Active Self  ranolazine (RANEXA) 500 MG 12 hr tablet [id number]  Take 1 tablet (500 mg total) by mouth 2 (two) times daily. Mallipeddi, Vishnu P, MD  Active   Semaglutide,0.25 or 0.5MG /DOS, (OZEMPIC, 0.25 OR 0.5 MG/DOSE,) 2 MG/3ML SOPN [id number]  Inject 0.5 mg into the skin once a week. Ivonne Andrew, NP  Active   SV IRON 325 (65 Fe) MG tablet [id number]  Take 1 tablet by mouth once daily Ivonne Andrew, NP  Active Self           Med Note Clearance Coots, CATHERINE T   Tue Jun 17, 2023  2:23 PM)    Tiotropium Bromide Monohydrate (SPIRIVA RESPIMAT) 2.5 MCG/ACT AERS [id number]  Inhale 2 puffs into the lungs daily. 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   Anemia due to vitamin B12 deficiency 03/10/2023   CAD (coronary artery disease) 03/04/2023   Mitral regurgitation 03/04/2023   Low ferritin 02/25/2023   Dysphagia  02/11/2023   PAD (peripheral artery disease) (HCC) 01/22/2023   Colon cancer screening 12/25/2022   Sepsis (HCC) 12/20/2022   CAP (community acquired pneumonia) 12/20/2022   COPD with acute exacerbation (HCC) 12/20/2022   AKI (acute kidney injury) (HCC) 12/20/2022   Acute respiratory failure with hypoxia (HCC) 12/19/2022   Chronic diastolic heart failure (HCC) 11/16/2022   Acute exacerbation of Diastolic CHF (congestive heart failure) (HCC) 11/14/2022   Cough 12/13/2021   Chronic bronchitis (HCC) 12/13/2021   Shortness of breath 08/25/2019   Anxiety 08/25/2019   Hyperglycemia 08/25/2019   Hemoglobin A1C between 7% and 9% indicating borderline diabetic control (HCC) 08/25/2019   Type 2 diabetes mellitus without complication, without long-term current use of insulin (HCC) 11/24/2018   Bilateral lower extremity edema 11/24/2018   Pain and swelling of toe, left 11/04/2018   Insomnia 11/04/2018   Tobacco dependence 02/05/2018   H/O: CVA (cerebrovascular accident) 02/05/2018   Tobacco use 12/16/2017   Acute CVA (cerebrovascular accident) (HCC) 12/15/2017   Malnutrition of moderate degree 08/14/2016   Cardiac chest pain 08/13/2016   Back pain 08/13/2016   Essential hypertension 08/13/2016   HLD (hyperlipidemia) 08/13/2016   Diabetes mellitus with complication (HCC) 08/13/2016   Chest pain, exertional    Conditions to be addressed/monitored per PCP order:  Chronic healthcare management needs, DM, HTN, tobacco use, h/o CVA, CHF, COPD, HLD, PAD, cirrhosis, B12 deficiency  Care Plan : RN Care Manager Plan of Care  Updates made by Danie Chandler, RN since 07/23/2023 12:00 AM     Problem: Health Promotion or Disease Self-Management (General Plan of Care)      Long-Range Goal: Chronic Disease Management   Start Date: 02/28/2023  Expected End Date: 10/23/2023  Priority: High  Note:   Current Barriers:  Knowledge Deficits related to plan of care for management of HTN, h/o CVA, CHF, COPD,  DM2, HLD, anemia, tobacco use, anxiety,  Chronic Disease Management support and education needs related to HTN, h/o CVA, CHF, COPD, DM2, HLD, anemia, tobacco use, anxiety,  Financial Constraints  07/23/23:  Patient complaining of pelvic and rectal pressure for 3 weeks, clear d/c for  2 weeks-has PCP appt this week.  BP stable at home.  Smokes 1/2 ppd and using patch.    RNCM Clinical Goal(s):  Patient will verbalize understanding of plan for management of HTN, h/o CVA, CHF, COPD, DM2, HLD, anemia, tobacco use, anxiety,  as evidenced by patient report verbalize basic understanding of  HTN, h/o CVA, CHF, COPD, DM2, HLD, anemia, tobacco use, anxiety,  disease process and self health management plan as evidenced by patient report take all medications exactly as prescribed and will call provider for medication related questions as evidenced by patient report demonstrate understanding of rationale for each prescribed medication as evidenced by patient report attend all scheduled medical appointments as evidenced by patient report and EMR review. demonstrate ongoing adherence to prescribed treatment plan for HTN, h/o CVA, CHF, COPD, DM2, HLD, anemia, tobacco use, anxiety,  as evidenced by patient report and EMR review. continue to work with RN Care Manager to address care management and care coordination needs related to  HTN, h/o CVA, CHF, COPD, DM2, HLD, anemia, tobacco use, anxiety,  as evidenced by adherence to CM Team Scheduled appointments work with Child psychotherapist to address needed food resources  related to the management of  HTN, h/o CVA, CHF, COPD, DM2, HLD, anemia, tobacco use, anxiety, as evidenced by review of EMR and patient or Child psychotherapist report through collaboration with Medical illustrator, provider, and care team.   Interventions: Inter-disciplinary care team collaboration (see longitudinal plan of care) Evaluation of current treatment plan related to  self management and patient's adherence  to plan as established by provider  Heart Failure Interventions:  (Status:  New goal.) Long Term Goal Discussed the importance of keeping all appointments with provider Assessed social determinant of health barriers   COPD Interventions:  (Status:  New goal.) Long Term Goal Advised patient to track and manage COPD triggers Provided instruction about proper use of medications used for management of COPD including inhalers Advised patient to engage in light exercise as tolerated 3-5 days a week to aid in the the management of COPD Discussed the importance of adequate rest and management of fatigue with COPD Assessed social determinant of health barriers  Diabetes Interventions:  (Status:  New goal.) Long Term Goal Assessed patient's understanding of A1c goal: <7% Reviewed medications with patient and discussed importance of medication adherence Counseled on importance of regular laboratory monitoring as prescribed Discussed plans with patient for ongoing care management follow up and provided patient with direct contact information for care management team Reviewed scheduled/upcoming provider appointments  Advised patient, providing education and rationale, to check cbg as directed and record, calling provider  for findings outside established parameters Review of patient status, including review of consultants reports, relevant laboratory and other test results, and medications completed Assessed social determinant of health barriers Lab Results  Component Value Date   HGBA1C 7.9 (A) 12/18/2022       HGBA1C                                                      10.2                                  03/19/2023      HGBA1C  7.5                                    06/20/2023  Hyperlipidemia Interventions:  (Status:  New goal.) Long Term Goal Medication review performed; medication list updated in electronic medical record.  Provider  established cholesterol goals reviewed Counseled on importance of regular laboratory monitoring as prescribed Reviewed importance of limiting foods high in cholesterol Assessed social determinant of health barriers   Hypertension Interventions:  (Status:  New goal.) Long Term Goal Last practice recorded BP readings:  BP Readings from Last 3 Encounters:     02/20/23 (!) 180/72  06/20/23 162/60  03/19/23         134/59  Most recent eGFR/CrCl:  Lab Results  Component Value Date   EGFR 103 12/25/2022    No components found for: "CRCL"  Evaluation of current treatment plan related to hypertension self management and patient's adherence to plan as established by provider Reviewed medications with patient and discussed importance of compliance Discussed plans with patient for ongoing care management follow up and provided patient with direct contact information for care management team Advised patient, providing education and rationale, to monitor blood pressure daily and record, calling PCP for findings outside established parameters Reviewed scheduled/upcoming provider appointments including:  Assessed social determinant of health barriers  Smoking Cessation Interventions:  (Status:  New goal.) Long Term Goal Reviewed smoking history:  ; currently smoking 1/2  ppd-decreased from 2 packs a day-using patches  Evaluation of current treatment plan reviewed Advised patient to discuss smoking cessation options with provider Reviewed scheduled/upcoming provider appointments  Provided contact information for [place] [facility] ([email or web address]-[email or web address]) Discussed plans with patient for ongoing care management follow up and provided patient with direct contact information for care management team Assessed social determinant of health barriers  Patient Goals/Self-Care Activities: Take all medications as prescribed Attend all scheduled provider appointments Call pharmacy for medication refills 3-7 days in  advance of running out of medications Perform all self care activities independently  Perform IADL's (shopping, preparing meals, housekeeping, managing finances) independently Call provider office for new concerns or questions  Work with the social worker to address care coordination needs and will continue to work with the clinical team to address health care and disease management related needs  Follow Up Plan:  The patient has been provided with contact information for the care management team and has been advised to call with any health related questions or concerns.  The care management team will reach out to the patient again over the next 30 business  days.   Follow Up:  Patient agrees to Care Plan and Follow-up.  Plan: The Managed Medicaid care management team will reach out to the patient again over the next 30 business  days. and The  Patient has been provided with contact information for the Managed Medicaid care management team and has been advised to call with any health related questions or concerns.  Date/time of next scheduled RN care management/care coordination outreach: 08/25/23 at 1230

## 2023-07-23 NOTE — Patient Instructions (Signed)
Hey Ms. Strassner, thank you for the updates-have a nice afternoon!  Ms. Karim was given information about Medicaid Managed Care team care coordination services as a part of their [facility] Medicaid benefit. [name] verbally consented to engagement with the [facility] Managed Care team.   If you are experiencing a medical emergency, please call 911 or report to your local emergency department or urgent care.   If you have a non-emergency medical problem during routine business hours, please contact your provider's office and ask to speak with a nurse.   For questions related to your [facility], please call: [phone] or visit the homepage here: [email or web address]  If you would like to schedule transportation through your [facility], please call the following number at least 2 days in advance of your appointment: [phone]   Rides for urgent appointments can also be made after hours by calling Member Services.  Call the Behavioral Health Crisis Line at [phone], at any time, 24 hours a day, 7 days a week. If you are in danger or need immediate medical attention call 911.  If you would like help to quit smoking, call 1-800-QUIT-NOW ((847) 240-1556) OR Espaol: 1-855-Djelo-Ya (7-253-664-4034) o para ms informacin haga clic aqu or Text READY to 742-595 to register via text  Ms. Wagoner - following are the goals we discussed in your visit today:   Goals Addressed    Timeframe:  Long-Range Goal Priority:  High Start Date:  02/28/23                           Expected End Date: ongoing                      Follow Up Date: 08/25/23   - schedule appointment for vaccines needed due to my age or health - schedule recommended health tests (blood work, mammogram, colonoscopy, pap test) - schedule and keep appointment for annual check-up    Why is this important?    Screening tests can find diseases early when they are easier to treat.  Your doctor or nurse will talk with you about which tests are important for you.  Getting shots for common diseases like the flu and shingles will help prevent them.   08/25/23:  PCP appt this week, recently seen by [facility]EM[facility].  Ref[facility] to [facility]  Patient verbalizes understanding of instructions and care plan provided today and agrees to view in MyChart. Active MyChart status and patient understanding of how to access instructions and care plan via MyChart confirmed with patient.     The Managed Medicaid care management team will reach out to the patient again over the next 30 business  days.  The  Patient has been provided with contact information for the Managed Medicaid care management team and has been advised to call with any health related questions or concerns.   Kathi Der RN, BSN [facility]  [facility] Care Management Coordinator - Managed Medicaid High Risk [phone]   Following is a copy of your plan of care:  Care Plan : RN Care Manager Plan of Care  Updates made by Danie Chandler, RN since 07/23/2023 12:00 AM     Problem: Health Promotion or Disease Self-Management (General Plan of Care)      Long-Range Goal: Chronic Disease Management   Start Date: 02/28/2023  Expected End Date: 10/23/2023  Priority: High  Note:   Current Barriers:  Knowledge Deficits related to plan of care for management of HTN, h/o CVA, CHF, COPD, DM2, HLD, anemia, tobacco use, anxiety,  Chronic Disease Management support and education needs related to HTN, h/o CVA, CHF, COPD, DM2, HLD, anemia, tobacco use, anxiety,  Financial Constraints  07/23/23:  Patient complaining of pelvic and rectal pressure for 3 weeks, clear d/c for 2 weeks-has PCP appt this week.  BP stable at home.  Smokes 1/2 ppd and using patch.    RNCM Clinical Goal(s):  Patient will verbalize understanding of plan for  management of HTN, h/o CVA, CHF, COPD, DM2, HLD, anemia, tobacco use, anxiety,  as evidenced by patient report verbalize basic understanding of  HTN, h/o CVA, CHF, COPD, DM2, HLD, anemia, tobacco use, anxiety,  disease process and self health management plan as evidenced by patient report take all medications exactly as prescribed and will call provider for medication related questions as evidenced by patient report demonstrate understanding of rationale for each prescribed medication as evidenced by patient report attend all scheduled medical appointments as evidenced by patient report and EMR review. demonstrate ongoing adherence to prescribed treatment plan for HTN, h/o CVA, CHF, COPD, DM2, HLD, anemia, tobacco use, anxiety,  as evidenced by patient report and EMR review. continue to work with RN Care Manager to address care management and care coordination needs related to  HTN, h/o CVA, CHF, COPD, DM2, HLD, anemia, tobacco use, anxiety,  as evidenced by adherence to CM Team Scheduled appointments work with Child psychotherapist to address needed food resources  related to the management of  HTN, h/o CVA, CHF, COPD, DM2, HLD, anemia, tobacco use, anxiety, as evidenced by review of EMR and patient or Child psychotherapist report through collaboration with Medical illustrator, provider, and care team.   Interventions: Inter-disciplinary care team collaboration (see longitudinal plan of care) Evaluation of current treatment plan related to  self management and patient's adherence to plan as established by provider  Heart Failure Interventions:  (Status:  New goal.) Long Term Goal Discussed the importance of keeping all appointments with provider Assessed social determinant of health barriers   COPD Interventions:  (Status:  New goal.) Long Term Goal Advised patient to track and manage COPD triggers Provided instruction about proper use of medications used for management of COPD including inhalers Advised patient to  engage in light exercise as tolerated 3-5 days a week to aid in the the management of COPD Discussed the importance of adequate rest and management of fatigue with COPD Assessed social determinant of health barriers  Diabetes Interventions:  (Status:  New goal.) Long Term Goal Assessed patient's understanding of A1c goal: <7% Reviewed medications with patient and discussed importance of medication adherence Counseled on importance of regular laboratory monitoring as prescribed Discussed plans with patient for ongoing care management follow up and provided patient with direct contact information for care management team Reviewed scheduled/upcoming provider appointments  Advised patient, providing education and rationale, to check cbg as directed and record, calling provider  for findings outside established parameters Review of patient status, including review of consultants reports, relevant laboratory and other test results, and medications completed Assessed social determinant of health barriers Lab Results  Component Value Date   HGBA1C 7.9 (A) 12/18/2022       HGBA1C  10.2                                  03/19/2023      HGBA1C                                                      7.5                                    06/20/2023  Hyperlipidemia Interventions:  (Status:  New goal.) Long Term Goal Medication review performed; medication list updated in electronic medical record.  Provider established cholesterol goals reviewed Counseled on importance of regular laboratory monitoring as prescribed Reviewed importance of limiting foods high in cholesterol Assessed social determinant of health barriers   Hypertension Interventions:  (Status:  New goal.) Long Term Goal Last practice recorded BP readings:  BP Readings from Last 3 Encounters:     02/20/23 (!) 180/72  06/20/23 162/60  03/19/23         134/59  Most recent eGFR/CrCl:   Lab Results  Component Value Date   EGFR 103 12/25/2022    No components found for: "CRCL"  Evaluation of current treatment plan related to hypertension self management and patient's adherence to plan as established by provider Reviewed medications with patient and discussed importance of compliance Discussed plans with patient for ongoing care management follow up and provided patient with direct contact information for care management team Advised patient, providing education and rationale, to monitor blood pressure daily and record, calling PCP for findings outside established parameters Reviewed scheduled/upcoming provider appointments including:  Assessed social determinant of health barriers  Smoking Cessation Interventions:  (Status:  New goal.) Long Term Goal Reviewed smoking history:  ; currently smoking 1/2  ppd-decreased from 2 packs a day-using patches  Evaluation of current treatment plan reviewed Advised patient to discuss smoking cessation options with provider Reviewed scheduled/upcoming provider appointments  Provided contact information for [place] [facility]uit [facility] (1-800-QUIT-NOW) Discussed plans with patient for ongoing care management follow up and provided patient with direct contact information for care management team Assessed social determinant of health barriers  Patient Goals/Self-Care Activities: Take all medications as prescribed Attend all scheduled provider appointments Call pharmacy for medication refills 3-7 days in advance of running out of medications Perform all self care activities independently  Perform IADL's (shopping, preparing meals, housekeeping, managing finances) independently Call provider office for new concerns or questions  Work with the social worker to address care coordination needs and will continue to work with the clinical team to address health care and disease management related needs  Follow Up Plan:  The patient has been provided with  contact information for the care management team and has been advised to call with any health related questions or concerns.  The care management team will reach out to the patient again over the next 30 business  days.

## 2023-07-24 ENCOUNTER — Other Ambulatory Visit: Payer: Self-pay

## 2023-07-24 MED ORDER — FREESTYLE LIBRE 3 PLUS SENSOR MISC: 1.0000 | 3 refills | Status: DC

## 2023-07-25 ENCOUNTER — Encounter: Payer: Self-pay | Admitting: Nurse Practitioner

## 2023-07-25 ENCOUNTER — Ambulatory Visit (INDEPENDENT_AMBULATORY_CARE_PROVIDER_SITE_OTHER): Payer: Medicaid Other | Admitting: Nurse Practitioner

## 2023-07-25 VITALS — BP 186/66 | HR 64 | Wt 142.6 lb

## 2023-07-25 DIAGNOSIS — B372 Candidiasis of skin and nail: Secondary | ICD-10-CM | POA: Diagnosis not present

## 2023-07-25 DIAGNOSIS — K5900 Constipation, unspecified: Secondary | ICD-10-CM

## 2023-07-25 MED ORDER — FLUCONAZOLE 150 MG PO TABS: 150.0000 mg | ORAL_TABLET | Freq: Every day | ORAL | 0 refills | Status: DC

## 2023-07-25 MED ORDER — POLYETHYLENE GLYCOL 3350 17 GM/SCOOP PO POWD: 17.0000 g | Freq: Two times a day (BID) | ORAL | 1 refills | Status: DC | PRN

## 2023-07-25 NOTE — Patient Instructions (Addendum)
1. Yeast dermatitis  - fluconazole (DIFLUCAN) 150 MG tablet; Take 1 tablet (150 mg total) by mouth daily. Take first tablet today and second tablet 3 days later  Dispense: 2 tablet; Refill: 0  2. Constipation, unspecified constipation type  - polyethylene glycol powder (GLYCOLAX/MIRALAX) 17 GM/SCOOP powder; Take 17 g by mouth 2 (two) times daily as needed.  Dispense: 116 g; Refill: 1  Follow up:  Follow up in 3 months

## 2023-07-25 NOTE — Progress Notes (Signed)
Subjective   Patient ID: Misty Mccullough, female    DOB: 1971-01-14, 52 y.o.   MRN: [id number]  Chief Complaint  Patient presents with   Constipation   Vaginal Itching    Discharge as well     Referring provider: Ivonne Andrew, NP  Davena Sang is a 52 y.o. female with Past Medical History: No date: Bilateral lower extremity edema No date: CHF (congestive heart failure) (HCC) No date: COPD (chronic obstructive pulmonary disease) (HCC) No date: Cough No date: Diabetes mellitus without complication (HCC) No date: Gallstones No date: GERD (gastroesophageal reflux disease) No date: Heavy cigarette smoker 08/2019: Hemoglobin A1C between 7% and 9% indicating borderline  diabetic control (HCC) No date: Hypercholesteremia No date: Hyperglycemia No date: Hypertension No date: Shortness of breath No date: Stroke (cerebrum) (HCC) 12/2020: Vitamin D deficiency   HPI  Patient presents today for an acute visit.  She states that for the past 4 weeks she has been having issues with constipation.  She has been having a hard time going to the bathroom and straining.  She has had some small bowel movements.  She has noticed clear liquid drainage and does not know if this is coming from her rectum or vagina.  She is having itching and irritation to vaginal and rectal area.  We will order nu swab today.  Will order MiraLAX. Denies f/c/s, n/v/d, hemoptysis, PND, leg swelling Denies chest pain or edema   No Known Allergies  Immunization History  Administered Date(s) Administered   Influenza Whole 06/21/2011   Influenza, Seasonal, Injecte, Preservative Fre 06/20/2023   Influenza,inj,Quad PF,6+ Mos 09/06/2016, 08/17/2018, 08/25/2019, 06/29/2021, 11/15/2022   PFIZER Comirnaty(Gray Top)Covid-19 Tri-Sucrose Vaccine 01/15/2020, 02/09/2020   Pneumococcal Conjugate-13 08/25/2019   Pneumococcal Polysaccharide-23 12/06/2016   Pneumococcal-Unspecified 06/21/2011   Tdap 12/06/2016    Tobacco  History: Social History   Tobacco Use  Smoking Status Every Day   Current packs/day: 1.00   Average packs/day: 1 pack/day for 32.0 years (32.0 ttl pk-yrs)   Types: Cigarettes  Smokeless Tobacco Never  Tobacco Comments   Since age 7.   Ready to quit: Not Answered Counseling given: Not Answered Tobacco comments: Since age 68.   Outpatient Encounter Medications as of 07/25/2023  Medication Sig   albuterol (PROVENTIL) (2.5 MG/3ML) 0.083% nebulizer solution Take 3 mLs (2.5 mg total) by nebulization every 4 (four) hours as needed for wheezing or shortness of breath.   albuterol (VENTOLIN HFA) 108 (90 Base) MCG/ACT inhaler Inhale 2 puffs into the lungs every 6 (six) hours as needed for wheezing or shortness of breath.   aspirin EC 81 MG tablet Take 1 tablet (81 mg total) by mouth daily with breakfast. Swallow whole.   atorvastatin (LIPITOR) 80 MG tablet Take 1 tablet (80 mg total) by mouth daily.   blood glucose meter kit and supplies KIT Dispense based on patient and insurance preference. Use up to four times daily as directed.   Blood Pressure KIT 1 Units by Does not apply route daily.   carvedilol (COREG) 25 MG tablet Take 1 tablet (25 mg total) by mouth 2 (two) times daily with a meal.   cetirizine (ZYRTEC) 10 MG tablet Take 1 tablet (10 mg total) by mouth daily.   Continuous Glucose Sensor (FREESTYLE LIBRE 3 PLUS SENSOR) MISC 1 each by Does not apply route every 14 (fourteen) days. Change sensor every 15 days.   cyanocobalamin (VITAMIN B12) 1000 MCG tablet Take 1,000 mcg by mouth daily.   cyanocobalamin (VITAMIN B12)  1000 MCG/ML injection Inject 1 mL (1,000 mcg total) into the skin once a week. 1ml injection weeklyX4 then once a month indefinitely   dicyclomine (BENTYL) 10 MG capsule Take 1 capsule (10 mg total) by mouth 4 (four) times daily -  before meals and at bedtime.   empagliflozin (JARDIANCE) 10 MG TABS tablet Take 1 tablet (10 mg total) by mouth daily before breakfast.    ezetimibe (ZETIA) 10 MG tablet Take 1 tablet (10 mg total) by mouth daily.   fluconazole (DIFLUCAN) 150 MG tablet Take 1 tablet (150 mg total) by mouth daily. Take first tablet today and second tablet 3 days later   fluticasone-salmeterol (ADVAIR) 100-50 MCG/ACT AEPB Inhale 1 puff into the lungs 2 (two) times daily.   folic acid (FOLVITE) 800 MCG tablet Take 1 tablet by mouth once daily   furosemide (LASIX) 40 MG tablet Take 1 tablet (40 mg total) by mouth daily.   ibuprofen (ADVIL) 200 MG tablet Take 800 mg by mouth every 8 (eight) hours as needed (pain.).   insulin glargine (LANTUS SOLOSTAR) 100 UNIT/ML Solostar Pen Inject 16 Units into the skin daily.   lisinopril (ZESTRIL) 40 MG tablet Take 1 tablet (40 mg total) by mouth daily.   metFORMIN (GLUCOPHAGE-XR) 500 MG 24 hr tablet Take 1 tablet (500 mg total) by mouth 2 (two) times daily with a meal.   Nebulizers (COMPRESSOR/NEBULIZER) MISC 1 Units by Does not apply route as needed.   nicotine (NICODERM CQ - DOSED IN MG/24 HOURS) 21 mg/24hr patch Place 1 patch (21 mg total) onto the skin daily.   nitroGLYCERIN (NITROSTAT) 0.4 MG SL tablet Place 1 tablet (0.4 mg total) under the tongue every 5 (five) minutes x 3 doses as needed for chest pain (If no relief after 3rd dose proceed to ED or call 911).   omeprazole (PRILOSEC) 20 MG capsule Take 1 capsule (20 mg total) by mouth daily.   PAIN RELIEVING LIDOCAINE EX Apply 1 Application topically daily.   polyethylene glycol powder (GLYCOLAX/MIRALAX) 17 GM/SCOOP powder Take 17 g by mouth 2 (two) times daily as needed.   ranolazine (RANEXA) 500 MG 12 hr tablet Take 1 tablet (500 mg total) by mouth 2 (two) times daily.   Semaglutide,0.25 or 0.5MG /DOS, (OZEMPIC, 0.25 OR 0.5 MG/DOSE,) 2 MG/3ML SOPN Inject 0.5 mg into the skin once a week.   SV IRON 325 (65 Fe) MG tablet Take 1 tablet by mouth once daily   Tiotropium Bromide Monohydrate (SPIRIVA RESPIMAT) 2.5 MCG/ACT AERS Inhale 2 puffs into the lungs daily.    No facility-administered encounter medications on file as of 07/25/2023.    Review of Systems  Review of Systems  Constitutional: Negative.   HENT: Negative.    Cardiovascular: Negative.   Gastrointestinal:  Positive for constipation.  Genitourinary:  Positive for vaginal discharge.  Allergic/Immunologic: Negative.   Neurological: Negative.   Psychiatric/Behavioral: Negative.       Objective:   BP (!) 186/66 (BP Location: Right Arm, Patient Position: Sitting, Cuff Size: Normal)   Pulse 64   Wt 142 lb 9.6 oz (64.7 kg)   LMP  (LMP Unknown)   SpO2 100%   BMI 22.33 kg/m   Wt Readings from Last 5 Encounters:  07/25/23 142 lb 9.6 oz (64.7 kg)  06/20/23 138 lb (62.6 kg)  06/19/23 138 lb 6.4 oz (62.8 kg)  04/29/23 136 lb 12.8 oz (62.1 kg)  04/15/23 134 lb 12.8 oz (61.1 kg)     Physical Exam Vitals and nursing  note reviewed. Exam conducted with a chaperone present.  Constitutional:      General: She is not in acute distress.    Appearance: She is well-developed.  Cardiovascular:     Rate and Rhythm: Normal rate and regular rhythm.  Pulmonary:     Effort: Pulmonary effort is normal.     Breath sounds: Normal breath sounds.  Genitourinary:    General: Normal vulva.     Labia:        Right: Rash present.        Left: Rash present.      Vagina: Vaginal discharge present.     Rectum: No external hemorrhoid.  Musculoskeletal:     Cervical back: Tenderness present.  Neurological:     Mental Status: She is alert and oriented to person, place, and time.       Assessment & Plan:   Yeast dermatitis -     Fluconazole; Take 1 tablet (150 mg total) by mouth daily. Take first tablet today and second tablet 3 days later  Dispense: 2 tablet; Refill: 0  Constipation, unspecified constipation type -     Polyethylene Glycol 3350; Take 17 g by mouth 2 (two) times daily as needed.  Dispense: 116 g; Refill: 1     No follow-ups on file.   Ivonne Andrew,  NP 07/25/2023

## 2023-08-07 ENCOUNTER — Encounter: Payer: Self-pay | Admitting: Pharmacist

## 2023-08-07 ENCOUNTER — Other Ambulatory Visit: Payer: Medicaid Other | Admitting: Pharmacist

## 2023-08-07 DIAGNOSIS — I10 Essential (primary) hypertension: Secondary | ICD-10-CM | POA: Diagnosis not present

## 2023-08-07 DIAGNOSIS — I25118 Atherosclerotic heart disease of native coronary artery with other forms of angina pectoris: Secondary | ICD-10-CM

## 2023-08-07 DIAGNOSIS — E119 Type 2 diabetes mellitus without complications: Secondary | ICD-10-CM

## 2023-08-07 MED ORDER — FREESTYLE LIBRE 3 PLUS SENSOR MISC: 3 refills | Status: DC

## 2023-08-07 MED ORDER — LANTUS SOLOSTAR 100 UNIT/ML ~~LOC~~ SOPN: 10.0000 [IU] | PEN_INJECTOR | Freq: Every day | SUBCUTANEOUS | 1 refills | Status: AC

## 2023-08-07 MED ORDER — SEMAGLUTIDE (1 MG/DOSE) 4 MG/3ML ~~LOC~~ SOPN: 1.0000 mg | PEN_INJECTOR | SUBCUTANEOUS | 1 refills | Status: DC

## 2023-08-07 NOTE — Patient Instructions (Addendum)
Jimia,   I will follow up with you about getting a blood pressure machine.   I spoke with [facility] and they will work on refilling your ezetimibe, ranolazine (for chest pains), Spiriva, and carvedilol.  Increase Ozempic to 1 mg weekly when you finish the supply of Ozempic 0.5 mg weekly that you have. Decrease Lantus to 10 units at this time.   I will work with your insurance to get the [place] 3 Plus covered.   Thanks!  Catie Eppie Gibson, PharmD, BCACP, CPP Clinical Pharmacist [facility] [phone]

## 2023-08-07 NOTE — Progress Notes (Signed)
08/07/2023 Name: Misty Mccullough MRN: [id number] DOB: Jun 08, 1971  Chief Complaint  Patient presents with   Medication Management   Diabetes   Hypertension    Misty Mccullough is a 52 y.o. year old female who presented for a telephone visit.   They were referred to the pharmacist by their PCP for assistance in managing diabetes, hypertension, and hyperlipidemia.    Subjective:  Care Team: Primary Care Provider: Ivonne Andrew, NP ; Next Scheduled Visit: 09/22/23  Medication Access/Adherence  Current Pharmacy:  [facility] [address] [place] - [address] [place] [address] Phone: [phone] Fax: [phone]   Patient reports affordability concerns with their medications: No  Patient reports access/transportation concerns to their pharmacy: No  Patient reports adherence concerns with their medications:  No    Reports that she needs to pursue insurance in [place].   Reports she has a hard time communicating with [facility]. Through med review, realized she is due for refills on carvedilol, ezetimibe, ranolazine, and Spiriva.   Diabetes:  Current medications: Ozempic 0.5 mg weekly, metformin XR 500 mg 2 tablets daily, Lantus 18 units daily, Jardiance 10 mg daily  Current glucose readings: running 147-200s  Has not been able to pick up Libre 3 Plus. Appears to be a [employer] issue.    Hyperlipidemia/ASCVD Risk Reduction  Current lipid lowering medications: atorvastatin 80 mg daily   Antiplatelet regimen: aspirin 81 mg daily  Antianginal agent: ranolazine 500 mg twice daily- filled once in May but has not filled since then  Heart Failure:  Current medications:  ACEi/ARB/ARNI: lisinopril 40 mg daily SGLT2i: Jardiance 10 mg daily Beta blocker: carvedilol 25 mg twice daily - has run out Mineralocorticoid Receptor Antagonist: none Diuretic regimen: furosemide 40 mg daily - PRN  Current home blood pressure readings: does  not have a machine  Patient denies volume overload signs or symptoms including shortness of breath, lower extremity edema, increased use of pillows at night   Objective:  Lab Results  Component Value Date   HGBA1C 7.5 (A) 06/20/2023    Lab Results  Component Value Date   CREATININE 0.70 05/02/2023   BUN 28 (H) 03/04/2023   NA 138 03/04/2023   K 4.0 03/04/2023   CL 99 03/04/2023   CO2 24 03/04/2023    Lab Results  Component Value Date   CHOL 121 03/04/2023   HDL 37 (L) 03/04/2023   LDLCALC 67 03/04/2023   TRIG 86 03/04/2023   CHOLHDL 3.3 03/04/2023    Medications Reviewed Today     Reviewed by Alden Hipp, RPH-CPP (Pharmacist) on 08/07/23 at 1552  Med List Status: <None>   Medication Order Taking? Sig Documenting Provider Last Dose Status Informant  albuterol (PROVENTIL) (2.5 MG/3ML) 0.083% nebulizer solution [id number]  Take 3 mLs (2.5 mg total) by nebulization every 4 (four) hours as needed for wheezing or shortness of breath. Ivonne Andrew, NP  Active Self  albuterol (VENTOLIN HFA) 108 (90 Base) MCG/ACT inhaler [id number]  Inhale 2 puffs into the lungs every 6 (six) hours as needed for wheezing or shortness of breath. Ivonne Andrew, NP  Active Self  aspirin EC 81 MG tablet [id number] Yes Take 1 tablet (81 mg total) by mouth daily with breakfast. Swallow whole. Shon Hale, MD Taking Active Self  atorvastatin (LIPITOR) 80 MG tablet [id number] Yes Take 1 tablet (80 mg total) by mouth daily. Ivonne Andrew, NP Taking Active   blood glucose meter kit  and supplies KIT [id number] Yes Dispense based on patient and insurance preference. Use up to four times daily as directed. Orion Crook I, NP Taking Active Self  Blood Pressure KIT [id number] Yes 1 Units by Does not apply route daily. Ivonne Andrew, NP Taking Active   carvedilol (COREG) 25 MG tablet [id number] Yes Take 1 tablet (25 mg total) by mouth 2 (two) times daily with a meal. Mallipeddi, Vishnu P, MD  Taking Active   cetirizine (ZYRTEC) 10 MG tablet [id number]  Take 1 tablet (10 mg total) by mouth daily. Ivonne Andrew, NP  Active   Continuous Glucose Sensor (FREESTYLE LIBRE 3 PLUS SENSOR) [place] [id number]  Change sensor every 15 days. Ivonne Andrew, NP  Active   cyanocobalamin (VITAMIN B12) 1000 MCG tablet [id number]  Take 1,000 mcg by mouth daily. [provider]  Active Self  cyanocobalamin (VITAMIN B12) 1000 MCG/ML injection [id number]  Inject 1 mL (1,000 mcg total) into the skin once a week. 1ml injection weeklyX4 then once a month indefinitely Malachy Mood, MD  Active   dicyclomine (BENTYL) 10 MG capsule [id number]  Take 1 capsule (10 mg total) by mouth 4 (four) times daily -  before meals and at bedtime. Gelene Mink, NP  Active Self  empagliflozin (JARDIANCE) 10 MG TABS tablet [id number] Yes Take 1 tablet (10 mg total) by mouth daily before breakfast. Ivonne Andrew, NP Taking Active   ezetimibe (ZETIA) 10 MG tablet [id number] Yes Take 1 tablet (10 mg total) by mouth daily. Mallipeddi, Vishnu P, MD Taking Active   fluticasone-salmeterol (ADVAIR) 100-50 MCG/ACT AEPB [id number] Yes Inhale 1 puff into the lungs 2 (two) times daily. Ivonne Andrew, NP Taking Active   folic acid (FOLVITE) 800 MCG tablet [id number]  Take 1 tablet by mouth once daily Ivonne Andrew, NP  Active   furosemide (LASIX) 40 MG tablet [id number] Yes Take 1 tablet (40 mg total) by mouth daily. Mallipeddi, Orion Modest, MD Taking Active            Med Note Clearance Coots, Doug Bucklin T   Tue Jun 17, 2023  2:20 PM) PRN  ibuprofen (ADVIL) 200 MG tablet [id number]  Take 800 mg by mouth every 8 (eight) hours as needed (pain.). [provider]  Active Self  insulin glargine (LANTUS SOLOSTAR) 100 UNIT/ML Solostar Pen [id number] Yes Inject 16 Units into the skin daily. Ivonne Andrew, NP Taking Active   lisinopril (ZESTRIL) 40 MG tablet [id number] Yes Take 1 tablet (40 mg total) by mouth daily. Ivonne Andrew, NP Taking Active  Self  metFORMIN (GLUCOPHAGE-XR) 500 MG 24 hr tablet [id number] Yes Take 1 tablet (500 mg total) by mouth 2 (two) times daily with a meal. Corky Crafts, MD Taking Active            Med Note Clearance Coots, Alanda Slim Aug 07, 2023  3:41 PM)    Nebulizers (COMPRESSOR/NEBULIZER) MISC [id number]  1 Units by Does not apply route as needed. Shon Hale, MD  Active Self  nicotine (NICODERM CQ - DOSED IN MG/24 HOURS) 21 mg/24hr patch [id number]  Place 1 patch (21 mg total) onto the skin daily. Coralyn Helling, MD  Active   nitroGLYCERIN (NITROSTAT) 0.4 MG SL tablet [id number] No Place 1 tablet (0.4 mg total) under the tongue every 5 (five) minutes x 3 doses as needed for chest pain (If no relief after 3rd dose proceed to ED or call 911).  Patient not taking: Reported on 08/07/2023  Mallipeddi, Vishnu P, MD Not Taking Active   omeprazole (PRILOSEC) 20 MG capsule [id number]  Take 1 capsule (20 mg total) by mouth daily. Ivonne Andrew, NP  Active Self  PAIN [place] EX [id number]  Apply 1 Application topically daily. [provider]  Active Self  polyethylene glycol powder (GLYCOLAX/MIRALAX) 17 GM/SCOOP powder [id number]  Take 17 g by mouth 2 (two) times daily as needed. Ivonne Andrew, NP  Active   ranolazine (RANEXA) 500 MG 12 hr tablet [id number] Yes Take 1 tablet (500 mg total) by mouth 2 (two) times daily. Mallipeddi, Vishnu P, MD Taking Active   Semaglutide,0.25 or 0.5MG /DOS, (OZEMPIC, 0.25 OR 0.5 MG/DOSE,) 2 MG/3ML SOPN [id number] Yes Inject 0.5 mg into the skin once a week. Ivonne Andrew, NP Taking Active   SV IRON 325 (65 Fe) MG tablet [id number]  Take 1 tablet by mouth once daily Ivonne Andrew, NP  Active Self           Med Note Clearance Coots, Kendria Halberg T   Tue Jun 17, 2023  2:23 PM)    Tiotropium Bromide Monohydrate (SPIRIVA RESPIMAT) 2.5 MCG/ACT AERS [id number] Yes Inhale 2 puffs into the lungs daily. Ivonne Andrew, NP Taking Active              Assessment/Plan:    Contacted [facility] to facilitate refill requests, but notified patient that she needs to ask the pharmacy for refills moving forward.   Diabetes: - Currently uncontrolled but improved  - Reviewed goal A1c, goal fasting, and goal 2 hour post prandial glucose - Recommend to increase Ozempic to 1 mg weekly, reduce Lantus to 10 units daily. Continue metformin XR 1000 mg daily, Jardiance 10 mg daily. Will collaborate with PCP to place orders - Recommend to check glucose using Libre. Refill for [place] 3 sent to pharmacy. Contacted [facility], prior authorization required. Will complete PA with [employer].   Hyperlipidemia/ASCVD Risk Reduction: - Currently controlled.  - Recommend to continue current regimen at this time. Helped facilitate refills at pharmacy for ezetimibe and ranolazine  Heart Failure: - Currently  opportunity for optimization , given significant blood pressure elevations. Patient notes that [facility] was unable to mail cuff to her as she has [place] Medicaid and lives in [place]. Will outreach [facility] to confirm if that is accurate. Patient has historically reported improved blood pressure control when checked at home vs in office so would like to see home readings, when patient has all of her medications, to ascertain control prior to adding pharmacotherapy. Appears [facility] to [facility] was declined as patient did not answer. Will send this information in MyChart to patient and her daughter.     Follow Up Plan: phone call in 4 weeks  Catie TClearance Coots, PharmD, BCACP, CPP Clinical Pharmacist [facility] [phone]

## 2023-08-08 DIAGNOSIS — J42 Unspecified chronic bronchitis: Secondary | ICD-10-CM | POA: Diagnosis not present

## 2023-08-18 NOTE — Progress Notes (Unsigned)
08/19/2023 Name: Misty Mccullough MRN: [id number] DOB: 1971/03/18  Chief Complaint  Patient presents with   Hypertension   Diabetes    Misty Mccullough is a 52 y.o. year old female who presented for a telephone visit.   They were referred to the pharmacist by their PCP for assistance in managing diabetes, hypertension, and hyperlipidemia.   Subjective:  Care Team: Primary Care Provider: Ivonne Andrew, NP ; Next Scheduled Visit: 09/22/2023  Medication Access/Adherence  Current Pharmacy:  [facility] [address] [place] - [address] [place] [address] Phone: [phone] Fax: [phone]  [facility] [address] [place] [address] Phone: [phone] Fax: [phone]   Diabetes:  Current medications:  Ozempic 1 mg weekly, metformin XR 500 mg 2 tablets daily, Lantus 10 units daily, Jardiance 10 mg daily   Current glucose readings: 150-200 one after meals, 120s in morning without food  - Has not been able to pick up Libre 3 Plus. Appears to be a [employer] issue.   Patient reports hypoglycemic s/sx including dizziness and shakiness in morning at work. Eats candy to bring sugar. Patient denies hyperglycemic symptoms including polyuria, polydipsia, polyphagia, nocturia, neuropathy, blurred vision.  Current meal patterns: 2 meals/day - Breakfast: breakfast at [employer]  - Supper boil chicken - Snacks: chips - Drinks: diet [employer], water   Current physical activity: walks around at work a lot   Hypertension:  Current medications: lisinopril 40 mg daily, cervidil 25 mg twice daily  Medications previously tried:   Patient has a validated, automated, upper arm home BP cuff Current blood pressure readings readings: hasn't checked yet since she just received blood pressure cuff in the mail yesterday and needs batteries   Hyperlipidemia/ASCVD Risk Reduction  Current lipid lowering  medications: atorvastatin 80 mg daily    Antiplatelet regimen: aspirin 81 mg daily   Antianginal agent: ranolazine 500 mg twice daily- filled once in May but has not filled since then  ASCVD History: CVA, APD, CAD Family History: stroke (sister), heart attack (father), stroke (father), diabetes (mother) Risk Factors:   The ASCVD Risk score (Arnett DK, et al., 2019) failed to calculate for the following reasons:   The patient has a prior MI or stroke diagnosis   Heart Failure (EF 55-65%):  Current medications:  ACEi/ARB/ARNI: lisinopril 40 mg daily SGLT2i: Jardiance 10 mg daily Beta blocker: carvedilol 25 mg twice daily Mineralocorticoid Receptor Antagonist: none Diuretic regimen: furosemide 40 mg daily - PRN   Objective:  Lab Results  Component Value Date   HGBA1C 7.5 (A) 06/20/2023    Lab Results  Component Value Date   CREATININE 0.70 05/02/2023   BUN 28 (H) 03/04/2023   NA 138 03/04/2023   K 4.0 03/04/2023   CL 99 03/04/2023   CO2 24 03/04/2023    Lab Results  Component Value Date   CHOL 121 03/04/2023   HDL 37 (L) 03/04/2023   LDLCALC 67 03/04/2023   TRIG 86 03/04/2023   CHOLHDL 3.3 03/04/2023    Medications Reviewed Today     Reviewed by Roslyn Smiling, [facility] (Pharmacist) on 08/19/23 at 1519  Med List Status: <None>   Medication Order Taking? Sig Documenting Provider Last Dose Status Informant  albuterol (PROVENTIL) (2.5 MG/3ML) 0.083% nebulizer solution [id number] Yes Take 3 mLs (2.5 mg total) by nebulization every 4 (four) hours as needed for wheezing or shortness of breath. Ivonne Andrew, NP Taking Active Self  albuterol (VENTOLIN HFA) 108 (90 Base) MCG/ACT inhaler [id number]  Inhale 2 puffs into the lungs every 6 (six) hours as needed for wheezing or shortness of breath. Ivonne Andrew, NP  Active Self  aspirin EC 81 MG tablet [id number] Yes Take 1 tablet (81 mg total) by mouth daily with breakfast. Swallow whole. Shon Hale, MD Taking Active Self   Aspirin-Caffeine 845-65 MG PACK [id number] Yes Take 1 packet by mouth 2 (two) times daily. [provider] Taking Active            Med Note Cornelius Moras, Arietta Eisenstein   Tue Aug 19, 2023  3:19 PM) Taking OTC for headaches   atorvastatin (LIPITOR) 80 MG tablet [id number] Yes Take 1 tablet (80 mg total) by mouth daily. Ivonne Andrew, NP Taking Active   blood glucose meter kit and supplies KIT [id number]  Dispense based on patient and insurance preference. Use up to four times daily as directed. Orion Crook I, NP  Active Self  Blood Pressure KIT [id number]  1 Units by Does not apply route daily. Ivonne Andrew, NP  Active   carvedilol (COREG) 25 MG tablet [id number] Yes Take 1 tablet (25 mg total) by mouth 2 (two) times daily with a meal. Mallipeddi, Vishnu P, MD Taking Active   cetirizine (ZYRTEC) 10 MG tablet [id number] Yes Take 1 tablet (10 mg total) by mouth daily. Ivonne Andrew, NP Taking Active   Continuous Glucose Sensor (FREESTYLE LIBRE 3 PLUS SENSOR) MISC [id number]  Change sensor every 15 days. Ivonne Andrew, NP  Active   cyanocobalamin (VITAMIN B12) 1000 MCG tablet [id number] Yes Take 1,000 mcg by mouth daily. [provider] Taking Active Self  cyanocobalamin (VITAMIN B12) 1000 MCG/ML injection [id number] No Inject 1 mL (1,000 mcg total) into the skin once a week. 1ml injection weeklyX4 then once a month indefinitely  Patient not taking: Reported on 08/19/2023   Malachy Mood, MD Not Taking Active   dicyclomine (BENTYL) 10 MG capsule [id number] Yes Take 1 capsule (10 mg total) by mouth 4 (four) times daily -  before meals and at bedtime. Gelene Mink, NP Taking Active Self  empagliflozin (JARDIANCE) 10 MG TABS tablet [id number] Yes Take 1 tablet (10 mg total) by mouth daily before breakfast. Ivonne Andrew, NP Taking Active   ezetimibe (ZETIA) 10 MG tablet [id number] Yes Take 1 tablet (10 mg total) by mouth daily. Mallipeddi, Vishnu P, MD Taking Active   fluticasone-salmeterol  (ADVAIR) 100-50 MCG/ACT AEPB [id number] Yes Inhale 1 puff into the lungs 2 (two) times daily. Ivonne Andrew, NP Taking Active   folic acid (FOLVITE) 800 MCG tablet [id number] Yes Take 1 tablet by mouth once daily Ivonne Andrew, NP Taking Active   furosemide (LASIX) 40 MG tablet [id number] Yes Take 1 tablet (40 mg total) by mouth daily. Mallipeddi, Orion Modest, MD Taking Active            Med Note Clearance Coots, CATHERINE T   Tue Jun 17, 2023  2:20 PM) PRN  ibuprofen (ADVIL) 200 MG tablet [id number] Yes Take 800 mg by mouth every 8 (eight) hours as needed (pain.). [provider] Taking Active Self  insulin glargine (LANTUS SOLOSTAR) 100 UNIT/ML Solostar Pen [id number] Yes Inject 10 Units into the skin daily. Ivonne Andrew, NP Taking Active   lisinopril (ZESTRIL) 40 MG tablet [id number] Yes Take 1 tablet (40 mg total) by mouth daily. Ivonne Andrew, NP Taking Active Self  metFORMIN (GLUCOPHAGE-XR) 500 MG 24 hr tablet  [id number] Yes Take 1 tablet (500 mg total) by mouth 2 (two) times daily with a meal. Corky Crafts, MD Taking Active            Med Note Clearance Coots, Alanda Slim Aug 07, 2023  3:41 PM)    Nebulizers (COMPRESSOR/NEBULIZER) MISC [id number]  1 Units by Does not apply route as needed. Shon Hale, MD  Active Self  nicotine (NICODERM CQ - DOSED IN MG/24 HOURS) 21 mg/24hr patch [id number] Yes Place 1 patch (21 mg total) onto the skin daily. Coralyn Helling, MD Taking Active   nitroGLYCERIN (NITROSTAT) 0.4 MG SL tablet [id number] Yes Place 1 tablet (0.4 mg total) under the tongue every 5 (five) minutes x 3 doses as needed for chest pain (If no relief after 3rd dose proceed to ED or call 911). Mallipeddi, Vishnu P, MD Taking Active   omeprazole (PRILOSEC) 20 MG capsule [id number] Yes Take 1 capsule (20 mg total) by mouth daily. Ivonne Andrew, NP Taking Active Self  PAIN RELIEVING LIDOCAINE EX [id number]  Apply 1 Application topically daily. [provider]  Active Self   polyethylene glycol powder (GLYCOLAX/MIRALAX) 17 GM/SCOOP powder [id number] Yes Take 17 g by mouth 2 (two) times daily as needed. Ivonne Andrew, NP Taking Active   ranolazine (RANEXA) 500 MG 12 hr tablet [id number] Yes Take 1 tablet (500 mg total) by mouth 2 (two) times daily. Mallipeddi, Orion Modest, MD Taking Active   Semaglutide, 1 MG/DOSE, 4 MG/3ML SOPN [id number] Yes Inject 1 mg as directed once a week. Ivonne Andrew, NP Taking Active   SV IRON 325 (65 Fe) MG tablet [id number] No Take 1 tablet by mouth once daily  Patient not taking: Reported on 08/19/2023   Ivonne Andrew, NP Not Taking Active Self           Med Note Cornelius Moras, Lestine Rahe   Tue Aug 19, 2023  3:17 PM) Ran out of medication  Tiotropium Bromide Monohydrate (SPIRIVA RESPIMAT) 2.5 MCG/ACT AERS [id number] Yes Inhale 2 puffs into the lungs daily. Ivonne Andrew, NP Taking Active              Assessment/Plan:   Diabetes: - Currently uncontrolled but close to goal and improving. A1c goal <7 - Reviewed long term cardiovascular and renal outcomes of uncontrolled blood sugar - Reviewed goal A1c, goal fasting, and goal 2 hour post prandial glucose - Recommend to continue Ozempic 1 mg weekly, metformin XR 500 mg 2 tablets daily, Lantus 10 units daily, Jardiance 10 mg daily  - Recommend to check glucose with FreeStyle Libre 3 once patient receives it. Plan to mail to patient while waiting on [facility] coverage  Hypertension: - Currently uncontrolled based on most recent office readings - Patient reported receiving blood pressure cuff mailed to her yesterday - Reviewed appropriate blood pressure monitoring technique and reviewed goal blood pressure. Recommended to check home blood pressure and heart rate periodically once she receives batteries - Recommend to continue lisinopril 40 mg daily, carvedilol 25 mg twice daily   Hyperlipidemia/ASCVD Risk Reduction: - Currently controlled.  - Recommend to continue current  regimen  Bowel Movements  - Reports increased liquid bowel movements that has worsened recently. Patient completed a course of fluconazole for a possible yeast infection that did help with itching back in October. Also, received Miralax to aid in constipation. Patient reports that Miralax did not help her constipation. She reports taking daily for two weeks - Will follow-up with PCP to discuss  diagnosis and treatment  Follow Up Plan: 09/02/2023 with pharmacist via telephone   Roslyn Smiling, PharmD PGY1 Pharmacy Resident 08/19/2023 3:49 PM   I have reviewed the pharmacist's encounter and agree with their documentation.   Catie Eppie Gibson, PharmD, BCACP, CPP [facility] [phone]

## 2023-08-19 ENCOUNTER — Other Ambulatory Visit (HOSPITAL_COMMUNITY): Payer: Self-pay

## 2023-08-19 ENCOUNTER — Other Ambulatory Visit: Payer: Self-pay

## 2023-08-19 ENCOUNTER — Encounter: Payer: Self-pay | Admitting: Nurse Practitioner

## 2023-08-19 ENCOUNTER — Other Ambulatory Visit: Payer: Medicaid Other | Admitting: Pharmacist

## 2023-08-19 DIAGNOSIS — I739 Peripheral vascular disease, unspecified: Secondary | ICD-10-CM

## 2023-08-19 DIAGNOSIS — E119 Type 2 diabetes mellitus without complications: Secondary | ICD-10-CM

## 2023-08-19 DIAGNOSIS — I25118 Atherosclerotic heart disease of native coronary artery with other forms of angina pectoris: Secondary | ICD-10-CM

## 2023-08-19 DIAGNOSIS — I10 Essential (primary) hypertension: Secondary | ICD-10-CM

## 2023-08-19 MED ORDER — FREESTYLE LIBRE 3 SENSOR MISC
1 refills | Status: DC
  Filled 2023-08-19: qty 2, 28d supply, fill #0

## 2023-08-20 ENCOUNTER — Other Ambulatory Visit: Payer: Self-pay

## 2023-08-21 ENCOUNTER — Other Ambulatory Visit: Payer: Self-pay

## 2023-08-22 ENCOUNTER — Other Ambulatory Visit: Payer: Self-pay

## 2023-08-25 ENCOUNTER — Ambulatory Visit: Payer: Self-pay | Admitting: Obstetrics and Gynecology

## 2023-08-28 ENCOUNTER — Other Ambulatory Visit: Payer: Self-pay | Admitting: Obstetrics and Gynecology

## 2023-08-28 NOTE — Patient Instructions (Signed)
Hi Ms. Scheerer, I am sorry I missed you today, I hope you are doing okay - as a part of your Medicaid benefit, you are eligible for care management and care coordination services at no cost or copay. I was unable to reach you by phone today but would be happy to help you with your health related needs. Please feel free to call me at [phone].  A member of the Managed Medicaid care management team will reach out to you again over the next 30 business days.   Kathi Der RN, BSN [place]  [employer] - Managed Medicaid High Risk [phone]

## 2023-08-28 NOTE — Patient Outreach (Signed)
  Medicaid Managed Care   Unsuccessful Attempt Note   08/28/2023 Name: Misty Mccullough MRN: [id number] DOB: [birth year]  Referred by: Ivonne Andrew, NP Reason for referral : High Risk Managed Medicaid (Unsuccessful telephone outreach)  An unsuccessful telephone outreach was attempted today. The patient was referred to the case management team for assistance with care management and care coordination.    Follow Up Plan: The Managed Medicaid care management team will reach out to the patient again over the next 30 business  days. and The  Patient has been provided with contact information for the Managed Medicaid care management team and has been advised to call with any health related questions or concerns.   Kathi Der RN, BSN [place]  [employer] - Managed Medicaid High Risk [phone]

## 2023-09-01 NOTE — Progress Notes (Unsigned)
09/01/2023 Name: Misty Mccullough MRN: [id number] DOB: [birth year]  No chief complaint on file.   Misty Mccullough is a 52 y.o. year old female who presented for a telephone visit.   They were referred to the pharmacist by their PCP for assistance in managing diabetes, hypertension, and hyperlipidemia.    Subjective:  Care Team: Primary Care Provider: Ivonne Andrew, NP ; Next Scheduled Visit: 09/22/2023   Medication Access/Adherence  Current Pharmacy:  [facility] [address] [place] - [address] [place] [address] Phone: [phone] Fax: [phone]  [facility] [address] [place] [address] Phone: [phone] Fax: [phone]   Patient reports affordability concerns with their medications: {YES/NO:21197} Patient reports access/transportation concerns to their pharmacy: {YES/NO:21197} Patient reports adherence concerns with their medications:  {YES/NO:21197} ***   Diabetes:  Current medications:  Medications tried in the past:   Current glucose readings: *** Using *** meter; testing *** times daily  Date of Download: *** % Time CGM is active: ***% Average Glucose: *** mg/dL Glucose Management Indicator: ***  Glucose Variability: *** (goal <36%) Time in Goal:  - Time in range 70-180: ***% - Time above range: ***% - Time below range: ***% Observed patterns:  Patient {Actions; denies-reports:120008} hypoglycemic s/sx including ***dizziness, shakiness, sweating. Patient {Actions; denies-reports:120008} hyperglycemic symptoms including ***polyuria, polydipsia, polyphagia, nocturia, neuropathy, blurred vision.  Current meal patterns:  - Breakfast: *** - Lunch *** - Supper *** - Snacks *** - Drinks ***  Current physical activity: ***  Current medication access support: *** Hypertension:  Current medications: *** Medications previously tried:   Patient {HAS/DOES NOT QIHK:74259} a  validated, automated, upper arm home BP cuff Current blood pressure readings readings: ***  Patient {Actions; denies-reports:120008} hypotensive s/sx including ***dizziness, lightheadedness.  Patient {Actions; denies-reports:120008} hypertensive symptoms including ***headache, chest pain, shortness of breath  Current meal patterns: ***  Current physical activity: ***   Hyperlipidemia/ASCVD Risk Reduction  Current lipid lowering medications:  Medications tried in the past:   Antiplatelet regimen:   ASCVD History:  Family History:  Risk Factors:   Current physical activity: ***  Current medication access support: ***  The ASCVD Risk score (Arnett DK, et al., 2019) failed to calculate for the following reasons:   The patient has a prior MI or stroke diagnosis    Objective:  Lab Results  Component Value Date   HGBA1C 7.5 (A) 06/20/2023    Lab Results  Component Value Date   CREATININE 0.70 05/02/2023   BUN 28 (H) 03/04/2023   NA 138 03/04/2023   K 4.0 03/04/2023   CL 99 03/04/2023   CO2 24 03/04/2023    Lab Results  Component Value Date   CHOL 121 03/04/2023   HDL 37 (L) 03/04/2023   LDLCALC 67 03/04/2023   TRIG 86 03/04/2023   CHOLHDL 3.3 03/04/2023    Medications Reviewed Today   Medications were not reviewed in this encounter     Assessment/Plan:   Diabetes: - Currently {CHL Controlled/Uncontrolled:[phone]} - Reviewed long term cardiovascular and renal outcomes of uncontrolled blood sugar - Reviewed goal A1c, goal fasting, and goal 2 hour post prandial glucose - Reviewed dietary modifications including *** - Reviewed lifestyle modifications including: - Recommend to ***  - Patient denies personal or family history of multiple endocrine neoplasia type 2, medullary thyroid cancer; personal history of pancreatitis or gallbladder disease. - Recommend to check glucose *** - Meets financial criteria for *** patient  assistance program through ***.  Will collaborate with provider, CPhT, and patient to pursue assistance.   Hypertension: - Currently {CHL Controlled/[phone]} - Reviewed long term cardiovascular and renal outcomes of uncontrolled blood pressure - Reviewed appropriate blood pressure monitoring technique and reviewed goal blood pressure. Recommended to check home blood pressure and heart rate *** - Recommend to ***   Hyperlipidemia/ASCVD Risk Reduction: - Currently {CHL Controlled/[phone]}.  - Reviewed long term complications of uncontrolled cholesterol - Reviewed dietary recommendations including *** - Reviewed lifestyle recommendations including *** - Recommend to ***  - Meets financial criteria for *** patient assistance program through ***. Will collaborate with provider, CPhT, and patient to pursue assistance.    Follow Up Plan: ***  Roslyn Smiling, PharmD PGY1 Pharmacy Resident 09/01/2023 8:58 PM

## 2023-09-02 ENCOUNTER — Encounter: Payer: Self-pay | Admitting: Pharmacist

## 2023-09-02 ENCOUNTER — Other Ambulatory Visit (INDEPENDENT_AMBULATORY_CARE_PROVIDER_SITE_OTHER): Payer: Medicaid Other | Admitting: Pharmacist

## 2023-09-02 DIAGNOSIS — I10 Essential (primary) hypertension: Secondary | ICD-10-CM

## 2023-09-09 ENCOUNTER — Other Ambulatory Visit: Payer: Self-pay | Admitting: Obstetrics and Gynecology

## 2023-09-09 NOTE — Patient Instructions (Signed)
Hi Misty Mccullough, I hope you are doing okay and I am sorry I wasn't able to reach you today - as a part of your Medicaid benefit, you are eligible for care management and care coordination services at no cost or copay. I was unable to reach you by phone today but would be happy to help you with your health related needs. Please feel free to call me at [phone].  A member of the Managed Medicaid care management team will reach out to you again over the next 30 business  days.   Kathi Der RN, BSN [place]  [employer] - Managed Medicaid High Risk [phone]

## 2023-09-09 NOTE — Patient Outreach (Signed)
  Medicaid Managed Care   Unsuccessful Attempt Note   09/09/2023 Name: Misty Mccullough MRN: [id number] DOB: [birth year]  Referred by: Ivonne Andrew, NP Reason for referral : High Risk Managed Medicaid (Voice mail full, unable to leave a message)  A second unsuccessful telephone outreach was attempted today. The patient was referred to the case management team for assistance with care management and care coordination.    Follow Up Plan: The Managed Medicaid care management team will reach out to the patient again over the next 30 business  days. and The  Patient has been provided with contact information for the Managed Medicaid care management team and has been advised to call with any health related questions or concerns.   Kathi Der RN, BSN [place]  [employer] - Managed Medicaid High Risk [phone]

## 2023-09-10 ENCOUNTER — Inpatient Hospital Stay: Payer: Medicaid Other | Admitting: Hematology

## 2023-09-10 ENCOUNTER — Inpatient Hospital Stay: Payer: Medicaid Other

## 2023-09-12 ENCOUNTER — Telehealth: Payer: Self-pay

## 2023-09-12 NOTE — Telephone Encounter (Signed)
..   Medicaid Managed Care   Unsuccessful Outreach Note  09/12/2023 Name: Misty Mccullough MRN: [id number] DOB: 04-07-1971  Referred by: Ivonne Andrew, NP Reason for referral : Appointment (I called the patient today to get her missed phone appt with the MM RNCM rescheduled. Her VM was full.)   A second unsuccessful telephone outreach was attempted today. The patient was referred to the case management team for assistance with care management and care coordination.   Follow Up Plan: The care management team will reach out to the patient again over the next 7 days.   Weston Settle Care Guide  [facility] Managed  Care Guide [facility]  [phone]

## 2023-09-19 ENCOUNTER — Telehealth: Payer: Self-pay

## 2023-09-19 NOTE — Telephone Encounter (Signed)
..   Medicaid Managed Care   Unsuccessful Outreach Note  09/19/2023 Name: Misty Mccullough MRN: [id number] DOB: 1971/10/01  Referred by: Ivonne Andrew, NP Reason for referral : Appointment   Third unsuccessful telephone outreach was attempted today. The patient was referred to the case management team for assistance with care management and care coordination. The patient's primary care provider has been notified of our unsuccessful attempts to make or maintain contact with the patient. The care management team is pleased to engage with this patient at any time in the future should he/she be interested in assistance from the care management team.   Follow Up Plan: We have been unable to make contact with the patient for follow up. The care management team is available to follow up with the patient after provider conversation with the patient regarding recommendation for care management engagement and subsequent re-referral to the care management team.   Weston Settle Care Guide  [facility] Managed  Care Guide [facility]  [phone]

## 2023-09-22 ENCOUNTER — Ambulatory Visit (INDEPENDENT_AMBULATORY_CARE_PROVIDER_SITE_OTHER): Payer: Medicaid Other | Admitting: Nurse Practitioner

## 2023-09-22 ENCOUNTER — Encounter: Payer: Self-pay | Admitting: Nurse Practitioner

## 2023-09-22 VITALS — BP 145/64 | HR 68 | Temp 97.0°F | Wt 137.4 lb

## 2023-09-22 DIAGNOSIS — E118 Type 2 diabetes mellitus with unspecified complications: Secondary | ICD-10-CM | POA: Diagnosis not present

## 2023-09-22 DIAGNOSIS — D649 Anemia, unspecified: Secondary | ICD-10-CM | POA: Diagnosis not present

## 2023-09-22 DIAGNOSIS — Z760 Encounter for issue of repeat prescription: Secondary | ICD-10-CM | POA: Diagnosis not present

## 2023-09-22 DIAGNOSIS — I10 Essential (primary) hypertension: Secondary | ICD-10-CM

## 2023-09-22 DIAGNOSIS — K219 Gastro-esophageal reflux disease without esophagitis: Secondary | ICD-10-CM

## 2023-09-22 LAB — POCT GLYCOSYLATED HEMOGLOBIN (HGB A1C): Hemoglobin A1C: 9.9 % — AB (ref 4.0–5.6)

## 2023-09-22 MED ORDER — LISINOPRIL 40 MG PO TABS: 40.0000 mg | ORAL_TABLET | Freq: Every day | ORAL | 1 refills | Status: DC

## 2023-09-22 MED ORDER — OMEPRAZOLE 20 MG PO CPDR: 20.0000 mg | DELAYED_RELEASE_CAPSULE | Freq: Every day | ORAL | 1 refills | Status: AC

## 2023-09-22 MED ORDER — ALBUTEROL SULFATE (2.5 MG/3ML) 0.083% IN NEBU: 2.5000 mg | INHALATION_SOLUTION | RESPIRATORY_TRACT | 2 refills | Status: DC | PRN

## 2023-09-22 MED ORDER — FERROUS SULFATE 325 (65 FE) MG PO TABS: 325.0000 mg | ORAL_TABLET | Freq: Every day | ORAL | 0 refills | Status: AC

## 2023-09-22 NOTE — Progress Notes (Signed)
Subjective   Patient ID: Misty Mccullough, female    DOB: May 13, 1971, 52 y.o.   MRN: [id number]  Chief Complaint  Patient presents with   Diabetes    Referring provider: Ivonne Andrew, NP  Misty Mccullough is a 52 y.o. female with Past Medical History: No date: Bilateral lower extremity edema No date: CHF (congestive heart failure) (HCC) No date: COPD (chronic obstructive pulmonary disease) (HCC) No date: Cough No date: Diabetes mellitus without complication (HCC) No date: Gallstones No date: GERD (gastroesophageal reflux disease) No date: Heavy cigarette smoker 08/2019: Hemoglobin A1C between 7% and 9% indicating borderline  diabetic control (HCC) No date: Hypercholesteremia No date: Hyperglycemia No date: Hypertension No date: Shortness of breath No date: Stroke (cerebrum) (HCC) 12/2020: Vitamin D deficiency   HPI  Patient presents today for follow-up visit.  She did recently have blood work through hematology.  She has been started on vitamin B12 injections.  Patient has followed with cardiology and did have a heart cath.  Pharmacy will follow with her to adjust her other diabetic medications.  A1c in office today is 7.5 which has improved from 9.9 at last visit.  Patient states that she is having seasonal allergies.  We will start her on Zyrtec.  Denies f/c/s, n/v/d, hemoptysis, PND, leg swelling. Denies chest pain or edema.   Note: BP was elevated - patient did not take blood pressure meds this morning    No Known Allergies  Immunization History  Administered Date(s) Administered   Influenza Whole 06/21/2011   Influenza, Seasonal, Injecte, Preservative Fre 06/20/2023   Influenza,inj,Quad PF,6+ Mos 09/06/2016, 08/17/2018, 08/25/2019, 06/29/2021, 11/15/2022   PFIZER Comirnaty(Gray Top)Covid-19 Tri-Sucrose Vaccine 01/15/2020, 02/09/2020   Pneumococcal Conjugate-13 08/25/2019   Pneumococcal Polysaccharide-23 12/06/2016   Pneumococcal-Unspecified 06/21/2011   Tdap  12/06/2016    Tobacco History: Social History   Tobacco Use  Smoking Status Every Day   Current packs/day: 1.00   Average packs/day: 1 pack/day for 32.0 years (32.0 ttl pk-yrs)   Types: Cigarettes  Smokeless Tobacco Never  Tobacco Comments   Since age 24.   Ready to quit: Not Answered Counseling given: Not Answered Tobacco comments: Since age 3.   Outpatient Encounter Medications as of 09/22/2023  Medication Sig   albuterol (VENTOLIN HFA) 108 (90 Base) MCG/ACT inhaler Inhale 2 puffs into the lungs every 6 (six) hours as needed for wheezing or shortness of breath.   aspirin EC 81 MG tablet Take 1 tablet (81 mg total) by mouth daily with breakfast. Swallow whole.   atorvastatin (LIPITOR) 80 MG tablet Take 1 tablet (80 mg total) by mouth daily.   blood glucose meter kit and supplies KIT Dispense based on patient and insurance preference. Use up to four times daily as directed.   Blood Pressure KIT 1 Units by Does not apply route daily.   carvedilol (COREG) 25 MG tablet Take 1 tablet (25 mg total) by mouth 2 (two) times daily with a meal.   cetirizine (ZYRTEC) 10 MG tablet Take 1 tablet (10 mg total) by mouth daily.   Continuous Glucose Sensor (FREESTYLE LIBRE 3 PLUS SENSOR) MISC Change sensor every 15 days.   Continuous Glucose Sensor (FREESTYLE LIBRE 3 SENSOR) MISC Place 1 sensor on the skin every 14 days. Use to check glucose continuously   cyanocobalamin (VITAMIN B12) 1000 MCG tablet Take 1,000 mcg by mouth daily.   dicyclomine (BENTYL) 10 MG capsule Take 1 capsule (10 mg total) by mouth 4 (four) times daily -  before meals  and at bedtime.   empagliflozin (JARDIANCE) 10 MG TABS tablet Take 1 tablet (10 mg total) by mouth daily before breakfast.   ezetimibe (ZETIA) 10 MG tablet Take 1 tablet (10 mg total) by mouth daily.   fluticasone-salmeterol (ADVAIR) 100-50 MCG/ACT AEPB Inhale 1 puff into the lungs 2 (two) times daily.   folic acid (FOLVITE) 800 MCG tablet Take 1 tablet by  mouth once daily   furosemide (LASIX) 40 MG tablet Take 1 tablet (40 mg total) by mouth daily.   insulin glargine (LANTUS SOLOSTAR) 100 UNIT/ML Solostar Pen Inject 10 Units into the skin daily.   metFORMIN (GLUCOPHAGE-XR) 500 MG 24 hr tablet Take 1 tablet (500 mg total) by mouth 2 (two) times daily with a meal.   Nebulizers (COMPRESSOR/NEBULIZER) MISC 1 Units by Does not apply route as needed.   nicotine (NICODERM CQ - DOSED IN MG/24 HOURS) 21 mg/24hr patch Place 1 patch (21 mg total) onto the skin daily.   nitroGLYCERIN (NITROSTAT) 0.4 MG SL tablet Place 1 tablet (0.4 mg total) under the tongue every 5 (five) minutes x 3 doses as needed for chest pain (If no relief after 3rd dose proceed to ED or call 911).   PAIN RELIEVING LIDOCAINE EX Apply 1 Application topically daily.   ranolazine (RANEXA) 500 MG 12 hr tablet Take 1 tablet (500 mg total) by mouth 2 (two) times daily.   Semaglutide, 1 MG/DOSE, 4 MG/3ML SOPN Inject 1 mg as directed once a week.   Tiotropium Bromide Monohydrate (SPIRIVA RESPIMAT) 2.5 MCG/ACT AERS Inhale 2 puffs into the lungs daily.   [DISCONTINUED] albuterol (PROVENTIL) (2.5 MG/3ML) 0.083% nebulizer solution Take 3 mLs (2.5 mg total) by nebulization every 4 (four) hours as needed for wheezing or shortness of breath.   [DISCONTINUED] lisinopril (ZESTRIL) 40 MG tablet Take 1 tablet (40 mg total) by mouth daily.   [DISCONTINUED] omeprazole (PRILOSEC) 20 MG capsule Take 1 capsule (20 mg total) by mouth daily.   [DISCONTINUED] SV IRON 325 (65 Fe) MG tablet Take 1 tablet by mouth once daily   albuterol (PROVENTIL) (2.5 MG/3ML) 0.083% nebulizer solution Take 3 mLs (2.5 mg total) by nebulization every 4 (four) hours as needed for wheezing or shortness of breath.   Aspirin-Caffeine 845-65 MG PACK Take 1 packet by mouth 2 (two) times daily.   cyanocobalamin (VITAMIN B12) 1000 MCG/ML injection Inject 1 mL (1,000 mcg total) into the skin once a week. 1ml injection weeklyX4 then once a month  indefinitely (Patient not taking: Reported on 08/19/2023)   ferrous sulfate (SV IRON) 325 (65 FE) MG tablet Take 1 tablet (325 mg total) by mouth daily.   ibuprofen (ADVIL) 200 MG tablet Take 800 mg by mouth every 8 (eight) hours as needed (pain.). (Patient not taking: Reported on 09/22/2023)   lisinopril (ZESTRIL) 40 MG tablet Take 1 tablet (40 mg total) by mouth daily.   omeprazole (PRILOSEC) 20 MG capsule Take 1 capsule (20 mg total) by mouth daily.   polyethylene glycol powder (GLYCOLAX/MIRALAX) 17 GM/SCOOP powder Take 17 g by mouth 2 (two) times daily as needed. (Patient not taking: Reported on 09/22/2023)   No facility-administered encounter medications on file as of 09/22/2023.    Review of Systems  Review of Systems  Constitutional: Negative.   HENT: Negative.    Cardiovascular: Negative.   Gastrointestinal: Negative.   Allergic/Immunologic: Negative.   Neurological: Negative.   Psychiatric/Behavioral: Negative.       Objective:   BP (!) 150/60   Pulse 68  Temp (!) 97 F (36.1 C)   Wt 137 lb 6.4 oz (62.3 kg)   LMP  (LMP Unknown)   SpO2 99%   BMI 21.52 kg/m   Wt Readings from Last 5 Encounters:  09/22/23 137 lb 6.4 oz (62.3 kg)  07/25/23 142 lb 9.6 oz (64.7 kg)  06/20/23 138 lb (62.6 kg)  06/19/23 138 lb 6.4 oz (62.8 kg)  04/29/23 136 lb 12.8 oz (62.1 kg)     Physical Exam Vitals and nursing note reviewed.  Constitutional:      General: She is not in acute distress.    Appearance: She is well-developed.  Cardiovascular:     Rate and Rhythm: Normal rate and regular rhythm.  Pulmonary:     Effort: Pulmonary effort is normal.     Breath sounds: Normal breath sounds.  Neurological:     Mental Status: She is alert and oriented to person, place, and time.       Assessment & Plan:   Diabetes mellitus with complication (HCC) -     POCT glycosylated hemoglobin (Hb A1C)  Medication refill -     Lisinopril; Take 1 tablet (40 mg total) by mouth daily.   Dispense: 90 tablet; Refill: 1 -     Omeprazole; Take 1 capsule (20 mg total) by mouth daily.  Dispense: 90 capsule; Refill: 1  Essential hypertension -     Lisinopril; Take 1 tablet (40 mg total) by mouth daily.  Dispense: 90 tablet; Refill: 1  Anemia, unspecified type -     Ferrous Sulfate; Take 1 tablet (325 mg total) by mouth daily.  Dispense: 90 tablet; Refill: 0  Gastroesophageal reflux disease without esophagitis -     Omeprazole; Take 1 capsule (20 mg total) by mouth daily.  Dispense: 90 capsule; Refill: 1  Other orders -     Albuterol Sulfate; Take 3 mLs (2.5 mg total) by nebulization every 4 (four) hours as needed for wheezing or shortness of breath.  Dispense: 120 mL; Refill: 2     Return in about 3 months (around 12/21/2023).   Ivonne Andrew, NP 09/22/2023

## 2023-09-22 NOTE — Patient Instructions (Signed)
1. Medication refill  - lisinopril (ZESTRIL) 40 MG tablet; Take 1 tablet (40 mg total) by mouth daily.  Dispense: 90 tablet; Refill: 1 - omeprazole (PRILOSEC) 20 MG capsule; Take 1 capsule (20 mg total) by mouth daily.  Dispense: 90 capsule; Refill: 1  2. Essential hypertension  - lisinopril (ZESTRIL) 40 MG tablet; Take 1 tablet (40 mg total) by mouth daily.  Dispense: 90 tablet; Refill: 1  3. Diabetes mellitus with complication (HCC) (Primary)  - POCT glycosylated hemoglobin (Hb A1C)  4. Anemia, unspecified type  - ferrous sulfate (SV IRON) 325 (65 FE) MG tablet; Take 1 tablet (325 mg total) by mouth daily.  Dispense: 90 tablet; Refill: 0  5. Gastroesophageal reflux disease without esophagitis  - omeprazole (PRILOSEC) 20 MG capsule; Take 1 capsule (20 mg total) by mouth daily.  Dispense: 90 capsule; Refill: 1  Follow up:  Follow up in 3 months

## 2023-09-30 ENCOUNTER — Other Ambulatory Visit: Payer: Self-pay | Admitting: Obstetrics and Gynecology

## 2023-09-30 NOTE — Patient Outreach (Cosign Needed)
Care Coordination  09/30/2023  [name] July 01, 1971 [id number]   Medicaid Managed Care   Unsuccessful Outreach Note  09/30/2023 Name: [name] MRN: [id number] DOB: 02-May-1971  Referred by: Ivonne Andrew, NP Reason for referral : High Risk Managed Medicaid (documenting 4 unsuccessful attempts to reach patient)  Fourth  unsuccessful telephone outreach was attempted. The patient was referred to the case management team for assistance with care management and care coordination. The patient's primary care provider has been notified of our unsuccessful attempts to make or maintain contact with the patient. The care management team is pleased to engage with this patient at any time in the future should he/she be interested in assistance from the care management team.   Follow Up Plan: The patient has been provided with contact information for the care management team and has been advised to call with any health related questions or concerns.  We have been unable to make contact with the patient for follow up. The care management team is available to follow up with the patient after provider conversation with the patient regarding recommendation for care management engagement and subsequent re-referral to the care management team.   Kathi Der RN, BSN [place]  [facility] Care Management Coordinator - Managed [place] High Risk [phone]

## 2023-09-30 NOTE — Patient Instructions (Signed)
Hi Ms. Kobold, I am sorry we have been unable to reach you-I hope you are okay as a part of your Medicaid benefit, you are eligible for care management and care coordination services at no cost or copay. I was unable to reach you by phone today but would be happy to help you with your health related needs. Please feel free to call me at [phone].   Kathi Der RN, BSN [place]  [employer] - Managed Medicaid High Risk [phone]

## 2023-10-16 ENCOUNTER — Telehealth: Payer: Medicaid Other | Admitting: Physician Assistant

## 2023-10-16 ENCOUNTER — Telehealth: Payer: Self-pay | Admitting: Pharmacist

## 2023-10-16 ENCOUNTER — Other Ambulatory Visit: Payer: Self-pay

## 2023-10-16 DIAGNOSIS — B9689 Other specified bacterial agents as the cause of diseases classified elsewhere: Secondary | ICD-10-CM | POA: Diagnosis not present

## 2023-10-16 DIAGNOSIS — J019 Acute sinusitis, unspecified: Secondary | ICD-10-CM | POA: Diagnosis not present

## 2023-10-16 MED ORDER — DOXYCYCLINE HYCLATE 100 MG PO TABS: 100.0000 mg | ORAL_TABLET | Freq: Two times a day (BID) | ORAL | 0 refills | Status: DC

## 2023-10-16 NOTE — Progress Notes (Signed)
 I have spent 5 minutes in review of e-visit questionnaire, review and updating patient chart, medical decision making and response to patient.   Piedad Climes, PA-C

## 2023-10-16 NOTE — Progress Notes (Signed)

## 2023-10-16 NOTE — Progress Notes (Signed)
Attempted to contact patient for scheduled appointment for medication management. Left HIPAA compliant message for patient to return my call at their convenience.   Catie T. Harper, PharmD, BCACP, CPP Clinical Pharmacist [facility] [phone]  

## 2023-11-20 ENCOUNTER — Encounter: Payer: Self-pay | Admitting: Gastroenterology

## 2023-11-20 ENCOUNTER — Encounter: Payer: Self-pay | Admitting: Nurse Practitioner

## 2023-11-20 ENCOUNTER — Ambulatory Visit: Payer: Medicaid Other | Admitting: Gastroenterology

## 2023-11-20 VITALS — BP 176/67 | HR 71 | Temp 98.4°F | Ht 64.0 in | Wt 148.6 lb

## 2023-11-20 DIAGNOSIS — K760 Fatty (change of) liver, not elsewhere classified: Secondary | ICD-10-CM

## 2023-11-20 DIAGNOSIS — R161 Splenomegaly, not elsewhere classified: Secondary | ICD-10-CM | POA: Diagnosis not present

## 2023-11-20 DIAGNOSIS — R159 Full incontinence of feces: Secondary | ICD-10-CM | POA: Diagnosis not present

## 2023-11-20 DIAGNOSIS — N899 Noninflammatory disorder of vagina, unspecified: Secondary | ICD-10-CM | POA: Diagnosis not present

## 2023-11-20 DIAGNOSIS — K6289 Other specified diseases of anus and rectum: Secondary | ICD-10-CM

## 2023-11-20 DIAGNOSIS — R197 Diarrhea, unspecified: Secondary | ICD-10-CM | POA: Diagnosis not present

## 2023-11-20 MED ORDER — HYDROCORTISONE (PERIANAL) 2.5 % EX CREA: 1.0000 | TOPICAL_CREAM | Freq: Two times a day (BID) | CUTANEOUS | 1 refills | Status: AC

## 2023-11-20 MED ORDER — DICYCLOMINE HCL 10 MG PO CAPS: 10.0000 mg | ORAL_CAPSULE | Freq: Three times a day (TID) | ORAL | 3 refills | Status: AC

## 2023-11-20 NOTE — Patient Instructions (Addendum)
Please have blood work and stool tests done.  I have ordered a CT of your abdomen and pelvis.  We may need to refer you to GYN or possibly do a flex sig. We will have to see what the imaging shows.  You can resume dicyclomine up to 4 times a day before meals to help with looser stool. This can cause constipation, dry mouth dizziness, so start with just twice a day and increase if needed.  I sent in a steroid cream for your rectum to use twice a day for about 7 days.   We will see you in 6 weeks regardless!   I enjoyed seeing you again today! I value our relationship and want to provide genuine, compassionate, and quality care. You may receive a survey regarding your visit with me, and I welcome your feedback! Thanks so much for taking the time to complete this. I look forward to seeing you again.      Gelene Mink, PhD, ANP-BC [facility]

## 2023-11-20 NOTE — Progress Notes (Signed)
 Gastroenterology Office Note     Primary Care Physician:  Ivonne Andrew, NP  Primary Gastroenterologist: Dr. Jena Gauss    Chief Complaint   Chief Complaint  Patient presents with   Follow-up    Pt states clear mucus coming out of her anal opening even without a BM. Also feels like there is a ball or something hard stuff in her behind. Pt states she has been having diarrhea today ( 2 months)     History of Present Illness   Misty Mccullough is a 53 y.o. female presenting today with a history of anemia due to chronic disease and IDA component, possible cirrhosis, history of diarrhea, returning in follow-up after colonoscopy/EGD as noted below.   Possible cirrhosis: Early cirrhosis on elastography but IQR ratio was elevated at 0.63, so this is reduced accuracy.. No varices on recent EGD; platelets normal. She does have splenomegaly. NAFLD score indeterminate. Would benefit from ELF test. 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   For 2 months has had clear discharge coming from rectum. Feels like there is something swollen inside rectum. Having fecal incontinence. Comes without warning. Feels like loose stool and slimy output. Rectum is painful. Feels like vagina is swollen at times. Saw PCP several months ago for vaginal exam.   Prior to 2 months ago, wasn't as bad. Eased off. Had been on antibiotics about a month ago. Diarrhea was present prior to this. Sometimes formed stool, not skipping any days with BM. 7-8 stools a day. Sometimes once a week more consistency.   Has been off dicyclomine for awhile. This was helpful in the past.    EGD May 2024: mild Schatzki ring s/p dilation, gastric erosions, negative H.pylori Colonoscopy May 2024: normal colon, negative random colonic biopsies  CTA July 2024    1. No evidence of hemodynamically significant stenosis or occlusion of any of the mesenteric arteries to suggest a source for chronic mesenteric ischemia. 2. There is extensive  atherosclerotic plaque throughout the abdominal aorta without aneurysm or dissection. Aortic Atherosclerosis (ICD10-I70.0). 3. Mild to moderate stenosis of the origin of the right renal artery. 4. Mild stenosis of the origin of the left renal artery. 5. Moderate stenosis of the proximal left common iliac artery.   NON-VASCULAR   1. 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 No evidence of ascites or visible gastroesophageal varices. 2. No acute abnormality within the abdomen or pelvis.    Past Medical History:  Diagnosis Date   Bilateral lower extremity edema    CHF (congestive heart failure) (HCC)    COPD (chronic obstructive pulmonary disease) (HCC)    Cough    Diabetes mellitus without complication (HCC)    Gallstones    GERD (gastroesophageal reflux disease)    Heavy cigarette smoker    Hemoglobin A1C between 7% and 9% indicating borderline diabetic control (HCC) 08/2019   Hypercholesteremia    Hyperglycemia    Hypertension    Shortness of breath    Stroke (cerebrum) (HCC)    Vitamin D deficiency 12/2020    Past Surgical History:  Procedure Laterality Date   BIOPSY  02/19/2023   Procedure: BIOPSY;  Surgeon: Corbin Ade, MD;  Location: [facility];  Service: Endoscopy;;   COLONOSCOPY WITH PROPOFOL N/A 02/19/2023   Procedure: COLONOSCOPY WITH PROPOFOL;  Surgeon: Corbin Ade, MD;  Location: [facility];  Service: Endoscopy;  Laterality: N/A;  8:15 am, asa 3   ESOPHAGOGASTRODUODENOSCOPY (EGD) WITH PROPOFOL N/A 02/19/2023   Procedure: ESOPHAGOGASTRODUODENOSCOPY (  EGD) WITH PROPOFOL;  Surgeon: Corbin Ade, MD;  Location: [facility];  Service: Endoscopy;  Laterality: N/A;   LEFT HEART CATH AND CORONARY ANGIOGRAPHY N/A 03/07/2023   Procedure: LEFT HEART CATH AND CORONARY ANGIOGRAPHY;  Surgeon: Corky Crafts, MD;  Location: [facility];  Service: Cardiovascular;  Laterality: N/A;   MALONEY DILATION N/A 02/19/2023    Procedure: Elease Hashimoto DILATION;  Surgeon: Corbin Ade, MD;  Location: [facility];  Service: Endoscopy;  Laterality: N/A;   TUBAL LIGATION      Current Outpatient Medications  Medication Sig Dispense Refill   albuterol (PROVENTIL) (2.5 MG/3ML) 0.083% nebulizer solution Take 3 mLs (2.5 mg total) by nebulization every 4 (four) hours as needed for wheezing or shortness of breath. 120 mL 2   albuterol (VENTOLIN HFA) 108 (90 Base) MCG/ACT inhaler Inhale 2 puffs into the lungs every 6 (six) hours as needed for wheezing or shortness of breath. 1 each 11   aspirin EC 81 MG tablet Take 1 tablet (81 mg total) by mouth daily with breakfast. Swallow whole. 30 tablet 12   Aspirin-Caffeine 845-65 MG PACK Take 1 packet by mouth 2 (two) times daily.     Blood Pressure KIT 1 Units by Does not apply route daily. 1 kit 0   carvedilol (COREG) 25 MG tablet Take 1 tablet (25 mg total) by mouth 2 (two) times daily with a meal. 180 tablet 2   cetirizine (ZYRTEC) 10 MG tablet Take 1 tablet (10 mg total) by mouth daily. 30 tablet 11   cyanocobalamin (VITAMIN B12) 1000 MCG tablet Take 1,000 mcg by mouth daily.     cyanocobalamin (VITAMIN B12) 1000 MCG/ML injection Inject 1 mL (1,000 mcg total) into the skin once a week. 1ml injection weeklyX4 then once a month indefinitely 5 mL 1   dicyclomine (BENTYL) 10 MG capsule Take 1 capsule (10 mg total) by mouth 4 (four) times daily -  before meals and at bedtime. 120 capsule 3   empagliflozin (JARDIANCE) 10 MG TABS tablet Take 1 tablet (10 mg total) by mouth daily before breakfast. 90 tablet 1   ezetimibe (ZETIA) 10 MG tablet Take 1 tablet (10 mg total) by mouth daily. 90 tablet 1   ferrous sulfate (SV IRON) 325 (65 FE) MG tablet Take 1 tablet (325 mg total) by mouth daily. 90 tablet 0   fluticasone-salmeterol (ADVAIR) 100-50 MCG/ACT AEPB Inhale 1 puff into the lungs 2 (two) times daily. 180 each 1   folic acid (FOLVITE) 800 MCG tablet Take 1 tablet by mouth once daily 90  tablet 0   furosemide (LASIX) 40 MG tablet Take 1 tablet (40 mg total) by mouth daily. 135 tablet 0   ibuprofen (ADVIL) 200 MG tablet Take 800 mg by mouth every 8 (eight) hours as needed (pain.).     insulin glargine (LANTUS SOLOSTAR) 100 UNIT/ML Solostar Pen Inject 10 Units into the skin daily. 15 mL 1   lisinopril (ZESTRIL) 40 MG tablet Take 1 tablet (40 mg total) by mouth daily. 90 tablet 1   metFORMIN (GLUCOPHAGE-XR) 500 MG 24 hr tablet Take 1 tablet (500 mg total) by mouth 2 (two) times daily with a meal. 180 tablet 1   Nebulizers (COMPRESSOR/NEBULIZER) MISC 1 Units by Does not apply route as needed. 1 each 0   nicotine (NICODERM CQ - DOSED IN MG/24 HOURS) 21 mg/24hr patch Place 1 patch (21 mg total) onto the skin daily. 28 patch 0   nitroGLYCERIN (NITROSTAT) 0.4 MG  SL tablet Place 1 tablet (0.4 mg total) under the tongue every 5 (five) minutes x 3 doses as needed for chest pain (If no relief after 3rd dose proceed to ED or call 911). 25 tablet 3   omeprazole (PRILOSEC) 20 MG capsule Take 1 capsule (20 mg total) by mouth daily. 90 capsule 1   PAIN RELIEVING LIDOCAINE EX Apply 1 Application topically daily.     ranolazine (RANEXA) 500 MG 12 hr tablet Take 1 tablet (500 mg total) by mouth 2 (two) times daily. 60 tablet 3   atorvastatin (LIPITOR) 80 MG tablet Take 1 tablet (80 mg total) by mouth daily. 90 tablet 3   blood glucose meter kit and supplies KIT Dispense based on patient and insurance preference. Use up to four times daily as directed. (Patient not taking: Reported on 11/20/2023) 1 each 1   Continuous Glucose Sensor (FREESTYLE LIBRE 3 PLUS SENSOR) MISC Change sensor every 15 days. (Patient not taking: Reported on 11/20/2023) 6 each 3   Continuous Glucose Sensor (FREESTYLE LIBRE 3 SENSOR) MISC Place 1 sensor on the skin every 14 days. Use to check glucose continuously (Patient not taking: Reported on 11/20/2023) 6 each 1   doxycycline (VIBRA-TABS) 100 MG tablet Take 1 tablet (100 mg total)  by mouth 2 (two) times daily. (Patient not taking: Reported on 11/20/2023) 20 tablet 0   polyethylene glycol powder (GLYCOLAX/MIRALAX) 17 GM/SCOOP powder Take 17 g by mouth 2 (two) times daily as needed. (Patient not taking: Reported on 11/20/2023) 116 g 1   Semaglutide, 1 MG/DOSE, 4 MG/3ML SOPN Inject 1 mg as directed once a week. (Patient not taking: Reported on 11/20/2023) 9 mL 1   Tiotropium Bromide Monohydrate (SPIRIVA RESPIMAT) 2.5 MCG/ACT AERS Inhale 2 puffs into the lungs daily. (Patient not taking: Reported on 11/20/2023) 12 g 1   No current facility-administered medications for this visit.    Allergies as of 11/20/2023   (No Known Allergies)    Family History  Problem Relation Age of Onset   Diabetes Mother    Uterine cancer Mother    Diabetes Father    Heart attack Father 84   Stroke Father    Stroke Sister    Cirrhosis Brother        alcohol   Ovarian cancer Maternal Grandmother    Heart attack Paternal Grandmother    Heart attack Paternal Grandfather    Heart attack Paternal Uncle    Bone cancer Paternal Uncle    Colon cancer Neg Hx        unknown   Colon polyps Neg Hx        unknown    Social History   Socioeconomic History   Marital status: Legally Separated    Spouse name: Not on file   Number of children: 5   Years of education: Not on file   Highest education level: Not on file  Occupational History   Not on file  Tobacco Use   Smoking status: Every Day    Current packs/day: 1.00    Average packs/day: 1 pack/day for 32.0 years (32.0 ttl pk-yrs)    Types: Cigarettes   Smokeless tobacco: Never   Tobacco comments:    Since age 89.  Vaping Use   Vaping status: Never Used  Substance and Sexual Activity   Alcohol use: Not Currently   Drug use: No   Sexual activity: Yes    Partners: Male  Other Topics Concern   Not on file  Social History  Narrative   Not on file   Social Drivers of Health   Financial Resource Strain: Medium Risk (02/20/2023)    Overall Financial Resource Strain (CARDIA)    Difficulty of Paying Living Expenses: Somewhat hard  Food Insecurity: Food Insecurity Present (02/20/2023)   Hunger Vital Sign    Worried About Running Out of Food in the Last Year: Sometimes true    Ran Out of Food in the Last Year: Sometimes true  Transportation Needs: No Transportation Needs (06/05/2023)   PRAPARE - Administrator, Civil Service (Medical): No    Lack of Transportation (Non-Medical): No  Physical Activity: Insufficiently Active (02/28/2023)   Exercise Vital Sign    Days of Exercise per Week: 4 days    Minutes of Exercise per Session: 30 min  Stress: No Stress Concern Present (04/03/2023)   [employer] of Occupational Health - Occupational Stress Questionnaire    Feeling of Stress : Only a little  Social Connections: Socially Isolated (04/03/2023)   Social Connection and Isolation Panel [NHANES]    Frequency of Communication with Friends and Family: More than three times a week    Frequency of Social Gatherings with Friends and Family: More than three times a week    Attends Religious Services: Never    Database administrator or Organizations: No    Attends Banker Meetings: Never    Marital Status: Separated  Intimate Partner Violence: Not At Risk (06/05/2023)   Humiliation, Afraid, Rape, and Kick questionnaire    Fear of Current or Ex-Partner: No    Emotionally Abused: No    Physically Abused: No    Sexually Abused: No     Review of Systems   Gen: Denies any fever, chills, fatigue, weight loss, lack of appetite.  CV: Denies chest pain, heart palpitations, peripheral edema, syncope.  Resp: Denies shortness of breath at rest or with exertion. Denies wheezing or cough.  GI: Denies dysphagia or odynophagia. Denies jaundice, hematemesis, fecal incontinence. GU : Denies urinary burning, urinary frequency, urinary hesitancy MS: Denies joint pain, muscle weakness, cramps, or limitation of  movement.  Derm: Denies rash, itching, dry skin Psych: Denies depression, anxiety, memory loss, and confusion Heme: Denies bruising, bleeding, and enlarged lymph nodes.   Physical Exam   BP (!) 176/67   Pulse 71   Temp 98.4 F (36.9 C)   Ht 5\' 4"  (1.626 m)   Wt 148 lb 9.6 oz (67.4 kg)   LMP  (LMP Unknown)   BMI 25.51 kg/m  General:   Alert and oriented. Pleasant and cooperative. Well-nourished and well-developed.  Head:  Normocephalic and atraumatic. Eyes:  Without icterus Rectal:  excoriated appearing vaginal opening, query lichens sclerosus Rectum with palpable anterior area, anoscopy with right anterior redundant, angry-appearing tissue, liquid stool seeping steadily. CHAPERONE PRESENT.  Msk:  Symmetrical without gross deformities. Normal posture. Extremities:  Without edema. Neurologic:  Alert and  oriented x4;  grossly normal neurologically. Skin:  Intact without significant lesions or rashes. Psych:  Alert and cooperative. Normal mood and affect.   Assessment   Misty Mccullough is a 53 y.o. female presenting today with a history of 53 y.o. female presenting today with a history of anemia due to chronic disease and IDA component, possible cirrhosis, history of diarrhea, returning in follow-up after colonoscopy/EGD as noted below.   Possible cirrhosis: Early cirrhosis on elastography but IQR ratio was elevated at 0.63, so this is reduced accuracy.. No varices on recent EGD; platelets normal.  She does have splenomegaly. NAFLD score indeterminate. Would benefit from ELF test. 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  Main concern today is rectal discharge and rectal swelling along with fecal incontinence. DRE as noted above. Query internal hemorrhoids exacerbating this as she had prominent right anterior "angry" appearing hemorrhoid column. Increased baseline of worsening diarrhea and will need to rule out infectious process. Interesting symptoms of feeling fullness in vaginal area and  rectum. 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 CT abd/pelvis to be arranged due to constellation of symptoms. Would benefit from GYN eval as well.      PLAN     CT abd/pelvis with contrast ASAP MELD labs, AFP, Stool studies May need referral to GYN; awaiting stool tests and imaging Dicyclomine prn Anusol per rectum 6 week close follow-up   Gelene Mink, PhD, ANP-BC [facility]

## 2023-11-21 ENCOUNTER — Ambulatory Visit (HOSPITAL_COMMUNITY)
Admission: RE | Admit: 2023-11-21 | Discharge: 2023-11-21 | Disposition: A | Discharge status: Home / Self Care | Payer: Self-pay | Source: Ambulatory Visit | Attending: Gastroenterology | Admitting: Gastroenterology

## 2023-11-21 ENCOUNTER — Other Ambulatory Visit (HOSPITAL_COMMUNITY)
Admission: RE | Admit: 2023-11-21 | Discharge: 2023-11-21 | Disposition: A | Discharge status: Home / Self Care | Payer: Self-pay | Source: Ambulatory Visit | Attending: Gastroenterology | Admitting: Gastroenterology

## 2023-11-21 DIAGNOSIS — K760 Fatty (change of) liver, not elsewhere classified: Secondary | ICD-10-CM | POA: Insufficient documentation

## 2023-11-21 DIAGNOSIS — R197 Diarrhea, unspecified: Secondary | ICD-10-CM | POA: Insufficient documentation

## 2023-11-21 LAB — CBC WITH DIFFERENTIAL/PLATELET
Abs Immature Granulocytes: 0.02 10*3/uL (ref 0.00–0.07)
Basophils Absolute: 0 10*3/uL (ref 0.0–0.1)
Basophils Relative: 1 %
Eosinophils Absolute: 0.1 10*3/uL (ref 0.0–0.5)
Eosinophils Relative: 3 %
HCT: 31.9 % — ABNORMAL LOW (ref 36.0–46.0)
Hemoglobin: 10.3 g/dL — ABNORMAL LOW (ref 12.0–15.0)
Immature Granulocytes: 1 %
Lymphocytes Relative: 26 %
Lymphs Abs: 1 10*3/uL (ref 0.7–4.0)
MCH: 29.1 pg (ref 26.0–34.0)
MCHC: 32.3 g/dL (ref 30.0–36.0)
MCV: 90.1 fL (ref 80.0–100.0)
Monocytes Absolute: 0.3 10*3/uL (ref 0.1–1.0)
Monocytes Relative: 7 %
Neutro Abs: 2.4 10*3/uL (ref 1.7–7.7)
Neutrophils Relative %: 62 %
Platelets: 180 10*3/uL (ref 150–400)
RBC: 3.54 MIL/uL — ABNORMAL LOW (ref 3.87–5.11)
RDW: 16.4 % — ABNORMAL HIGH (ref 11.5–15.5)
WBC: 3.8 10*3/uL — ABNORMAL LOW (ref 4.0–10.5)
nRBC: 0 % (ref 0.0–0.2)

## 2023-11-21 LAB — COMPREHENSIVE METABOLIC PANEL
ALT: 27 U/L (ref 0–44)
AST: 20 U/L (ref 15–41)
Albumin: 2.3 g/dL — ABNORMAL LOW (ref 3.5–5.0)
Alkaline Phosphatase: 97 U/L (ref 38–126)
Anion gap: 10 (ref 5–15)
BUN: 10 mg/dL (ref 6–20)
CO2: 22 mmol/L (ref 22–32)
Calcium: 8.6 mg/dL — ABNORMAL LOW (ref 8.9–10.3)
Chloride: 101 mmol/L (ref 98–111)
Creatinine, Ser: 0.74 mg/dL (ref 0.44–1.00)
GFR, Estimated: >60 mL/min (ref >60)
Glucose, Bld: 290 mg/dL — ABNORMAL HIGH (ref 70–99)
Potassium: 3.6 mmol/L (ref 3.5–5.1)
Sodium: 133 mmol/L — ABNORMAL LOW (ref 135–145)
Total Bilirubin: 0.5 mg/dL (ref 0.0–1.2)
Total Protein: 6 g/dL — ABNORMAL LOW (ref 6.5–8.1)

## 2023-11-21 LAB — POCT I-STAT CREATININE: Creatinine, Ser: 0.9 mg/dL (ref 0.44–1.00)

## 2023-11-21 MED ORDER — IOHEXOL 300 MG/ML  SOLN
100.0000 mL | Freq: Once | INTRAMUSCULAR | Status: AC | PRN
  Administered 2023-11-21: 100 mL via INTRAVENOUS

## 2023-11-22 LAB — AFP TUMOR MARKER: AFP, Serum, Tumor Marker: 2.7 ng/mL (ref 0.0–9.2)

## 2023-11-25 LAB — MISC LABCORP TEST (SEND OUT): Labcorp test code: 550960

## 2023-11-26 ENCOUNTER — Other Ambulatory Visit: Payer: Self-pay | Admitting: *Deleted

## 2023-11-26 DIAGNOSIS — R9389 Abnormal findings on diagnostic imaging of other specified body structures: Secondary | ICD-10-CM

## 2023-11-26 NOTE — Addendum Note (Signed)
 Addended by: Elinor Dodge on: 11/26/2023 08:07 AM   Modules accepted: Orders

## 2023-11-28 LAB — NASH FIBROSURE
ALPHA 2-MACROGLOBULINS, QN: 826 mg/dL — ABNORMAL HIGH (ref 110–276)
ALT (SGPT) P5P: 42 [IU]/L — ABNORMAL HIGH (ref 0–40)
AST (SGOT) P5P: 44 [IU]/L — ABNORMAL HIGH (ref 0–40)
Apolipoprotein A-1: 298 mg/dL — ABNORMAL HIGH (ref 116–209)
Bilirubin, Total: <0.1 mg/dL (ref 0.0–1.2)
Cholesterol, Total: 297 mg/dL — ABNORMAL HIGH (ref 100–199)
GGT: 96 [IU]/L — ABNORMAL HIGH (ref 0–60)
Glucose: 305 mg/dL — ABNORMAL HIGH (ref 70–99)
Haptoglobin: 314 mg/dL (ref 33–346)
Triglycerides: 171 mg/dL — ABNORMAL HIGH (ref 0–149)

## 2023-11-28 LAB — MISC LABCORP TEST (SEND OUT): Labcorp test code: 550659

## 2023-12-02 ENCOUNTER — Ambulatory Visit (HOSPITAL_COMMUNITY): Admission: RE | Admit: 2023-12-02 | Payer: Medicaid Other | Source: Ambulatory Visit

## 2023-12-08 ENCOUNTER — Telehealth: Payer: Self-pay | Admitting: Gastroenterology

## 2023-12-08 ENCOUNTER — Other Ambulatory Visit: Payer: Self-pay | Admitting: Gastroenterology

## 2023-12-08 MED ORDER — ZENPEP 60000-189600 UNITS PO CPEP
60000.0000 [IU] | ORAL_CAPSULE | Freq: Three times a day (TID) | ORAL | 3 refills | Status: AC
Start: 2023-12-08 — End: ?

## 2023-12-08 NOTE — Telephone Encounter (Signed)
 Tammy:  Please refer patient to GYN. Query lichens sclerosus vaginally and she also notes sensation of pressure in vaginal area and swelling.

## 2023-12-09 ENCOUNTER — Other Ambulatory Visit: Payer: Self-pay | Admitting: *Deleted

## 2023-12-09 DIAGNOSIS — N904 Leukoplakia of vulva: Secondary | ICD-10-CM

## 2023-12-09 NOTE — Telephone Encounter (Signed)
 Referral sent

## 2023-12-12 ENCOUNTER — Ambulatory Visit: Admitting: Adult Health

## 2023-12-12 ENCOUNTER — Encounter: Payer: Self-pay | Admitting: Adult Health

## 2023-12-12 VITALS — BP 173/77 | HR 90 | Ht 64.0 in | Wt 154.5 lb

## 2023-12-12 DIAGNOSIS — K6289 Other specified diseases of anus and rectum: Secondary | ICD-10-CM | POA: Diagnosis not present

## 2023-12-12 DIAGNOSIS — N907 Vulvar cyst: Secondary | ICD-10-CM | POA: Diagnosis not present

## 2023-12-12 DIAGNOSIS — K649 Unspecified hemorrhoids: Secondary | ICD-10-CM

## 2023-12-12 DIAGNOSIS — N9089 Other specified noninflammatory disorders of vulva and perineum: Secondary | ICD-10-CM | POA: Diagnosis not present

## 2023-12-12 DIAGNOSIS — Z1211 Encounter for screening for malignant neoplasm of colon: Secondary | ICD-10-CM | POA: Diagnosis not present

## 2023-12-12 DIAGNOSIS — R159 Full incontinence of feces: Secondary | ICD-10-CM

## 2023-12-12 DIAGNOSIS — Z1331 Encounter for screening for depression: Secondary | ICD-10-CM

## 2023-12-12 DIAGNOSIS — I10 Essential (primary) hypertension: Secondary | ICD-10-CM

## 2023-12-12 DIAGNOSIS — D239 Other benign neoplasm of skin, unspecified: Secondary | ICD-10-CM

## 2023-12-12 LAB — HEMOCCULT GUIAC POC 1CARD (OFFICE): Fecal Occult Blood, POC: NEGATIVE

## 2023-12-12 MED ORDER — TRIAMCINOLONE ACETONIDE 0.5 % EX OINT: 1.0000 | TOPICAL_OINTMENT | Freq: Two times a day (BID) | CUTANEOUS | 1 refills | Status: AC

## 2023-12-12 NOTE — Progress Notes (Signed)
 Subjective:     Patient ID: Misty Mccullough, female   DOB: Jun 25, 1971, 53 y.o.   MRN: [id number]  HPI [name] is a 53 year old white female, separated , PM in for ?lichen sclerosus, she had itching and was treated for yeast and itching has resolved, she says it feels like something stuck in rectum.  She says she has diarrhea and is incontinent of it, can't hold it. She has seen GI.  Last pap was 03/19/23, negative with PCP PCP is Angus Seller NP  Review of Systems ?lichen sclerosus, she had itching and was treated for yeast and itching has resolved,  she says it feels like something stuck in rectum.  +diarrhea and is incontinent of it, can't hold it, and has mucous like blood from rectum She is not having sex Reviewed past medical,surgical, social and family history. Reviewed medications and allergies.     Objective:   Physical Exam BP (!) 173/77 (BP Location: Right Arm, Patient Position: Sitting, Cuff Size: Normal)   Pulse 90   Ht 5\' 4"  (1.626 m)   Wt 154 lb 8 oz (70.1 kg)   LMP  (LMP Unknown)   BMI 26.52 kg/m     Skin warm and dry.Lungs: clear to ausculation bilaterally. Cardiovascular: regular rate and rhythm.  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 Bladder is non tender and no masses felt.On rectal exam, has poor sphincter tone,+hemorrhoids, hemoccult was negative  AA is 0 Fall risk is low    12/12/2023   10:05 AM 07/23/2023    3:03 PM 12/25/2022   10:27 AM  Depression screen PHQ 2/9  Decreased Interest 3 1 0  Down, Depressed, Hopeless 2 1 0  PHQ - 2 Score 5 2 0  Altered sleeping 1 2 2   Tired, decreased energy 3 2 2   Change in appetite 1 2 2   Feeling bad or failure about  yourself  0 0 0  Trouble concentrating 0 0 0  Moving slowly or fidgety/restless 0 0 0  Suicidal thoughts 0 0 0  PHQ-9 Score 10 8 6   Difficult doing work/chores  Not difficult at all        12/12/2023   10:08 AM  GAD 7 : Generalized Anxiety Score  Nervous, Anxious, on Edge 0  Control/stop worrying 0  Worry too much - different things 1  Trouble relaxing 1  Restless 0  Easily annoyed or irritable 0  Afraid - awful might happen 0  Total GAD 7 Score 2    Upstream - 12/12/23 1003       Pregnancy Intention Screening   Does the patient want to become pregnant in the next year? N/A    Does the patient's partner want to become pregnant in the next year? N/A    Would the patient like to discuss contraceptive options today? N/A      Contraception Wrap Up   Current Method Abstinence   PM   End Method Abstinence   PM   Contraception Counseling Provided No            Examination chaperoned by Malachy Mood LPN    Assessment:     1. Vulvar irritation (Primary) Feels irritated Will rx triamcinolone ointment use  bid for 2 weeks then take a break and then can resume  Meds ordered this encounter  Medications   triamcinolone ointment (KENALOG) 0.5 %    Sig: Apply 1 Application topically 2 (two) times daily.    Dispense:  30 g    Refill:  1    Supervising Provider:   Duane Lope H [[id number]]     2. Hemorrhoids, unspecified hemorrhoid type   3. Full incontinence of feces Gets irritated and wears depends Try aquaphor 3N1 cream  Follow up with GI  4. Rectal pain  5. Sebaceous cyst of labia Leave alone   6. Angiokeratoma  7. Essential hypertension Take meds and follow with PCP  8. Encounter for screening fecal occult blood testing Hemoccult was negative - POCT occult blood stool     Plan:     Follow up in 4 weeks for exam

## 2023-12-15 NOTE — Telephone Encounter (Signed)
 Pt needed to reschedule CT for 12/17/23. Pt has been rescheduled until Friday, 12/19/23, arrive at 11:45 am. Pt has been informed

## 2023-12-17 ENCOUNTER — Ambulatory Visit (HOSPITAL_COMMUNITY): Payer: Medicaid Other

## 2023-12-17 ENCOUNTER — Other Ambulatory Visit: Payer: Self-pay | Admitting: Nurse Practitioner

## 2023-12-17 DIAGNOSIS — D519 Vitamin B12 deficiency anemia, unspecified: Secondary | ICD-10-CM

## 2023-12-17 DIAGNOSIS — R790 Abnormal level of blood mineral: Secondary | ICD-10-CM

## 2023-12-17 NOTE — Progress Notes (Deleted)
 Patient Care Team: Misty Andrew, NP as PCP - General (Pulmonary Disease) Marjo Bicker, MD as PCP - Cardiology (Cardiology) Alden Hipp, RPH-CPP (Pharmacist)  Clinic Day:  12/17/2023  Referring physician: Ivonne Andrew, NP  ASSESSMENT & PLAN:   Assessment & Plan: Anemia due to vitamin B12 deficiency -She developed mild anemia in 2020 that normalized in 2022, and recurred/progressed since 11/2021 hgb 10.8 -She had acute anemia during recent hospitalizations for PNA, COPD and CHF exacerbations, lowest Hgb 7.3 -B12 was normal in 2022; iron studies 12/2022 showed ferritin 25 and 10% saturation. She was started on oral iron and folic acid 12/2022 -lab on 01/30/2023 showed low B12 at 137 -B12 injections started in 03/2023 and she is on home injections now  - intrinsic factor was negative     The patient understands the plans discussed today and is in agreement with them.  She knows to contact our office if she develops concerns prior to her next appointment.  I provided *** minutes of face-to-face time during this encounter and > 50% was spent counseling as documented under my assessment and plan.    Misty Jews, NP  [facility] - A DEPT OF [facility] [address] [place] [address] Dept: [phone] Dept Fax: [phone]   No orders of the defined types were placed in this encounter.     CHIEF COMPLAINT:  CC: B12 deficiency, iron deficiency anemia  Current Treatment: Monthly vitamin B12 injections  INTERVAL HISTORY:  Misty Mccullough is here today for repeat clinical assessment.  Most recently seen on 06/19/2023 by Dr. Ricki Mccullough.  She does administer her own B12 injections at home.  She denies fevers or chills. She denies pain. Her appetite is good. Her weight {Weight change:10426}.  I have reviewed the past medical history, past surgical history, social history and family history with the patient  and they are unchanged from previous note.  ALLERGIES:  has no known allergies.  MEDICATIONS:  Current Outpatient Medications  Medication Sig Dispense Refill   albuterol (PROVENTIL) (2.5 MG/3ML) 0.083% nebulizer solution Take 3 mLs (2.5 mg total) by nebulization every 4 (four) hours as needed for wheezing or shortness of breath. 120 mL 2   albuterol (VENTOLIN HFA) 108 (90 Base) MCG/ACT inhaler Inhale 2 puffs into the lungs every 6 (six) hours as needed for wheezing or shortness of breath. 1 each 11   aspirin EC 81 MG tablet Take 1 tablet (81 mg total) by mouth daily with breakfast. Swallow whole. 30 tablet 12   Aspirin-Caffeine 845-65 MG PACK Take 1 packet by mouth 3 (three) times daily as needed.     atorvastatin (LIPITOR) 80 MG tablet Take 1 tablet (80 mg total) by mouth daily. 90 tablet 3   Blood Pressure KIT 1 Units by Does not apply route daily. 1 kit 0   carvedilol (COREG) 25 MG tablet Take 1 tablet (25 mg total) by mouth 2 (two) times daily with a meal. 180 tablet 2   cetirizine (ZYRTEC) 10 MG tablet Take 1 tablet (10 mg total) by mouth daily. 30 tablet 11   cyanocobalamin (VITAMIN B12) 1000 MCG tablet Take 1,000 mcg by mouth daily.     dicyclomine (BENTYL) 10 MG capsule Take 1 capsule (10 mg total) by mouth 4 (four) times daily -  before meals and at bedtime. 120 capsule 3   empagliflozin (JARDIANCE) 10 MG TABS tablet Take 1 tablet (10 mg  total) by mouth daily before breakfast. 90 tablet 1   ezetimibe (ZETIA) 10 MG tablet Take 1 tablet (10 mg total) by mouth daily. 90 tablet 1   ferrous sulfate (SV IRON) 325 (65 FE) MG tablet Take 1 tablet (325 mg total) by mouth daily. 90 tablet 0   fluticasone-salmeterol (ADVAIR) 100-50 MCG/ACT AEPB Inhale 1 puff into the lungs 2 (two) times daily. 180 each 1   folic acid (FOLVITE) 800 MCG tablet Take 1 tablet by mouth once daily 90 tablet 0   furosemide (LASIX) 40 MG tablet Take 1 tablet (40 mg total) by mouth daily. 135 tablet 0   hydrocortisone  (ANUSOL-HC) 2.5 % rectal cream Place 1 Application rectally 2 (two) times daily. For rectal discomfort 30 g 1   ibuprofen (ADVIL) 200 MG tablet Take 800 mg by mouth every 8 (eight) hours as needed (pain.).     insulin glargine (LANTUS SOLOSTAR) 100 UNIT/ML Solostar Pen Inject 10 Units into the skin daily. 15 mL 1   lisinopril (ZESTRIL) 40 MG tablet Take 1 tablet (40 mg total) by mouth daily. 90 tablet 1   metFORMIN (GLUCOPHAGE-XR) 500 MG 24 hr tablet Take 1 tablet (500 mg total) by mouth 2 (two) times daily with a meal. 180 tablet 1   Nebulizers (COMPRESSOR/NEBULIZER) MISC 1 Units by Does not apply route as needed. 1 each 0   nitroGLYCERIN (NITROSTAT) 0.4 MG SL tablet Place 1 tablet (0.4 mg total) under the tongue every 5 (five) minutes x 3 doses as needed for chest pain (If no relief after 3rd dose proceed to ED or call 911). 25 tablet 3   omeprazole (PRILOSEC) 20 MG capsule Take 1 capsule (20 mg total) by mouth daily. 90 capsule 1   PAIN RELIEVING LIDOCAINE EX Apply 1 Application topically daily.     Pancrelipase, Lip-Prot-Amyl, (ZENPEP) 95284-132440 units CPEP Take 60,000 units of lipase by mouth 3 (three) times daily with meals. (1 capsule with meals three times a day and 1 with snacks). 180 capsule 3   ranolazine (RANEXA) 500 MG 12 hr tablet Take 1 tablet (500 mg total) by mouth 2 (two) times daily. 60 tablet 3   triamcinolone ointment (KENALOG) 0.5 % Apply 1 Application topically 2 (two) times daily. 30 g 1   No current facility-administered medications for this visit.    HISTORY OF PRESENT ILLNESS:   Oncology History   No history exists.      REVIEW OF SYSTEMS:   Constitutional: Denies fevers, chills or abnormal weight loss Eyes: Denies blurriness of vision Ears, nose, mouth, throat, and face: Denies mucositis or sore throat Respiratory: Denies cough, dyspnea or wheezes Cardiovascular: Denies palpitation, chest discomfort or lower extremity swelling Gastrointestinal:  Denies  nausea, heartburn or change in bowel habits Skin: Denies abnormal skin rashes Lymphatics: Denies new lymphadenopathy or easy bruising Neurological:Denies numbness, tingling or new weaknesses Behavioral/Psych: Mood is stable, no new changes  All other systems were reviewed with the patient and are negative.   VITALS:  There were no vitals taken for this visit.  Wt Readings from Last 3 Encounters:  12/12/23 154 lb 8 oz (70.1 kg)  11/20/23 148 lb 9.6 oz (67.4 kg)  09/22/23 137 lb 6.4 oz (62.3 kg)    There is no height or weight on file to calculate BMI.  Performance status (ECOG): {CHL ONC [id number]  PHYSICAL EXAM:   GENERAL:alert, no distress and comfortable SKIN: skin color, texture, turgor are normal, no rashes or significant lesions EYES: normal, Conjunctiva  are pink and non-injected, sclera clear OROPHARYNX:no exudate, no erythema and lips, buccal mucosa, and tongue normal  NECK: supple, thyroid normal size, non-tender, without nodularity LYMPH:  no palpable lymphadenopathy in the cervical, axillary or inguinal LUNGS: clear to auscultation and percussion with normal breathing effort HEART: regular rate & rhythm and no murmurs and no lower extremity edema ABDOMEN:abdomen soft, non-tender and normal bowel sounds Musculoskeletal:no cyanosis of digits and no clubbing  NEURO: alert & oriented x 3 with fluent speech, no focal motor/sensory deficits  LABORATORY DATA:  I have reviewed the data as listed    Component Value Date/Time   NA 133 (L) 11/21/2023 1034   NA 140 12/25/2022 1120   K 3.6 11/21/2023 1034   CL 101 11/21/2023 1034   CO2 22 11/21/2023 1034   GLUCOSE 290 (H) 11/21/2023 1034   BUN 10 11/21/2023 1034   BUN 10 12/25/2022 1120   CREATININE 0.74 11/21/2023 1034   CREATININE 0.89 01/20/2023 1338   CREATININE 0.62 03/07/2017 0918   CALCIUM 8.6 (L) 11/21/2023 1034   PROT 6.0 (L) 11/21/2023 1034   PROT 5.8 (L) 12/25/2022 1120   ALBUMIN 2.3 (L) 11/21/2023 1034    ALBUMIN 3.4 (L) 12/25/2022 1120   AST 20 11/21/2023 1034   AST 15 01/20/2023 1338   ALT 27 11/21/2023 1034   ALT 11 01/20/2023 1338   ALKPHOS 97 11/21/2023 1034   BILITOT 0.5 11/21/2023 1034   BILITOT 0.3 01/20/2023 1338   GFRNONAA >60 11/21/2023 1034   GFRNONAA >60 01/20/2023 1338   GFRNONAA >89 03/07/2017 0918   GFRAA 124 04/21/2019 1204   GFRAA >89 03/07/2017 0918    No results found for: "SPEP", "UPEP"  Lab Results  Component Value Date   WBC 3.8 (L) 11/21/2023   NEUTROABS 2.4 11/21/2023   HGB 10.3 (L) 11/21/2023   HCT 31.9 (L) 11/21/2023   MCV 90.1 11/21/2023   PLT 180 11/21/2023      Chemistry      Component Value Date/Time   NA 133 (L) 11/21/2023 1034   NA 140 12/25/2022 1120   K 3.6 11/21/2023 1034   CL 101 11/21/2023 1034   CO2 22 11/21/2023 1034   BUN 10 11/21/2023 1034   BUN 10 12/25/2022 1120   CREATININE 0.74 11/21/2023 1034   CREATININE 0.89 01/20/2023 1338   CREATININE 0.62 03/07/2017 0918   GLU 305 (H) 11/21/2023 1033      Component Value Date/Time   CALCIUM 8.6 (L) 11/21/2023 1034   ALKPHOS 97 11/21/2023 1034   AST 20 11/21/2023 1034   AST 15 01/20/2023 1338   ALT 27 11/21/2023 1034   ALT 11 01/20/2023 1338   BILITOT 0.5 11/21/2023 1034   BILITOT 0.3 01/20/2023 1338       RADIOGRAPHIC STUDIES: I have personally reviewed the radiological images as listed and agreed with the findings in the report. CT ABDOMEN PELVIS W CONTRAST Result Date: 11/23/2023 CLINICAL DATA:  Diarrhea, abdominal pain, fecal incontinence, vaginal and rectal pain. EXAM: CT ABDOMEN AND PELVIS WITH CONTRAST TECHNIQUE: Multidetector CT imaging of the abdomen and pelvis was performed using the standard protocol following bolus administration of intravenous contrast. RADIATION DOSE REDUCTION: This exam was performed according to the departmental dose-optimization program which includes automated exposure control, adjustment of the mA and/or kV according to patient size  and/or use of iterative reconstruction technique. CONTRAST:  OMNIPAQUE IOHEXOL 300 MG/ML  SOLN COMPARISON:  CT May 02, 2023 FINDINGS: Lower chest: Small right and trace left  pleural effusions. Hepatobiliary: Cirrhotic hepatic morphology no suspicious hepatic lesion. Gallbladder is unremarkable. No biliary ductal dilation. Pancreas: No pancreatic ductal dilation or evidence of acute inflammation. Spleen: Splenomegaly. Adrenals/Urinary Tract: No suspicious adrenal mass. No hydronephrosis. Kidneys demonstrate symmetric enhancement. Urinary bladder is unremarkable for degree of distension. Stomach/Bowel: Stomach is minimally distended limiting evaluation. No pathologic dilation of small or large bowel. Diffuse colonic wall thickening. Vascular/Lymphatic: Aortic atherosclerosis. Normal caliber abdominal aorta. Smooth IVC contours. Question small volume of nonocclusive thrombus versus mixing artifact at the portal confluence on image 35/2. No pathologically enlarged abdominal or pelvic lymph nodes. Reproductive: Status post hysterectomy. No adnexal masses. Other: Nonspecific subcutaneous edema. Musculoskeletal: No acute osseous abnormality. IMPRESSION: 1. Diffuse colonic wall thickening, which may reflect portal colopathy or colitis. 2. Cirrhotic hepatic morphology with splenomegaly. 3. Question small volume of nonocclusive thrombus versus mixing artifact at the portal confluence. Consider further evaluation with CTA of the abdomen. 4. Small right and trace left pleural effusions. 5.  Aortic Atherosclerosis (ICD10-I70.0). Electronically Signed   By: Maudry Mayhew M.D.   On: 11/23/2023 11:24

## 2023-12-17 NOTE — Assessment & Plan Note (Deleted)
-  She developed mild anemia in 2020 that normalized in 2022, and recurred/progressed since 11/2021 hgb 10.8 -She had acute anemia during recent hospitalizations for PNA, COPD and CHF exacerbations, lowest Hgb 7.3 -B12 was normal in 2022; iron studies 12/2022 showed ferritin 25 and 10% saturation. She was started on oral iron and folic acid 12/2022 -lab on 01/30/2023 showed low B12 at 137 -B12 injections started in 03/2023 and she is on home injections now  - intrinsic factor was negative

## 2023-12-18 ENCOUNTER — Inpatient Hospital Stay: Payer: Medicaid Other | Admitting: Nurse Practitioner

## 2023-12-18 ENCOUNTER — Inpatient Hospital Stay: Payer: Medicaid Other

## 2023-12-18 ENCOUNTER — Telehealth: Payer: Self-pay

## 2023-12-18 DIAGNOSIS — D519 Vitamin B12 deficiency anemia, unspecified: Secondary | ICD-10-CM

## 2023-12-18 NOTE — Telephone Encounter (Signed)
 Called patient due to not showing up for her 900 lab appointment. She stated she canceled her app yesterday. Will send message to scheduling to get it rescheduled.

## 2023-12-19 ENCOUNTER — Ambulatory Visit (HOSPITAL_COMMUNITY)
Admission: RE | Admit: 2023-12-19 | Discharge: 2023-12-19 | Disposition: A | Discharge status: Home / Self Care | Payer: Self-pay | Source: Ambulatory Visit | Attending: Gastroenterology | Admitting: Gastroenterology

## 2023-12-19 DIAGNOSIS — R9389 Abnormal findings on diagnostic imaging of other specified body structures: Secondary | ICD-10-CM | POA: Diagnosis present

## 2023-12-19 MED ORDER — IOHEXOL 350 MG/ML SOLN
100.0000 mL | Freq: Once | INTRAVENOUS | Status: AC | PRN
  Administered 2023-12-19: 100 mL via INTRAVENOUS

## 2023-12-22 ENCOUNTER — Ambulatory Visit: Payer: Self-pay | Admitting: Nurse Practitioner

## 2024-01-06 ENCOUNTER — Encounter: Payer: Self-pay | Admitting: Nurse Practitioner

## 2024-01-07 ENCOUNTER — Encounter: Payer: Self-pay | Admitting: Nurse Practitioner

## 2024-01-08 ENCOUNTER — Encounter: Payer: Self-pay | Admitting: Nurse Practitioner

## 2024-01-09 ENCOUNTER — Ambulatory Visit: Payer: Self-pay | Admitting: Adult Health

## 2024-01-20 ENCOUNTER — Ambulatory Visit: Payer: Medicaid Other | Admitting: Gastroenterology

## 2024-02-03 ENCOUNTER — Encounter: Payer: Self-pay | Admitting: *Deleted

## 2024-02-10 ENCOUNTER — Other Ambulatory Visit: Payer: Self-pay | Admitting: *Deleted

## 2024-02-10 ENCOUNTER — Encounter: Payer: Self-pay | Admitting: Gastroenterology

## 2024-02-10 ENCOUNTER — Ambulatory Visit (INDEPENDENT_AMBULATORY_CARE_PROVIDER_SITE_OTHER): Payer: Self-pay | Admitting: Gastroenterology

## 2024-02-10 VITALS — BP 200/78 | HR 74 | Temp 97.1°F | Ht 64.0 in | Wt 143.6 lb

## 2024-02-10 DIAGNOSIS — K7581 Nonalcoholic steatohepatitis (NASH): Secondary | ICD-10-CM

## 2024-02-10 DIAGNOSIS — K629 Disease of anus and rectum, unspecified: Secondary | ICD-10-CM

## 2024-02-10 DIAGNOSIS — K529 Noninfective gastroenteritis and colitis, unspecified: Secondary | ICD-10-CM

## 2024-02-10 DIAGNOSIS — R161 Splenomegaly, not elsewhere classified: Secondary | ICD-10-CM

## 2024-02-10 DIAGNOSIS — K6289 Other specified diseases of anus and rectum: Secondary | ICD-10-CM

## 2024-02-10 DIAGNOSIS — K7460 Unspecified cirrhosis of liver: Secondary | ICD-10-CM

## 2024-02-10 NOTE — Patient Instructions (Addendum)
 Let's stop dicyclomine , as this is not helping.  Continue Zenpep  as you are doing!  I am referring you to a colorectal surgeon for a second opinion on rectal concerns.  I recommend going to the emergency room due to significant high blood pressure.   Delman Ferns, PhD, ANP-BC [facility]

## 2024-02-10 NOTE — Progress Notes (Signed)
 Gastroenterology Office Note     Primary Care Physician:  Jerrlyn Morel, NP  Primary Gastroenterologist: Dr. Riley Cheadle    Chief Complaint   Chief Complaint  Patient presents with   Follow-up    Patient here today for a follow up on Hepatic steatosis. Patient says she has some mid abdominal pain and some lower peri bladder area, she says this area is hard. She takes bentyl  up to QID.      History of Present Illness   Misty Mccullough is a 53 y.o. 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   Likely cirrhosis: Early cirrhosis on elastography but IQR ratio was elevated at 0.63, so this is reduced accuracy. CTA recently with cirrhosis and splenomegaly.  No varices on recent EGD; platelets normal. She does have splenomegaly. NAFLD score indeterminate. ELF high at 12.16. 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 Serologies completed July 2024.    Chronic migraines. BP initially 209/79, repeat 218/82, pulse 78. No chest pain or SOB. No blurred vision. She is declining ED evaluation. Also discussed with daughter, who spoke on pphone with patient and recommended ED evaluation.   Diarrhea: Has noted significant improvement with pancreas enzymes. Taking while eating, about twice a day. Rare incontinence episodes now. Occasional lower pain suprapubic area, and sometimes cramping prior to BM. Will feel bloated. Celiac serologies negative. Feels like mucus from rectum is getting worse. Sometimes prolapsing tissue. Dicyclomine  when eating. She doesn't feel like dicyclomine  helps. No food aversions, no food fear. Omeprazole  once daily. GERD controlled.   Feels pressure like her rectum will fall out at times.    CT Feb 2025: diffuse colonic wall thickening, likely portal colopathy. Cirrhosis. Possible portal vein thrombus, negative CTA March 2025 for thrombus.   EGD  May 2024: mild Schatzki ring s/p dilation, gastric erosions, negative H.pylori Colonoscopy May 2024: normal colon, negative random colonic biopsies   CTA July 2024    1. No evidence of hemodynamically significant stenosis or occlusion of any of the mesenteric arteries to suggest a source for chronic mesenteric ischemia. 2. There is extensive atherosclerotic plaque throughout the abdominal aorta without aneurysm or dissection. Aortic Atherosclerosis (ICD10-I70.0). 3. Mild to moderate stenosis of the origin of the right renal artery. 4. Mild stenosis of the origin of the left renal artery. 5. Moderate stenosis of the proximal left common iliac artery.   NON-VASCULAR   1. 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 No evidence of ascites or visible gastroesophageal varices. 2. No acute abnormality within the abdomen or pelvis  Past Medical History:  Diagnosis Date   Bilateral lower extremity edema    CHF (congestive heart failure) (HCC)    COPD (chronic obstructive pulmonary disease) (HCC)    Cough    Diabetes mellitus without complication (HCC)    Gallstones    GERD (gastroesophageal reflux disease)    Heavy cigarette smoker    Hemoglobin A1C between 7% and 9% indicating borderline diabetic control (HCC) 08/2019   Hypercholesteremia    Hyperglycemia    Hypertension    Shortness of breath    Stroke (cerebrum) (HCC)    Vitamin D  deficiency 12/2020    Past Surgical History:  Procedure Laterality Date   BIOPSY  02/19/2023   Procedure: BIOPSY;  Surgeon: Suzette Espy, MD;  [facility]: [facility];  Service: Endoscopy;;   COLONOSCOPY WITH PROPOFOL  N/A 02/19/2023  Procedure: COLONOSCOPY WITH PROPOFOL ;  Surgeon: Suzette Espy, MD;  Location: [facility];  Service: Endoscopy;  Laterality: N/A;  8:15 am, asa 3   ESOPHAGOGASTRODUODENOSCOPY (EGD) WITH PROPOFOL  N/A 02/19/2023   Procedure: ESOPHAGOGASTRODUODENOSCOPY (EGD) WITH PROPOFOL ;  Surgeon: Suzette Espy, MD;  Location: [facility];  Service: Endoscopy;  Laterality: N/A;   LEFT HEART CATH AND CORONARY ANGIOGRAPHY N/A 03/07/2023   Procedure: LEFT HEART CATH AND CORONARY ANGIOGRAPHY;  Surgeon: Lucendia Rusk, MD;  Location: [facility];  Service: Cardiovascular;  Laterality: N/A;   MALONEY DILATION N/A 02/19/2023   Procedure: Londa Rival DILATION;  Surgeon: Suzette Espy, MD;  Location: [facility];  Service: Endoscopy;  Laterality: N/A;   TUBAL LIGATION      Current Outpatient Medications  Medication Sig Dispense Refill   albuterol  (PROVENTIL ) (2.5 MG/3ML) 0.083% nebulizer solution Take 3 mLs (2.5 mg total) by nebulization every 4 (four) hours as needed for wheezing or shortness of breath. 120 mL 2   albuterol  (VENTOLIN  HFA) 108 (90 Base) MCG/ACT inhaler Inhale 2 puffs into the lungs every 6 (six) hours as needed for wheezing or shortness of breath. 1 each 11   aspirin  EC 81 MG tablet Take 1 tablet (81 mg total) by mouth daily with breakfast. Swallow whole. 30 tablet 12   Aspirin -Caffeine 845-65 MG PACK Take 1 packet by mouth 3 (three) times daily as needed.     atorvastatin  (LIPITOR ) 80 MG tablet Take 1 tablet (80 mg total) by mouth daily. 90 tablet 3   Blood Pressure KIT 1 Units by Does not apply route daily. 1 kit 0   carvedilol  (COREG ) 25 MG tablet Take 1 tablet (25 mg total) by mouth 2 (two) times daily with a meal. 180 tablet 2   cetirizine  (ZYRTEC ) 10 MG tablet Take 1 tablet (10 mg total) by mouth daily. 30 tablet 11   cyanocobalamin  (VITAMIN B12) 1000 MCG tablet Take 1,000 mcg by mouth daily.     dicyclomine  (BENTYL ) 10 MG capsule Take 1 capsule (10 mg total) by mouth 4 (four) times daily -  before meals and at bedtime. 120 capsule 3   empagliflozin  (JARDIANCE ) 10 MG TABS tablet Take 1 tablet (10 mg total) by mouth daily before breakfast. 90 tablet 1   ezetimibe  (ZETIA ) 10 MG tablet Take 1 tablet (10 mg total) by mouth daily. 90 tablet 1   ferrous sulfate  (SV IRON )  325 (65 FE) MG tablet Take 1 tablet (325 mg total) by mouth daily. 90 tablet 0   fluticasone -salmeterol (ADVAIR) 100-50 MCG/ACT AEPB Inhale 1 puff into the lungs 2 (two) times daily. 180 each 1   folic acid  (FOLVITE ) 800 MCG tablet Take 1 tablet by mouth once daily 90 tablet 0   furosemide  (LASIX ) 40 MG tablet Take 1 tablet (40 mg total) by mouth daily. 135 tablet 0   hydrocortisone  (ANUSOL -HC) 2.5 % rectal cream Place 1 Application rectally 2 (two) times daily. For rectal discomfort 30 g 1   ibuprofen (ADVIL) 200 MG tablet Take 800 mg by mouth every 8 (eight) hours as needed (pain.).     insulin  glargine (LANTUS  SOLOSTAR) 100 UNIT/ML Solostar Pen Inject 10 Units into the skin daily. 15 mL 1   lisinopril  (ZESTRIL ) 40 MG tablet Take 1 tablet (40 mg total) by mouth daily. 90 tablet 1   metFORMIN  (GLUCOPHAGE -XR) 500 MG 24 hr tablet Take 1 tablet (500 mg total) by mouth 2 (two) times daily with a meal. 180 tablet  1   Nebulizers (COMPRESSOR/NEBULIZER) MISC 1 Units by Does not apply route as needed. 1 each 0   nitroGLYCERIN  (NITROSTAT ) 0.4 MG SL tablet Place 1 tablet (0.4 mg total) under the tongue every 5 (five) minutes x 3 doses as needed for chest pain (If no relief after 3rd dose proceed to ED or call 911). 25 tablet 3   omeprazole  (PRILOSEC) 20 MG capsule Take 1 capsule (20 mg total) by mouth daily. 90 capsule 1   ranolazine  (RANEXA ) 500 MG 12 hr tablet Take 1 tablet (500 mg total) by mouth 2 (two) times daily. 60 tablet 3   triamcinolone  ointment (KENALOG ) 0.5 % Apply 1 Application topically 2 (two) times daily. 30 g 1   PAIN RELIEVING LIDOCAINE  EX Apply 1 Application topically daily. (Patient not taking: Reported on 02/10/2024)     Pancrelipase , Lip-Prot-Amyl, (ZENPEP ) 16109-604540 units CPEP Take 60,000 units of lipase by mouth 3 (three) times daily with meals. (1 capsule with meals three times a day and 1 with snacks). (Patient not taking: Reported on 02/10/2024) 180 capsule 3   No current  facility-administered medications for this visit.    Allergies as of 02/10/2024   (No Known Allergies)    Family History  Problem Relation Age of Onset   Heart attack Paternal Grandfather    Heart attack Paternal Grandmother    Ovarian cancer Maternal Grandmother    Diabetes Father    Heart attack Father 66   Stroke Father    Diabetes Mother    Uterine cancer Mother    Cancer Brother    Cirrhosis Brother        alcohol   Stroke Sister    Heart attack Paternal Uncle    Bone cancer Paternal Uncle    Colon cancer Neg Hx        unknown   Colon polyps Neg Hx        unknown    Social History   Socioeconomic History   Marital status: Legally Separated    Spouse name: Not on file   Number of children: 5   Years of education: Not on file   Highest education level: Not on file  Occupational History   Not on file  Tobacco Use   Smoking status: Every Day    Current packs/day: 1.00    Average packs/day: 1 pack/day for 32.0 years (32.0 ttl pk-yrs)    Types: Cigarettes   Smokeless tobacco: Never   Tobacco comments:    Since age 43.  Vaping Use   Vaping status: Never Used  Substance and Sexual Activity   Alcohol use: Not Currently   Drug use: No   Sexual activity: Not Currently    Partners: Male    Birth control/protection: Post-menopausal  Other Topics Concern   Not on file  Social History Narrative   Not on file   Social Drivers of Health   Financial Resource Strain: Medium Risk (12/12/2023)   Overall Financial Resource Strain (CARDIA)    Difficulty of Paying Living Expenses: Somewhat hard  Food Insecurity: No Food Insecurity (12/12/2023)   Hunger Vital Sign    Worried About Running Out of Food in the Last Year: Never true    Ran Out of Food in the Last Year: Never true  Transportation Needs: No Transportation Needs (12/12/2023)   PRAPARE - Administrator, Civil Service (Medical): No    Lack of Transportation (Non-Medical): No  Physical Activity:  Insufficiently Active (12/12/2023)   Exercise Vital  Sign    Days of Exercise per Week: 1 day    Minutes of Exercise per Session: 10 min  Stress: Stress Concern Present (12/12/2023)   [employer] of Occupational Health - Occupational Stress Questionnaire    Feeling of Stress : To some extent  Social Connections: Socially Isolated (12/12/2023)   Social Connection and Isolation Panel [NHANES]    Frequency of Communication with Friends and Family: More than three times a week    Frequency of Social Gatherings with Friends and Family: Once a week    Attends Religious Services: Never    Database administrator or Organizations: No    Attends Banker Meetings: Never    Marital Status: Separated  Intimate Partner Violence: Not At Risk (12/12/2023)   Humiliation, Afraid, Rape, and Kick questionnaire    Fear of Current or Ex-Partner: No    Emotionally Abused: No    Physically Abused: No    Sexually Abused: No     Review of Systems   Gen: Denies any fever, chills, fatigue, weight loss, lack of appetite.  CV: Denies chest pain, heart palpitations, peripheral edema, syncope.  Resp: Denies shortness of breath at rest or with exertion. Denies wheezing or cough.  GI: Denies dysphagia or odynophagia. Denies jaundice, hematemesis, fecal incontinence. GU : Denies urinary burning, urinary frequency, urinary hesitancy MS: Denies joint pain, muscle weakness, cramps, or limitation of movement.  Derm: Denies rash, itching, dry skin Psych: Denies depression, anxiety, memory loss, and confusion Heme: Denies bruising, bleeding, and enlarged lymph nodes.   Physical Exam   BP (!) 218/82 (BP Location: Left Arm, Patient Position: Sitting, Cuff Size: Normal)   Pulse 78   Temp (!) 97.1 F (36.2 C) (Temporal)   Ht 5\' 4"  (1.626 m)   Wt 143 lb 9.6 oz (65.1 kg)   LMP  (LMP Unknown)   BMI 24.65 kg/m  General:   Alert and oriented. Pleasant and cooperative. Well-nourished and well-developed.   Head:  Normocephalic and atraumatic. Eyes:  Without icterus Abdomen:  +BS, soft, non-tender and non-distended. No HSM noted. No guarding or rebound. No masses appreciated.  Rectal:  anoscopy completed. Possible small internal hemorrhoids but more so irritated tissue that seemed circumferential internally and query rectal prolapse.  Msk:  Symmetrical without gross deformities. Normal posture. Extremities:  Without edema. Neurologic:  Alert and  oriented x4;  grossly normal neurologically. Skin:  Intact without significant lesions or rashes. Psych:  Alert and cooperative. Normal mood and affect.   Assessment   Misty Mccullough is a 53 y.o. 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   Chronic diarrhea: negative celiac serologies, patent vasculature on CTA last year, negative colonic biopsies on colonoscopy last year, no alarm signs/symptoms currently, notably improved with Zenpep . Will continue on this.   MASH: with concern for cirrhosis. Early cirrhosis on elastography but IQR ratio was elevated at 0.63, so this is reduced accuracy. CTA recently with cirrhosis and splenomegaly.  No varices on recent EGD; platelets normal. She does have splenomegaly. NAFLD score indeterminate. ELF high at 12.16.  Serologies completed July 2024. We have enrolled her in hepatoma care. AFP tumor marker normal in Feb 2025. Can consider updating screening EGD if platelets below 150k or decompensated.   Rectal mucus: suspect r/t internal hemorrhoids and possible rectal prolapse. As not severe, would avoid surgical referral for now (initially had discussed  this with patient but decided would be best to follow clinically). With possible cirrhosis, banding is not straightforward. She does not have varices on last EGD or thrombocytopenia, but she does have splenomegaly. Will have to discuss further with  attending.     BP initially 209/79, repeat 218/82, pulse 78. No chest pain or SOB. No blurred vision. I advised strongly ED evaluation., Hx of stroke and significant risk for morbidity/mortality if she did not seek care. She is declining ED evaluation. Also discussed with daughter, who spoke on phone with patient and recommended ED evaluation. Patient is of sound mind and declined.      PLAN   Stop dicyclomine , as this is not helpful Continue Zenpep  Recommend ED evaluation due to severe hypertension: patient declined Will discuss further possible banding; unclear candidacy   Delman Ferns, PhD, ANP-BC [facility]

## 2024-02-16 ENCOUNTER — Telehealth: Payer: Self-pay | Admitting: *Deleted

## 2024-02-16 ENCOUNTER — Encounter: Payer: Self-pay | Admitting: Nurse Practitioner

## 2024-02-16 ENCOUNTER — Ambulatory Visit (INDEPENDENT_AMBULATORY_CARE_PROVIDER_SITE_OTHER): Payer: Self-pay | Admitting: Nurse Practitioner

## 2024-02-16 VITALS — BP 175/60 | HR 80 | Temp 98.2°F | Wt 148.4 lb

## 2024-02-16 DIAGNOSIS — Z760 Encounter for issue of repeat prescription: Secondary | ICD-10-CM

## 2024-02-16 DIAGNOSIS — I10 Essential (primary) hypertension: Secondary | ICD-10-CM

## 2024-02-16 DIAGNOSIS — E118 Type 2 diabetes mellitus with unspecified complications: Secondary | ICD-10-CM

## 2024-02-16 DIAGNOSIS — R0602 Shortness of breath: Secondary | ICD-10-CM

## 2024-02-16 DIAGNOSIS — R059 Cough, unspecified: Secondary | ICD-10-CM

## 2024-02-16 LAB — POCT GLYCOSYLATED HEMOGLOBIN (HGB A1C): Hemoglobin A1C: 13.9 % — AB (ref 4.0–5.6)

## 2024-02-16 MED ORDER — ALBUTEROL SULFATE HFA 108 (90 BASE) MCG/ACT IN AERS: 2.0000 | INHALATION_SPRAY | Freq: Four times a day (QID) | RESPIRATORY_TRACT | 11 refills | Status: AC | PRN

## 2024-02-16 MED ORDER — CLONIDINE HCL 0.1 MG PO TABS: 0.1000 mg | ORAL_TABLET | Freq: Once | ORAL | Status: AC

## 2024-02-16 MED ORDER — CARVEDILOL 25 MG PO TABS: 25.0000 mg | ORAL_TABLET | Freq: Two times a day (BID) | ORAL | 3 refills | Status: AC

## 2024-02-16 MED ORDER — FLUTICASONE-SALMETEROL 100-50 MCG/ACT IN AEPB: 1.0000 | INHALATION_SPRAY | Freq: Two times a day (BID) | RESPIRATORY_TRACT | 1 refills | Status: AC

## 2024-02-16 MED ORDER — EMPAGLIFLOZIN 25 MG PO TABS: 25.0000 mg | ORAL_TABLET | Freq: Every day | ORAL | 2 refills | Status: AC

## 2024-02-16 MED ORDER — ALBUTEROL SULFATE (2.5 MG/3ML) 0.083% IN NEBU: 2.5000 mg | INHALATION_SOLUTION | RESPIRATORY_TRACT | 2 refills | Status: AC | PRN

## 2024-02-16 NOTE — Progress Notes (Signed)
 Subjective   Patient ID: Misty Mccullough, female    DOB: Mar 05, 1971, 53 y.o.   MRN: [id number]  Chief Complaint  Patient presents with   Diabetes    Follow up    Referring provider: Jerrlyn Morel, NP  Taesha Rochell is a 53 y.o. female with Past Medical History: No date: Bilateral lower extremity edema No date: CHF (congestive heart failure) (HCC) No date: COPD (chronic obstructive pulmonary disease) (HCC) No date: Cough No date: Diabetes mellitus without complication (HCC) No date: Gallstones No date: GERD (gastroesophageal reflux disease) No date: Heavy cigarette smoker 08/2019: Hemoglobin A1C between 7% and 9% indicating borderline  diabetic control (HCC) No date: Hypercholesteremia No date: Hyperglycemia No date: Hypertension No date: Shortness of breath No date: Stroke (cerebrum) (HCC) 12/2020: Vitamin D  deficiency   HPI  Patient presents today for follow-up visit.  Patient has followed with cardiology and did have a heart cath.  Pharmacy will follow with her to adjust her other diabetic medications and for hypertension.  Blood pressure was elevated in office today.  Per cardiology's last note patient supposed to be on carvedilol  25 mg twice daily but has not been taking this medication.  We will reorder this today.  Patient has ran out of Jardiance .  Her A1c was over 13 today in office.  We we will reorder for increased dosage on that.  She does need refills on inhalers today.  Denies f/c/s, n/v/d, hemoptysis, PND, leg swelling. Denies chest pain or edema.   Note: Blood pressure was elevated in office today.  Clonidine  was given during office visit today.  Advised patient to take carvedilol .  Will recheck blood pressure in office on Wednesday.  Discussed with patient if her blood pressure becomes more elevated or if she is symptomatic she does need to go to the emergency room this afternoon.   No Known Allergies  Immunization History  Administered Date(s) Administered    Influenza Whole 06/21/2011   Influenza, Seasonal, Injecte, Preservative Fre 06/20/2023   Influenza,inj,Quad PF,6+ Mos 09/06/2016, 08/17/2018, 08/25/2019, 06/29/2021, 11/15/2022   PFIZER Comirnaty(Gray Top)Covid-19 Tri-Sucrose Vaccine 01/15/2020, 02/09/2020   Pneumococcal Conjugate-13 08/25/2019   Pneumococcal Polysaccharide-23 12/06/2016   Pneumococcal-Unspecified 06/21/2011   Tdap 12/06/2016    Tobacco History: Social History   Tobacco Use  Smoking Status Every Day   Current packs/day: 1.00   Average packs/day: 1 pack/day for 32.0 years (32.0 ttl pk-yrs)   Types: Cigarettes  Smokeless Tobacco Never  Tobacco Comments   Since age 44.   Ready to quit: No Counseling given: Yes Tobacco comments: Since age 45.   Outpatient Encounter Medications as of 02/16/2024  Medication Sig   aspirin  EC 81 MG tablet Take 1 tablet (81 mg total) by mouth daily with breakfast. Swallow whole.   Aspirin -Caffeine 845-65 MG PACK Take 1 packet by mouth 3 (three) times daily as needed.   atorvastatin  (LIPITOR ) 80 MG tablet Take 1 tablet (80 mg total) by mouth daily.   Blood Pressure KIT 1 Units by Does not apply route daily.   carvedilol  (COREG ) 25 MG tablet Take 1 tablet (25 mg total) by mouth 2 (two) times daily with a meal.   cetirizine  (ZYRTEC ) 10 MG tablet Take 1 tablet (10 mg total) by mouth daily.   cyanocobalamin  (VITAMIN B12) 1000 MCG tablet Take 1,000 mcg by mouth daily.   dicyclomine  (BENTYL ) 10 MG capsule Take 1 capsule (10 mg total) by mouth 4 (four) times daily -  before meals and at bedtime.  empagliflozin  (JARDIANCE ) 25 MG TABS tablet Take 1 tablet (25 mg total) by mouth daily before breakfast.   ezetimibe  (ZETIA ) 10 MG tablet Take 1 tablet (10 mg total) by mouth daily.   ferrous sulfate  (SV IRON ) 325 (65 FE) MG tablet Take 1 tablet (325 mg total) by mouth daily.   folic acid  (FOLVITE ) 800 MCG tablet Take 1 tablet by mouth once daily   furosemide  (LASIX ) 40 MG tablet Take 1 tablet (40 mg  total) by mouth daily.   hydrocortisone  (ANUSOL -HC) 2.5 % rectal cream Place 1 Application rectally 2 (two) times daily. For rectal discomfort   ibuprofen (ADVIL) 200 MG tablet Take 800 mg by mouth every 8 (eight) hours as needed (pain.).   insulin  glargine (LANTUS  SOLOSTAR) 100 UNIT/ML Solostar Pen Inject 10 Units into the skin daily.   lisinopril  (ZESTRIL ) 40 MG tablet Take 1 tablet (40 mg total) by mouth daily.   metFORMIN  (GLUCOPHAGE -XR) 500 MG 24 hr tablet Take 1 tablet (500 mg total) by mouth 2 (two) times daily with a meal.   Nebulizers (COMPRESSOR/NEBULIZER) MISC 1 Units by Does not apply route as needed.   nitroGLYCERIN  (NITROSTAT ) 0.4 MG SL tablet Place 1 tablet (0.4 mg total) under the tongue every 5 (five) minutes x 3 doses as needed for chest pain (If no relief after 3rd dose proceed to ED or call 911).   omeprazole  (PRILOSEC) 20 MG capsule Take 1 capsule (20 mg total) by mouth daily.   Pancrelipase , Lip-Prot-Amyl, (ZENPEP ) 16109-604540 units CPEP Take 60,000 units of lipase by mouth 3 (three) times daily with meals. (1 capsule with meals three times a day and 1 with snacks).   ranolazine  (RANEXA ) 500 MG 12 hr tablet Take 1 tablet (500 mg total) by mouth 2 (two) times daily.   triamcinolone  ointment (KENALOG ) 0.5 % Apply 1 Application topically 2 (two) times daily.   [DISCONTINUED] albuterol  (PROVENTIL ) (2.5 MG/3ML) 0.083% nebulizer solution Take 3 mLs (2.5 mg total) by nebulization every 4 (four) hours as needed for wheezing or shortness of breath.   [DISCONTINUED] albuterol  (VENTOLIN  HFA) 108 (90 Base) MCG/ACT inhaler Inhale 2 puffs into the lungs every 6 (six) hours as needed for wheezing or shortness of breath.   [DISCONTINUED] empagliflozin  (JARDIANCE ) 10 MG TABS tablet Take 1 tablet (10 mg total) by mouth daily before breakfast.   [DISCONTINUED] fluticasone -salmeterol (ADVAIR) 100-50 MCG/ACT AEPB Inhale 1 puff into the lungs 2 (two) times daily.   albuterol  (PROVENTIL ) (2.5 MG/3ML)  0.083% nebulizer solution Take 3 mLs (2.5 mg total) by nebulization every 4 (four) hours as needed for wheezing or shortness of breath.   albuterol  (VENTOLIN  HFA) 108 (90 Base) MCG/ACT inhaler Inhale 2 puffs into the lungs every 6 (six) hours as needed for wheezing or shortness of breath.   fluticasone -salmeterol (ADVAIR) 100-50 MCG/ACT AEPB Inhale 1 puff into the lungs 2 (two) times daily.   PAIN RELIEVING LIDOCAINE  EX Apply 1 Application topically daily. (Patient not taking: Reported on 02/10/2024)   Facility-Administered Encounter Medications as of 02/16/2024  Medication   cloNIDine  (CATAPRES ) tablet 0.1 mg    Review of Systems  Review of Systems  Constitutional: Negative.   HENT: Negative.    Cardiovascular: Negative.   Gastrointestinal: Negative.   Allergic/Immunologic: Negative.   Neurological: Negative.   Psychiatric/Behavioral: Negative.       Objective:   BP (!) 166/61   Pulse 80   Temp 98.2 F (36.8 C) (Oral)   Wt 148 lb 6.4 oz (67.3 kg)   LMP  (  LMP Unknown)   SpO2 97%   BMI 25.47 kg/m   Wt Readings from Last 5 Encounters:  02/16/24 148 lb 6.4 oz (67.3 kg)  02/10/24 143 lb 9.6 oz (65.1 kg)  12/12/23 154 lb 8 oz (70.1 kg)  11/20/23 148 lb 9.6 oz (67.4 kg)  09/22/23 137 lb 6.4 oz (62.3 kg)     Physical Exam Vitals and nursing note reviewed.  Constitutional:      General: She is not in acute distress.    Appearance: She is well-developed.  Cardiovascular:     Rate and Rhythm: Normal rate and regular rhythm.  Pulmonary:     Effort: Pulmonary effort is normal.     Breath sounds: Normal breath sounds.  Neurological:     Mental Status: She is alert and oriented to person, place, and time.       Assessment & Plan:   Essential hypertension -     AMB Referral [facility] Care Management -     cloNIDine  HCl  Diabetes mellitus with complication (HCC) -     Microalbumin / creatinine urine ratio -     POCT glycosylated hemoglobin (Hb A1C) -     AMB Referral [facility]I  Care Management -     Empagliflozin ; Take 1 tablet (25 mg total) by mouth daily before breakfast.  Dispense: 30 tablet; Refill: 2  Shortness of breath -     Albuterol  Sulfate HFA; Inhale 2 puffs into the lungs every 6 (six) hours as needed for wheezing or shortness of breath.  Dispense: 1 each; Refill: 11  Cough -     Albuterol  Sulfate HFA; Inhale 2 puffs into the lungs every 6 (six) hours as needed for wheezing or shortness of breath.  Dispense: 1 each; Refill: 11  Medication refill -     Albuterol  Sulfate HFA; Inhale 2 puffs into the lungs every 6 (six) hours as needed for wheezing or shortness of breath.  Dispense: 1 each; Refill: 11  Other orders -     Albuterol  Sulfate; Take 3 mLs (2.5 mg total) by nebulization every 4 (four) hours as needed for wheezing or shortness of breath.  Dispense: 120 mL; Refill: 2 -     Fluticasone -Salmeterol; Inhale 1 puff into the lungs 2 (two) times daily.  Dispense: 180 each; Refill: 1 -     Carvedilol ; Take 1 tablet (25 mg total) by mouth 2 (two) times daily with a meal.  Dispense: 60 tablet; Refill: 3     Return in about 3 months (around 05/18/2024).   Jerrlyn Morel, NP 02/16/2024

## 2024-02-16 NOTE — Progress Notes (Unsigned)
 Care Guide Pharmacy Note  02/16/2024 Name: Misty Mccullough MRN: [id number] DOB: [birth year]  Referred By: Jerrlyn Morel, NP Reason for referral: Complex Care Management (Initial outreach to schedule referral with PharmD  )   Misty Mccullough is a 53 y.o. year old female who is a primary care patient of Jerrlyn Morel, NP.  Misty Mccullough was referred to the pharmacist for assistance related to: HTN and DMII  An unsuccessful telephone outreach was attempted today to contact the patient who was referred to the pharmacy team for assistance with medication management. Additional attempts will be made to contact the patient.  Barnie Bora  [facility]  [facility] Guide  Direct Dial: [phone]  Fax [phone]

## 2024-02-17 LAB — MICROALBUMIN / CREATININE URINE RATIO
Creatinine, Urine: 20 mg/dL
Microalb/Creat Ratio: 10993 mg/g{creat} — ABNORMAL HIGH (ref 0–29)
Microalbumin, Urine: 2198.5 ug/mL

## 2024-02-17 NOTE — Progress Notes (Unsigned)
 Care Guide Pharmacy Note  02/17/2024 Name: Misty Mccullough MRN: [id number] DOB: Apr 19, 1971  Referred By: Jerrlyn Morel, NP Reason for referral: Complex Care Management (Initial outreach to schedule referral with PharmD  )   Misty Mccullough is a 53 y.o. year old female who is a primary care patient of Jerrlyn Morel, NP.  Misty Mccullough was referred to the pharmacist for assistance related to: HTN and DMII  A second unsuccessful telephone outreach was attempted today to contact the patient who was referred to the pharmacy team for assistance with medication management. Additional attempts will be made to contact the patient.  Misty Mccullough  [facility]  [facility], [facility] Guide  Direct Dial: [phone]  Fax [phone]

## 2024-02-18 ENCOUNTER — Other Ambulatory Visit: Payer: Self-pay | Admitting: Nurse Practitioner

## 2024-02-18 ENCOUNTER — Ambulatory Visit: Payer: Self-pay | Admitting: Nurse Practitioner

## 2024-02-18 ENCOUNTER — Other Ambulatory Visit: Payer: Self-pay

## 2024-02-18 ENCOUNTER — Telehealth: Payer: Self-pay | Admitting: Nurse Practitioner

## 2024-02-18 DIAGNOSIS — I10 Essential (primary) hypertension: Secondary | ICD-10-CM

## 2024-02-18 DIAGNOSIS — N289 Disorder of kidney and ureter, unspecified: Secondary | ICD-10-CM

## 2024-02-18 NOTE — Progress Notes (Signed)
Amb ref

## 2024-02-18 NOTE — Telephone Encounter (Signed)
 Copied from CRM [id number]. Topic: General - Other >> Feb 18, 2024  8:07 AM Misty Mccullough wrote: Reason for CRM:  Patient is calling in to cancel her appointment, but wanted to relay note about blood pressure, states she checked her BP at home this morning, and it was "160" she states, her meds are working.

## 2024-02-18 NOTE — Progress Notes (Signed)
 Care Guide Pharmacy Note  02/18/2024 Name: Misty Mccullough MRN: [id number] DOB: 1971-09-30  Referred By: Jerrlyn Morel, NP Reason for referral: Complex Care Management (Initial outreach to schedule referral with PharmD  )   Misty Mccullough is a 53 y.o. year old female who is a primary care patient of Jerrlyn Morel, NP.  Misty Mccullough was referred to the pharmacist for assistance related to: HTN and DMII  A third unsuccessful telephone outreach was attempted today to contact the patient who was referred to the pharmacy team for assistance with medication management. The Population Health team is pleased to engage with this patient at any time in the future upon receipt of referral and should he/she be interested in assistance from the Population Health team.  Misty Mccullough  [facility]  [facility], [facility] Guide  Direct Dial: [phone]  Fax [phone]

## 2024-03-02 ENCOUNTER — Encounter: Payer: Self-pay | Admitting: Emergency Medicine

## 2024-03-16 ENCOUNTER — Ambulatory Visit: Payer: Self-pay | Admitting: Nurse Practitioner

## 2024-03-17 ENCOUNTER — Telehealth: Payer: Self-pay | Admitting: Nurse Practitioner

## 2024-03-17 NOTE — Telephone Encounter (Signed)
 Copied from CRM #[id number]. Topic: General - Other >> Mar 17, 2024  3:37 PM Sophia H wrote: Reason for CRM: [phone] [facility] - calling in reference to updated records that were requested on behalf of the patient, looks like it was received on 06/06. Advised to allow more time, can take up to 30 days, states has been sending requests over for a few months now. Needing records from 08/08/2023 - present. Please reach out if there are any other questions. Ty    06/05 - most recent request date  2/28 requested

## 2024-03-19 ENCOUNTER — Other Ambulatory Visit: Payer: Self-pay | Admitting: Nurse Practitioner

## 2024-03-19 DIAGNOSIS — I10 Essential (primary) hypertension: Secondary | ICD-10-CM

## 2024-03-19 DIAGNOSIS — Z760 Encounter for issue of repeat prescription: Secondary | ICD-10-CM

## 2024-04-28 ENCOUNTER — Ambulatory Visit: Payer: Self-pay | Admitting: Nurse Practitioner

## 2024-05-24 ENCOUNTER — Other Ambulatory Visit: Payer: Self-pay
